# Patient Record
Sex: Male | Born: 1955 | Race: Black or African American | Hispanic: No | Marital: Single | Smoking: Former smoker
Health system: Southern US, Community
[De-identification: ages and names within clinical notes are randomized; demographics above are authoritative.]

## PROBLEM LIST (undated history)

## (undated) DIAGNOSIS — K769 Liver disease, unspecified: Secondary | ICD-10-CM

## (undated) DIAGNOSIS — R569 Unspecified convulsions: Secondary | ICD-10-CM

## (undated) DIAGNOSIS — K219 Gastro-esophageal reflux disease without esophagitis: Secondary | ICD-10-CM

## (undated) DIAGNOSIS — F509 Eating disorder, unspecified: Secondary | ICD-10-CM

## (undated) DIAGNOSIS — G4730 Sleep apnea, unspecified: Secondary | ICD-10-CM

## (undated) DIAGNOSIS — J449 Chronic obstructive pulmonary disease, unspecified: Secondary | ICD-10-CM

## (undated) HISTORY — DX: Sleep apnea, unspecified: G47.30

## (undated) HISTORY — DX: Gastro-esophageal reflux disease without esophagitis: K21.9

## (undated) HISTORY — DX: Chronic obstructive pulmonary disease, unspecified: J44.9

## (undated) HISTORY — PX: SKIN BIOPSY: SHX1

## (undated) HISTORY — DX: Eating disorder, unspecified: F50.9

## (undated) HISTORY — DX: Unspecified convulsions: R56.9

## (undated) HISTORY — DX: Liver disease, unspecified: K76.9

---

## 2000-07-11 ENCOUNTER — Encounter: Payer: Self-pay | Admitting: Gastroenterology

## 2000-07-11 ENCOUNTER — Encounter: Admission: RE | Admit: 2000-07-11 | Discharge: 2000-07-11 | Payer: Self-pay | Admitting: Gastroenterology

## 2007-02-05 ENCOUNTER — Other Ambulatory Visit: Payer: Self-pay

## 2007-02-05 ENCOUNTER — Emergency Department: Payer: Self-pay | Admitting: Emergency Medicine

## 2007-08-22 ENCOUNTER — Emergency Department: Payer: Self-pay | Admitting: Emergency Medicine

## 2007-12-18 ENCOUNTER — Ambulatory Visit: Payer: Self-pay | Admitting: Neurology

## 2013-11-03 ENCOUNTER — Encounter: Payer: Self-pay | Admitting: *Deleted

## 2013-11-16 ENCOUNTER — Ambulatory Visit: Payer: Self-pay | Admitting: General Surgery

## 2013-11-24 ENCOUNTER — Ambulatory Visit (INDEPENDENT_AMBULATORY_CARE_PROVIDER_SITE_OTHER): Payer: Medicaid Other | Admitting: General Surgery

## 2013-11-24 ENCOUNTER — Encounter: Payer: Self-pay | Admitting: General Surgery

## 2013-11-24 VITALS — BP 140/78 | HR 84 | Resp 12 | Ht 66.0 in | Wt 142.0 lb

## 2013-11-24 DIAGNOSIS — L723 Sebaceous cyst: Secondary | ICD-10-CM

## 2013-11-24 DIAGNOSIS — L729 Follicular cyst of the skin and subcutaneous tissue, unspecified: Secondary | ICD-10-CM

## 2013-11-24 NOTE — Progress Notes (Signed)
Patient ID: Jeremiah Nguyen, male   DOB: 08/24/1955, 59 y.o.   MRN: [id number]  Chief Complaint  Patient presents with  . Other    Evaluation of left leg cyst    HPI Jeremiah Nguyen is a 58 y.o. male who presents for an evaluation of a left leg cyst. The patient states the cyst has been present for approximately 1-2 years. It was drained approximately 1 year ago. He denies any pain in this location. The area has gotten just as large as it was prior to it being drained 1 year ago.   HPI  Past Medical History  Diagnosis Date  . GERD (gastroesophageal reflux disease)   . Liver disease   . Seizures     History reviewed. No pertinent past surgical history.  Family History  Problem Relation Age of Onset  . Cancer Mother     breast    Social History History  Substance Use Topics  . Smoking status: Current Every Day Smoker -- 1.00 packs/day for 30 years  . Smokeless tobacco: Never Used  . Alcohol Use: No    No Known Allergies  Current Outpatient Prescriptions  Medication Sig Dispense Refill  . acetaminophen (TYLENOL) 325 MG tablet Take 325 mg by mouth every 4 (four) hours as needed.      . baclofen (LIORESAL) 10 MG tablet Take 10 mg by mouth 2 (two) times daily.      Marland Kitchen buPROPion (WELLBUTRIN XL) 150 MG 24 hr tablet Take 150 mg by mouth daily.      . chlorproMAZINE (THORAZINE) 50 MG tablet Take 50 mg by mouth 4 (four) times daily as needed.      Marland Kitchen omeprazole (PRILOSEC) 20 MG capsule Take 20 mg by mouth 2 (two) times daily before a meal.      . PHENobarbital (LUMINAL) 64.8 MG tablet Take 64.8 mg by mouth at bedtime.      . phenytoin (DILANTIN) 100 MG ER capsule Take 200 mg by mouth 2 (two) times daily.      . sennosides-docusate sodium (SENOKOT-S) 8.6-50 MG tablet Take 1 tablet by mouth 2 (two) times daily.       No current facility-administered medications for this visit.    Review of Systems Review of Systems  Constitutional: Negative.   Respiratory: Negative.     Cardiovascular: Negative.     Blood pressure 140/78, pulse 84, resp. rate 12, height 5\' 6"  (1.676 m), weight 142 lb (64.411 kg).  Physical Exam Physical Exam  Constitutional: He is oriented to person, place, and time. He appears well-developed and well-nourished. No distress.  Neck: Neck supple.  Cardiovascular: Normal rate and regular rhythm.   Pulmonary/Chest: Effort normal and breath sounds normal. No respiratory distress.  Musculoskeletal:       Legs: Neurological: He is alert and oriented to person, place, and time.    Data Reviewed PCP notes  Assessment    Recurring and enlarging skin cyst in lateral aspect of L knee.       Plan    Recommended excision.  May need developing skin flaps to allow adequate closer.  Discussed with patient and his care giver.  They are agreeable.        Jenavie Stanczak G 11/25/2013, 6:15 AM

## 2013-11-25 ENCOUNTER — Encounter: Payer: Self-pay | Admitting: General Surgery

## 2013-11-25 ENCOUNTER — Telehealth: Payer: Self-pay | Admitting: *Deleted

## 2013-11-25 NOTE — Patient Instructions (Signed)
Excision of skin cyst to be done in SDS

## 2013-11-25 NOTE — Telephone Encounter (Signed)
Patient's caregiver, Junious Dresser, called to arrange surgery date for the patient. This patient's surgery has been scheduled for 12-15-13 at [facility]. Junious Dresser was instructed to call the office if they have further questions.   Caregiver reports that patient has no POA and signs all paperwork for himself.

## 2013-11-26 ENCOUNTER — Other Ambulatory Visit: Payer: Self-pay | Admitting: General Surgery

## 2013-11-26 DIAGNOSIS — L729 Follicular cyst of the skin and subcutaneous tissue, unspecified: Secondary | ICD-10-CM

## 2013-12-08 ENCOUNTER — Ambulatory Visit: Payer: Self-pay | Admitting: General Surgery

## 2013-12-08 LAB — CBC WITH DIFFERENTIAL/PLATELET
BASOS ABS: 0.1 10*3/uL (ref 0.0–0.1)
Basophil %: 1.5 %
EOS ABS: 0.3 10*3/uL (ref 0.0–0.7)
Eosinophil %: 4.4 %
HCT: 44.5 % (ref 40.0–52.0)
HGB: 14.3 g/dL (ref 13.0–18.0)
Lymphocyte #: 1.4 10*3/uL (ref 1.0–3.6)
Lymphocyte %: 23.9 %
MCH: 30.4 pg (ref 26.0–34.0)
MCHC: 32.2 g/dL (ref 32.0–36.0)
MCV: 94 fL (ref 80–100)
MONOS PCT: 7.7 %
Monocyte #: 0.5 x10 3/mm (ref 0.2–1.0)
NEUTROS PCT: 62.5 %
Neutrophil #: 3.7 10*3/uL (ref 1.4–6.5)
PLATELETS: 237 10*3/uL (ref 150–440)
RBC: 4.71 10*6/uL (ref 4.40–5.90)
RDW: 14.8 % — ABNORMAL HIGH (ref 11.5–14.5)
WBC: 5.9 10*3/uL (ref 3.8–10.6)

## 2013-12-15 ENCOUNTER — Encounter: Payer: Self-pay | Admitting: General Surgery

## 2013-12-15 ENCOUNTER — Ambulatory Visit: Payer: Self-pay | Admitting: General Surgery

## 2013-12-15 DIAGNOSIS — L723 Sebaceous cyst: Secondary | ICD-10-CM

## 2013-12-15 HISTORY — PX: LESION EXCISION: SHX5167

## 2013-12-17 LAB — PATHOLOGY REPORT

## 2013-12-21 ENCOUNTER — Encounter: Payer: Self-pay | Admitting: General Surgery

## 2013-12-29 ENCOUNTER — Ambulatory Visit: Payer: Medicaid Other | Admitting: General Surgery

## 2014-01-06 ENCOUNTER — Encounter: Payer: Self-pay | Admitting: General Surgery

## 2014-01-06 ENCOUNTER — Ambulatory Visit (INDEPENDENT_AMBULATORY_CARE_PROVIDER_SITE_OTHER): Payer: Self-pay | Admitting: General Surgery

## 2014-01-06 VITALS — BP 136/66 | HR 88 | Resp 16 | Ht 66.0 in | Wt 144.0 lb

## 2014-01-06 DIAGNOSIS — L723 Sebaceous cyst: Secondary | ICD-10-CM

## 2014-01-06 DIAGNOSIS — L729 Follicular cyst of the skin and subcutaneous tissue, unspecified: Secondary | ICD-10-CM

## 2014-01-06 NOTE — Progress Notes (Signed)
This is a 58 year old male here today for his post op left leg excision done on 12/15/13. Patient states he is doing well. Incision healing well. Sutures removed. Steri strips applied. Pathology report, benign follicular cyst.

## 2014-01-06 NOTE — Patient Instructions (Signed)
As needed

## 2014-01-08 ENCOUNTER — Encounter: Payer: Self-pay | Admitting: General Surgery

## 2014-09-11 NOTE — Op Note (Signed)
PATIENT NAME:  Jeremiah Nguyen, Jeremiah Nguyen MR#:  [id number] DATE OF BIRTH:  03/24/1956  DATE OF PROCEDURE:  12/15/2013  PREOPERATIVE DIAGNOSIS: Large skin cyst, left leg, just below the knee, lateral.   POSTOPERATIVE DIAGNOSIS: Large skin cyst, left leg, just below the knee, lateral.   PROCEDURE: Excision of large skin cyst with skin flaps and closure.   SURGEON: Mckinley Jewel, M.D.   ANESTHESIA: Monitored care, local anesthetic of 0.5% Marcaine mixed with 1% Xylocaine,  total of 13 mL.   COMPLICATIONS: None.   ESTIMATED BLOOD LOSS: Minimal.   DRAINS: None.   DESCRIPTION OF PROCEDURE: This patient was placed in the supine position on the operating room table and with adequate sedation and monitoring he was positioned with a wedge on the back and tilted to the right side. The left knee and upper leg area were then prepped and draped out as a sterile field. The large skin cyst measured about 3.5 to 4 cm size and was fairly tense given the location being just over the fibula region. A vertical elliptical incision was mapped out. Timeout was performed. The area was infiltrated with local anesthetic all around to obtain an adequate block and to allow for skin flaps. The incision was then made and the entire cystic structure was excised out completely from the underlying fascia. The patient had a little more subcutaneous tissue posteriorly and a little anteriorly. After the lesion was excised out, it left a defect of about 5 cm long and about 3 cm in anteroposterior direction. The skin and subcutaneous tissue were then elevated on both sides for about 3 to 4 cm using cautery to control bleeding. This was adequately mobilized on both sides. The skin edges could be brought together with the least amount of tension. Subcutaneous tissue was then closed with interrupted 2-0 Vicryl stitches, and the skin was approximated with interrupted vertical sutures of 4-0 nylon. Bacitracin ointment, Telfa, dry gauze and Kling were  applied. The patient subsequently was returned to the recovery room in stable condition.    ____________________________ S.Robinette Haines, MD sgs:sb D: 12/15/2013 08:50:25 ET T: 12/15/2013 09:16:23 ET JOB#: [id number]  cc: Synthia Innocent. Jamal Collin, MD, <Dictator> [facility] Robinette Haines MD ELECTRONICALLY SIGNED 12/16/2013 11:02

## 2014-11-25 ENCOUNTER — Other Ambulatory Visit: Payer: Self-pay

## 2014-11-25 ENCOUNTER — Encounter: Payer: Self-pay | Admitting: *Deleted

## 2014-11-25 ENCOUNTER — Inpatient Hospital Stay
Admission: EM | Admit: 2014-11-25 | Discharge: 2014-11-27 | DRG: 871 | Disposition: A | Discharge status: Home / Self Care | Payer: Medicaid Other | Attending: Internal Medicine | Admitting: Internal Medicine

## 2014-11-25 ENCOUNTER — Emergency Department: Payer: Medicaid Other

## 2014-11-25 DIAGNOSIS — T380X5A Adverse effect of glucocorticoids and synthetic analogues, initial encounter: Secondary | ICD-10-CM | POA: Diagnosis present

## 2014-11-25 DIAGNOSIS — A419 Sepsis, unspecified organism: Secondary | ICD-10-CM | POA: Diagnosis not present

## 2014-11-25 DIAGNOSIS — Z79899 Other long term (current) drug therapy: Secondary | ICD-10-CM

## 2014-11-25 DIAGNOSIS — R740 Nonspecific elevation of levels of transaminase and lactic acid dehydrogenase [LDH]: Secondary | ICD-10-CM | POA: Diagnosis present

## 2014-11-25 DIAGNOSIS — J9601 Acute respiratory failure with hypoxia: Secondary | ICD-10-CM | POA: Diagnosis present

## 2014-11-25 DIAGNOSIS — K769 Liver disease, unspecified: Secondary | ICD-10-CM | POA: Diagnosis present

## 2014-11-25 DIAGNOSIS — Z720 Tobacco use: Secondary | ICD-10-CM | POA: Diagnosis present

## 2014-11-25 DIAGNOSIS — G40909 Epilepsy, unspecified, not intractable, without status epilepticus: Secondary | ICD-10-CM | POA: Diagnosis present

## 2014-11-25 DIAGNOSIS — J441 Chronic obstructive pulmonary disease with (acute) exacerbation: Secondary | ICD-10-CM | POA: Diagnosis present

## 2014-11-25 DIAGNOSIS — K219 Gastro-esophageal reflux disease without esophagitis: Secondary | ICD-10-CM | POA: Diagnosis present

## 2014-11-25 DIAGNOSIS — F329 Major depressive disorder, single episode, unspecified: Secondary | ICD-10-CM | POA: Diagnosis present

## 2014-11-25 DIAGNOSIS — F17210 Nicotine dependence, cigarettes, uncomplicated: Secondary | ICD-10-CM | POA: Diagnosis present

## 2014-11-25 DIAGNOSIS — Z716 Tobacco abuse counseling: Secondary | ICD-10-CM | POA: Diagnosis present

## 2014-11-25 DIAGNOSIS — J189 Pneumonia, unspecified organism: Secondary | ICD-10-CM

## 2014-11-25 HISTORY — DX: Sepsis, unspecified organism: A41.9

## 2014-11-25 LAB — URINALYSIS COMPLETE WITH MICROSCOPIC (ARMC ONLY)
Bacteria, UA: NONE SEEN
Bilirubin Urine: NEGATIVE
Glucose, UA: NEGATIVE mg/dL
LEUKOCYTES UA: NEGATIVE
Nitrite: NEGATIVE
PH: 5 (ref 5.0–8.0)
Protein, ur: 30 mg/dL — AB
Specific Gravity, Urine: 1.017 (ref 1.005–1.030)

## 2014-11-25 LAB — COMPREHENSIVE METABOLIC PANEL
ALT: 135 U/L — ABNORMAL HIGH (ref 17–63)
ANION GAP: 14 (ref 5–15)
AST: 150 U/L — ABNORMAL HIGH (ref 15–41)
Albumin: 3.7 g/dL (ref 3.5–5.0)
Alkaline Phosphatase: 151 U/L — ABNORMAL HIGH (ref 38–126)
BILIRUBIN TOTAL: 1.1 mg/dL (ref 0.3–1.2)
BUN: 11 mg/dL (ref 6–20)
CHLORIDE: 95 mmol/L — AB (ref 101–111)
CO2: 28 mmol/L (ref 22–32)
Calcium: 9.4 mg/dL (ref 8.9–10.3)
Creatinine, Ser: 0.93 mg/dL (ref 0.61–1.24)
GFR calc non Af Amer: >60 mL/min (ref >60)
Glucose, Bld: 97 mg/dL (ref 65–99)
POTASSIUM: 3.7 mmol/L (ref 3.5–5.1)
Sodium: 137 mmol/L (ref 135–145)
Total Protein: 9.2 g/dL — ABNORMAL HIGH (ref 6.5–8.1)

## 2014-11-25 LAB — CBC WITH DIFFERENTIAL/PLATELET
BASOS ABS: 0 10*3/uL (ref 0–0.1)
BASOS PCT: 0 %
Eosinophils Absolute: 0 10*3/uL (ref 0–0.7)
Eosinophils Relative: 0 %
HCT: 51.5 % (ref 40.0–52.0)
Hemoglobin: 17.2 g/dL (ref 13.0–18.0)
LYMPHS PCT: 6 %
Lymphs Abs: 1 10*3/uL (ref 1.0–3.6)
MCH: 29.7 pg (ref 26.0–34.0)
MCHC: 33.4 g/dL (ref 32.0–36.0)
MCV: 89 fL (ref 80.0–100.0)
Monocytes Absolute: 1.3 10*3/uL — ABNORMAL HIGH (ref 0.2–1.0)
Monocytes Relative: 8 %
NEUTROS ABS: 14 10*3/uL — AB (ref 1.4–6.5)
NEUTROS PCT: 86 %
Platelets: 388 10*3/uL (ref 150–440)
RBC: 5.79 MIL/uL (ref 4.40–5.90)
RDW: 14.9 % — AB (ref 11.5–14.5)
WBC: 16.4 10*3/uL — ABNORMAL HIGH (ref 3.8–10.6)

## 2014-11-25 LAB — LACTIC ACID, PLASMA
Lactic Acid, Venous: 1.5 mmol/L (ref 0.5–2.0)
Lactic Acid, Venous: 1.3 mmol/L (ref 0.5–2.0)

## 2014-11-25 LAB — ACETAMINOPHEN LEVEL: Acetaminophen (Tylenol), Serum: <10 ug/mL — ABNORMAL LOW (ref 10–30)

## 2014-11-25 MED ORDER — ACETAMINOPHEN 500 MG PO TABS
1000.0000 mg | ORAL_TABLET | ORAL | Status: AC
  Administered 2014-11-25: 1000 mg via ORAL
  Filled 2014-11-25: qty 2

## 2014-11-25 MED ORDER — BACLOFEN 10 MG PO TABS
10.0000 mg | ORAL_TABLET | Freq: Two times a day (BID) | ORAL | Status: DC
  Administered 2014-11-25 – 2014-11-27 (×4): 10 mg via ORAL
  Filled 2014-11-25 (×4): qty 1

## 2014-11-25 MED ORDER — IPRATROPIUM-ALBUTEROL 0.5-2.5 (3) MG/3ML IN SOLN
3.0000 mL | Freq: Four times a day (QID) | RESPIRATORY_TRACT | Status: DC
  Administered 2014-11-25 – 2014-11-27 (×6): 3 mL via RESPIRATORY_TRACT
  Filled 2014-11-25 (×8): qty 3

## 2014-11-25 MED ORDER — CEFTRIAXONE SODIUM IN DEXTROSE 20 MG/ML IV SOLN
1.0000 g | INTRAVENOUS | Status: DC
  Administered 2014-11-26: 1 g via INTRAVENOUS
  Filled 2014-11-25: qty 50

## 2014-11-25 MED ORDER — SENNOSIDES-DOCUSATE SODIUM 8.6-50 MG PO TABS
1.0000 | ORAL_TABLET | Freq: Two times a day (BID) | ORAL | Status: DC
  Administered 2014-11-25 – 2014-11-27 (×4): 1 via ORAL
  Filled 2014-11-25 (×5): qty 1

## 2014-11-25 MED ORDER — SODIUM CHLORIDE 0.9 % IV BOLUS (SEPSIS)
1000.0000 mL | Freq: Once | INTRAVENOUS | Status: AC
  Administered 2014-11-25: 1000 mL via INTRAVENOUS

## 2014-11-25 MED ORDER — PREDNISONE 50 MG PO TABS
50.0000 mg | ORAL_TABLET | Freq: Every day | ORAL | Status: DC
  Administered 2014-11-26 – 2014-11-27 (×2): 50 mg via ORAL
  Filled 2014-11-25 (×2): qty 1

## 2014-11-25 MED ORDER — DEXTROSE 5 % IV SOLN
500.0000 mg | Freq: Once | INTRAVENOUS | Status: AC
  Administered 2014-11-25: 500 mg via INTRAVENOUS
  Filled 2014-11-25: qty 500

## 2014-11-25 MED ORDER — CEFTRIAXONE SODIUM IN DEXTROSE 40 MG/ML IV SOLN
2.0000 g | Freq: Once | INTRAVENOUS | Status: AC
  Administered 2014-11-25: 2 g via INTRAVENOUS
  Filled 2014-11-25 (×2): qty 50

## 2014-11-25 MED ORDER — BUPROPION HCL ER (XL) 150 MG PO TB24
150.0000 mg | ORAL_TABLET | Freq: Every day | ORAL | Status: DC
  Administered 2014-11-26 – 2014-11-27 (×2): 150 mg via ORAL
  Filled 2014-11-25 (×3): qty 1

## 2014-11-25 MED ORDER — HEPARIN SODIUM (PORCINE) 5000 UNIT/ML IJ SOLN
5000.0000 [IU] | Freq: Three times a day (TID) | INTRAMUSCULAR | Status: DC
  Administered 2014-11-25 – 2014-11-27 (×5): 5000 [IU] via SUBCUTANEOUS
  Filled 2014-11-25 (×5): qty 1

## 2014-11-25 MED ORDER — PANTOPRAZOLE SODIUM 40 MG PO TBEC
40.0000 mg | DELAYED_RELEASE_TABLET | Freq: Every day | ORAL | Status: DC
  Administered 2014-11-25 – 2014-11-27 (×3): 40 mg via ORAL
  Filled 2014-11-25 (×3): qty 1

## 2014-11-25 MED ORDER — ACETAMINOPHEN 325 MG PO TABS: 650.0000 mg | ORAL_TABLET | Freq: Four times a day (QID) | ORAL | Status: DC | PRN

## 2014-11-25 MED ORDER — CEFTRIAXONE SODIUM IN DEXTROSE 20 MG/ML IV SOLN: 1.0000 g | Freq: Once | INTRAVENOUS | Status: DC

## 2014-11-25 MED ORDER — ACETAMINOPHEN 650 MG RE SUPP: 650.0000 mg | Freq: Four times a day (QID) | RECTAL | Status: DC | PRN

## 2014-11-25 MED ORDER — PHENOBARBITAL 32.4 MG PO TABS
64.8000 mg | ORAL_TABLET | Freq: Every day | ORAL | Status: DC
  Administered 2014-11-25 – 2014-11-26 (×2): 64.8 mg via ORAL
  Filled 2014-11-25: qty 1
  Filled 2014-11-25 (×2): qty 2

## 2014-11-25 MED ORDER — DEXTROSE 5 % IV SOLN
500.0000 mg | INTRAVENOUS | Status: DC
  Administered 2014-11-26: 500 mg via INTRAVENOUS
  Filled 2014-11-25 (×2): qty 500

## 2014-11-25 MED ORDER — MAGNESIUM HYDROXIDE 400 MG/5ML PO SUSP: 30.0000 mL | Freq: Every day | ORAL | Status: DC | PRN

## 2014-11-25 MED ORDER — IPRATROPIUM-ALBUTEROL 0.5-2.5 (3) MG/3ML IN SOLN
3.0000 mL | Freq: Once | RESPIRATORY_TRACT | Status: AC
  Administered 2014-11-25: 3 mL via RESPIRATORY_TRACT
  Filled 2014-11-25: qty 3

## 2014-11-25 MED ORDER — METHYLPREDNISOLONE SODIUM SUCC 125 MG IJ SOLR
125.0000 mg | INTRAMUSCULAR | Status: AC
  Administered 2014-11-25: 125 mg via INTRAVENOUS
  Filled 2014-11-25: qty 2

## 2014-11-25 MED ORDER — PHENYTOIN SODIUM EXTENDED 100 MG PO CAPS
200.0000 mg | ORAL_CAPSULE | Freq: Two times a day (BID) | ORAL | Status: DC
  Administered 2014-11-25 – 2014-11-27 (×4): 200 mg via ORAL
  Filled 2014-11-25 (×4): qty 2

## 2014-11-25 NOTE — ED Provider Notes (Signed)
[facility] Emergency Department Provider Note  ____________________________________________  Time seen: Approximately 4:19 PM  I have reviewed the triage vital signs and the nursing notes.   HISTORY  Chief Complaint Shortness of Breath    HPI Jeremiah Nguyen is a 59 y.o. male the history of seizure disorder and ongoing smoking. Per the patient and his cousin who is also his caregiver, he is been experiencing a fever since Friday. They discussed with Dr. Brynda Greathouse and his recommendation of come to the emergency room today because he is now feeling short of breath, having trouble breathing, is having fevers with a very productive cough.  He has not been any recent antibiotic. He has not a recent hospitalizations. He denies being in pain though he does feel short of breath, this improves when he is on oxygen.  Moderate shortness of breath. Associated fever.  No abdominal pain. No rash. No headache. No neck pain. No chest pain.  Past Medical History  Diagnosis Date  . GERD (gastroesophageal reflux disease)   . Liver disease   . Seizures     There are no active problems to display for this patient.   Past Surgical History  Procedure Laterality Date  . Lesion excision Left 5-36-14    follicular cyst  . Skin biopsy      Current Outpatient Rx  Name  Route  Sig  Dispense  Refill  . acetaminophen (TYLENOL) 325 MG tablet   Oral   Take 325 mg by mouth every 4 (four) hours as needed.         . baclofen (LIORESAL) 10 MG tablet   Oral   Take 10 mg by mouth 2 (two) times daily.         Marland Kitchen buPROPion (WELLBUTRIN XL) 150 MG 24 hr tablet   Oral   Take 150 mg by mouth daily.         . chlorproMAZINE (THORAZINE) 50 MG tablet   Oral   Take 50 mg by mouth 4 (four) times daily as needed.         Marland Kitchen omeprazole (PRILOSEC) 20 MG capsule   Oral   Take 20 mg by mouth 2 (two) times daily before a meal.         . PHENobarbital (LUMINAL) 64.8 MG tablet    Oral   Take 64.8 mg by mouth at bedtime.         . phenytoin (DILANTIN) 100 MG ER capsule   Oral   Take 200 mg by mouth 2 (two) times daily.         . sennosides-docusate sodium (SENOKOT-S) 8.6-50 MG tablet   Oral   Take 1 tablet by mouth 2 (two) times daily.           Allergies Review of patient's allergies indicates no known allergies.  Family History  Problem Relation Age of Onset  . Cancer Mother     breast    Social History History  Substance Use Topics  . Smoking status: Current Every Day Smoker -- 1.00 packs/day for 30 years  . Smokeless tobacco: Never Used  . Alcohol Use: No    Review of Systems Constitutional: See history of present illness Eyes: No visual changes. ENT: No sore throat. Cardiovascular: Denies chest pain. Respiratory: See history of present illness Gastrointestinal: No abdominal pain.  No nausea, no vomiting.  No diarrhea.  No constipation. Genitourinary: Negative for dysuria. Musculoskeletal: Negative for back pain. Skin: Negative for rash. Neurological: Negative for  headaches, focal weakness or numbness.  10-point ROS otherwise negative.  ____________________________________________   PHYSICAL EXAM:  VITAL SIGNS: ED Triage Vitals  Enc Vitals Group     BP 11/25/14 1559 128/71 mmHg     Pulse Rate 11/25/14 1559 129     Resp --      Temp 11/25/14 1559 102.9 F (39.4 C)     Temp Source 11/25/14 1559 Oral     SpO2 11/25/14 1559 84 %     Weight 11/25/14 1559 134 lb (60.782 kg)     Height 11/25/14 1559 5\' 6"  (1.676 m)     Head Cir --      Peak Flow --      Pain Score --      Pain Loc --      Pain Edu? --      Excl. in [place]? --     Constitutional: Alert and oriented. Moderately ill-appearing and slightly tachypnea. Eyes: Conjunctivae are normal. PERRL. EOMI. Head: Atraumatic. Nose: No congestion/rhinnorhea. Mouth/Throat: Mucous membranes are dry.  Oropharynx non-erythematous. Neck: No stridor.   Cardiovascular: Regular  but tachycardic Grossly normal heart sounds.  Good peripheral circulation. Respiratory: Slight tachypnea, increased work of breathing, faint wheezes throughout. Patient does display moderate increased work of breathing and mild use of accessory muscles. He does not appear to be fatigued. Gastrointestinal: Soft and nontender. No distention. No abdominal bruits. No CVA tenderness. Musculoskeletal: No lower extremity tenderness nor edema.  No joint effusions. Neurologic:  Normal speech and language. No gross focal neurologic deficits are appreciated. Speech is normal. No gait instability. Skin:  Skin is warm, dry and intact. No rash noted. Psychiatric: Mood and affect are normal. Speech and behavior are normal.  ____________________________________________   LABS (all labs ordered are listed, but only abnormal results are displayed)  Labs Reviewed  COMPREHENSIVE METABOLIC PANEL - Abnormal; Notable for the following:    Chloride 95 (*)    Total Protein 9.2 (*)    AST 150 (*)    ALT 135 (*)    Alkaline Phosphatase 151 (*)    All other components within normal limits  CBC WITH DIFFERENTIAL/PLATELET - Abnormal; Notable for the following:    WBC 16.4 (*)    RDW 14.9 (*)    Neutro Abs 14.0 (*)    Monocytes Absolute 1.3 (*)    All other components within normal limits  CULTURE, BLOOD (ROUTINE X 2)  CULTURE, BLOOD (ROUTINE X 2)  URINE CULTURE  LACTIC ACID, PLASMA  LACTIC ACID, PLASMA  URINALYSIS COMPLETEWITH MICROSCOPIC ([facility] ONLY)   ____________________________________________  EKG  ED ECG REPORT I, QUALE, MARK, the attending physician, personally viewed and interpreted this ECG.  Date: 11/25/2014 EKG Time: 1620 Rate: 125 Rhythm: Sinus tachycardia QRS Axis: normal Intervals: normal ST/T Wave abnormalities: normal Conduction Disutrbances: none Narrative Interpretation: tachycardia with a left anterior fascicular block, no acute ischemic  changes  ____________________________________________  RADIOLOGY  Final result by Rad Results In Interface (11/25/14 16:43:39)   Narrative:   CLINICAL DATA: Smoker with current history of COPD, presenting with recent onset of shortness of breath and fever.  EXAM: PORTABLE CHEST - 1 VIEW  COMPARISON: None.  FINDINGS: Cardiac silhouette mildly enlarged. Thoracic aorta tortuous and mildly atherosclerotic. Mildly prominent central pulmonary arteries. Patchy airspace opacities in both lung bases, left greater than right, and in the right upper lobe. No visible pleural effusions. No pneumothorax. Emphysematous changes in the upper lobes.  IMPRESSION: 1. Patchy pneumonia involving both lung bases left  greater than right, and involving the right upper lobe. 2. Cardiomegaly without evidence of pulmonary edema.    ____________________________________________   PROCEDURES  Procedure(s) performed: None  Critical Care performed: Yes, see critical care note(s)  CRITICAL CARE Performed by: Delman Kitten   Total critical care time: 35  Critical care time was exclusive of separately billable procedures and treating other patients.  Critical care was necessary to treat or prevent imminent or life-threatening deterioration.  Critical care was time spent personally by me on the following activities: development of treatment plan with patient and/or surrogate as well as nursing, discussions with consultants, evaluation of patient's response to treatment, examination of patient, obtaining history from patient or surrogate, ordering and performing treatments and interventions, ordering and review of laboratory studies, ordering and review of radiographic studies, pulse oximetry and re-evaluation of patient's condition.  Patient presents with signs and symptoms concerning for acute sepsis requiring emergent evaluation and early goal directed therapy including initiation of antibiotic's,  fluid resuscitation. ____________________________________________   INITIAL IMPRESSION / ASSESSMENT AND PLAN / ED COURSE  Pertinent labs & imaging results that were available during my care of the patient were reviewed by me and considered in my medical decision making (see chart for details).  Patient presents with respiratory symptoms, productive cough, fever and signs and symptoms concerning for possible sepsis. His EKG is reassuring he has no chest pain. The evaluation does not show any other source based on physical examination though I suspect he likely has some type of pneumonia or other acute respiratory infection. He is not having any risk factors for healthcare associated pneumonia. Based on his presentation I will initiate early antibiotic therapy as well as early goal directed therapy.  ----------------------------------------- 5:50 PM on 11/25/2014 -----------------------------------------  Hheart rate and work of breathing has improved. He is awake and alert, oxygen saturation is 93% on nasal cannula supplementation.  Discussed case with medicine service, Dr. Volanda Napoleon. Patient will be admitted to medical service. ____________________________________________   FINAL CLINICAL IMPRESSION(S) / ED DIAGNOSES  Final diagnoses:  Bilateral pneumonia  Sepsis, due to unspecified organism      Delman Kitten, MD 11/25/14 1751

## 2014-11-25 NOTE — ED Notes (Signed)
Pt ambulatory to triage.  Pt sent to er for eval of copd/short of breath/fever from dr Brynda Greathouse office.  Pt denies chest pain.  Pt from my family care home.  Pt alert.
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
## 2014-11-25 NOTE — Progress Notes (Signed)
ANTIBIOTIC CONSULT NOTE - INITIAL  Pharmacy Consult for Ceftriaxone, Azithromycin Indication: pneumonia  No Known Allergies  Patient Measurements: Height: 5\' 10"  (177.8 cm) Weight: 135 lb 6.4 oz (61.417 kg) IBW/kg (Calculated) : 73 Adjusted Body Weight:   Vital Signs: Temp: 98.6 F (37 C) (07/07 1947) Temp Source: Oral (07/07 1947) BP: 114/64 mmHg (07/07 1947) Pulse Rate: 102 (07/07 1947) Intake/Output from previous day:   Intake/Output from this shift: Total I/O In: 1500 [I.V.:1500] Out: -   Labs:  Recent Labs  11/25/14 1656  WBC 16.4*  HGB 17.2  PLT 388  CREATININE 0.93   Estimated Creatinine Clearance: 73.4 mL/min (by C-G formula based on Cr of 0.93). No results for input(s): VANCOTROUGH, VANCOPEAK, VANCORANDOM, GENTTROUGH, GENTPEAK, GENTRANDOM, TOBRATROUGH, TOBRAPEAK, TOBRARND, AMIKACINPEAK, AMIKACINTROU, AMIKACIN in the last 72 hours.   Microbiology: No results found for this or any previous visit (from the past 720 hour(s)).  Medical History: Past Medical History  Diagnosis Date  . GERD (gastroesophageal reflux disease)   . Liver disease   . Seizures     Medications:  Prescriptions prior to admission  Medication Sig Dispense Refill Last Dose  . acetaminophen (TYLENOL) 325 MG tablet Take 325 mg by mouth every 4 (four) hours as needed.   Taking  . baclofen (LIORESAL) 10 MG tablet Take 10 mg by mouth 2 (two) times daily.   Taking  . buPROPion (WELLBUTRIN XL) 150 MG 24 hr tablet Take 150 mg by mouth daily.   Taking  . chlorproMAZINE (THORAZINE) 50 MG tablet Take 50 mg by mouth 4 (four) times daily as needed.   Taking  . omeprazole (PRILOSEC) 20 MG capsule Take 20 mg by mouth 2 (two) times daily before a meal.   Taking  . PHENobarbital (LUMINAL) 64.8 MG tablet Take 64.8 mg by mouth at bedtime.   Taking  . phenytoin (DILANTIN) 100 MG ER capsule Take 200 mg by mouth 2 (two) times daily.   Taking  . sennosides-docusate sodium (SENOKOT-S) 8.6-50 MG tablet Take  1 tablet by mouth 2 (two) times daily.   Taking   Assessment: CrCl = 71.6 ml/min  Goal of Therapy:  resolution of infection  Plan:  Expected duration 7 days with resolution of temperature and/or normalization of WBC  Ceftriaxone 2 gm IV X 1 given in ED on 7/7 .  Will order ceftriaxone 1 gm IV Q24H to start 7/8 @ 18:00. Azithromycin 500 mg IV X 1 given in ED on 7/7 .  Will order azithromycin to start on 7/8 @ 18:00.   Ramani Riva D 11/25/2014,7:53 PM

## 2014-11-25 NOTE — ED Notes (Signed)
Code  Sepsis  Called  To  [facility] 

## 2014-11-26 LAB — BASIC METABOLIC PANEL
Anion gap: 8 (ref 5–15)
BUN: 10 mg/dL (ref 6–20)
CO2: 30 mmol/L (ref 22–32)
CREATININE: 0.7 mg/dL (ref 0.61–1.24)
Calcium: 8.5 mg/dL — ABNORMAL LOW (ref 8.9–10.3)
Chloride: 102 mmol/L (ref 101–111)
GFR calc non Af Amer: >60 mL/min (ref >60)
Glucose, Bld: 93 mg/dL (ref 65–99)
Potassium: 3.6 mmol/L (ref 3.5–5.1)
Sodium: 140 mmol/L (ref 135–145)

## 2014-11-26 LAB — CBC
HCT: 42.1 % (ref 40.0–52.0)
HEMOGLOBIN: 13.8 g/dL (ref 13.0–18.0)
MCH: 29.3 pg (ref 26.0–34.0)
MCHC: 32.9 g/dL (ref 32.0–36.0)
MCV: 89.1 fL (ref 80.0–100.0)
Platelets: 348 10*3/uL (ref 150–440)
RBC: 4.73 MIL/uL (ref 4.40–5.90)
RDW: 14.7 % — AB (ref 11.5–14.5)
WBC: 19.6 10*3/uL — ABNORMAL HIGH (ref 3.8–10.6)

## 2014-11-26 LAB — HEPATIC FUNCTION PANEL
ALK PHOS: 104 U/L (ref 38–126)
ALT: 101 U/L — AB (ref 17–63)
AST: 95 U/L — AB (ref 15–41)
Albumin: 2.9 g/dL — ABNORMAL LOW (ref 3.5–5.0)
BILIRUBIN DIRECT: 0.3 mg/dL (ref 0.1–0.5)
BILIRUBIN INDIRECT: 0.4 mg/dL (ref 0.3–0.9)
TOTAL PROTEIN: 7.1 g/dL (ref 6.5–8.1)
Total Bilirubin: 0.7 mg/dL (ref 0.3–1.2)

## 2014-11-26 LAB — PHENYTOIN LEVEL, TOTAL

## 2014-11-26 NOTE — Progress Notes (Signed)
[facility] [place] NAME: Malik Paar    MR#:  [id number]  DATE OF BIRTH:  06-18-1955  SUBJECTIVE:  CHIEF COMPLAINT:   Chief Complaint  Patient presents with  . Shortness of Breath   Pt. Here due to sepsis from suspected pneumonia.  A bit confused this a.m.  No fever overnight.  WBC count a bit elevated.    REVIEW OF SYSTEMS:    Review of Systems  Constitutional: Negative for fever and chills.  HENT: Negative for congestion and tinnitus.   Eyes: Negative for blurred vision and double vision.  Respiratory: Negative for cough, shortness of breath and wheezing.   Cardiovascular: Negative for chest pain, orthopnea and PND.  Gastrointestinal: Negative for nausea, vomiting, abdominal pain and diarrhea.  Genitourinary: Negative for dysuria and hematuria.  Neurological: Negative for dizziness, sensory change and focal weakness.    Nutrition: Heart Healthy Tolerating Diet: Yes   DRUG ALLERGIES:  No Known Allergies  VITALS:  Blood pressure 128/71, pulse 95, temperature 98.6 F (37 C), temperature source Oral, resp. rate 18, height 5\' 10"  (1.778 m), weight 61.417 kg (135 lb 6.4 oz), SpO2 84 %.  PHYSICAL EXAMINATION:   Physical Exam  GENERAL:  59 y.o.-year-old patient lying in the bed with no acute distress.  EYES: Pupils equal, round, reactive to light and accommodation. No scleral icterus. Extraocular muscles intact.  HEENT: Head atraumatic, normocephalic. Oropharynx and nasopharynx clear.  NECK:  Supple, no jugular venous distention. No thyroid enlargement, no tenderness.  LUNGS: Normal breath sounds bilaterally, no wheezing, rales, rhonchi. No use of accessory muscles of respiration.  CARDIOVASCULAR: S1, S2 RRR. no murmurs, rubs, or gallops.  ABDOMEN: Soft, nontender, nondistended. Bowel sounds present. No organomegaly or mass.  EXTREMITIES: No cyanosis, clubbing or edema b/l.    NEUROLOGIC: Cranial nerves II through XII  are intact. No focal Motor or sensory deficits b/l.   PSYCHIATRIC: The patient is alert and oriented x 3. Good affect.  SKIN: No obvious rash, lesion, or ulcer.    LABORATORY PANEL:   CBC  Recent Labs Lab 11/26/14 0505  WBC 19.6*  HGB 13.8  HCT 42.1  PLT 348   ------------------------------------------------------------------------------------------------------------------  Chemistries   Recent Labs Lab 11/26/14 0505  NA 140  K 3.6  CL 102  CO2 30  GLUCOSE 93  BUN 10  CREATININE 0.70  CALCIUM 8.5*  AST 95*  ALT 101*  ALKPHOS 104  BILITOT 0.7   ------------------------------------------------------------------------------------------------------------------  Cardiac Enzymes No results for input(s): TROPONINI in the last 168 hours. ------------------------------------------------------------------------------------------------------------------  RADIOLOGY:  Dg Chest Port 1 View  11/25/2014   CLINICAL DATA:  Smoker with current history of COPD, presenting with recent onset of shortness of breath and fever.  EXAM: PORTABLE CHEST - 1 VIEW  COMPARISON:  None.  FINDINGS: Cardiac silhouette mildly enlarged. Thoracic aorta tortuous and mildly atherosclerotic. Mildly prominent central pulmonary arteries. Patchy airspace opacities in both lung bases, left greater than right, and in the right upper lobe. No visible pleural effusions. No pneumothorax. Emphysematous changes in the upper lobes.  IMPRESSION: 1. Patchy pneumonia involving both lung bases left greater than right, and involving the right upper lobe. 2. Cardiomegaly without evidence of pulmonary edema.   Electronically Signed   By: Evangeline Dakin M.D.   On: 11/25/2014 16:43     ASSESSMENT AND PLAN:   59 yo male w/ hx of GERD, Seizures, who presented to the hospital due to sepsis from pneumonia.   #  1 Sepsis - likely due to pneumonia as seen on CXR.  - pt. Presented w/ fever, tachycardia, leukocytosis and abnormal  CXR.  - cont. IV Ceftriaxone, Zithromax. BC (-) so far.  Afebrile, hemodynamically stable.   #2 Pneumonia - likely cause of pt's sepsis.  - cont. IV Ceftriaxone, Zithromax.  Follow cultures which are (-) so far.   #3 Leukocytosis - due to # 2 - follow w/ IV abx therapy.   #4 hx of seizures - no acute seizure type activity - cont. Phenobarb, Dilantin.    #5 Depression - cont. Wellbutrin.    #6 COPD Exacerbation - likely due to pneumonia.  - cont. Prednisone, duonebs, empiric abx for pneumonia.     All the records are reviewed and case discussed with Care Management/Social Workerr. Management plans discussed with the patient, family and they are in agreement.  CODE STATUS: Full  DVT Prophylaxis: Heparin SQ  TOTAL TIME TAKING CARE OF THIS PATIENT: 25 minutes.   POSSIBLE D/C IN 1-2 DAYS, DEPENDING ON CLINICAL CONDITION.   Henreitta Leber M.D on 11/26/2014 at 9:45 AM  Between 7am to 6pm - Pager - [phone]  After 6pm go to [email or web address] - password [email or web address] [facility]  Office  [phone]  CC: Primary care physician; Marden Noble, MD

## 2014-11-26 NOTE — Plan of Care (Signed)
Problem: Discharge Progression Outcomes Goal: Other Discharge Outcomes/Goals O2 sats >95% on 2L O2 per [place]. Afebrile. HR/BP stable. Ambulate to the bathroom with standby assist, tolerated well. Denies pain.

## 2014-11-26 NOTE — Plan of Care (Signed)
Problem: Discharge Progression Outcomes Goal: Discharge plan in place and appropriate Individualization of Care Pt prefers to be called Mr. Biskup Hx of Jerrye Bushy, Liver disease and seizures High Fall Precaution Goal: Other Discharge Outcomes/Goals Plan of Care Progress to Goal:  Pt impulsive, pulling off o2 at intervals, o2 remains at 2 lpm, pt without c/o pain, no s/s of distress.

## 2014-11-26 NOTE — Clinical Social Work Note (Signed)
Clinical Social Work Assessment  Patient Details  Name: Jeremiah Nguyen MRN: [id number] Date of Birth: Oct 17, 1955  Date of referral:  11/26/14               Reason for consult:  Facility Placement                Permission sought to share information with:  Other Permission granted to share information::  No  Name::      Reynold Bowen, administrator of [facility]   Housing/Transportation Living arrangements for the past 2 months:  [place] of Information:  Facility Production designer, theatre/television/film) Patient Interpreter Needed:  None Criminal Activity/Legal Involvement Pertinent to Current Situation/Hospitalization:  No - Comment as needed Significant Relationships:  Siblings Lives with:  Facility Resident Do you feel safe going back to the place where you live?  Yes Need for family participation in patient care:  Yes (Comment)  Care giving concerns:  No concerns identified.   Social Worker assessment / plan:  CSW spoke with Scientist, physiological of [place]. CSW introduced herself and explained role of social work. Pt has a sister is involved. Pt is able to return to [facility] when medically stable. CSW will continue to follow.   Employment status:  Disabled (Comment on whether or not currently receiving Disability) Insurance information:  Medicaid In [place] PT Recommendations:  Not assessed at this time Information / Referral to community resources:  Other (Comment Required) (facility)  Patient/Family's Response to care: Pt is confused.   Patient/Family's Understanding of and Emotional Response to Diagnosis, Current Treatment, and Prognosis:  The plan is for pt to return.   Emotional Assessment Appearance:  Appears stated age Attitude/Demeanor/Rapport:  Other (confused) Affect (typically observed):  Other (confused) Orientation:  Fluctuating Orientation (Suspected and/or reported Sundowners) Alcohol / Substance use:  Never Used Psych involvement (Current and /or in the  community):  No (Comment)  Discharge Needs  Concerns to be addressed:  No discharge needs identified Readmission within the last 30 days:  No Current discharge risk:  None Barriers to Discharge:  No Barriers Identified   Darden Dates, LCSW 11/26/2014, 4:24 PM

## 2014-11-26 NOTE — Progress Notes (Signed)
ANTIBIOTIC CONSULT NOTE - INITIAL  Pharmacy Consult for Ceftriaxone, Azithromycin Indication: pneumonia-CAP  No Known Allergies  Patient Measurements: Height: 5\' 10"  (177.8 cm) Weight: 135 lb 6.4 oz (61.417 kg) IBW/kg (Calculated) : 73 Adjusted Body Weight:   Vital Signs: Temp: 98.4 F (36.9 C) (07/08 1243) Temp Source: Oral (07/08 1243) BP: 133/82 mmHg (07/08 1243) Pulse Rate: 98 (07/08 1243) Intake/Output from previous day: 07/07 0701 - 07/08 0700 In: 1500 [I.V.:1500] Out: 450 [Urine:450] Intake/Output from this shift: Total I/O In: 240 [P.O.:240] Out: 350 [Urine:350]  Labs:  Recent Labs  11/25/14 1656 11/26/14 0505  WBC 16.4* 19.6*  HGB 17.2 13.8  PLT 388 348  CREATININE 0.93 0.70   Estimated Creatinine Clearance: 85.3 mL/min (by C-G formula based on Cr of 0.7).   Microbiology: Recent Results (from the past 720 hour(s))  Blood Culture (routine x 2)     Status: None (Preliminary result)   Collection Time: 11/25/14  4:56 PM  Result Value Ref Range Status   Specimen Description BLOOD RIGHT ASSIST CONTROL  Final   Special Requests NONE  Final   Culture NO GROWTH < 24 HOURS  Final   Report Status PENDING  Incomplete  Blood Culture (routine x 2)     Status: None (Preliminary result)   Collection Time: 11/25/14  4:57 PM  Result Value Ref Range Status   Specimen Description BLOOD LEFT ASSIST CONTROL  Final   Special Requests NONE  Final   Culture NO GROWTH < 24 HOURS  Final   Report Status PENDING  Incomplete  Urine culture     Status: None (Preliminary result)   Collection Time: 11/25/14  5:44 PM  Result Value Ref Range Status   Specimen Description URINE, RANDOM  Final   Special Requests NONE  Final   Culture NO GROWTH < 12 HOURS  Final   Report Status PENDING  Incomplete    Medical History: Past Medical History  Diagnosis Date  . GERD (gastroesophageal reflux disease)   . Liver disease   . Seizures     Medications:  Prescriptions prior to  admission  Medication Sig Dispense Refill Last Dose  . acetaminophen (TYLENOL) 325 MG tablet Take 325 mg by mouth every 4 (four) hours as needed.   Taking  . baclofen (LIORESAL) 10 MG tablet Take 10 mg by mouth 4 (four) times daily.    Taking  . buPROPion (WELLBUTRIN XL) 150 MG 24 hr tablet Take 150 mg by mouth daily.   Taking  . chlorproMAZINE (THORAZINE) 50 MG tablet Take 50 mg by mouth 4 (four) times daily as needed.   Taking  . omeprazole (PRILOSEC) 20 MG capsule Take 20 mg by mouth 2 (two) times daily before a meal.   Taking  . PHENobarbital (LUMINAL) 64.8 MG tablet Take 64.8 mg by mouth at bedtime.   Taking  . phenytoin (DILANTIN) 100 MG ER capsule Take 200 mg by mouth 2 (two) times daily.   Taking  . sennosides-docusate sodium (SENOKOT-S) 8.6-50 MG tablet Take 1 tablet by mouth 2 (two) times daily.   Taking  . traMADol (ULTRAM) 50 MG tablet Take 50 mg by mouth every 6 (six) hours as needed for moderate pain (Also taken for sleep at bedtime.). Take 1 tablet by mouth at bedtime as need for sleep and may take 1 tablet every 6 hours during the day for moderate pain.      Assessment: 59 yo with sepsis from CAP.   Goal of Therapy:  resolution of  infection  Plan:  Expected duration 7 days with resolution of temperature and/or normalization of WBC  Ceftriaxone 2 gm IV X 1 given in ED on 7/7 .   Will continue ceftriaxone 1 gm IV Q24H to start 7/8 @ 18:00. Azithromycin 500 mg IV X 1 given in ED on 7/7 .   Will continue azithromycin to start on 7/8 @ 18:00.   Chinita Greenland PharmD Clinical Pharmacist 11/26/2014

## 2014-11-26 NOTE — Progress Notes (Signed)
Initial Nutrition Assessment   INTERVENTION:  Meals and Snacks: Cater to patient preferences Medical Food Supplement Therapy: will recommend on follow if intake poor    NUTRITION DIAGNOSIS:  Inadequate oral intake related to acute illness as evidenced by per patient/family report.  GOAL:  Patient will meet greater than or equal to 90% of their needs  MONITOR:   (Energy Intake, Electrolyte and renal Profile, Pulmonary Profile, Anthropometerics)  REASON FOR ASSESSMENT:  Malnutrition Screening Tool    ASSESSMENT:  Pt admitted with sepsis secondary to pna and SOB. Pt receiving breathing treatment on visit.  PMHx:  Past Medical History  Diagnosis Date  . GERD (gastroesophageal reflux disease)   . Liver disease   . Seizures     Diet Order:  Diet Heart Room service appropriate?: Yes; Fluid consistency:: Thin  Current Nutrition: Per RN Tracie pt reported vomitting after eating at lunch today however no Nsg observed emesis. Per I/O chart pt ate 100% of breakfast this am.  Food/Nutrition-Related History: Per MST report, decreased appetite PTA   Medications: Prednisone, senokot, protonix  Electrolyte/Renal Profile and Glucose Profile:   Recent Labs Lab 11/25/14 1656 11/26/14 0505  NA 137 140  K 3.7 3.6  CL 95* 102  CO2 28 30  BUN 11 10  CREATININE 0.93 0.70  CALCIUM 9.4 8.5*  GLUCOSE 97 93   Protein Profile:  Recent Labs Lab 11/25/14 1656 11/26/14 0505  ALBUMIN 3.7 2.9*    Gastrointestinal Profile: Last BM: 7/7   Nutrition-Focused Physical Exam Findings:  Unable to complete Nutrition-Focused physical exam at this time.    Weight Change: Per [facility] weight loss of 6% in the past 11 months Anthropometrics:  Height:  Ht Readings from Last 1 Encounters:  11/25/14 5\' 10"  (1.778 m)    Weight:  Wt Readings from Last 1 Encounters:  11/25/14 135 lb 6.4 oz (61.417 kg)     Wt Readings from Last 10 Encounters:  11/25/14 135 lb 6.4 oz (61.417 kg)   01/06/14 144 lb (65.318 kg)  11/24/13 142 lb (64.411 kg)    BMI:  Body mass index is 19.43 kg/(m^2).   Estimated Energy Needs:  Total estimated energy needs: 1881-2223kcals, BEE: 1425kcals, TEE: (IF 1.1-1.3)(AF 1.2)   Total Estimated Protein Needs: 61-73g protein (1.0-1.2g/kg)   Total Estimated Fluid Needs: 1535-1837mL of fluid (25-5mL/kg)  Skin:  Reviewed, no issues  EDUCATION NEEDS:   No education needs identified at this time   Tift, RD, LDN Pager [phone]

## 2014-11-26 NOTE — Plan of Care (Signed)
Problem: Discharge Progression Outcomes Goal: Barriers To Progression Addressed/Resolved Individualization: Pt is a high fall risk. Standby assist. Offer toileting qx1hr with safety checks. Bed alarm activated. H/o GERD, seizure- controlled with meds.

## 2014-11-26 NOTE — Progress Notes (Signed)
Notified Dr Vianne Bulls that phenytoin Lvl serum<2.5, pt gets dilantin. MD verbalized to notify the rounding MD tomorrow.

## 2014-11-27 LAB — CBC
HEMATOCRIT: 41.8 % (ref 40.0–52.0)
Hemoglobin: 14 g/dL (ref 13.0–18.0)
MCH: 29.9 pg (ref 26.0–34.0)
MCHC: 33.4 g/dL (ref 32.0–36.0)
MCV: 89.3 fL (ref 80.0–100.0)
Platelets: 366 10*3/uL (ref 150–440)
RBC: 4.68 MIL/uL (ref 4.40–5.90)
RDW: 14.9 % — ABNORMAL HIGH (ref 11.5–14.5)
WBC: 16 10*3/uL — AB (ref 3.8–10.6)

## 2014-11-27 LAB — URINE CULTURE: Culture: NO GROWTH

## 2014-11-27 MED ORDER — AZITHROMYCIN 250 MG PO TABS: ORAL_TABLET | ORAL | Status: DC

## 2014-11-27 MED ORDER — PNEUMOCOCCAL VAC POLYVALENT 25 MCG/0.5ML IJ INJ: 0.5000 mL | INJECTION | INTRAMUSCULAR | Status: DC

## 2014-11-27 MED ORDER — PREDNISONE 5 MG PO TABS: 5.0000 mg | ORAL_TABLET | Freq: Every day | ORAL | Status: DC

## 2014-11-27 MED ORDER — CEFUROXIME AXETIL 500 MG PO TABS: 500.0000 mg | ORAL_TABLET | Freq: Two times a day (BID) | ORAL | Status: DC

## 2014-11-27 MED ORDER — SODIUM CHLORIDE 0.9 % IV SOLN
1000.0000 mg | Freq: Once | INTRAVENOUS | Status: AC
  Administered 2014-11-27: 11:00:00 1000 mg via INTRAVENOUS
  Filled 2014-11-27: qty 20

## 2014-11-27 NOTE — Discharge Instructions (Signed)

## 2014-11-27 NOTE — Discharge Summary (Signed)
[facility] [place] NAME: [name]    MR#:  [id number]  DATE OF BIRTH:  December 22, 1955  DATE OF ADMISSION:  11/25/2014 ADMITTING PHYSICIAN: Aldean Jewett, MD  DATE OF DISCHARGE: 11/27/2014  PRIMARY CARE PHYSICIAN: Marden Noble, MD    ADMISSION DIAGNOSIS:  Bilateral pneumonia [J18.9] Sepsis, due to unspecified organism [A41.9]  DISCHARGE DIAGNOSIS:  Active Problems:   Sepsis   Community acquired pneumonia   Tobacco abuse   Acute respiratory failure with hypoxia   SECONDARY DIAGNOSIS:   Past Medical History  Diagnosis Date  . GERD (gastroesophageal reflux disease)   . Liver disease   . Seizures     HOSPITAL COURSE:   1. Acute respiratory failure with hypoxia this has resolved. Patient is breathing comfortably on room air. 2. Clinical sepsis, bilateral pneumonia. Patient was treated with Rocephin and Zithromax. Patient has improved will be switched over to Ceftin and Zithromax upon discharge. 3. Leukocytosis- likely secondary to steroids and infection. Recommend checking a CBC and follow-up appointment. 4. Seizure disorder- Dilantin level is low we'll load with IV Dilantin 1000 mg 1 dose and continue phenobarbital and Dilantin also. Consider stopping Wellbutrin because this can lower the patient's risk for seizure. Stop tramadol for the same reason. 5. Tobacco abuse-  patient needs to stop smoking.  DISCHARGE CONDITIONS:   Satisfactory  CONSULTS OBTAINED:  None  DRUG ALLERGIES:  No Known Allergies  DISCHARGE MEDICATIONS:   Current Discharge Medication List    START taking these medications   Details  azithromycin (ZITHROMAX) 250 MG tablet Take one tab daily until finished at 6pm Qty: 3 each, Refills: 0    cefUROXime (CEFTIN) 500 MG tablet Take 1 tablet (500 mg total) by mouth 2 (two) times daily with a meal. Qty: 16 tablet, Refills: 0    predniSONE (DELTASONE) 5 MG tablet Take 1 tablet (5 mg  total) by mouth daily with breakfast. 4 tabs day 1; 3 tabs day 2;  2 tabs day 3; 1 tab day4,5 Qty: 11 tablet, Refills: 0      CONTINUE these medications which have NOT CHANGED   Details  acetaminophen (TYLENOL) 325 MG tablet Take 325 mg by mouth every 4 (four) hours as needed.    baclofen (LIORESAL) 10 MG tablet Take 10 mg by mouth 4 (four) times daily.     buPROPion (WELLBUTRIN XL) 150 MG 24 hr tablet Take 150 mg by mouth daily.    chlorproMAZINE (THORAZINE) 50 MG tablet Take 50 mg by mouth 4 (four) times daily as needed.    omeprazole (PRILOSEC) 20 MG capsule Take 20 mg by mouth 2 (two) times daily before a meal.    PHENobarbital (LUMINAL) 64.8 MG tablet Take 64.8 mg by mouth at bedtime.    phenytoin (DILANTIN) 100 MG ER capsule Take 200 mg by mouth 2 (two) times daily.    sennosides-docusate sodium (SENOKOT-S) 8.6-50 MG tablet Take 1 tablet by mouth 2 (two) times daily.      STOP taking these medications     traMADol (ULTRAM) 50 MG tablet          DISCHARGE INSTRUCTIONS:   Follow-up with Dr. Brynda Greathouse in 1 week.  If you experience worsening of your admission symptoms, develop shortness of breath, life threatening emergency, suicidal or homicidal thoughts you must seek medical attention immediately by calling 911 or calling your MD immediately  if symptoms less severe.  You Must read complete instructions/literature along with  all the possible adverse reactions/side effects for all the Medicines you take and that have been prescribed to you. Take any new Medicines after you have completely understood and accept all the possible adverse reactions/side effects.   Please note  You were cared for by a hospitalist during your hospital stay. If you have any questions about your discharge medications or the care you received while you were in the hospital after you are discharged, you can call the unit and asked to speak with the hospitalist on call if the hospitalist that took care  of you is not available. Once you are discharged, your primary care physician will handle any further medical issues. Please note that NO REFILLS for any discharge medications will be authorized once you are discharged, as it is imperative that you return to your primary care physician (or establish a relationship with a primary care physician if you do not have one) for your aftercare needs so that they can reassess your need for medications and monitor your lab values.    Today   CHIEF COMPLAINT:   Chief Complaint  Patient presents with  . Shortness of Breath    HISTORY OF PRESENT ILLNESS:  Jeremiah Nguyen  is a 59 y.o. male with a known history of seizure disorder presented with acute respiratory failure and clinical sepsis secondary to pneumonia.   VITAL SIGNS:  Blood pressure 136/64, pulse 87, temperature 98.6 F (37 C), temperature source Oral, resp. rate 18, height 5\' 10"  (1.778 m), weight 61.417 kg (135 lb 6.4 oz), SpO2 92 %.  I/O:   Intake/Output Summary (Last 24 hours) at 11/27/14 0853 Last data filed at 11/26/14 1800  Gross per 24 hour  Intake    240 ml  Output    150 ml  Net     90 ml    PHYSICAL EXAMINATION:  GENERAL:  59 y.o.-year-old patient lying in the bed with no acute distress.  EYES: Pupils equal, round, reactive to light and accommodation. No scleral icterus. Extraocular muscles intact.  HEENT: Head atraumatic, normocephalic. Oropharynx and nasopharynx clear.  NECK:  Supple, no jugular venous distention. No thyroid enlargement, no tenderness.  LUNGS: Normal breath sounds bilaterally, no wheezing, rales,rhonchi or crepitation. No use of accessory muscles of respiration.  CARDIOVASCULAR: S1, S2 normal. 3 and a 6 systolic murmur, no rubs, or gallops.  ABDOMEN: Soft, non-tender, non-distended. Bowel sounds present. No organomegaly or mass.  EXTREMITIES: No pedal edema, cyanosis, or clubbing.  NEUROLOGIC: Cranial nerves II through XII are intact. Muscle strength  5/5 in all extremities. Sensation intact. Gait not checked.  PSYCHIATRIC: The patient is alert and oriented x 3.  SKIN: No obvious rash, lesion, or ulcer.   DATA REVIEW:   CBC  Recent Labs Lab 11/27/14 0439  WBC 16.0*  HGB 14.0  HCT 41.8  PLT 366    Chemistries   Recent Labs Lab 11/26/14 0505  NA 140  K 3.6  CL 102  CO2 30  GLUCOSE 93  BUN 10  CREATININE 0.70  CALCIUM 8.5*  AST 95*  ALT 101*  ALKPHOS 104  BILITOT 0.7      RADIOLOGY:  Dg Chest Port 1 View  11/25/2014   CLINICAL DATA:  Smoker with current history of COPD, presenting with recent onset of shortness of breath and fever.  EXAM: PORTABLE CHEST - 1 VIEW  COMPARISON:  None.  FINDINGS: Cardiac silhouette mildly enlarged. Thoracic aorta tortuous and mildly atherosclerotic. Mildly prominent central pulmonary arteries. Patchy airspace opacities  in both lung bases, left greater than right, and in the right upper lobe. No visible pleural effusions. No pneumothorax. Emphysematous changes in the upper lobes.  IMPRESSION: 1. Patchy pneumonia involving both lung bases left greater than right, and involving the right upper lobe. 2. Cardiomegaly without evidence of pulmonary edema.   Electronically Signed   By: Evangeline Dakin M.D.   On: 11/25/2014 16:43     Management plans discussed with the patient, and Education officer, museum.  CODE STATUS:     Code Status Orders        Start     Ordered   11/25/14 1956  Full code   Continuous     11/25/14 1955      TOTAL TIME TAKING CARE OF THIS PATIENT: 40 minutes.    Loletha Grayer M.D on 11/27/2014 at 8:53 AM  Between 7am to 6pm - Pager - [phone]  After 6pm go to [email or web address] - password [email or web address] [place]  Office  [phone]  CC: Primary care physician; Marden Noble, MD

## 2014-11-27 NOTE — Clinical Social Work Note (Signed)
Pt is ready for discharge today and will return to [facility]. Pt is very excited to being discharged today and appreciative of CSW support. Administrator of [facility] is ready to accept pt. CSW faxed new FL2 (signed) and Rx to facility. Administrator stated that she will be contacting pt's sister to pick up pt after lunch. Pt is aware and agreeable. RN is aware. CSW is signing off as no further needs identified.   Darden Dates, MSW, LCSW Clinical Social Worker [phone]

## 2014-11-27 NOTE — Plan of Care (Addendum)
Problem: Discharge Progression Outcomes Goal: Barriers To Progression Addressed/Resolved Individualization: Pt is a high fall risk. Standby assist. Offer toileting qx1hr with safety checks. Bed alarm activated. H/o GERD, seizure- controlled with meds. From [facility]. Goal: Other Discharge Outcomes/Goals Plan of care progress to goals: O2 sats in the mid 90's on room air.  SOB with exertion. VSS. Denies pain.

## 2014-11-30 LAB — CULTURE, BLOOD (ROUTINE X 2)
CULTURE: NO GROWTH
Culture: NO GROWTH

## 2017-05-24 ENCOUNTER — Encounter: Payer: Self-pay | Admitting: Nurse Practitioner

## 2017-05-24 ENCOUNTER — Other Ambulatory Visit: Payer: Self-pay

## 2017-05-24 ENCOUNTER — Ambulatory Visit (INDEPENDENT_AMBULATORY_CARE_PROVIDER_SITE_OTHER): Payer: Medicaid Other | Admitting: Nurse Practitioner

## 2017-05-24 DIAGNOSIS — Z7689 Persons encountering health services in other specified circumstances: Secondary | ICD-10-CM | POA: Diagnosis not present

## 2017-05-24 DIAGNOSIS — G40909 Epilepsy, unspecified, not intractable, without status epilepticus: Secondary | ICD-10-CM

## 2017-05-24 DIAGNOSIS — Z79899 Other long term (current) drug therapy: Secondary | ICD-10-CM

## 2017-05-24 DIAGNOSIS — K219 Gastro-esophageal reflux disease without esophagitis: Secondary | ICD-10-CM

## 2017-05-24 MED ORDER — PHENOBARBITAL 60 MG PO TABS: 60.0000 mg | ORAL_TABLET | Freq: Every day | ORAL | 2 refills | Status: DC

## 2017-05-24 NOTE — Progress Notes (Signed)
Subjective:    Patient ID: Jeremiah Nguyen, male    DOB: 06-20-1955, 62 y.o.   MRN: [id number]  Jeremiah Nguyen is a 62 y.o. male presenting on 05/24/2017 for Establish Care (transition care from Dr. Brynda Nguyen office, [place])   HPI Establish Care New Provider Pt last seen by PCP Dr Jeremiah Nguyen 01-29-17.  Obtain records from Dr. Brynda Nguyen. He is accompanied today by his [facility] provider, Jeremiah Nguyen.  Seizure disorder Absence seizures lasts 1-2 minutes and only occur at most every 3-4 months.  Sometimes, pt has symptoms less frequently.  Staff know when Jeremiah Nguyen is having a seizure because he starts wiping/cleaning.  Can communicate "yes" that he is ok.  Then, he returns to previous activity after the seizure is completed.  GERD and Bulimia Pt has bulimia with inducement of vomiting when he feels he has eaten too much.  He only has the desire to vomit his food about once every other month, which is significantly less than several years ago.  However, both pt and Jeremiah Nguyen admit it is more frequent during the holidays.  After having a vomiting episode, he requests tylenol for some chest pain/heartburn symptoms.    Chronic, Intractable Hiccups Pt has had chronic hiccups without relief for several years. He is currently taking chlorpromazine for his symptoms.    Past Medical History:  Diagnosis Date  . COPD (chronic obstructive pulmonary disease) ([place])   . Eating disorder   . GERD (gastroesophageal reflux disease)   . Liver disease   . Seizures ([place])   . Sleep apnea    Past Surgical History:  Procedure Laterality Date  . LESION EXCISION Left 4-48-18   follicular cyst  . SKIN BIOPSY     Social History   Socioeconomic History  . Marital status: Single    Spouse name: Not on file  . Number of children: Not on file  . Years of education: Not on file  . Highest education level: Not on file  Social Needs  . Financial resource strain: Not on file  . Food  insecurity - worry: Not on file  . Food insecurity - inability: Not on file  . Transportation needs - medical: Not on file  . Transportation needs - non-medical: Not on file  Occupational History  . Not on file  Tobacco Use  . Smoking status: Current Every Day Smoker    Packs/day: 1.00    Years: 30.00    Pack years: 30.00  . Smokeless tobacco: Never Used  Substance and Sexual Activity  . Alcohol use: No  . Drug use: No  . Sexual activity: Not on file  Other Topics Concern  . Not on file  Social History Narrative  . Not on file   Family History  Problem Relation Age of Onset  . Cancer Mother        breast   Current Outpatient Medications on File Prior to Visit  Medication Sig  . acetaminophen (TYLENOL) 325 MG tablet Take 325 mg by mouth every 4 (four) hours as needed.  . baclofen (LIORESAL) 10 MG tablet Take 10 mg by mouth 4 (four) times daily.   Marland Kitchen buPROPion (WELLBUTRIN XL) 150 MG 24 hr tablet Take 150 mg by mouth daily.  . chlorproMAZINE (THORAZINE) 50 MG tablet Take 50 mg by mouth 4 (four) times daily as needed.  Marland Kitchen ipratropium (ATROVENT) 0.03 % nasal spray Place 2 sprays into both nostrils every 12 (twelve) hours.  Marland Kitchen  mirtazapine (REMERON) 15 MG tablet Take 15 mg by mouth at bedtime.  Marland Kitchen omeprazole (PRILOSEC) 20 MG capsule Take 20 mg by mouth 2 (two) times daily before a meal.  . phenytoin (DILANTIN) 100 MG ER capsule Take 200 mg by mouth 2 (two) times daily.  . sennosides-docusate sodium (SENOKOT-S) 8.6-50 MG tablet Take 1 tablet by mouth 2 (two) times daily.  . traZODone (DESYREL) 100 MG tablet Take 100 mg by mouth at bedtime.   No current facility-administered medications on file prior to visit.     Review of Systems  Constitutional: Negative.   HENT: Negative.   Eyes: Negative.   Respiratory: Negative.   Cardiovascular: Negative.   Gastrointestinal: Positive for abdominal pain (heartburn occasionally).  Musculoskeletal: Negative.   Skin: Negative.     Allergic/Immunologic: Negative.   Neurological: Negative.   Hematological: Negative.   Psychiatric/Behavioral: Positive for sleep disturbance.   Per HPI unless specifically indicated above     Objective:    There were no vitals taken for this visit.  Wt Readings from Last 3 Encounters:  11/25/14 135 lb 6.4 oz (61.4 kg)  01/06/14 144 lb (65.3 kg)  11/24/13 142 lb (64.4 kg)    Physical Exam  Constitutional: He is oriented to person, place, and time. He appears well-developed and well-nourished. No distress.  HENT:  Head: Normocephalic and atraumatic.  Mouth/Throat: Oropharynx is clear and moist.  Neck: Normal range of motion. Neck supple. No thyromegaly present.  Cardiovascular: Normal rate, regular rhythm, S1 normal, S2 normal, normal heart sounds and intact distal pulses.  Pulmonary/Chest: Effort normal and breath sounds normal. No respiratory distress.  Abdominal: Soft. Bowel sounds are normal. He exhibits no distension. There is no tenderness.  Neurological: He is alert and oriented to person, place, and time.  Skin: Skin is warm and dry.  Psychiatric: He has a normal mood and affect. His behavior is normal.  He is able to communicate, but is not forthcoming with information.  He relies heavily on his caretaker for communication of needs and symptoms.  Vitals reviewed.   Results for orders placed or performed in visit on 05/24/17  Dilantin (Phenytoin) level, total  Result Value Ref Range   Phenytoin, Total 12.1 10.0 - 20.0 mg/L  Comprehensive metabolic panel  Result Value Ref Range   Glucose, Bld 98 65 - 139 mg/dL   BUN 8 7 - 25 mg/dL   Creat 0.93 0.70 - 1.25 mg/dL   BUN/Creatinine Ratio NOT APPLICABLE 6 - 22 (calc)   Sodium 140 135 - 146 mmol/L   Potassium 3.7 3.5 - 5.3 mmol/L   Chloride 104 98 - 110 mmol/L   CO2 29 20 - 32 mmol/L   Calcium 9.4 8.6 - 10.3 mg/dL   Total Protein 6.8 6.1 - 8.1 g/dL   Albumin 4.1 3.6 - 5.1 g/dL   Globulin 2.7 1.9 - 3.7 g/dL (calc)    AG Ratio 1.5 1.0 - 2.5 (calc)   Total Bilirubin 0.5 0.2 - 1.2 mg/dL   Alkaline phosphatase (APISO) 127 (H) 40 - 115 U/L   AST 15 10 - 35 U/L   ALT 12 9 - 46 U/L  CBC with Differential/Platelet  Result Value Ref Range   WBC 4.8 3.8 - 10.8 Thousand/uL   RBC 5.13 4.20 - 5.80 Million/uL   Hemoglobin 15.5 13.2 - 17.1 g/dL   HCT 44.0 38.5 - 50.0 %   MCV 85.8 80.0 - 100.0 fL   MCH 30.2 27.0 - 33.0 pg  MCHC 35.2 32.0 - 36.0 g/dL   RDW 13.4 11.0 - 15.0 %   Platelets 273 140 - 400 Thousand/uL   MPV 10.0 7.5 - 12.5 fL   Neutro Abs 2,914 1,500 - 7,800 cells/uL   Lymphs Abs 1,262 850 - 3,900 cells/uL   WBC mixed population 442 200 - 950 cells/uL   Eosinophils Absolute 110 15 - 500 cells/uL   Basophils Absolute 72 0 - 200 cells/uL   Neutrophils Relative % 60.7 %   Total Lymphocyte 26.3 %   Monocytes Relative 9.2 %   Eosinophils Relative 2.3 %   Basophils Relative 1.5 %      Assessment & Plan:   Problem List Items Addressed This Visit      Digestive   Gastroesophageal reflux disease    Pt with heartburn worsened by bulimia.  Now reduced frequency of inducing vomiting and improving eating disorder.  Pt is currently on omeprazole for symptoms and takes PRN acetaminophen for pain.  Plan: 1. Continue omeprazole 20 mg once daily. 2. Continue prn acetaminophen for mild pain. 3. If symptoms worsen, may need GI referral or augmentation of pharmacotherapy. 4. Encouraged pt to avoid induction of vomiting as stomach contents worsen GERD symptoms and can cause erosion of esophagus exacerbating heartburn symptoms. Pt verbalizes understanding. 5. Followup as needed and at least once every year.      Relevant Orders   Comprehensive metabolic panel (Completed)   CBC with Differential/Platelet (Completed)     Nervous and Auditory   Seizure disorder ([place]) - Primary    Pt without recent seizure and only intermittent absence seizures.  Pt was previously managed for seizure disorder by PCP Dr. Brynda Nguyen.   Pt is stable on phenobarbital and dilantin.  Phenobarbital used also for chronic hiccoughs.  Plan: 1. Continue dilantin. - Recheck dilantin level today. 2. Continue to report seizures as they occur to medical office. 3. Followup 3 months.      Relevant Medications   PHENObarbital (LUMINAL) 60 MG tablet   Other Relevant Orders   Dilantin (Phenytoin) level, total (Completed)    Other Visit Diagnoses    Long-term use of high-risk medication     Pt on multiple high-risk medications.  Monitor drug levels, chemistry, and anemia.   Relevant Orders   Dilantin (Phenytoin) level, total (Completed)   Comprehensive metabolic panel (Completed)   CBC with Differential/Platelet (Completed)   Encounter to establish care     Previous PCP was at Dr. Brynda Nguyen.  Records will be requested.  Past medical, family, and surgical history reviewed w/ pt and caregiver in clinic today.       Meds ordered this encounter  Medications  . PHENObarbital (LUMINAL) 60 MG tablet    Sig: Take 1 tablet (60 mg total) by mouth at bedtime.    Dispense:  30 tablet    Refill:  2    Order Specific Question:   Supervising Provider    Answer:   Olin Hauser [[id number]]    Follow up plan: Return in about 3 months (around 08/22/2017) for Seizure disorder, liver disease, GERD.  Cassell Smiles, DNP, AGPCNP-BC Adult Gerontology Primary Care Nurse Practitioner [facility] 06/10/2017, 8:51 AM

## 2017-05-24 NOTE — Patient Instructions (Addendum)
[place], Thank you for coming in to clinic today.  1. Try drug holidays for trazodone to help improve sleep.  Try taking a 7 day break from trazodone - do not take for 7 days.  If not sleeping in 4 days, you can resume this med.  Once 7 days have passed, start taking trazodone 100mg  once daily at bedtime again to help with sleep.  Sleep Hygiene Tips  Take medicines only as directed by your health care provider.  Keep regular sleeping and waking hours. Avoid naps.  Keep a sleep diary to help you and your health care provider figure out what could be causing your insomnia. Include:  When you sleep.  When you wake up during the night.  How well you sleep.  How rested you feel the next day.  Any side effects of medicines you are taking.  What you eat and drink.  Make your bedroom a comfortable place where it is easy to fall asleep:  Put up shades or special blackout curtains to block light from outside.  Use a white noise machine to block noise.  Keep the temperature cool.  Exercise regularly as directed by your health care provider. Avoid exercising right before bedtime.  Use relaxation techniques to manage stress. Ask your health care provider to suggest some techniques that may work well for you. These may include:  Breathing exercises.  Routines to release muscle tension.  Visualizing peaceful scenes.  Cut back on alcohol, caffeinated beverages, and cigarettes, especially close to bedtime. These can disrupt your sleep.  Do not overeat or eat spicy foods right before bedtime. This can lead to digestive discomfort that can make it hard for you to sleep.  Limit screen use before bedtime. This includes:  Watching TV.  Using your smartphone, tablet, and computer.  Stick to a routine. This can help you fall asleep faster. Try to do a quiet activity, brush your teeth, and go to bed at the same time each night.  Get out of bed if you are still awake after 15 minutes of  trying to sleep. Keep the lights down, but try reading or doing a quiet activity. When you feel sleepy, go back to bed.  Make sure that you drive carefully. Avoid driving if you feel very sleepy.  Keep all follow-up appointments as directed by your health care provider. This is important.  2. Continue to update [place] for any additional seizures.  Please schedule a follow-up appointment with Cassell Smiles, AGNP. Return in about 3 months (around 08/22/2017) for Seizure disorder, liver disease, GERD.  If you have any other questions or concerns, please feel free to call the clinic or send a message through [place]. You may also schedule an earlier appointment if necessary.  You will receive a survey after today's visit either digitally by e-mail or paper by [employer]. Your experiences and feedback matter to [place].  Please respond so we know how we are doing as we provide care for you.   Cassell Smiles, DNP, AGNP-BC Adult Gerontology Nurse Practitioner [place]

## 2017-05-25 LAB — CBC WITH DIFFERENTIAL/PLATELET
Basophils Absolute: 72 cells/uL (ref 0–200)
Basophils Relative: 1.5 %
Eosinophils Absolute: 110 cells/uL (ref 15–500)
Eosinophils Relative: 2.3 %
HCT: 44 % (ref 38.5–50.0)
Hemoglobin: 15.5 g/dL (ref 13.2–17.1)
Lymphs Abs: 1262 cells/uL (ref 850–3900)
MCH: 30.2 pg (ref 27.0–33.0)
MCHC: 35.2 g/dL (ref 32.0–36.0)
MCV: 85.8 fL (ref 80.0–100.0)
MPV: 10 fL (ref 7.5–12.5)
Monocytes Relative: 9.2 %
Neutro Abs: 2914 cells/uL (ref 1500–7800)
Neutrophils Relative %: 60.7 %
Platelets: 273 10*3/uL (ref 140–400)
RBC: 5.13 10*6/uL (ref 4.20–5.80)
RDW: 13.4 % (ref 11.0–15.0)
Total Lymphocyte: 26.3 %
WBC mixed population: 442 cells/uL (ref 200–950)
WBC: 4.8 10*3/uL (ref 3.8–10.8)

## 2017-05-25 LAB — COMPREHENSIVE METABOLIC PANEL
AG Ratio: 1.5 (calc) (ref 1.0–2.5)
ALT: 12 U/L (ref 9–46)
AST: 15 U/L (ref 10–35)
Albumin: 4.1 g/dL (ref 3.6–5.1)
Alkaline phosphatase (APISO): 127 U/L — ABNORMAL HIGH (ref 40–115)
BUN: 8 mg/dL (ref 7–25)
CO2: 29 mmol/L (ref 20–32)
Calcium: 9.4 mg/dL (ref 8.6–10.3)
Chloride: 104 mmol/L (ref 98–110)
Creat: 0.93 mg/dL (ref 0.70–1.25)
Globulin: 2.7 g/dL (calc) (ref 1.9–3.7)
Glucose, Bld: 98 mg/dL (ref 65–139)
Potassium: 3.7 mmol/L (ref 3.5–5.3)
Sodium: 140 mmol/L (ref 135–146)
Total Bilirubin: 0.5 mg/dL (ref 0.2–1.2)
Total Protein: 6.8 g/dL (ref 6.1–8.1)

## 2017-05-25 LAB — PHENYTOIN LEVEL, TOTAL: Phenytoin, Total: 12.1 mg/L (ref 10.0–20.0)

## 2017-05-27 ENCOUNTER — Telehealth: Payer: Self-pay

## 2017-05-27 NOTE — Telephone Encounter (Signed)
Jeremiah Nguyen the patient caregiver was notified about the patient labs. No questions or concerns.

## 2017-05-27 NOTE — Telephone Encounter (Signed)
-----   Message from Mikey College, NP sent at 05/27/2017  9:36 AM EST ----- Dilantin level is normal.  No change in your medication dose.  CBC is normal.  You do not have anemia.  CMP: Your CMP is normal except for alkaline phosphatase.  This could be a single elevation that will go away as there are many causes for this to be mildly elevated.  We will check it again at your next labs in about 6 months.  Normal fasting glucose and normal electrolytes, kidney function, and liver function.

## 2017-06-10 ENCOUNTER — Encounter: Payer: Self-pay | Admitting: Nurse Practitioner

## 2017-06-10 DIAGNOSIS — K219 Gastro-esophageal reflux disease without esophagitis: Secondary | ICD-10-CM | POA: Insufficient documentation

## 2017-06-10 DIAGNOSIS — G40909 Epilepsy, unspecified, not intractable, without status epilepticus: Secondary | ICD-10-CM | POA: Insufficient documentation

## 2017-06-10 DIAGNOSIS — R569 Unspecified convulsions: Secondary | ICD-10-CM | POA: Insufficient documentation

## 2017-06-10 NOTE — Assessment & Plan Note (Signed)
Pt with heartburn worsened by bulimia.  Now reduced frequency of inducing vomiting and improving eating disorder.  Pt is currently on omeprazole for symptoms and takes PRN acetaminophen for pain.  Plan: 1. Continue omeprazole 20 mg once daily. 2. Continue prn acetaminophen for mild pain. 3. If symptoms worsen, may need GI referral or augmentation of pharmacotherapy. 4. Encouraged pt to avoid induction of vomiting as stomach contents worsen GERD symptoms and can cause erosion of esophagus exacerbating heartburn symptoms. Pt verbalizes understanding. 5. Followup as needed and at least once every year.

## 2017-06-10 NOTE — Assessment & Plan Note (Signed)
Pt without recent seizure and only intermittent absence seizures.  Pt was previously managed for seizure disorder by PCP Dr. Brynda Greathouse.  Pt is stable on phenobarbital and dilantin.  Phenobarbital used also for chronic hiccoughs.  Plan: 1. Continue dilantin. - Recheck dilantin level today. 2. Continue to report seizures as they occur to medical office. 3. Followup 3 months.

## 2017-06-12 ENCOUNTER — Other Ambulatory Visit: Payer: Self-pay

## 2017-06-12 MED ORDER — ACETAMINOPHEN 325 MG PO TABS: 325.0000 mg | ORAL_TABLET | ORAL | 0 refills | Status: DC | PRN

## 2017-06-18 ENCOUNTER — Emergency Department
Admission: EM | Admit: 2017-06-18 | Discharge: 2017-06-18 | Disposition: A | Discharge status: Home / Self Care | Payer: Medicaid Other | Attending: Emergency Medicine | Admitting: Emergency Medicine

## 2017-06-18 ENCOUNTER — Other Ambulatory Visit: Payer: Self-pay

## 2017-06-18 ENCOUNTER — Encounter: Payer: Self-pay | Admitting: Emergency Medicine

## 2017-06-18 DIAGNOSIS — F17200 Nicotine dependence, unspecified, uncomplicated: Secondary | ICD-10-CM | POA: Insufficient documentation

## 2017-06-18 DIAGNOSIS — R040 Epistaxis: Secondary | ICD-10-CM | POA: Diagnosis present

## 2017-06-18 DIAGNOSIS — J449 Chronic obstructive pulmonary disease, unspecified: Secondary | ICD-10-CM | POA: Diagnosis not present

## 2017-06-18 LAB — CBC
HCT: 45.3 % (ref 40.0–52.0)
HEMOGLOBIN: 14.9 g/dL (ref 13.0–18.0)
MCH: 30 pg (ref 26.0–34.0)
MCHC: 32.9 g/dL (ref 32.0–36.0)
MCV: 91.3 fL (ref 80.0–100.0)
PLATELETS: 228 10*3/uL (ref 150–440)
RBC: 4.96 MIL/uL (ref 4.40–5.90)
RDW: 14.9 % — AB (ref 11.5–14.5)
WBC: 7 10*3/uL (ref 3.8–10.6)

## 2017-06-18 MED ORDER — OXYMETAZOLINE HCL 0.05 % NA SOLN
1.0000 | Freq: Once | NASAL | Status: AC
  Administered 2017-06-18: 1 via NASAL
  Filled 2017-06-18: qty 15

## 2017-06-18 NOTE — Discharge Instructions (Signed)
Please take all precautions to prevent nosebleeds, including using a humidifier in your bedroom, avoiding blowing your nose or picking her nose, and applying Vaseline gently to the inside of your nose after you cut your fingernails as we discussed.  If you do develop a nosebleed, do not forget the technique that we discussed and that I showed you for how to stop the nosebleed.  The most important thing is to hold pressure for at least 15 minutes without peeking.  Return to the emergency department if you develop severe pain, lightheadedness or shortness of breath, fainting, bleeding that cannot be stopped with pressure, or any other symptoms concerning to you.

## 2017-06-18 NOTE — ED Notes (Signed)
Pt c/o epistaxis x3 days. Pt denies any hx of nose bleeds. Pt denies and lightheadedness, dizziness or n/v. Pt is NAD at this time.

## 2017-06-18 NOTE — ED Notes (Signed)
Topez not working 

## 2017-06-18 NOTE — ED Triage Notes (Signed)
Patient to ER for epistaxis that started on Monday. Per patient, bleeding was very light at first, but has become heavier in last 3-4 hours. Patient denies being on any blood thinning medications. Denies any trauma to nose or face.

## 2017-06-18 NOTE — ED Notes (Signed)
Patient's gauze removed from nares, nasal clamp removed. Patient's bleeding has slowed significantly. Patient spit out moderate sized clot. Gauze and clamp reapplied, to be checked again while in subwait area.

## 2017-06-18 NOTE — ED Provider Notes (Signed)
[facility] Emergency Department Provider Note  ____________________________________________  Time seen: Approximately 10:57 PM  I have reviewed the triage vital signs and the nursing notes.   HISTORY  Chief Complaint Epistaxis    HPI Jeremiah Nguyen is a 62 y.o. male, not anticoagulated, presenting for bilateral epistaxis.  The patient states that at 5 PM today, he was forcefully blowing his nose when he began to have bleeding.  It did not stop at home.  Upon arrival to the emergency department he did have some mild bleeding, so a clip was placed onto his nose.  He denies any nausea or vomiting, lightheadedness, shortness of breath.  Past Medical History:  Diagnosis Date  . COPD (chronic obstructive pulmonary disease) ([place])   . Eating disorder   . GERD (gastroesophageal reflux disease)   . Liver disease   . Seizures ([place])   . Sleep apnea     Patient Active Problem List   Diagnosis Date Noted  . Seizure disorder ([place]) 06/10/2017  . Gastroesophageal reflux disease 06/10/2017  . Sepsis ([place]) 11/25/2014  . Community acquired pneumonia 11/25/2014  . Tobacco abuse 11/25/2014  . Acute respiratory failure with hypoxia ([place]) 11/25/2014    Past Surgical History:  Procedure Laterality Date  . LESION EXCISION Left 6-56-81   follicular cyst  . SKIN BIOPSY      Current Outpatient Rx  . Order #: [id number] Class: Normal  . Order #: [id number] Class: Historical Med  . Order #: [id number] Class: Historical Med  . Order #: [id number] Class: Historical Med  . Order #: [id number] Class: Historical Med  . Order #: [id number] Class: Historical Med  . Order #: [id number] Class: Historical Med  . Order #: [id number] Class: Normal  . Order #: [id number] Class: Historical Med  . Order #: [id number] Class: Historical Med  . Order #: [id number] Class: Historical Med    Allergies Patient has no known allergies.  Family History  Problem Relation Age of Onset  . Cancer Mother        breast     Social History Social History   Tobacco Use  . Smoking status: Current Every Day Smoker    Packs/day: 1.00    Years: 30.00    Pack years: 30.00  . Smokeless tobacco: Never Used  Substance Use Topics  . Alcohol use: No  . Drug use: No    Review of Systems Constitutional: No fever/chills.  No lightheadedness or syncope. Eyes: No visual changes. ENT: No sore throat. No congestion or rhinorrhea.  Positive epistaxis. Cardiovascular: Denies chest pain. Denies palpitations. Respiratory: Denies shortness of breath.  No cough. Gastrointestinal: No abdominal pain.  No nausea, no vomiting.  Musculoskeletal: No trauma to the nose. Skin: Negative for rash. Neurological: Negative for headaches. No focal numbness, tingling or weakness.     ____________________________________________   PHYSICAL EXAM:  VITAL SIGNS: ED Triage Vitals  Enc Vitals Group     BP 06/18/17 2047 124/62     Pulse Rate 06/18/17 2047 85     Resp 06/18/17 2047 20     Temp 06/18/17 2047 97.6 F (36.4 C)     Temp Source 06/18/17 2047 Oral     SpO2 06/18/17 2047 97 %     Weight 06/18/17 2048 170 lb (77.1 kg)     Height 06/18/17 2048 5\' 6"  (1.676 m)     Head Circumference --      Peak Flow --      Pain Score --      Pain Loc --  Pain Edu? --      Excl. in [place]? --     Constitutional: Alert and oriented. Well appearing and in no acute distress. Answers questions appropriately. Eyes: Conjunctivae are normal.  EOMI. No scleral icterus. Head: Atraumatic. Nose: No congestion/rhinnorhea.  The patient has some mild crusted blood at the nare, erythematous mucous membranes on the septal aspect of the right nare, no acute bleeding.  No septal hematoma. Mouth/Throat: Mucous membranes are moist.  The posterior pharynx is clear.  There is no blood in the back of the mouth. Neck: No stridor.  Supple.   Cardiovascular: Normal rate, regular rhythm. No murmurs, rubs or gallops.  Respiratory: Normal respiratory  effort.  No accessory muscle use or retractions. Lungs CTAB.  No wheezes, rales or ronchi. Musculoskeletal: Moves all extremities well.  Normal gait without ataxia. Neurologic:  A&Ox3.  Speech is clear.  Face and smile are symmetric.  EOMI.  Moves all extremities well. Skin:  Skin is warm, dry and intact. No rash noted. Psychiatric: Mood and affect are normal. Speech and behavior are normal.  Normal judgement. ____________________________________________   LABS (all labs ordered are listed, but only abnormal results are displayed)  Labs Reviewed  CBC - Abnormal; Notable for the following components:      Result Value   RDW 14.9 (*)    All other components within normal limits   ____________________________________________  EKG  Not indicated ____________________________________________  RADIOLOGY  No results found.  ____________________________________________   PROCEDURES  Procedure(s) performed: None  Procedures  Critical Care performed: No ____________________________________________   INITIAL IMPRESSION / ASSESSMENT AND PLAN / ED COURSE  Pertinent labs & imaging results that were available during my care of the patient were reviewed by me and considered in my medical decision making (see chart for details).  62 y.o. male, not anticoagulated, with epistaxis from both nares, now resolved.  Overall, I do not see any evidence of hemodynamic instability or acute bleeding.  There is no trauma.  The patient does have erythematous mucous membranes in the right nare, and likely had a bleed due to his forceful blowing of his nose.  At this time, the patient is safe for discharge.  I have spoken with him extensively about epistaxis prevention as well as treatment, including red flags and when to come back to the emergency department.  He will follow-up with ENT as needed.  ____________________________________________  FINAL CLINICAL IMPRESSION(S) / ED DIAGNOSES  Final  diagnoses:  Epistaxis         NEW MEDICATIONS STARTED DURING THIS VISIT:  New Prescriptions   No medications on file      Eula Listen, MD 06/18/17 2300

## 2017-06-21 ENCOUNTER — Telehealth: Payer: Self-pay

## 2017-06-21 DIAGNOSIS — R040 Epistaxis: Secondary | ICD-10-CM

## 2017-06-21 NOTE — Telephone Encounter (Signed)
Pt can proceed w/ ENT eval.  Referral placed.

## 2017-06-21 NOTE — Telephone Encounter (Addendum)
Junious Dresser the pt caregiver called stating the pt was seen in the ER on Tuesday for epistaxis and was referred to [facility] ENT to see Dr. Tami Ribas. Junious Dresser contacted ENT office to schedule an appt and was informed that since the patient have medicaid that the referral have to come from his PCP. Please advise

## 2017-06-21 NOTE — Telephone Encounter (Signed)
The pt caregiver Junious Dresser was notified that the referral was faxed over to [facility] ENT.

## 2017-07-17 ENCOUNTER — Other Ambulatory Visit: Payer: Self-pay | Admitting: Nurse Practitioner

## 2017-07-17 MED ORDER — SENNA-DOCUSATE SODIUM 8.6-50 MG PO TABS: 1.0000 | ORAL_TABLET | Freq: Two times a day (BID) | ORAL | 5 refills | Status: DC

## 2017-07-17 NOTE — Telephone Encounter (Signed)
Pt care taker called requesting refill on Senokot S

## 2017-08-22 ENCOUNTER — Ambulatory Visit (INDEPENDENT_AMBULATORY_CARE_PROVIDER_SITE_OTHER): Payer: Medicaid Other | Admitting: Nurse Practitioner

## 2017-08-22 ENCOUNTER — Other Ambulatory Visit: Payer: Self-pay

## 2017-08-22 VITALS — BP 115/66 | HR 92 | Temp 98.6°F | Resp 16 | Ht 69.0 in | Wt 151.0 lb

## 2017-08-22 DIAGNOSIS — R1311 Dysphagia, oral phase: Secondary | ICD-10-CM

## 2017-08-22 DIAGNOSIS — G40909 Epilepsy, unspecified, not intractable, without status epilepticus: Secondary | ICD-10-CM

## 2017-08-22 DIAGNOSIS — E441 Mild protein-calorie malnutrition: Secondary | ICD-10-CM

## 2017-08-22 MED ORDER — UNJURY CHOCOLATE CLASSIC POWDER: 2.0000 [oz_av] | Freq: Two times a day (BID) | ORAL | 2 refills | Status: DC

## 2017-08-22 MED ORDER — PHENOBARBITAL 60 MG PO TABS: 60.0000 mg | ORAL_TABLET | Freq: Every day | ORAL | 2 refills | Status: DC

## 2017-08-22 NOTE — Progress Notes (Signed)
Subjective:    Patient ID: Jeremiah Nguyen, male    DOB: [birth year], 62 y.o.   MRN: [id number]  Jeremiah Nguyen is a 62 y.o. male presenting on 08/22/2017 for Follow-up (Care plan) and Vomiting (vomits while eating) Pt is accompanied today by a [facility] caregiver, Junious Dresser.  HPI Emesis Since last visit Junious Dresser notes that pt is cutting back on food a little, but continues to vomit some after eating.  He eats with puffed cheeks.  She prepares soft foods for him, but this is still proving difficult to swallow.  She describes that Kamoni holds food so long in his mouth that he starts choking, foods comes out of mouth and eyes start watering.  Has been happening every dinner for last several weeks. Pt admits he is a slow eater.but is because he has only one tooth.  Junious Dresser and patient note sometimes foods are aspirated and pt  is coughing at meals.  - Pt has history of binge eating.  Junious Dresser reports he is only inducing vomiting by regurgitation about 2-5 x per month.  - Eats breakfast and Dinner without lunch or snack.  He regularly eats tootsie rolls and is reported to eat these throughout the day.  Pt chooses to skip lunch and snack even though they are offered daily.  Seizure Disorder No seizures are reported since last visit.  He continues to take medications regularly without missed doses.  Junious Dresser is well-prepared with [facility] showing med administration.  Social History   Tobacco Use  . Smoking status: Current Every Day Smoker    Packs/day: 1.00    Years: 30.00    Pack years: 30.00  . Smokeless tobacco: Never Used  Substance Use Topics  . Alcohol use: No  . Drug use: No    Review of Systems Per HPI unless specifically indicated above     Objective:    BP 115/66 (BP Location: Right Arm, Patient Position: Sitting, Cuff Size: Normal)   Pulse 92   Temp 98.6 F (37 C)   Resp 16   Ht 5\' 9"  (1.753 m)   Wt 151 lb (68.5 kg)   BMI 22.30 kg/m    Wt Readings from Last 3 Encounters:    08/22/17 151 lb (68.5 kg)  06/18/17 170 lb (77.1 kg)  11/25/14 135 lb 6.4 oz (61.4 kg)    Physical Exam  Constitutional: He is oriented to person, place, and time. He appears well-developed and well-nourished. No distress.  HENT:  Head: Normocephalic and atraumatic.  Mouth/Throat: Oropharynx is clear and moist and mucous membranes are normal. Abnormal dentition.  Neck: Normal range of motion. Neck supple. Carotid bruit is not present.  Cardiovascular: Normal rate, regular rhythm, S1 normal, S2 normal, normal heart sounds and intact distal pulses.  Pulmonary/Chest: Effort normal and breath sounds normal. No respiratory distress.  Abdominal: Soft. Bowel sounds are normal. He exhibits no distension. There is no tenderness.  Musculoskeletal: He exhibits no edema (pedal).  Neurological: He is alert and oriented to person, place, and time.  Skin: Skin is warm and dry.  Psychiatric: He has a normal mood and affect. His behavior is normal. Thought content normal.  Vitals reviewed.    Results for orders placed or performed during the hospital encounter of 06/18/17  CBC  Result Value Ref Range   WBC 7.0 3.8 - 10.6 K/uL   RBC 4.96 4.40 - 5.90 MIL/uL   Hemoglobin 14.9 13.0 - 18.0 g/dL   HCT 45.3 40.0 - 52.0 %  MCV 91.3 80.0 - 100.0 fL   MCH 30.0 26.0 - 34.0 pg   MCHC 32.9 32.0 - 36.0 g/dL   RDW 14.9 (H) 11.5 - 14.5 %   Platelets 228 150 - 440 K/uL      Assessment & Plan:   Problem List Items Addressed This Visit      Nervous and Auditory   Seizure disorder ([place]) Stable since last visit without seizure activity to report.  Pt has been stable on phenobarbital for several years.  Continues to take without side effects.  Plan: 1. Continue phenobarbital without changes. 2. Continue to monitor for changes in seizure activity. 3. Consider referral to neurology if any changes occur 4. Followup 6 months.   Relevant Medications   PHENObarbital (LUMINAL) 60 MG tablet    Other Visit  Diagnoses    Oral phase dysphagia    -  Primary Pt with inability to complete oral phase of swallowing cycle without coughing, aspirating, and leaking food from mouth.  Complicated by lack of proper dentition, however, pt may also have cognitive disconnect for proper swallow mechanisms given his behavioral and cognitive abnormalities.  Plan: 1. Swallow evaluation recommended.  Consider treatment with ST as well.  Outpatient rehab referral placed. 2. Continue to encourage small bites, prompt pt to swallow after 5-10 chews.  Do not hold food in mouth for longer than 5-10 chews.   Relevant Orders   SLP clinical swallow evaluation   Mild protein-calorie malnutrition ([place])     Pt with inadequate protein intake with only 2 small meals per day.   Plan: 1. Start protein shake with protein powder.  Drink one per day.   2. Encouraged adequate meal intake with proteins at each meal. 3. Followup 3 months.   Relevant Medications   protein supplement (UNJURY CHOCOLATE CLASSIC) POWD      Meds ordered this encounter  Medications  . protein supplement (UNJURY CHOCOLATE CLASSIC) POWD    Sig: Take 7 g (2 oz total) by mouth 2 (two) times daily.    Dispense:  420 g    Refill:  2    Order Specific Question:   Supervising Provider    Answer:   Olin Hauser [[id number]]  . PHENObarbital (LUMINAL) 60 MG tablet    Sig: Take 1 tablet (60 mg total) by mouth at bedtime.    Dispense:  30 tablet    Refill:  2    Order Specific Question:   Supervising Provider    Answer:   Olin Hauser [[id number]]    Follow up plan: Return in about 3 months (around 11/21/2017) for seizures, eating disorder, swallowing.  Cassell Smiles, DNP, AGPCNP-BC Adult Gerontology Primary Care Nurse Practitioner [facility] 09/17/2017, 9:29 PM

## 2017-08-22 NOTE — Patient Instructions (Addendum)
Jeremiah Nguyen,   Thank you for coming in to clinic today.  1. Continue small meals.  Intentionally chew no more than 10 times per bite of food.  2. Start protein shake twice daily.  Please schedule a follow-up appointment with Cassell Smiles, AGNP. Return in about 3 months (around 11/21/2017) for seizures, eating disorder, swallowing.  If you have any other questions or concerns, please feel free to call the clinic or send a message through [place]. You may also schedule an earlier appointment if necessary.  You will receive a survey after today's visit either digitally by e-mail or paper by [employer]. Your experiences and feedback matter to [place].  Please respond so we know how we are doing as we provide care for you.   Cassell Smiles, DNP, AGNP-BC Adult Gerontology Nurse Practitioner [place]

## 2017-09-17 ENCOUNTER — Encounter: Payer: Self-pay | Admitting: Nurse Practitioner

## 2017-11-20 ENCOUNTER — Ambulatory Visit: Payer: Medicaid Other | Admitting: Nurse Practitioner

## 2017-12-20 ENCOUNTER — Other Ambulatory Visit: Payer: Self-pay | Admitting: Nurse Practitioner

## 2017-12-20 DIAGNOSIS — G40909 Epilepsy, unspecified, not intractable, without status epilepticus: Secondary | ICD-10-CM

## 2017-12-20 NOTE — Telephone Encounter (Signed)
Pt needs a refill on phenobarbital sent to [facility], same as [facility].  His next appt is scheduled for August 12th.

## 2017-12-30 ENCOUNTER — Ambulatory Visit (INDEPENDENT_AMBULATORY_CARE_PROVIDER_SITE_OTHER): Payer: Medicaid Other | Admitting: Nurse Practitioner

## 2017-12-30 ENCOUNTER — Other Ambulatory Visit: Payer: Self-pay

## 2017-12-30 ENCOUNTER — Encounter: Payer: Self-pay | Admitting: Nurse Practitioner

## 2017-12-30 VITALS — BP 124/58 | HR 82 | Temp 98.4°F | Ht 69.0 in | Wt 139.6 lb

## 2017-12-30 DIAGNOSIS — R066 Hiccough: Secondary | ICD-10-CM | POA: Diagnosis not present

## 2017-12-30 DIAGNOSIS — E876 Hypokalemia: Secondary | ICD-10-CM

## 2017-12-30 DIAGNOSIS — K219 Gastro-esophageal reflux disease without esophagitis: Secondary | ICD-10-CM

## 2017-12-30 DIAGNOSIS — R634 Abnormal weight loss: Secondary | ICD-10-CM | POA: Diagnosis not present

## 2017-12-30 DIAGNOSIS — R748 Abnormal levels of other serum enzymes: Secondary | ICD-10-CM

## 2017-12-30 DIAGNOSIS — E441 Mild protein-calorie malnutrition: Secondary | ICD-10-CM | POA: Diagnosis not present

## 2017-12-30 DIAGNOSIS — G40909 Epilepsy, unspecified, not intractable, without status epilepticus: Secondary | ICD-10-CM | POA: Diagnosis not present

## 2017-12-30 DIAGNOSIS — Z8719 Personal history of other diseases of the digestive system: Secondary | ICD-10-CM

## 2017-12-30 MED ORDER — UNJURY CHOCOLATE CLASSIC POWDER: 2.0000 [oz_av] | Freq: Every day | ORAL | 2 refills | Status: DC

## 2017-12-30 MED ORDER — OMEPRAZOLE 20 MG PO CPDR: 20.0000 mg | DELAYED_RELEASE_CAPSULE | Freq: Two times a day (BID) | ORAL | 5 refills | Status: DC

## 2017-12-30 NOTE — Patient Instructions (Addendum)
Jeremiah Nguyen,   Thank you for coming in to clinic today.  [facility] will call to schedule your appointment.  2. START drinking chocolate shake at lunch.  3. Sleep to avoid vomiting after meal OR stay in common area for 1 hour after your meal.  Please schedule a follow-up appointment with Cassell Smiles, AGNP. Return in about 3 months (around 04/01/2018) for hiccups and weight loss.  If you have any other questions or concerns, please feel free to call the clinic or send a message through [place]. You may also schedule an earlier appointment if necessary.  You will receive a survey after today's visit either digitally by e-mail or paper by [employer]. Your experiences and feedback matter to [place].  Please respond so we know how we are doing as we provide care for you.   Cassell Smiles, DNP, AGNP-BC Adult Gerontology Nurse Practitioner [place]

## 2017-12-30 NOTE — Progress Notes (Signed)
Subjective:    Patient ID: Jeremiah Nguyen, male    DOB: 04/08/1956, 62 y.o.   MRN: [id number]  Jeremiah Nguyen is a 62 y.o. male presenting on 12/30/2017 for Medication Refill and Emesis (pt making himself vomit to stop his hiccups The pt caregiver states that he makes himself vomit after almost ever meal, because he's concern about his weight.  weight loss x 11 lbs since last visit.)   HPI Chronic, Intractable Hiccups and Weight Loss Has hiccups and causes self-induced vomiting as management strategy.  He also naps to help these go away sometimes.  If Junious Dresser or staff see him coming from bathroom or from patio/walkway after meal, this tells Junious Dresser he has had self-induced vomiting.  He is doing this much more frequently recently.  Patient states he does this to relieve hiccups.  He is not doing this to lose weight as he was happier at 178 lbs.  After vomiting, patient has "chest pain" resolved with tylenol.  Patient has history of bulimia, but currently denies these symptoms.  - Patient has taken Thorazine long term as well as baclofen without any relief from his intractable hiccups. - Patient states hiccups are not daily, but occur multiple times per week.   Typical Meal intake: Breakfast- Eggs, sausage, pancakes for "heavy breakfast" today.  - Oatmeal sometimes.   No lunch - eats snack of 2 cookies.  Will not eat meal at lunch. Dinner - Regularly eats full meal. Evening snack - eats 2 cookies. Eats tootsie rolls throughout the day.  Social History   Tobacco Use  . Smoking status: Current Every Day Smoker    Packs/day: 1.00    Years: 30.00    Pack years: 30.00  . Smokeless tobacco: Never Used  Substance Use Topics  . Alcohol use: No  . Drug use: No    Review of Systems Per HPI unless specifically indicated above     Objective:    BP (!) 124/58 (BP Location: Right Arm, Patient Position: Sitting, Cuff Size: Normal)   Pulse 82   Temp 98.4 F (36.9 C) (Oral)   Ht 5\' 9"  (1.753  m)   Wt 139 lb 9.6 oz (63.3 kg)   BMI 20.62 kg/m   Wt Readings from Last 3 Encounters:  12/30/17 139 lb 9.6 oz (63.3 kg)  08/22/17 151 lb (68.5 kg)  06/18/17 170 lb (77.1 kg)    Physical Exam  Constitutional: He is oriented to person, place, and time. He appears well-developed. No distress.  Thin appearing  HENT:  Head: Normocephalic and atraumatic.  Cardiovascular: Normal rate, regular rhythm, S1 normal, S2 normal, normal heart sounds and intact distal pulses.  Pulmonary/Chest: Effort normal and breath sounds normal. No respiratory distress.  Abdominal: Soft. Bowel sounds are normal. He exhibits no distension. There is no hepatosplenomegaly. There is no tenderness. No hernia.  Neurological: He is alert and oriented to person, place, and time.  Skin: Skin is warm and dry. Capillary refill takes less than 2 seconds.  Psychiatric: He has a normal mood and affect. His speech is normal and behavior is normal. Thought content normal. Cognition and memory are normal. He expresses inappropriate judgment. He does not express impulsivity. He expresses no homicidal and no suicidal ideation. He expresses no suicidal plans and no homicidal plans. He is attentive.  Vitals reviewed.   Results for orders placed or performed during the hospital encounter of 06/18/17  CBC  Result Value Ref Range   WBC 7.0 3.8 -  10.6 K/uL   RBC 4.96 4.40 - 5.90 MIL/uL   Hemoglobin 14.9 13.0 - 18.0 g/dL   HCT 45.3 40.0 - 52.0 %   MCV 91.3 80.0 - 100.0 fL   MCH 30.0 26.0 - 34.0 pg   MCHC 32.9 32.0 - 36.0 g/dL   RDW 14.9 (H) 11.5 - 14.5 %   Platelets 228 150 - 440 K/uL      Assessment & Plan:   Problem List Items Addressed This Visit      Digestive   Gastroesophageal reflux disease   Relevant Medications   omeprazole (PRILOSEC) 20 MG capsule     Nervous and Auditory   Seizure disorder (H[facility])   Relevant Orders   Phenobarbital level    Other Visit Diagnoses    Unintended weight loss    -  Primary    Relevant Medications   protein supplement (UNJURY CHOCOLATE CLASSIC) POWD   Other Relevant Orders   COMPLETE METABOLIC PANEL WITH GFR   PSA   HIV antibody   Hepatitis C antibody   Hemoglobin A1c   CBC with Differential/Platelet   Ambulatory referral to Gastroenterology   TSH   Mild protein-calorie malnutrition ([place])       Relevant Medications   protein supplement (UNJURY CHOCOLATE CLASSIC) POWD   Intractable hiccoughs       Relevant Orders   Ambulatory referral to Gastroenterology    # Patient with uncontrolled, intractable chronic hiccups.  Not managed with Thorazine or baclofen.  Patient is causing self-induced vomiting to control symptoms.  Patient has hiccups today. - With GERD and other GI symptoms, will start with GI referral for additional workup related to known history of GERD.  Patient may need neurology workup in future.   - Encouraged patient to avoid all self-induced vomiting.  Must stay visible to staff for 1 hour after breakfast and dinner.  Patient and Junious Dresser verbalize understanding.  - Followup 3 months  # Weight loss and protein-calorie malnutrition Concern for bulimia vs malignancy as causes of weight loss.  Patient denies bulimia today, but endorses self-induced vomiting after meals which is definitely a contributor to weight loss.   - Screen labs for possible causes of malignancy, HIV, Hep C, Diabetes - START eating protein shake with lunch serving of cookies. - Increase protein intake if possible in other meals. - Follow up 3 months and in about 4 weeks for weight check.    Meds ordered this encounter  Medications  . protein supplement (UNJURY CHOCOLATE CLASSIC) POWD    Sig: Take 7 g (2 oz total) by mouth daily with lunch.    Dispense:  420 g    Refill:  2    Order Specific Question:   Supervising Provider    Answer:   Olin Hauser [[id number]]    Follow up plan: Return in about 3 months (around 04/01/2018) for hiccups and weight loss.  Cassell Smiles, DNP, AGPCNP-BC Adult Gerontology Primary Care Nurse Practitioner [facility] 12/30/2017, 11:14 AM

## 2017-12-31 ENCOUNTER — Telehealth: Payer: Self-pay | Admitting: Nurse Practitioner

## 2017-12-31 DIAGNOSIS — E441 Mild protein-calorie malnutrition: Secondary | ICD-10-CM

## 2017-12-31 DIAGNOSIS — R634 Abnormal weight loss: Secondary | ICD-10-CM

## 2017-12-31 MED ORDER — UNJURY CHOCOLATE CLASSIC POWDER: 2.0000 [oz_av] | Freq: Every day | ORAL | 2 refills | Status: DC

## 2017-12-31 NOTE — Telephone Encounter (Signed)
Substitutions are permitted.  Called pharmacy, provided verbal, and will fax new Rx.

## 2017-12-31 NOTE — Telephone Encounter (Signed)
Per Junious Dresser, Pharmacy is saying they are having a hard time getting the protein supplement ([place]) POWD [[id number]]  and want to see if it can be swapped out for something generic?

## 2018-01-01 ENCOUNTER — Telehealth: Payer: Self-pay

## 2018-01-01 NOTE — Telephone Encounter (Signed)
error 

## 2018-01-02 ENCOUNTER — Telehealth: Payer: Self-pay

## 2018-01-02 NOTE — Telephone Encounter (Signed)
Please notify Jeremiah Nguyen that we are looking at OTC protein shakes for our best covered option.  Powdered protein drink is fine in place of options like Ensure/Boost that are more expensive.  Look for around 15 g protein per serving.

## 2018-01-02 NOTE — Telephone Encounter (Signed)
I received a phone call from the patient pharmacy to inform us that the protein supplement shakes are not covered by his insurance and the cheapest alternative protein shake would be around $19. It's cheaper to get the prescription over the counter.

## 2018-01-03 NOTE — Telephone Encounter (Signed)
The pt caregiver was notified in the office of Lauren recommendations.

## 2018-01-07 ENCOUNTER — Encounter: Payer: Self-pay | Admitting: Nurse Practitioner

## 2018-01-07 DIAGNOSIS — K769 Liver disease, unspecified: Secondary | ICD-10-CM | POA: Insufficient documentation

## 2018-01-07 LAB — CBC WITH DIFFERENTIAL/PLATELET
Basophils Absolute: 70 cells/uL (ref 0–200)
Basophils Relative: 1.6 %
Eosinophils Absolute: 119 cells/uL (ref 15–500)
Eosinophils Relative: 2.7 %
HCT: 44.7 % (ref 38.5–50.0)
Hemoglobin: 15.3 g/dL (ref 13.2–17.1)
Lymphs Abs: 1408 cells/uL (ref 850–3900)
MCH: 29.8 pg (ref 27.0–33.0)
MCHC: 34.2 g/dL (ref 32.0–36.0)
MCV: 87 fL (ref 80.0–100.0)
MPV: 10.9 fL (ref 7.5–12.5)
Monocytes Relative: 9.4 %
Neutro Abs: 2389 cells/uL (ref 1500–7800)
Neutrophils Relative %: 54.3 %
Platelets: 163 10*3/uL (ref 140–400)
RBC: 5.14 10*6/uL (ref 4.20–5.80)
RDW: 13.8 % (ref 11.0–15.0)
Total Lymphocyte: 32 %
WBC mixed population: 414 cells/uL (ref 200–950)
WBC: 4.4 10*3/uL (ref 3.8–10.8)

## 2018-01-07 LAB — HEPATITIS C ANTIBODY
Hepatitis C Ab: NONREACTIVE
SIGNAL TO CUT-OFF: 0.04 (ref <1.00)

## 2018-01-07 LAB — HEMOGLOBIN A1C
Hgb A1c MFr Bld: 5.2 % of total Hgb (ref <5.7)
Mean Plasma Glucose: 103 (calc)
eAG (mmol/L): 5.7 (calc)

## 2018-01-07 LAB — COMPLETE METABOLIC PANEL WITH GFR
AG Ratio: 1.4 (calc) (ref 1.0–2.5)
ALT: 11 U/L (ref 9–46)
AST: 19 U/L (ref 10–35)
Albumin: 4 g/dL (ref 3.6–5.1)
Alkaline phosphatase (APISO): 152 U/L — ABNORMAL HIGH (ref 40–115)
BUN/Creatinine Ratio: 6 (calc) (ref 6–22)
BUN: 5 mg/dL — ABNORMAL LOW (ref 7–25)
CO2: 34 mmol/L — ABNORMAL HIGH (ref 20–32)
Calcium: 9.1 mg/dL (ref 8.6–10.3)
Chloride: 98 mmol/L (ref 98–110)
Creat: 0.86 mg/dL (ref 0.70–1.25)
GFR, Est African American: 107 mL/min/{1.73_m2} (ref >=60)
GFR, Est Non African American: 92 mL/min/{1.73_m2} (ref >=60)
Globulin: 2.8 g/dL (calc) (ref 1.9–3.7)
Glucose, Bld: 92 mg/dL (ref 65–99)
Potassium: 3.3 mmol/L — ABNORMAL LOW (ref 3.5–5.3)
Sodium: 139 mmol/L (ref 135–146)
Total Bilirubin: 0.6 mg/dL (ref 0.2–1.2)
Total Protein: 6.8 g/dL (ref 6.1–8.1)

## 2018-01-07 LAB — PSA: PSA: 1.1 ng/mL (ref <=4.0)

## 2018-01-07 LAB — HIV ANTIBODY (ROUTINE TESTING W REFLEX): HIV 1&2 Ab, 4th Generation: NONREACTIVE

## 2018-01-07 LAB — TSH: TSH: 0.4 mIU/L (ref 0.40–4.50)

## 2018-01-07 LAB — PHENOBARBITAL LEVEL: Phenobarbital, Serum: 20.1 mg/L (ref 15.0–40.0)

## 2018-01-07 MED ORDER — POTASSIUM CHLORIDE ER 20 MEQ PO TBCR: 20.0000 meq | EXTENDED_RELEASE_TABLET | Freq: Every day | ORAL | 2 refills | Status: DC

## 2018-01-07 NOTE — Addendum Note (Signed)
Addended by: Cleaster Corin on: 01/07/2018 01:26 PM   Modules accepted: Orders

## 2018-01-10 ENCOUNTER — Other Ambulatory Visit: Payer: Self-pay

## 2018-01-17 ENCOUNTER — Other Ambulatory Visit: Payer: Self-pay | Admitting: Nurse Practitioner

## 2018-01-17 DIAGNOSIS — G40909 Epilepsy, unspecified, not intractable, without status epilepticus: Secondary | ICD-10-CM

## 2018-01-17 MED ORDER — PHENOBARBITAL 60 MG PO TABS: 60.0000 mg | ORAL_TABLET | Freq: Every day | ORAL | 2 refills | Status: DC

## 2018-01-17 NOTE — Telephone Encounter (Signed)
Jeremiah Nguyen said pt needs a refill on phenobarbital sent to [place].  He only has 2 left.

## 2018-01-29 ENCOUNTER — Ambulatory Visit
Admission: RE | Admit: 2018-01-29 | Discharge: 2018-01-29 | Disposition: A | Discharge status: Home / Self Care | Payer: Medicaid Other | Source: Ambulatory Visit | Attending: Nurse Practitioner | Admitting: Nurse Practitioner

## 2018-01-29 DIAGNOSIS — R634 Abnormal weight loss: Secondary | ICD-10-CM | POA: Diagnosis not present

## 2018-01-29 DIAGNOSIS — Z8719 Personal history of other diseases of the digestive system: Secondary | ICD-10-CM | POA: Insufficient documentation

## 2018-01-29 DIAGNOSIS — R748 Abnormal levels of other serum enzymes: Secondary | ICD-10-CM | POA: Diagnosis not present

## 2018-02-11 ENCOUNTER — Ambulatory Visit (INDEPENDENT_AMBULATORY_CARE_PROVIDER_SITE_OTHER): Payer: Medicaid Other | Admitting: Gastroenterology

## 2018-02-11 ENCOUNTER — Other Ambulatory Visit: Payer: Self-pay

## 2018-02-11 VITALS — BP 112/61 | HR 88 | Ht 69.0 in | Wt 140.6 lb

## 2018-02-11 DIAGNOSIS — Z1211 Encounter for screening for malignant neoplasm of colon: Secondary | ICD-10-CM

## 2018-02-11 DIAGNOSIS — R066 Hiccough: Secondary | ICD-10-CM

## 2018-02-11 DIAGNOSIS — R748 Abnormal levels of other serum enzymes: Secondary | ICD-10-CM | POA: Diagnosis not present

## 2018-02-11 DIAGNOSIS — F5089 Other specified eating disorder: Secondary | ICD-10-CM | POA: Diagnosis not present

## 2018-02-11 DIAGNOSIS — R634 Abnormal weight loss: Secondary | ICD-10-CM

## 2018-02-11 MED ORDER — OMEPRAZOLE 40 MG PO CPDR: 40.0000 mg | DELAYED_RELEASE_CAPSULE | Freq: Every day | ORAL | 3 refills | Status: DC

## 2018-02-11 NOTE — Progress Notes (Signed)
Jeremiah Bellows MD, MRCP(U.K) [address]  [place]  [place], [place] [address]  Main: [phone]  Fax: [phone]   Gastroenterology Consultation  Referring Provider:     Mikey Nguyen, * Primary Care Physician:  Jeremiah College, NP Primary Gastroenterologist:  Dr. Jonathon Nguyen  Reason for Consultation:     Weight loss and Hiccups        HPI:   Jeremiah Nguyen is a 62 y.o. y/o male referred for consultation & management  by Dr. Merrilyn Nguyen, Jeremiah Ivory, NP.     He has been referred for hiccups and weight loss. RUQ USG 01/29/18 showed no gross abnormality .   Labs 01/03/18: Alk phos elevated at 152, HIV ,Hep C ab negative. CBC,TSH-normal .HBa1c normal  .   Says he has lost 30 lbs since jan 2019 . Denies any cough, except while smoking , normal bowel movements, no blood in stool. No abdominal pain but chest pains when he throws up. Does not smoke any weeds. Did use drugs in the pas 17 years. He has hiccups, on and off, occurs every day , each episode lasts till he throws up or goes to bed. . This has been ongoing for many years. No change recently . He has issues with balance. IF he is walking , can loose balance. Can button his shirt. No issues with vision no tinitus. Has had issues with a clogged ear in the past and says he was seen by ENT and his left ear was completely blocked and had it cleaned. Hiccups did not get any better.   Never had an EGD in the past , unsure of a colonoscopy. Says he cannot drink the prep for the colonoscopy. Presently finds it hard even taking a protein powder.   He is here with his sister who says that he induces vomiting when he thinks he gains weight., he was actually showing me the steps he performs to induce vomiting.     Past Medical History:  Diagnosis Date  . COPD (chronic obstructive pulmonary disease) ([place])   . Eating disorder   . GERD (gastroesophageal reflux disease)   . Liver disease   . Seizures ([place])   . Sleep apnea       Past Surgical History:  Procedure Laterality Date  . LESION EXCISION Left 06-19-84   follicular cyst  . SKIN BIOPSY      Prior to Admission medications   Medication Sig Start Date End Date Taking? Authorizing Provider  acetaminophen (TYLENOL) 325 MG tablet Take 1 tablet (325 mg total) by mouth every 4 (four) hours as needed. 06/12/17   Jeremiah College, NP  baclofen (LIORESAL) 10 MG tablet Take 10 mg by mouth 4 (four) times daily.     [provider]  buPROPion (WELLBUTRIN XL) 150 MG 24 hr tablet Take 150 mg by mouth daily.    [provider]  chlorproMAZINE (THORAZINE) 50 MG tablet Take 50 mg by mouth 4 (four) times daily as needed.    [provider]  mirtazapine (REMERON) 15 MG tablet Take 15 mg by mouth at bedtime.    [provider]  omeprazole (PRILOSEC) 20 MG capsule Take 1 capsule (20 mg total) by mouth 2 (two) times daily before a meal. 12/30/17   Jeremiah College, NP  PHENObarbital (LUMINAL) 60 MG tablet Take 1 tablet (60 mg total) by mouth at bedtime. 01/17/18   Jeremiah College, NP  phenytoin (DILANTIN) 100 MG ER capsule Take  200 mg by mouth 2 (two) times daily.    [provider]  Potassium Chloride ER 20 MEQ TBCR Take 20 mEq by mouth daily. 01/07/18   Jeremiah College, NP  protein supplement (UNJURY CHOCOLATE CLASSIC) POWD Take 7 g (2 oz total) by mouth daily with lunch. 12/31/17   Jeremiah College, NP  sennosides-docusate sodium (SENOKOT-S) 8.6-50 MG tablet Take 1 tablet by mouth 2 (two) times daily. 07/17/17   Jeremiah College, NP  traZODone (DESYREL) 100 MG tablet Take 100 mg by mouth at bedtime.    [provider]    Family History  Problem Relation Age of Onset  . Cancer Mother        breast     Social History   Tobacco Use  . Smoking status: Current Every Day Smoker    Packs/day: 1.00    Years: 30.00    Pack years: 30.00  . Smokeless tobacco: Never Used  Substance Use Topics  .  Alcohol use: No  . Drug use: No    Allergies as of 02/11/2018  . (No Known Allergies)    Review of Systems:    All systems reviewed and negative except where noted in HPI.   Physical Exam:  There were no vitals taken for this visit. No LMP for male patient. Psych:  Alert and cooperative. Normal mood and affect. General:   Alert,  Well-developed, well-nourished, pleasant and cooperative in NAD Head:  Normocephalic and atraumatic. Eyes:  Sclera clear, no icterus.   Conjunctiva pink. Ears:  Normal auditory acuity. Nose:  No deformity, discharge, or lesions. Mouth:  No deformity or lesions,oropharynx pink & moist. Neck:  Supple; no masses or thyromegaly. Lungs:  Respirations even and unlabored.  Clear throughout to auscultation.   No wheezes, crackles, or rhonchi. No acute distress. Heart:  Regular rate and rhythm; no murmurs, clicks, rubs, or gallops. Abdomen:  Normal bowel sounds.  No bruits.  Soft, non-tender and non-distended without masses, hepatosplenomegaly or hernias noted.  No guarding or rebound tenderness.    Neurologic:  Alert and oriented x3;  grossly normal neurologically. Skin:  Intact without significant lesions or rashes. No jaundice. Lymph Nodes:  No significant cervical adenopathy. Psych: flat  affect.  Imaging Studies: US Abdomen Limited Ruq  Result Date: 01/29/2018 CLINICAL DATA:  62 year old with recent unexplained weight loss, unspecified liver disease and elevated serum alkaline phosphatase. EXAM: ULTRASOUND ABDOMEN LIMITED RIGHT UPPER QUADRANT COMPARISON:  None. FINDINGS: Gallbladder: No shadowing gallstones or echogenic sludge. No gallbladder wall thickening or pericholecystic fluid. Negative sonographic Murphy sign according to the ultrasound technologist. Common bile duct: Diameter: Approximately 2 mm. Liver: Normal size and echotexture without focal parenchymal abnormality. Portal vein is patent on color Doppler imaging with normal direction of blood flow  towards the liver. IMPRESSION: Normal examination. Electronically Signed   By: Evangeline Dakin M.D.   On: 01/29/2018 16:30    Assessment and Plan:   Jeremiah Nguyen is a 62 y.o. y/o male has been referred fo hiccups and unintentional weight loss.  The causes for hiccups are wide. Differentials range from central nervous system causes, to issues with mediastinum, diaphragm.It may also very well be secondary to induced vomiting .  From the GI point of view need to rule out GI tract malignancy , GERD, gastritis.   Plan  1. H pylori breath test  2. Trial of PPI 3. EGD+colonoscopy (colon cancer screening) 4. For unintentional weight loss likely due to induced vomiting, if continues  to lose weight then  suggest CT scan of the chest/abdomen and pelvis. IF hiccups no bettersuggest MRI brain  5. Elevated alkaline phosphotase: Check fractionated, GGT, PTH,Ca,Vitamin D, if suggests from liver then will need liver work up . 6. Suggest psychiatry evaluation for possible bulimia   I have discussed alternative options, risks & benefits,  which include, but are not limited to, bleeding, infection, perforation,respiratory complication & drug reaction.  The patient agrees with this plan & written consent will be obtained.     Follow up in 6 weeks   Dr Jeremiah Bellows MD,MRCP(U.K)

## 2018-02-15 LAB — ALKALINE PHOSPHATASE, ISOENZYMES
Alkaline Phosphatase: 130 IU/L — ABNORMAL HIGH (ref 39–117)
BONE FRACTION: 24 % (ref 12–68)
INTESTINAL FRAC.: 5 % (ref 0–18)
LIVER FRACTION: 71 % (ref 13–88)

## 2018-02-15 LAB — PTH, INTACT AND CALCIUM
CALCIUM: 8.9 mg/dL (ref 8.6–10.2)
PTH: 19 pg/mL (ref 15–65)

## 2018-02-15 LAB — GAMMA GT: GGT: 81 IU/L — AB (ref 0–65)

## 2018-02-18 ENCOUNTER — Other Ambulatory Visit: Payer: Self-pay

## 2018-02-18 ENCOUNTER — Telehealth: Payer: Self-pay

## 2018-02-18 DIAGNOSIS — R748 Abnormal levels of other serum enzymes: Secondary | ICD-10-CM

## 2018-02-18 NOTE — Telephone Encounter (Signed)
-----  Message from Kiran Anna, MD sent at 02/16/2018  6:52 PM EDT -----  - inform GGT elevated and likely cause for elevated alk phos is from the liver as a source . Will need full autoimmune and viral hepatitis Work up.Can check with Ginger for the list of tests to be done and have them ordered.  C/c Kennedy, Lauren Renee, NP   Dr Kiran Anna MD,MRCP (U.K) Gastroenterology/Hepatology Pager: [phone]  

## 2018-02-18 NOTE — Telephone Encounter (Signed)
Spoke with pt caretaker, Ms. Junious Dresser, and informed her of pt lab results and Dr. Georgeann Oppenheim instructions to return to the office for further lab testing. Ms. Junious Dresser agrees to bring pt in this week for lab collection.

## 2018-02-19 NOTE — Addendum Note (Signed)
Addended byGlennie Isle E on: 02/19/2018 10:56 AM   Modules accepted: Orders

## 2018-02-24 ENCOUNTER — Telehealth: Payer: Self-pay

## 2018-02-24 ENCOUNTER — Encounter: Payer: Self-pay | Admitting: Student

## 2018-02-24 ENCOUNTER — Other Ambulatory Visit: Payer: Self-pay

## 2018-02-24 DIAGNOSIS — R945 Abnormal results of liver function studies: Secondary | ICD-10-CM

## 2018-02-24 DIAGNOSIS — R634 Abnormal weight loss: Secondary | ICD-10-CM

## 2018-02-24 NOTE — Telephone Encounter (Signed)
Spoke with pt caretaker, Junious Dresser, and informed her of Dr. Georgeann Oppenheim instructions.  She agrees that pt should be able to have the EGD. She is aware that Mr. Carter will need to be NPO after midnight tonight.

## 2018-02-24 NOTE — Telephone Encounter (Signed)
Called pt caretaker, Junious Dresser, regarding pt upcoming procedure and Dr. Georgeann Oppenheim suggestions. LVM to return call

## 2018-02-24 NOTE — Telephone Encounter (Signed)
-----   Message from [name], MD sent at 02/23/2018  6:48 PM EDT ----- Regarding: RE: Pt unable to prep Please update PCP about this. We can do just the EGD if needed for now if patient willing  Kiran     ----- Message ----- From: Rushie Chestnut, CMA Sent: 02/21/2018   8:24 AM EDT To: [name], MD Subject: Pt unable to prep                              Dr. Vicente Males,  I spoke with pt caretaker Ms. Vickki Muff, she states the pt will not follow the prep for his upcoming endoscopy procedure. He refuses to eat and drink properly on a regular basis, so she is confident the pt will not drink the bowel prep. Also he has visited [facility] twice to have the lab tests you ordered collected and they were unsuccessful attempts.

## 2018-02-25 ENCOUNTER — Encounter
Admission: RE | Disposition: A | Discharge status: Home / Self Care | Payer: Self-pay | Source: Ambulatory Visit | Attending: Gastroenterology

## 2018-02-25 ENCOUNTER — Encounter: Payer: Self-pay | Admitting: Anesthesiology

## 2018-02-25 ENCOUNTER — Ambulatory Visit: Payer: Medicaid Other | Admitting: Certified Registered"

## 2018-02-25 ENCOUNTER — Ambulatory Visit
Admission: RE | Admit: 2018-02-25 | Discharge: 2018-02-25 | Disposition: A | Discharge status: Home / Self Care | Payer: Medicaid Other | Source: Ambulatory Visit | Attending: Gastroenterology | Admitting: Gastroenterology

## 2018-02-25 DIAGNOSIS — F17210 Nicotine dependence, cigarettes, uncomplicated: Secondary | ICD-10-CM | POA: Diagnosis not present

## 2018-02-25 DIAGNOSIS — K219 Gastro-esophageal reflux disease without esophagitis: Secondary | ICD-10-CM | POA: Diagnosis not present

## 2018-02-25 DIAGNOSIS — Z79899 Other long term (current) drug therapy: Secondary | ICD-10-CM | POA: Diagnosis not present

## 2018-02-25 DIAGNOSIS — R569 Unspecified convulsions: Secondary | ICD-10-CM | POA: Insufficient documentation

## 2018-02-25 DIAGNOSIS — G4730 Sleep apnea, unspecified: Secondary | ICD-10-CM | POA: Insufficient documentation

## 2018-02-25 DIAGNOSIS — R634 Abnormal weight loss: Secondary | ICD-10-CM | POA: Diagnosis present

## 2018-02-25 DIAGNOSIS — J449 Chronic obstructive pulmonary disease, unspecified: Secondary | ICD-10-CM | POA: Diagnosis not present

## 2018-02-25 DIAGNOSIS — Z6822 Body mass index (BMI) 22.0-22.9, adult: Secondary | ICD-10-CM | POA: Diagnosis not present

## 2018-02-25 DIAGNOSIS — R066 Hiccough: Secondary | ICD-10-CM

## 2018-02-25 DIAGNOSIS — R748 Abnormal levels of other serum enzymes: Secondary | ICD-10-CM

## 2018-02-25 HISTORY — PX: ESOPHAGOGASTRODUODENOSCOPY (EGD) WITH PROPOFOL: SHX5813

## 2018-02-25 SURGERY — ESOPHAGOGASTRODUODENOSCOPY (EGD) WITH PROPOFOL
Anesthesia: General

## 2018-02-25 MED ORDER — MIDAZOLAM HCL 2 MG/2ML IJ SOLN
INTRAMUSCULAR | Status: AC
  Filled 2018-02-25: qty 2

## 2018-02-25 MED ORDER — LIDOCAINE 2% (20 MG/ML) 5 ML SYRINGE
INTRAMUSCULAR | Status: DC | PRN
  Administered 2018-02-25: 25 mg via INTRAVENOUS

## 2018-02-25 MED ORDER — MIDAZOLAM HCL 5 MG/5ML IJ SOLN
INTRAMUSCULAR | Status: DC | PRN
  Administered 2018-02-25: 2 mg via INTRAVENOUS

## 2018-02-25 MED ORDER — SODIUM CHLORIDE 0.9 % IV SOLN
INTRAVENOUS | Status: DC
  Administered 2018-02-25: 1000 mL via INTRAVENOUS

## 2018-02-25 MED ORDER — PROPOFOL 10 MG/ML IV BOLUS
INTRAVENOUS | Status: DC | PRN
  Administered 2018-02-25: 50 mg via INTRAVENOUS

## 2018-02-25 MED ORDER — GLYCOPYRROLATE 0.2 MG/ML IJ SOLN
INTRAMUSCULAR | Status: DC | PRN
  Administered 2018-02-25: 0.2 mg via INTRAVENOUS

## 2018-02-25 MED ORDER — PROPOFOL 500 MG/50ML IV EMUL
INTRAVENOUS | Status: DC | PRN
  Administered 2018-02-25: 120 ug/kg/min via INTRAVENOUS

## 2018-02-25 NOTE — Op Note (Signed)
[facility] Gastroenterology Patient Name: Jeremiah Nguyen Procedure Date: 02/25/2018 10:26 AM MRN: [id number] Account #: [id number] Date of Birth: [birth year] Admit Type: Outpatient Age: 62 Room: [facility] ENDO ROOM 1 Gender: Male Note Status: Finalized Procedure:            Upper GI endoscopy Indications:          Weight loss Providers:            [name] MD, MD Referring MD:         Cassell Smiles, NP Medicines:            Monitored Anesthesia Care Complications:        No immediate complications. Procedure:            Pre-Anesthesia Assessment:                       - Prior to the procedure, a History and Physical was                        performed, and patient medications, allergies and                        sensitivities were reviewed. The patient's tolerance of                        previous anesthesia was reviewed.                       - The risks and benefits of the procedure and the                        sedation options and risks were discussed with the                        patient. All questions were answered and informed                        consent was obtained.                       - ASA Grade Assessment: III - A patient with severe                        systemic disease.                       After obtaining informed consent, the endoscope was                        passed under direct vision. Throughout the procedure,                        the patient's blood pressure, pulse, and oxygen                        saturations were monitored continuously. The Endoscope                        was introduced through the mouth, and advanced to the  third part of duodenum. The upper GI endoscopy was                        accomplished with ease. The patient tolerated the                        procedure well. Findings:      The esophagus was normal.      The stomach was normal.      The examined duodenum was  normal. Impression:           - Normal esophagus.                       - Normal stomach.                       - Normal examined duodenum.                       - No specimens collected. Recommendation:       - Discharge patient to home (with escort).                       - Resume previous diet.                       - Continue present medications.                       - Return to my office as previously scheduled. Procedure Code(s):    --- Professional ---                       [phone], Esophagogastroduodenoscopy, flexible, transoral;                        diagnostic, including collection of specimen(s) by                        brushing or washing, when performed (separate procedure) Diagnosis Code(s):    --- Professional ---                       R63.4, Abnormal weight loss CPT copyright 2018 American Medical Association. All rights reserved. The codes documented in this report are preliminary and upon coder review may  be revised to meet current compliance requirements. [name], MD [name] MD, MD 02/25/2018 10:40:44 AM This report has been signed electronically. Number of Addenda: 0 Note Initiated On: 02/25/2018 10:26 AM      [facility]

## 2018-02-25 NOTE — Anesthesia Preprocedure Evaluation (Signed)
Anesthesia Evaluation  Patient identified by MRN, date of birth, ID band Patient awake    Reviewed: Allergy & Precautions, H&P , NPO status , Patient's Chart, lab work & pertinent test results  History of Anesthesia Complications Negative for: history of anesthetic complications  Airway Mallampati: II  TM Distance: >3 FB Neck ROM: limited    Dental  (+) Chipped, Missing, Poor Dentition   Pulmonary neg shortness of breath, sleep apnea , COPD, Current Smoker,           Cardiovascular Exercise Tolerance: Good (-) angina(-) Past MI and (-) DOE negative cardio ROS       Neuro/Psych Seizures -, Well Controlled,  PSYCHIATRIC DISORDERS    GI/Hepatic Neg liver ROS, GERD  Medicated and Controlled,  Endo/Other  negative endocrine ROS  Renal/GU negative Renal ROS  negative genitourinary   Musculoskeletal   Abdominal   Peds  Hematology negative hematology ROS (+)   Anesthesia Other Findings Past Medical History: No date: COPD (chronic obstructive pulmonary disease) ([facility]) No date: Eating disorder No date: GERD (gastroesophageal reflux disease) No date: Liver disease No date: Seizures ([place]) No date: Sleep apnea  Past Surgical History: 12-15-13: LESION EXCISION; Left     Comment:  follicular cyst No date: SKIN BIOPSY     Reproductive/Obstetrics negative OB ROS                             Anesthesia Physical Anesthesia Plan  ASA: III  Anesthesia Plan: General   Post-op Pain Management:    Induction: Intravenous  PONV Risk Score and Plan: Propofol infusion and TIVA  Airway Management Planned: Natural Airway and Nasal Cannula  Additional Equipment:   Intra-op Plan:   Post-operative Plan:   Informed Consent: I have reviewed the patients History and Physical, chart, labs and discussed the procedure including the risks, benefits and alternatives for the proposed anesthesia with the  patient or authorized representative who has indicated his/her understanding and acceptance.   Dental Advisory Given  Plan Discussed with: Anesthesiologist, CRNA and Surgeon  Anesthesia Plan Comments: (Patient consented for risks of anesthesia including but not limited to:  - adverse reactions to medications - risk of intubation if required - damage to teeth, lips or other oral mucosa - sore throat or hoarseness - Damage to heart, brain, lungs or loss of life  Patient voiced understanding.)        Anesthesia Quick Evaluation

## 2018-02-25 NOTE — Anesthesia Post-op Follow-up Note (Signed)
Anesthesia QCDR form completed.        

## 2018-02-25 NOTE — Anesthesia Postprocedure Evaluation (Signed)
Anesthesia Post Note  Patient: ANTION ANDRES  Procedure(s) Performed: ESOPHAGOGASTRODUODENOSCOPY (EGD) WITH PROPOFOL (N/A )  Patient location during evaluation: Endoscopy Anesthesia Type: General Level of consciousness: awake and alert Pain management: pain level controlled Vital Signs Assessment: post-procedure vital signs reviewed and stable Respiratory status: spontaneous breathing, nonlabored ventilation, respiratory function stable and patient connected to nasal cannula oxygen Cardiovascular status: blood pressure returned to baseline and stable Postop Assessment: no apparent nausea or vomiting Anesthetic complications: no     Last Vitals:  Vitals:   02/25/18 1103 02/25/18 1114  BP:  123/60  Pulse: 87 92  Resp: 20 15  Temp:  (!) 36.4 C  SpO2: 96% 97%    Last Pain:  Vitals:   02/25/18 1114  TempSrc:   PainSc: 0-No pain                 Precious Haws Adreyan Carbajal

## 2018-02-25 NOTE — Transfer of Care (Signed)
Immediate Anesthesia Transfer of Care Note  [name]  Procedure(s) Performed: ESOPHAGOGASTRODUODENOSCOPY (EGD) WITH PROPOFOL (N/A )  Patient Location: Endoscopy Unit  Anesthesia Type:General  Level of Consciousness: awake  Airway & Oxygen Therapy: Patient Spontanous Breathing and Patient connected to nasal cannula oxygen  Post-op Assessment: Report given to RN and Post -op Vital signs reviewed and stable  Post vital signs: Reviewed  Last Vitals:  Vitals Value Taken Time  BP 87/63 02/25/2018 10:44 AM  Temp 36.4 C 02/25/2018 10:44 AM  Pulse 91 02/25/2018 10:44 AM  Resp 31 02/25/2018 10:44 AM  SpO2 97 % 02/25/2018 10:44 AM  Vitals shown include unvalidated device data.  Last Pain:  Vitals:   02/25/18 0938  TempSrc: Tympanic  PainSc: 0-No pain         Complications: No apparent anesthesia complications

## 2018-02-25 NOTE — H&P (Signed)
[name], MD [address] [place], [place], [place], [address] [address], [place], [place], [place], [address] Phone: [phone]  Fax: [phone]  Primary Care Physician:  Mikey College, NP   Pre-Procedure History & Physical: HPI:  Jeremiah Nguyen is a 62 y.o. male is here for an endoscopy    Past Medical History:  Diagnosis Date  . COPD (chronic obstructive pulmonary disease) ([place])   . Eating disorder   . GERD (gastroesophageal reflux disease)   . Liver disease   . Seizures ([place])   . Sleep apnea     Past Surgical History:  Procedure Laterality Date  . LESION EXCISION Left 06-17-76   follicular cyst  . SKIN BIOPSY      Prior to Admission medications   Medication Sig Start Date End Date Taking? Authorizing Provider  acetaminophen (TYLENOL) 325 MG tablet Take 1 tablet (325 mg total) by mouth every 4 (four) hours as needed. 06/12/17  Yes Mikey College, NP  baclofen (LIORESAL) 10 MG tablet Take 10 mg by mouth 4 (four) times daily.    Yes [provider]  buPROPion (WELLBUTRIN XL) 150 MG 24 hr tablet Take 150 mg by mouth daily.   Yes [provider]  chlorproMAZINE (THORAZINE) 50 MG tablet Take 50 mg by mouth 4 (four) times daily as needed.   Yes [provider]  mirtazapine (REMERON) 15 MG tablet Take 15 mg by mouth at bedtime.   Yes [provider]  omeprazole (PRILOSEC) 40 MG capsule Take 1 capsule (40 mg total) by mouth daily. 02/11/18  Yes [name], MD  PHENObarbital (LUMINAL) 60 MG tablet Take 1 tablet (60 mg total) by mouth at bedtime. 01/17/18  Yes Mikey College, NP  phenytoin (DILANTIN) 100 MG ER capsule Take 200 mg by mouth 2 (two) times daily.   Yes [provider]  Potassium Chloride ER 20 MEQ TBCR Take 20 mEq by mouth daily. 01/07/18  Yes Mikey College, NP  protein supplement (UNJURY CHOCOLATE CLASSIC) POWD Take 7 g (2 oz total) by mouth daily with lunch. 12/31/17  Yes  Mikey College, NP  sennosides-docusate sodium (SENOKOT-S) 8.6-50 MG tablet Take 1 tablet by mouth 2 (two) times daily. 07/17/17  Yes Mikey College, NP  traZODone (DESYREL) 100 MG tablet Take 100 mg by mouth at bedtime.   Yes [provider]    Allergies as of 02/11/2018  . (No Known Allergies)    Family History  Problem Relation Age of Onset  . Cancer Mother        breast    Social History   Socioeconomic History  . Marital status: Single    Spouse name: Not on file  . Number of children: Not on file  . Years of education: Not on file  . Highest education level: Not on file  Occupational History  . Not on file  Social Needs  . Financial resource strain: Not on file  . Food insecurity:    Worry: Not on file    Inability: Not on file  . Transportation needs:    Medical: Not on file    Non-medical: Not on file  Tobacco Use  . Smoking status: Current Every Day Smoker    Packs/day: 1.00    Years: 30.00    Pack years: 30.00  . Smokeless tobacco: Never Used  Substance and Sexual Activity  . Alcohol use: No  . Drug use: No  . Sexual  activity: Not on file  Lifestyle  . Physical activity:    Days per week: Not on file    Minutes per session: Not on file  . Stress: Not on file  Relationships  . Social connections:    Talks on phone: Not on file    Gets together: Not on file    Attends religious service: Not on file    Active member of club or organization: Not on file    Attends meetings of clubs or organizations: Not on file    Relationship status: Not on file  . Intimate partner violence:    Fear of current or ex partner: Not on file    Emotionally abused: Not on file    Physically abused: Not on file    Forced sexual activity: Not on file  Other Topics Concern  . Not on file  Social History Narrative  . Not on file    Review of Systems: See HPI, otherwise negative ROS  Physical Exam: BP (!) 129/59   Pulse 73   Temp (!) 96.5 F  (35.8 C) (Tympanic)   Resp 20   Ht 5\' 6"  (1.676 m)   Wt 62.1 kg   SpO2 99%   BMI 22.11 kg/m  General:   Alert,  pleasant and cooperative in NAD Head:  Normocephalic and atraumatic. Neck:  Supple; no masses or thyromegaly. Lungs:  Clear throughout to auscultation, normal respiratory effort.    Heart:  +S1, +S2, Regular rate and rhythm, No edema. Abdomen:  Soft, nontender and nondistended. Normal bowel sounds, without guarding, and without rebound.   Neurologic:  Alert and  oriented x4;  grossly normal neurologically.  Impression/Plan: Jeremiah Nguyen is here for an endoscopy  to be performed for  evaluation of unintentional weight loss    Risks, benefits, limitations, and alternatives regarding endoscopy have been reviewed with the patient.  Questions have been answered.  All parties agreeable.   [name], MD  02/25/2018, 10:19 AM

## 2018-02-26 ENCOUNTER — Encounter: Payer: Self-pay | Admitting: Gastroenterology

## 2018-03-06 ENCOUNTER — Other Ambulatory Visit: Payer: Self-pay

## 2018-03-06 ENCOUNTER — Other Ambulatory Visit: Payer: Self-pay | Admitting: Nurse Practitioner

## 2018-03-06 DIAGNOSIS — G40909 Epilepsy, unspecified, not intractable, without status epilepticus: Secondary | ICD-10-CM

## 2018-03-06 MED ORDER — CHLORPROMAZINE HCL 50 MG PO TABS: 50.0000 mg | ORAL_TABLET | Freq: Four times a day (QID) | ORAL | 5 refills | Status: DC | PRN

## 2018-03-06 MED ORDER — PHENYTOIN SODIUM EXTENDED 100 MG PO CAPS: 200.0000 mg | ORAL_CAPSULE | Freq: Two times a day (BID) | ORAL | 5 refills | Status: DC

## 2018-03-06 MED ORDER — MIRTAZAPINE 15 MG PO TABS: 15.0000 mg | ORAL_TABLET | Freq: Every day | ORAL | 5 refills | Status: DC

## 2018-03-06 MED ORDER — TRAZODONE HCL 100 MG PO TABS: 100.0000 mg | ORAL_TABLET | Freq: Every day | ORAL | 5 refills | Status: DC

## 2018-03-17 ENCOUNTER — Ambulatory Visit: Payer: Medicaid Other

## 2018-03-26 ENCOUNTER — Other Ambulatory Visit: Payer: Self-pay

## 2018-03-26 ENCOUNTER — Other Ambulatory Visit
Admission: RE | Admit: 2018-03-26 | Discharge: 2018-03-26 | Disposition: A | Discharge status: Home / Self Care | Payer: Medicaid Other | Source: Ambulatory Visit | Attending: Gastroenterology | Admitting: Gastroenterology

## 2018-03-26 ENCOUNTER — Ambulatory Visit: Payer: Medicaid Other | Admitting: Gastroenterology

## 2018-03-26 ENCOUNTER — Encounter: Payer: Self-pay | Admitting: Gastroenterology

## 2018-03-26 VITALS — BP 104/56 | HR 84 | Ht 69.0 in | Wt 139.2 lb

## 2018-03-26 DIAGNOSIS — R634 Abnormal weight loss: Secondary | ICD-10-CM | POA: Insufficient documentation

## 2018-03-26 DIAGNOSIS — R945 Abnormal results of liver function studies: Secondary | ICD-10-CM

## 2018-03-26 DIAGNOSIS — F5089 Other specified eating disorder: Secondary | ICD-10-CM | POA: Diagnosis present

## 2018-03-26 DIAGNOSIS — R066 Hiccough: Secondary | ICD-10-CM | POA: Diagnosis present

## 2018-03-26 LAB — COMPREHENSIVE METABOLIC PANEL
ALK PHOS: 102 U/L (ref 38–126)
ALT: 15 U/L (ref 0–44)
AST: 24 U/L (ref 15–41)
Albumin: 3.9 g/dL (ref 3.5–5.0)
Anion gap: 8 (ref 5–15)
BUN: 9 mg/dL (ref 8–23)
CALCIUM: 9.3 mg/dL (ref 8.9–10.3)
CHLORIDE: 102 mmol/L (ref 98–111)
CO2: 30 mmol/L (ref 22–32)
CREATININE: 0.76 mg/dL (ref 0.61–1.24)
Glucose, Bld: 79 mg/dL (ref 70–99)
Potassium: 3.9 mmol/L (ref 3.5–5.1)
Sodium: 140 mmol/L (ref 135–145)
Total Bilirubin: 0.8 mg/dL (ref 0.3–1.2)
Total Protein: 7.1 g/dL (ref 6.5–8.1)

## 2018-03-26 LAB — CBC WITH DIFFERENTIAL/PLATELET
Abs Immature Granulocytes: 0.02 10*3/uL (ref 0.00–0.07)
BASOS PCT: 2 %
Basophils Absolute: 0.1 10*3/uL (ref 0.0–0.1)
EOS ABS: 0.1 10*3/uL (ref 0.0–0.5)
EOS PCT: 3 %
HCT: 42.4 % (ref 39.0–52.0)
HEMOGLOBIN: 14.1 g/dL (ref 13.0–17.0)
Immature Granulocytes: 0 %
Lymphocytes Relative: 25 %
Lymphs Abs: 1.3 10*3/uL (ref 0.7–4.0)
MCH: 30.2 pg (ref 26.0–34.0)
MCHC: 33.3 g/dL (ref 30.0–36.0)
MCV: 90.8 fL (ref 80.0–100.0)
Monocytes Absolute: 0.6 10*3/uL (ref 0.1–1.0)
Monocytes Relative: 10 %
Neutro Abs: 3.3 10*3/uL (ref 1.7–7.7)
Neutrophils Relative %: 60 %
PLATELETS: 256 10*3/uL (ref 150–400)
RBC: 4.67 MIL/uL (ref 4.22–5.81)
RDW: 15.8 % — AB (ref 11.5–15.5)
WBC: 5.5 10*3/uL (ref 4.0–10.5)
nRBC: 0 % (ref 0.0–0.2)

## 2018-03-26 LAB — FERRITIN: FERRITIN: 49 ng/mL (ref 24–336)

## 2018-03-26 LAB — IRON AND TIBC
Iron: 242 ug/dL — ABNORMAL HIGH (ref 45–182)
SATURATION RATIOS: 87 % — AB (ref 17.9–39.5)
TIBC: 279 ug/dL (ref 250–450)
UIBC: 37 ug/dL

## 2018-03-26 LAB — C-REACTIVE PROTEIN

## 2018-03-26 NOTE — Progress Notes (Signed)
Jeremiah Bellows MD, MRCP(U.K) [address]  [place], [place] [address]  Main: [phone]  Fax: [phone]   Primary Care Physician: Mikey College, NP  Primary Gastroenterologist:  Dr. Jonathon Nguyen   Chief Complaint  Patient presents with  . Follow-up    Hiccups, weight loss    HPI: Jeremiah Nguyen is a 62 y.o. male   Summary of history :  He was initially referred and seen on 02/11/2018 for weight loss and hiccups.  Labs 01/03/18: Alk phos elevated at 152, HIV ,Hep C ab negative. CBC,TSH-normal .HBa1c normal  .   He stated that he lost 30 lbs since jan 2019 .He has hiccups, on and off, occurs every day , each episode lasts till he throws up or goes to bed. . This has been ongoing for many years. No change recently . He has issues with balance. IF he is walking , can loose balanceHas had issues with a clogged ear in the past and says he was seen by ENT and his left ear was completely blocked and had it cleaned. Hiccups did not get any better.    He is here with his sister who says that he induces vomiting when he thinks he gains weight., he was actually showing me the steps he performs to induce vomiting.   Interval history   02/11/2018 to 03/26/2018 At his last visit we had ordered labs to evaluate his elevated alkaline phosphatase none of them have been obtained.  02/12/2018 alkaline phosphatase 130 isoenzymes are all within normal limits.  Gamma GT 81, PTH normal  Lost 1 lb sincelast visit   At his last visit we suggested he undergo an EGD and colonoscopy as he had weight loss, the call back and stated that he cannot do the colonoscopy because he could not do the prep.  I performed an upper endoscopy on 02/25/2018.  No abnormality was seen on upper endoscopy.  CT scan of the chest abdomen pelvis was ordered but has not been performed yet as it was declined by his insurance   Still induces vomiting. No issues with hearing today. Still off balance.  Takes Prilosec - no significant difference. Refuses protein shakes, no drinking much fluids.   He is here with a caregiver who states he refuses to swallow his food at times and spits it out. Patient does not state why he does not eat . Says he does not know   Current Outpatient Medications  Medication Sig Dispense Refill  . acetaminophen (TYLENOL) 325 MG tablet Take 1 tablet (325 mg total) by mouth every 4 (four) hours as needed. 60 tablet 0  . baclofen (LIORESAL) 10 MG tablet Take 10 mg by mouth 4 (four) times daily.     Marland Kitchen buPROPion (WELLBUTRIN XL) 150 MG 24 hr tablet Take 150 mg by mouth daily.    . chlorproMAZINE (THORAZINE) 50 MG tablet Take 1 tablet (50 mg total) by mouth 4 (four) times daily as needed. 120 tablet 5  . mirtazapine (REMERON) 15 MG tablet Take 1 tablet (15 mg total) by mouth at bedtime. 30 tablet 5  . omeprazole (PRILOSEC) 40 MG capsule Take 1 capsule (40 mg total) by mouth daily. 90 capsule 3  . PHENObarbital (LUMINAL) 60 MG tablet TAKE 1 TABLET BY MOUTH AT BEDTIME 30 tablet 2  . phenytoin (DILANTIN) 100 MG ER capsule Take 2 capsules (200 mg total) by mouth 2 (two) times daily. 120 capsule 5  . Potassium Chloride ER  20 MEQ TBCR Take 20 mEq by mouth daily. 30 tablet 2  . sennosides-docusate sodium (SENOKOT-S) 8.6-50 MG tablet Take 1 tablet by mouth 2 (two) times daily. 60 tablet 5  . traZODone (DESYREL) 100 MG tablet Take 1 tablet (100 mg total) by mouth at bedtime. 30 tablet 5  . Protein (UNJURY) POWD MIX 7GM (2 OUNCES TOTAL) INTO MILK OR WATER, THEN TAKE BY MOUTH EVERYDAY WITH LUNCH (Patient not taking: Reported on 03/26/2018) 454 g 0  . protein supplement (UNJURY CHOCOLATE CLASSIC) POWD Take 7 g (2 oz total) by mouth daily with lunch. (Patient not taking: Reported on 03/26/2018) 420 g 2   No current facility-administered medications for this visit.     Allergies as of 03/26/2018  . (No Known Allergies)    ROS:  General: Negative for anorexia, weight loss, fever,  chills, fatigue, weakness. ENT: Negative for hoarseness, difficulty swallowing , nasal congestion. CV: Negative for chest pain, angina, palpitations, dyspnea on exertion, peripheral edema.  Respiratory: Negative for dyspnea at rest, dyspnea on exertion, cough, sputum, wheezing.  GI: See history of present illness. GU:  Negative for dysuria, hematuria, urinary incontinence, urinary frequency, nocturnal urination.  Endo: Negative for unusual weight change.    Physical Examination:   BP (!) 104/56   Pulse 84   Ht '5\' 9"'  (1.753 m)   Wt 139 lb 3.2 oz (63.1 kg)   BMI 20.56 kg/m   General:appears comfortable not in distress Eyes: No icterus. Conjunctivae pink. Mouth: Oropharyngeal mucosa moist and pink , no lesions erythema or exudate. Lungs: Clear to auscultation bilaterally. Non-labored. Heart: Regular rate and rhythm, no murmurs rubs or gallops.  Abdomen: Bowel sounds are normal, nontender, nondistended, no hepatosplenomegaly or masses, no abdominal bruits or hernia , no rebound or guarding.   Extremities: No lower extremity edema. No clubbing or deformities. Neuro: Alert and oriented x 3.  Grossly intact. Skin: Warm and dry, no jaundice.   Psych: flat affect    Imaging Studies: No results found.  Assessment and Plan:   Jeremiah Nguyen is a 62 y.o. y/o male here to follow up for hiccups,  and unintentional weight loss.  The causes for hiccups are wide. Differentials range from central nervous system causes, to issues with mediastinum, diaphragm.It may also very well be secondary to induced vomiting Very likely self induced vomiting leading to weight loss and hiccups too. This is most likely an issue which needs to be seen for by a Psychiatrist.   Plan  1. H pylori breath test  2. PPI 3. For unintentional weight loss likely due to self  induced vomiting : CT scan of the chest/abdomen and pelvis.Will appeal to insurance to authorise.  MRI brain to evaluate hiccups and balance  issues  4. Elevated alkaline phosphotase: Check all autoimmune labs which were ordered previously but he had not obtained.. 5. Psychiatry evaluation for possible bulimia   Dr Jeremiah Bellows  MD,MRCP [facility]) Follow up in 3 months

## 2018-03-27 LAB — GLIA (IGA/G) + TTG IGA
Antigliadin Abs, IgA: 7 units (ref 0–19)
Gliadin IgG: 4 units (ref 0–19)
Tissue Transglutaminase Ab, IgA: <2 U/mL (ref 0–3)

## 2018-03-27 LAB — ANTI-SMOOTH MUSCLE ANTIBODY, IGG: F-ACTIN AB IGG: 5 U (ref 0–19)

## 2018-03-27 LAB — HEPATITIS B SURFACE ANTIGEN: Hepatitis B Surface Ag: NEGATIVE

## 2018-03-27 LAB — IGG, IGA, IGM
IgA: 363 mg/dL (ref 61–437)
IgG (Immunoglobin G), Serum: 887 mg/dL (ref 700–1600)
IgM (Immunoglobulin M), Srm: 29 mg/dL (ref 20–172)

## 2018-03-27 LAB — MITOCHONDRIAL ANTIBODIES

## 2018-03-27 LAB — HEPATITIS A ANTIBODY, TOTAL: Hep A Total Ab: POSITIVE — AB

## 2018-03-27 LAB — HEPATITIS B CORE ANTIBODY, TOTAL: Hep B Core Total Ab: NEGATIVE

## 2018-03-27 LAB — HEPATITIS B E ANTIGEN: HEP B E AG: POSITIVE — AB

## 2018-03-27 LAB — HEPATITIS B SURFACE ANTIBODY, QUANTITATIVE: Hepatitis B-Post: <3.1 m[IU]/mL — ABNORMAL LOW (ref >9.9)

## 2018-03-27 LAB — CERULOPLASMIN: CERULOPLASMIN: 28.9 mg/dL (ref 16.0–31.0)

## 2018-03-28 ENCOUNTER — Other Ambulatory Visit: Payer: Self-pay | Admitting: Nurse Practitioner

## 2018-03-28 DIAGNOSIS — R634 Abnormal weight loss: Secondary | ICD-10-CM

## 2018-03-28 DIAGNOSIS — E441 Mild protein-calorie malnutrition: Secondary | ICD-10-CM

## 2018-03-28 DIAGNOSIS — F5089 Other specified eating disorder: Secondary | ICD-10-CM

## 2018-03-28 LAB — HEPATITIS B E ANTIBODY: HEP B E AB: NEGATIVE

## 2018-03-28 LAB — H. PYLORI BREATH TEST: H pylori Breath Test: NEGATIVE

## 2018-03-31 LAB — ALPHA-1 ANTITRYPSIN PHENOTYPE: A1 ANTITRYPSIN SER: 164 mg/dL (ref 101–187)

## 2018-04-03 ENCOUNTER — Ambulatory Visit: Payer: Medicaid Other | Admitting: Nurse Practitioner

## 2018-04-14 ENCOUNTER — Telehealth: Payer: Self-pay | Admitting: Gastroenterology

## 2018-04-14 ENCOUNTER — Ambulatory Visit: Payer: Medicaid Other

## 2018-04-14 NOTE — Telephone Encounter (Signed)
Family care home is concerned the patient won't be able to sit still for 45 min during the MRI. Would like to know if Dr. Vicente Males can prescribed Jeremiah Nguyen a medication to keep him calm? MRI is this [place] and req by Dr. Vicente Males. Pls call

## 2018-04-16 NOTE — Telephone Encounter (Signed)
Spoke with pt caretaker, Junious Dresser, and informed her that we've called in Valium 5mg  to pt preferred pharmacy. She is aware that pt needs to take the single-dose Valium one hour prior to the MRI.

## 2018-04-18 ENCOUNTER — Ambulatory Visit
Admission: RE | Admit: 2018-04-18 | Discharge: 2018-04-18 | Disposition: A | Discharge status: Home / Self Care | Payer: Medicaid Other | Source: Ambulatory Visit | Attending: Gastroenterology | Admitting: Gastroenterology

## 2018-04-18 DIAGNOSIS — R9082 White matter disease, unspecified: Secondary | ICD-10-CM | POA: Diagnosis not present

## 2018-04-18 DIAGNOSIS — R066 Hiccough: Secondary | ICD-10-CM

## 2018-04-18 DIAGNOSIS — G319 Degenerative disease of nervous system, unspecified: Secondary | ICD-10-CM | POA: Diagnosis not present

## 2018-04-18 MED ORDER — GADOBUTROL 1 MMOL/ML IV SOLN
6.0000 mL | Freq: Once | INTRAVENOUS | Status: AC | PRN
  Administered 2018-04-18: 6 mL via INTRAVENOUS

## 2018-04-22 ENCOUNTER — Encounter: Payer: Self-pay | Admitting: Gastroenterology

## 2018-04-23 ENCOUNTER — Other Ambulatory Visit: Payer: Self-pay

## 2018-04-23 ENCOUNTER — Telehealth: Payer: Self-pay

## 2018-04-23 DIAGNOSIS — R945 Abnormal results of liver function studies: Secondary | ICD-10-CM

## 2018-04-23 NOTE — Telephone Encounter (Signed)
-----   Message from [name], MD sent at 04/23/2018 11:08 AM EST ----- Sherald Hess inform Hepatitis B e antigen is positive -he may have hepatitis B. Need more labs - check hepatitis B viral load, hepatitis B core IGM, Hepatitis B delta antigen   Josefina Do, Jerrel Ivory, NP   Dr [name] MD,MRCP [facility]) Gastroenterology/Hepatology Pager: [phone]

## 2018-04-23 NOTE — Telephone Encounter (Signed)
Spoke with pt caretaker, Junious Dresser, and informed her of pt lab results on Dr. Georgeann Oppenheim instructions for pt to have additional lab test. I explained that the additional tests have been ordered. She plans to take pt to [facility] for the lab collection.

## 2018-05-08 ENCOUNTER — Other Ambulatory Visit: Payer: Self-pay | Admitting: Nurse Practitioner

## 2018-05-08 DIAGNOSIS — R066 Hiccough: Secondary | ICD-10-CM

## 2018-05-08 DIAGNOSIS — F5002 Anorexia nervosa, binge eating/purging type: Secondary | ICD-10-CM

## 2018-05-09 DIAGNOSIS — F509 Eating disorder, unspecified: Secondary | ICD-10-CM | POA: Insufficient documentation

## 2018-05-30 ENCOUNTER — Ambulatory Visit (INDEPENDENT_AMBULATORY_CARE_PROVIDER_SITE_OTHER): Payer: Medicaid Other | Admitting: Nurse Practitioner

## 2018-05-30 ENCOUNTER — Encounter: Payer: Self-pay | Admitting: Nurse Practitioner

## 2018-05-30 ENCOUNTER — Other Ambulatory Visit: Payer: Self-pay

## 2018-05-30 VITALS — BP 100/53 | HR 92 | Temp 99.4°F | Ht 69.0 in | Wt 130.4 lb

## 2018-05-30 DIAGNOSIS — G40909 Epilepsy, unspecified, not intractable, without status epilepticus: Secondary | ICD-10-CM

## 2018-05-30 DIAGNOSIS — Z593 Problems related to living in residential institution: Secondary | ICD-10-CM | POA: Diagnosis not present

## 2018-05-30 DIAGNOSIS — Z79899 Other long term (current) drug therapy: Secondary | ICD-10-CM | POA: Diagnosis not present

## 2018-05-30 DIAGNOSIS — F5002 Anorexia nervosa, binge eating/purging type: Secondary | ICD-10-CM | POA: Diagnosis not present

## 2018-05-30 NOTE — Patient Instructions (Addendum)
Jeremiah Nguyen,   Thank you for coming in to clinic today.  1. Continue medications as are on FL2 today.    2. Focus on not rubbing the front of your throat to cause vomiting.  Your vomiting may be worsening your hiccups.  Please schedule a follow-up appointment with Cassell Smiles, AGNP. Return in about 3 months (around 08/29/2018) for seizures, self-induced vomiting, hiccups.  If you have any other questions or concerns, please feel free to call the clinic or send a message through [place]. You may also schedule an earlier appointment if necessary.  You will receive a survey after today's visit either digitally by e-mail or paper by [employer]. Your experiences and feedback matter to [place].  Please respond so we know how we are doing as we provide care for you.   Cassell Smiles, DNP, AGNP-BC Adult Gerontology Nurse Practitioner [place]

## 2018-05-30 NOTE — Progress Notes (Signed)
Subjective:    Patient ID: Jeremiah Nguyen, male    DOB: September 12, 1955, 63 y.o.   MRN: [id number]  KELLEN DUTCH is a 63 y.o. male presenting on 05/30/2018 for Consult [facility] )  Junious Dresser accompanies patient to clinic today.  She is Freight forwarder of patient's residence Risk manager).  HPI FL2 renewal Patient continues to reside in [place] with Junious Dresser.  He will continue residence here.  He is independent with all ADLs, but requires regular supervision for safety.    Intractable hiccups and self-induced vomiting Patient states vomiting helps relieve hiccups.  Patient massages front of throat.   Feels at times he doesn't need his food.  He states the food tastes good.  He is not admitting to bulimia today with intentional vomiting due to weight loss/body image difficulties.  A portion of this HPI is obtained 1:1 with patient. - Patient has appointment with psychiatry this month locally. - Patient notes no improvement of hiccups currently with ongoing use of chlorpromazine.   Seizure disorder No regular seizure activity noted by Junious Dresser or any other house staff.  Patient continues on dilantin and phenobarbital.    Social History   Tobacco Use  . Smoking status: Current Every Day Smoker    Packs/day: 1.00    Years: 30.00    Pack years: 30.00  . Smokeless tobacco: Never Used  Substance Use Topics  . Alcohol use: No  . Drug use: No    Review of Systems Per HPI unless specifically indicated above     Objective:    BP (!) 100/53 (BP Location: Right Arm, Patient Position: Sitting, Cuff Size: Normal)   Pulse 92   Temp 99.4 F (37.4 C) (Oral)   Ht 5\' 9"  (1.753 m)   Wt 130 lb 6.4 oz (59.1 kg)   BMI 19.26 kg/m   Wt Readings from Last 3 Encounters:  05/30/18 130 lb 6.4 oz (59.1 kg)  03/26/18 139 lb 3.2 oz (63.1 kg)  02/25/18 137 lb (62.1 kg)    Physical Exam Vitals signs reviewed.  Constitutional:      General: He is not in acute distress.    Appearance: He is well-developed.  HENT:     Head: Normocephalic and atraumatic.  Cardiovascular:     Rate and Rhythm: Normal rate and regular rhythm.     Pulses:          Radial pulses are 2+ on the right side and 2+ on the left side.       Posterior tibial pulses are 1+ on the right side and 1+ on the left side.     Heart sounds: Normal heart sounds, S1 normal and S2 normal.  Pulmonary:     Effort: Pulmonary effort is normal. No respiratory distress.     Breath sounds: Normal breath sounds and air entry.  Musculoskeletal:     Right lower leg: No edema.     Left lower leg: No edema.  Skin:    General: Skin is warm and dry.     Capillary Refill: Capillary refill takes less than 2 seconds.  Neurological:     Mental Status: He is alert and oriented to person, place, and time.     Cranial Nerves: No cranial nerve deficit.     Sensory: No sensory deficit.     Gait: Gait normal.     Deep Tendon Reflexes: Reflexes are normal and symmetric.  Psychiatric:        Attention and Perception: Attention normal.  Mood and Affect: Mood and affect normal.        Behavior: Behavior is withdrawn (pt refuses to remove sunglasses). Behavior is cooperative.     Results for orders placed or performed during the hospital encounter of 03/26/18  Hepatitis B E antigen  Result Value Ref Range   Hep B E Ag Positive (A) Negative  Hepatitis B e antibody  Result Value Ref Range   Hep B E Ab Negative Negative  Hepatitis B core antibody, total  Result Value Ref Range   Hep B Core Total Ab Negative Negative  IgG, IgA, IgM  Result Value Ref Range   IgG (Immunoglobin G), Serum 887 700 - 1,600 mg/dL   IgA 363 61 - 437 mg/dL   IgM (Immunoglobulin M), Srm 29 20 - 172 mg/dL  Iron and TIBC  Result Value Ref Range   Iron 242 (H) 45 - 182 ug/dL   TIBC 279 250 - 450 ug/dL   Saturation Ratios 87 (H) 17.9 - 39.5 %   UIBC 37 ug/dL  CBC with Differential/Platelet  Result Value Ref Range   WBC 5.5 4.0 - 10.5 K/uL   RBC 4.67 4.22 - 5.81 MIL/uL    Hemoglobin 14.1 13.0 - 17.0 g/dL   HCT 42.4 39.0 - 52.0 %   MCV 90.8 80.0 - 100.0 fL   MCH 30.2 26.0 - 34.0 pg   MCHC 33.3 30.0 - 36.0 g/dL   RDW 15.8 (H) 11.5 - 15.5 %   Platelets 256 150 - 400 K/uL   nRBC 0.0 0.0 - 0.2 %   Neutrophils Relative % 60 %   Neutro Abs 3.3 1.7 - 7.7 K/uL   Lymphocytes Relative 25 %   Lymphs Abs 1.3 0.7 - 4.0 K/uL   Monocytes Relative 10 %   Monocytes Absolute 0.6 0.1 - 1.0 K/uL   Eosinophils Relative 3 %   Eosinophils Absolute 0.1 0.0 - 0.5 K/uL   Basophils Relative 2 %   Basophils Absolute 0.1 0.0 - 0.1 K/uL   Immature Granulocytes 0 %   Abs Immature Granulocytes 0.02 0.00 - 0.07 K/uL  Ferritin  Result Value Ref Range   Ferritin 49 24 - 336 ng/mL  Hepatitis A antibody, total  Result Value Ref Range   Hep A Total Ab Positive (A) Negative  Hepatitis B surface antigen  Result Value Ref Range   Hepatitis B Surface Ag Negative Negative  Anti-smooth muscle antibody, IgG  Result Value Ref Range   F-Actin IgG 5 0 - 19 Units  Hepatitis B surface antibody  Result Value Ref Range   Hepatitis B-Post <3.1 (L) Immunity>9.9 mIU/mL  Comprehensive metabolic panel  Result Value Ref Range   Sodium 140 135 - 145 mmol/L   Potassium 3.9 3.5 - 5.1 mmol/L   Chloride 102 98 - 111 mmol/L   CO2 30 22 - 32 mmol/L   Glucose, Bld 79 70 - 99 mg/dL   BUN 9 8 - 23 mg/dL   Creatinine, Ser 0.76 0.61 - 1.24 mg/dL   Calcium 9.3 8.9 - 10.3 mg/dL   Total Protein 7.1 6.5 - 8.1 g/dL   Albumin 3.9 3.5 - 5.0 g/dL   AST 24 15 - 41 U/L   ALT 15 0 - 44 U/L   Alkaline Phosphatase 102 38 - 126 U/L   Total Bilirubin 0.8 0.3 - 1.2 mg/dL   GFR calc non Af Amer >60 >60 mL/min   GFR calc Af Amer >60 >60 mL/min   Anion gap  8 5 - 15  Ceruloplasmin  Result Value Ref Range   Ceruloplasmin 28.9 16.0 - 31.0 mg/dL  Alpha-1 antitrypsin phenotype  Result Value Ref Range   A-1 Antitrypsin Pheno MM    A-1 Antitrypsin, Ser 164 101 - 187 mg/dL  Mitochondrial antibodies  Result Value Ref  Range   Mitochondrial M2 Ab, IgG <20.0 0.0 - 20.0 Units  Glia (IgA/G) + tTG IgA  Result Value Ref Range   Antigliadin Abs, IgA 7 0 - 19 units   Gliadin IgG 4 0 - 19 units   Tissue Transglutaminase Ab, IgA <2 0 - 3 U/mL  C-reactive protein  Result Value Ref Range   CRP <0.8 <1.0 mg/dL      Assessment & Plan:   Problem List Items Addressed This Visit      Nervous and Auditory   Seizure disorder ([place]) Status unknown.  Recheck labs.  Continue meds without changes today.  Refills provided. Followup with refills and med adjustments prn after labs.    Relevant Orders   Phenobarbital level   Dilantin (Phenytoin) level, total     Other   Eating disorder Patient with intractable hiccups and self-induced vomiting after binge eating.  Patient claims self-induced vomiting is due to relieving hiccups.  Psychiatric eval pending.  Encourage ongoing monitoring of patient after meals in domiciliary.   - Patient wasting/dumping protein shake.  May stop providing this to patient at this time.    Person living in residential institution    Patient is long-term resident of domicilary due to cognitive dysfunction, seizure disorder.  He presents today for renewal of FL2 without any significant changes to functional status.  FL2 is reviewed, signed.       Other Visit Diagnoses    High risk medication use    -  Primary Needs labs for levels of phenobarbital and dilantin.  [facility] Dr. Vicente Males.  Draw at that time.     Relevant Orders   Phenobarbital level   Dilantin (Phenytoin) level, total      Follow up plan: Return in about 3 months (around 08/29/2018) for seizures, self-induced vomiting, hiccups.  Cassell Smiles, DNP, AGPCNP-BC Adult Gerontology Primary Care Nurse Practitioner [facility] 05/30/2018, 2:42 PM

## 2018-06-02 ENCOUNTER — Encounter: Payer: Self-pay | Admitting: Nurse Practitioner

## 2018-06-02 DIAGNOSIS — Z593 Problems related to living in residential institution: Secondary | ICD-10-CM | POA: Insufficient documentation

## 2018-06-02 NOTE — Assessment & Plan Note (Addendum)
Patient is long-term resident of domicilary due to cognitive dysfunction, seizure disorder.  He presents today for renewal of FL2 without any significant changes to functional status.  FL2 is reviewed, signed.

## 2018-06-06 ENCOUNTER — Ambulatory Visit: Payer: Self-pay | Admitting: Psychiatry

## 2018-06-15 ENCOUNTER — Encounter: Payer: Self-pay | Admitting: Emergency Medicine

## 2018-06-15 ENCOUNTER — Emergency Department
Admission: EM | Admit: 2018-06-15 | Discharge: 2018-06-15 | Disposition: A | Discharge status: Home / Self Care | Payer: Medicaid Other | Attending: Emergency Medicine | Admitting: Emergency Medicine

## 2018-06-15 ENCOUNTER — Other Ambulatory Visit: Payer: Self-pay

## 2018-06-15 DIAGNOSIS — G40909 Epilepsy, unspecified, not intractable, without status epilepticus: Secondary | ICD-10-CM

## 2018-06-15 DIAGNOSIS — F17200 Nicotine dependence, unspecified, uncomplicated: Secondary | ICD-10-CM | POA: Diagnosis not present

## 2018-06-15 DIAGNOSIS — Z79899 Other long term (current) drug therapy: Secondary | ICD-10-CM | POA: Insufficient documentation

## 2018-06-15 DIAGNOSIS — J449 Chronic obstructive pulmonary disease, unspecified: Secondary | ICD-10-CM | POA: Diagnosis not present

## 2018-06-15 LAB — BASIC METABOLIC PANEL
Anion gap: 8 (ref 5–15)
BUN: 7 mg/dL — ABNORMAL LOW (ref 8–23)
CHLORIDE: 104 mmol/L (ref 98–111)
CO2: 27 mmol/L (ref 22–32)
Calcium: 9.1 mg/dL (ref 8.9–10.3)
Creatinine, Ser: 0.81 mg/dL (ref 0.61–1.24)
GFR calc Af Amer: >60 mL/min (ref >60)
GFR calc non Af Amer: >60 mL/min (ref >60)
Glucose, Bld: 71 mg/dL (ref 70–99)
POTASSIUM: 3.8 mmol/L (ref 3.5–5.1)
Sodium: 139 mmol/L (ref 135–145)

## 2018-06-15 LAB — CBC WITH DIFFERENTIAL/PLATELET
Abs Immature Granulocytes: 0.01 10*3/uL (ref 0.00–0.07)
BASOS ABS: 0.1 10*3/uL (ref 0.0–0.1)
Basophils Relative: 1 %
EOS ABS: 0.1 10*3/uL (ref 0.0–0.5)
Eosinophils Relative: 1 %
HEMATOCRIT: 45.4 % (ref 39.0–52.0)
HEMOGLOBIN: 15.1 g/dL (ref 13.0–17.0)
IMMATURE GRANULOCYTES: 0 %
LYMPHS ABS: 1.1 10*3/uL (ref 0.7–4.0)
LYMPHS PCT: 19 %
MCH: 30.3 pg (ref 26.0–34.0)
MCHC: 33.3 g/dL (ref 30.0–36.0)
MCV: 91 fL (ref 80.0–100.0)
Monocytes Absolute: 0.4 10*3/uL (ref 0.1–1.0)
Monocytes Relative: 7 %
NEUTROS ABS: 3.9 10*3/uL (ref 1.7–7.7)
NEUTROS PCT: 72 %
NRBC: 0 % (ref 0.0–0.2)
Platelets: 275 10*3/uL (ref 150–400)
RBC: 4.99 MIL/uL (ref 4.22–5.81)
RDW: 14.2 % (ref 11.5–15.5)
WBC: 5.4 10*3/uL (ref 4.0–10.5)

## 2018-06-15 LAB — PHENYTOIN LEVEL, TOTAL: Phenytoin Lvl: 2.9 ug/mL — ABNORMAL LOW (ref 10.0–20.0)

## 2018-06-15 MED ORDER — PHENYTOIN 50 MG PO CHEW
100.0000 mg | CHEWABLE_TABLET | Freq: Once | ORAL | Status: AC
  Administered 2018-06-15: 100 mg via ORAL
  Filled 2018-06-15: qty 2

## 2018-06-15 NOTE — Progress Notes (Signed)
LCSW met with patient and his sister. He is a long term resident at [facility] in [place] and once medically cleared he can return to his group home.  Patient reports he is now feeling fine but when he was at church as per family he had a seizure. Patient is alert and pleasant and reports the last seizure he had was a year ago.  No further SW needs at this time  [employer] LCSW [phone]

## 2018-06-15 NOTE — ED Provider Notes (Signed)
[facility] Emergency Department Provider Note ____________________________________________   First MD Initiated Contact with Patient 06/15/18 1148     (approximate)  I have reviewed the triage vital signs and the nursing notes.   HISTORY  Chief Complaint Seizures  Level 5 caveat: History of present illness limited due to cognitive deficit  HPI Jeremiah Nguyen is a 63 y.o. male with PMH as noted below who presents with an apparent seizure-like episode.  Per his sister, the patient was at church and began having an episode in which she was not responding, was shaking, and looked as if he was moving his arms to scratch himself.  She states that it lasted for possibly as long as 20 minutes.  Afterwards the patient seems somewhat confused but now has returned to his baseline.  She states that this episode was somewhat atypical for his seizures, but he does have a known seizure disorder.  The patient denies any symptoms currently.  Past Medical History:  Diagnosis Date  . COPD (chronic obstructive pulmonary disease) ([place])   . Eating disorder   . GERD (gastroesophageal reflux disease)   . Liver disease   . Seizures ([place])   . Sleep apnea     Patient Active Problem List   Diagnosis Date Noted  . Person living in residential institution 06/02/2018  . Eating disorder   . Liver disease, chronic 01/07/2018  . Seizure disorder ([place]) 06/10/2017  . Gastroesophageal reflux disease 06/10/2017  . Sepsis ([place]) 11/25/2014  . Tobacco abuse 11/25/2014    Past Surgical History:  Procedure Laterality Date  . ESOPHAGOGASTRODUODENOSCOPY (EGD) WITH PROPOFOL N/A 02/25/2018   Procedure: ESOPHAGOGASTRODUODENOSCOPY (EGD) WITH PROPOFOL;  Surgeon: [name], MD;  Location: [facility] ENDOSCOPY;  Service: Gastroenterology;  Laterality: N/A;  . LESION EXCISION Left 11-11-74   follicular cyst  . SKIN BIOPSY      Prior to Admission medications   Medication Sig Start Date End Date  Taking? Authorizing Provider  buPROPion (WELLBUTRIN XL) 150 MG 24 hr tablet TAKE 1 TABLET BY MOUTH DAILY (DEPRESSION) * DO NOT CRUSH * 05/09/18  Yes Mikey College, NP  chlorproMAZINE (THORAZINE) 50 MG tablet Take 1 tablet (50 mg total) by mouth 4 (four) times daily as needed. 03/06/18  Yes Mikey College, NP  omeprazole (PRILOSEC) 40 MG capsule Take 1 capsule (40 mg total) by mouth daily. 02/11/18  Yes [name], MD  PHENObarbital (LUMINAL) 60 MG tablet TAKE 1 TABLET BY MOUTH AT BEDTIME 03/06/18  Yes Mikey College, NP  phenytoin (DILANTIN) 100 MG ER capsule Take 2 capsules (200 mg total) by mouth 2 (two) times daily. 03/06/18  Yes Mikey College, NP  Potassium Chloride ER 20 MEQ TBCR Take 20 mEq by mouth daily. 01/07/18  Yes Mikey College, NP  sennosides-docusate sodium (SENOKOT-S) 8.6-50 MG tablet Take 1 tablet by mouth 2 (two) times daily. 07/17/17  Yes Mikey College, NP  traZODone (DESYREL) 100 MG tablet Take 1 tablet (100 mg total) by mouth at bedtime. 03/06/18  Yes Mikey College, NP  baclofen (LIORESAL) 10 MG tablet TAKE ONE TABLET FOUR TIMES DAILY AS NEEDED FOR HICCUPS. 05/09/18   Mikey College, NP    Allergies Patient has no known allergies.  Family History  Problem Relation Age of Onset  . Cancer Mother        breast    Social History Social History   Tobacco Use  . Smoking status: Current Every Day Smoker  Packs/day: 1.00    Years: 30.00    Pack years: 30.00  . Smokeless tobacco: Never Used  Substance Use Topics  . Alcohol use: No  . Drug use: No    Review of Systems Level 5 caveat: Review of systems limited due to cognitive deficit Constitutional: No fever. Cardiovascular: Denies chest pain. Gastrointestinal: No vomiting. Musculoskeletal: Negative for back pain. Skin: Negative for rash. Neurological: Negative for headache.   ____________________________________________   PHYSICAL EXAM:  VITAL  SIGNS: ED Triage Vitals  Enc Vitals Group     BP 06/15/18 1140 140/79     Pulse --      Resp 06/15/18 1140 18     Temp 06/15/18 1140 98.2 F (36.8 C)     Temp src --      SpO2 06/15/18 1140 97 %     Weight 06/15/18 1138 163 lb (73.9 kg)     Height 06/15/18 1138 5\' 8"  (1.727 m)     Head Circumference --      Peak Flow --      Pain Score 06/15/18 1138 0     Pain Loc --      Pain Edu? --      Excl. in [place]? --     Constitutional: Alert. Well appearing and in no acute distress. Eyes: Conjunctivae are normal.  EOMI.  PERRLA. Head: Atraumatic. Nose: No congestion/rhinnorhea. Mouth/Throat: Mucous membranes are moist.   Neck: Normal range of motion.  Cardiovascular:  Good peripheral circulation. Respiratory: Normal respiratory effort.  Gastrointestinal: No distention.  Musculoskeletal:  Extremities warm and well perfused.  Neurologic:  Normal speech and language.  Motor intact in all extremities.  Normal coordination.  No gross focal neurologic deficits are appreciated.  Skin:  Skin is warm and dry. No rash noted. Psychiatric: Calm and cooperative.  ____________________________________________   LABS (all labs ordered are listed, but only abnormal results are displayed)  Labs Reviewed  BASIC METABOLIC PANEL - Abnormal; Notable for the following components:      Result Value   BUN 7 (*)    All other components within normal limits  PHENYTOIN LEVEL, TOTAL - Abnormal; Notable for the following components:   Phenytoin Lvl 2.9 (*)    All other components within normal limits  CBC WITH DIFFERENTIAL/PLATELET   ____________________________________________  EKG  ED ECG REPORT I, Arta Silence, the attending physician, personally viewed and interpreted this ECG.  Date: 06/15/2018 EKG Time: 1139 Rate: 70 Rhythm: normal sinus rhythm QRS Axis: normal Intervals: normal ST/T Wave abnormalities: normal Narrative Interpretation: no evidence of acute  ischemia  ____________________________________________  RADIOLOGY    ____________________________________________   PROCEDURES  Procedure(s) performed: No  Procedures  Critical Care performed: No ____________________________________________   INITIAL IMPRESSION / ASSESSMENT AND PLAN / ED COURSE  Pertinent labs & imaging results that were available during my care of the patient were reviewed by me and considered in my medical decision making (see chart for details).  63 year old male with PMH as noted above including known seizure disorder presents after an apparent seizure-like episode while at church this afternoon.  He was confused afterwards but is now at his baseline mental status per his sister.  She reports that the episode was somewhat atypical for him.  On exam in the ED, the patient is comfortable appearing.  His vital signs are normal.  He is alert and responding appropriately.  His neuro exam is nonfocal and he has no trauma.  Although somewhat atypical for him, the episode  is overall most consistent with a seizure.  We will obtain labs including Dilantin level and reassess.  If the patient has no further episodes and the lab work-up is unremarkable anticipate discharge home.  ----------------------------------------- 2:36 PM on 06/15/2018 -----------------------------------------  Lab work-up is unremarkable except for low Dilantin level.  The patient states that he did take his morning medications today.  I will give an additional dose of Dilantin in the ED to help increase the serum concentration, and I instructed the patient to follow-up with his regular doctor for a recheck and possible dose adjustment if needed.  The patient and his sister agree with this plan.  Return precautions given, and they expressed understanding. ____________________________________________   FINAL CLINICAL IMPRESSION(S) / ED DIAGNOSES  Final diagnoses:  Seizure disorder ([place])       NEW MEDICATIONS STARTED DURING THIS VISIT:  New Prescriptions   No medications on file     Note:  This document was prepared using Dragon voice recognition software and may include unintentional dictation errors.    Arta Silence, MD 06/15/18 [phone]

## 2018-06-15 NOTE — ED Triage Notes (Signed)
PT to ER via EMS from church.  Pt lives at a group home in [place] and was at church this morning when he has an "episode" reported as not acting right, not responsive to those around him and shaking.  Pt has a hx of seizures.  Pt alert and oriented at this time, pt is at probable baseline.

## 2018-06-15 NOTE — Discharge Instructions (Addendum)
The Dilantin level was somewhat low today, at 2.9.  An additional dose of Dilantin was given to increase the level.  The Dilantin level should be rechecked soon, and the dose adjusted accordingly.  Return to the ER for any new or recurrent seizure-like episodes or any other symptoms that concern you.

## 2018-06-15 NOTE — ED Notes (Signed)
Failed iv access of the left FA.

## 2018-06-18 ENCOUNTER — Telehealth: Payer: Self-pay | Admitting: Nurse Practitioner

## 2018-06-18 DIAGNOSIS — G40909 Epilepsy, unspecified, not intractable, without status epilepticus: Secondary | ICD-10-CM

## 2018-06-18 NOTE — Telephone Encounter (Signed)
Ms. Junious Dresser called requesting a refill for  phenobarbital 600 mg

## 2018-06-19 ENCOUNTER — Telehealth: Payer: Self-pay | Admitting: Family Medicine

## 2018-06-19 MED ORDER — PHENOBARBITAL 60 MG PO TABS: 60.0000 mg | ORAL_TABLET | Freq: Every day | ORAL | 0 refills | Status: DC

## 2018-06-19 NOTE — Telephone Encounter (Signed)
error 

## 2018-06-25 ENCOUNTER — Ambulatory Visit: Payer: Medicaid Other | Admitting: Gastroenterology

## 2018-06-30 ENCOUNTER — Ambulatory Visit (INDEPENDENT_AMBULATORY_CARE_PROVIDER_SITE_OTHER): Payer: Medicaid Other | Admitting: Psychiatry

## 2018-06-30 ENCOUNTER — Other Ambulatory Visit: Payer: Self-pay | Admitting: Nurse Practitioner

## 2018-06-30 ENCOUNTER — Encounter: Payer: Self-pay | Admitting: Psychiatry

## 2018-06-30 ENCOUNTER — Other Ambulatory Visit: Payer: Self-pay

## 2018-06-30 VITALS — BP 109/67 | HR 97 | Temp 98.7°F | Wt 131.8 lb

## 2018-06-30 DIAGNOSIS — F509 Eating disorder, unspecified: Secondary | ICD-10-CM

## 2018-06-30 DIAGNOSIS — G4700 Insomnia, unspecified: Secondary | ICD-10-CM

## 2018-06-30 DIAGNOSIS — F419 Anxiety disorder, unspecified: Secondary | ICD-10-CM | POA: Diagnosis not present

## 2018-06-30 DIAGNOSIS — F70 Mild intellectual disabilities: Secondary | ICD-10-CM

## 2018-06-30 DIAGNOSIS — F1021 Alcohol dependence, in remission: Secondary | ICD-10-CM

## 2018-06-30 DIAGNOSIS — G40909 Epilepsy, unspecified, not intractable, without status epilepticus: Secondary | ICD-10-CM

## 2018-06-30 DIAGNOSIS — F17200 Nicotine dependence, unspecified, uncomplicated: Secondary | ICD-10-CM

## 2018-06-30 DIAGNOSIS — Z79899 Other long term (current) drug therapy: Secondary | ICD-10-CM

## 2018-06-30 MED ORDER — BUPROPION HCL 75 MG PO TABS: 75.0000 mg | ORAL_TABLET | Freq: Every day | ORAL | 1 refills | Status: DC

## 2018-06-30 MED ORDER — HYDROXYZINE PAMOATE 25 MG PO CAPS: 25.0000 mg | ORAL_CAPSULE | Freq: Every evening | ORAL | 1 refills | Status: DC | PRN

## 2018-06-30 MED ORDER — SERTRALINE HCL 25 MG PO TABS: 25.0000 mg | ORAL_TABLET | Freq: Every day | ORAL | 1 refills | Status: DC

## 2018-06-30 NOTE — Progress Notes (Signed)
Psychiatric Initial Adult Assessment   Patient Identification: Jeremiah Nguyen MRN:  [id number] Date of Evaluation:  06/30/2018 Referral Source: Cassell Smiles NP Chief Complaint:  ' I am here to establish care." Chief Complaint    Establish Care; Weight Loss; Insomnia     Visit Diagnosis:    ICD-10-CM   1. Insomnia, unspecified type G47.00 hydrOXYzine (VISTARIL) 25 MG capsule  2. Anxiety disorder, unspecified type F41.9 buPROPion (WELLBUTRIN) 75 MG tablet    sertraline (ZOLOFT) 25 MG tablet    hydrOXYzine (VISTARIL) 25 MG capsule  3. Eating disorder, unspecified type F50.9 sertraline (ZOLOFT) 25 MG tablet  4. Mild intellectual disability F70   5. High risk medication use Z79.899    dilantin  6. Alcohol use disorder, severe, in sustained remission ([place]) F10.21   7. Tobacco use disorder F17.200     History of Present Illness:  Jeremiah Nguyen is a 63 year old African-American male, single, lives at family care home in [place] , has a history of anxiety, insomnia, unspecified eating disorder, intellectual disability likely mild, seizure disorder, hiccups, presented to the clinic today to establish care.  Patient presented along with his family care group home manager/owner-Jeremiah Nguyen ( who is also his cousin)   Patient appeared to be limited historian and majority of information was obtained from his group home owner as well as from medical records in epic.  Patient was referred to clinic for evaluation of his eating disorder .  Per Jeremiah Nguyen patient has been struggling with this for a very long time.  He has difficulty chewing on food because he has no teeth.  Patient chews on food in peculiar way and seems to have trouble swallowing them.  He tries to swallow food and most of the time it gets stuck in his throat.  He has been complaining of that for a very long time.  Patient starts having these hiccups because he swallows food without chewing it properly.  He either throws up food then and there as  soon as he swallows them or he has hiccups which develops later which helps him to throw it out later.  This has been going on for a very long time.  Patient hence has been losing weight.    Patient currently sees a gastroenterologist Dr. Vicente Males who is evaluating him for gastric problems.  I have reviewed medical records in E [facility] dated 03/26/2018-Per Dr. Vicente Males.  At that time the plan was to get H. pylori breath test, start PPI, CT scan of chest abdomen pelvis.  MRI brain for hiccups and balance issues.  Check all autoimmune labs as well as psychiatric evaluation referral.  Patient today reports he throws up because of the hiccups.  He denies any significant depressive symptoms.  He is not overtly preoccupied with his body image.  However per staff-patient does report often that he does not want to gain a lot of weight.  Patient however did not report this to Probation officer today.  He does seem to be anxious however unable to elaborate on his anxiety.  He denies any depressive symptoms like sadness.  He does have sleep problems.  He is on trazodone for the same.  However per staff patient does not sleep well even though he is on sleep medication.  He takes a lot of to tootsie rolls throughout the day , and it is likely that could also be contributing to his sleep problems at night.  Patient does have a history of seizures.  His seizures are atypical where  as per staff when he has a seizure he gets up and has certain mannerisms which can last for a few seconds to minutes and then he is out of it.  He does not have any passing out episodes or loss of bowel or bladder, tongue biting and so on.  The last time he had a seizure was 2 weeks ago.  Patient at that time was at church and hence was taken to the emergency department where he was evaluated.  Patient usually gets his seizures 1-2 times per month.  He is on Dilantin for the same.  Patient with intellectual disability likely mild.  Patient spends his day watching sports.   He has been living at the family care home owned by his cousin since the past 18 years or more.  Patient is his own legal guardian.  He does take care of his ADLs, feeds himself however needs help with medications.  Patient denies any suicidality, homicidality or perceptual disturbances.  Patient does have a history of alcohol abuse.  Patient reports he used to drink heavily 10-12 drinks per day from the age of 63 to the age of 30 or more.  He stopped drinking around early 2000.  Per staff patient had severe problem with drinking which affected his level of functioning and he would spend his day wandering the streets, impulsively going into traffic and so on previously.  Ever since he stopped drinking he has been doing better.       Associated Signs/Symptoms: Depression Symptoms:  anxiety, disturbed sleep, decreased appetite, (Hypo) Manic Symptoms:  denies Anxiety Symptoms:  anxiety unspecified Psychotic Symptoms:  denies PTSD Symptoms: Negative  Past Psychiatric History: [facility] due to his alcoholism, court appointed several years ago.  Patient is currently on medications like Wellbutrin, trazodone- prescribed by his previous primary medical doctor however patient or staff who presented with him did not know why the Wellbutrin was added.  Previous Psychotropic Medications: Yes - Remeron, wellbutrin,trazodone  Substance Abuse History in the last 12 months:  No.  Consequences of Substance Abuse: Negative  Past Medical History:  Past Medical History:  Diagnosis Date  . COPD (chronic obstructive pulmonary disease) ([place])   . Eating disorder   . GERD (gastroesophageal reflux disease)   . Liver disease   . Seizures ([place])   . Sleep apnea     Past Surgical History:  Procedure Laterality Date  . ESOPHAGOGASTRODUODENOSCOPY (EGD) WITH PROPOFOL N/A 02/25/2018   Procedure: ESOPHAGOGASTRODUODENOSCOPY (EGD) WITH PROPOFOL;  Surgeon: [name],  MD;  Location: [facility] ENDOSCOPY;  Service: Gastroenterology;  Laterality: N/A;  . LESION EXCISION Left 4-58-09   follicular cyst  . SKIN BIOPSY      Family Psychiatric History: Denies.  Family History:  Family History  Problem Relation Age of Onset  . Cancer Mother        breast    Social History:   Social History   Socioeconomic History  . Marital status: Single    Spouse name: Not on file  . Number of children: 0  . Years of education: Not on file  . Highest education level: Not on file  Occupational History  . Not on file  Social Needs  . Financial resource strain: Not hard at all  . Food insecurity:    Worry: Never true    Inability: Never true  . Transportation needs:    Medical: No    Non-medical: No  Tobacco Use  .  Smoking status: Current Every Day Smoker    Packs/day: 1.00    Years: 30.00    Pack years: 30.00  . Smokeless tobacco: Never Used  Substance and Sexual Activity  . Alcohol use: No  . Drug use: No  . Sexual activity: Not Currently  Lifestyle  . Physical activity:    Days per week: 0 days    Minutes per session: 0 min  . Stress: Very much  Relationships  . Social connections:    Talks on phone: Not on file    Gets together: Not on file    Attends religious service: Not on file    Active member of club or organization: Not on file    Attends meetings of clubs or organizations: Not on file    Relationship status: Never married  Other Topics Concern  . Not on file  Social History Narrative  . Not on file    Additional Social History: Pt is single.  He lives at her family care home.  He is intellectually disabled.  He has 11th grade education and went to special classes.  Patient has several siblings and keeps in touch with them.  Patient has been living in the family care home since the past 16 years or more.  He is his own guardian. Allergies:  No Known Allergies  Metabolic Disorder Labs: Lab Results  Component Value Date   HGBA1C 5.2  01/03/2018   MPG 103 01/03/2018   No results found for: PROLACTIN No results found for: CHOL, TRIG, HDL, CHOLHDL, VLDL, LDLCALC Lab Results  Component Value Date   TSH 0.40 01/03/2018    Therapeutic Level Labs: No results found for: LITHIUM No results found for: CBMZ No results found for: VALPROATE  Current Medications: Current Outpatient Medications  Medication Sig Dispense Refill  . baclofen (LIORESAL) 10 MG tablet TAKE ONE TABLET FOUR TIMES DAILY AS NEEDED FOR HICCUPS. 120 tablet 10  . chlorproMAZINE (THORAZINE) 50 MG tablet Take 1 tablet (50 mg total) by mouth 4 (four) times daily as needed. 120 tablet 5  . omeprazole (PRILOSEC) 40 MG capsule Take 1 capsule (40 mg total) by mouth daily. 90 capsule 3  . PHENObarbital (LUMINAL) 60 MG tablet Take 1 tablet (60 mg total) by mouth at bedtime. 30 tablet 0  . phenytoin (DILANTIN) 100 MG ER capsule Take 2 capsules (200 mg total) by mouth 2 (two) times daily. 120 capsule 5  . Potassium Chloride ER 20 MEQ TBCR Take 20 mEq by mouth daily. 30 tablet 2  . sennosides-docusate sodium (SENOKOT-S) 8.6-50 MG tablet Take 1 tablet by mouth 2 (two) times daily. 60 tablet 5  . traZODone (DESYREL) 100 MG tablet Take 1 tablet (100 mg total) by mouth at bedtime. 30 tablet 5  . buPROPion (WELLBUTRIN) 75 MG tablet Take 1 tablet (75 mg total) by mouth daily. 30 tablet 1  . hydrOXYzine (VISTARIL) 25 MG capsule Take 1 capsule (25 mg total) by mouth at bedtime as needed. For sleep 30 capsule 1  . sertraline (ZOLOFT) 25 MG tablet Take 1 tablet (25 mg total) by mouth daily. 30 tablet 1   No current facility-administered medications for this visit.     Musculoskeletal: Strength & Muscle Tone: within normal limits Gait & Station: normal Patient leans: N/A  Psychiatric Specialty Exam: Review of Systems  Psychiatric/Behavioral: The patient is nervous/anxious.   All other systems reviewed and are negative.   Blood pressure 109/67, pulse 97, temperature 98.7  F (37.1 C), temperature source  Oral, weight 131 lb 12.8 oz (59.8 kg).Body mass index is 20.04 kg/m.  General Appearance: Casual  Eye Contact:  Fair  Speech:  Normal Rate  Volume:  Normal  Mood:  Anxious  Affect:  Congruent  Thought Process:  Goal Directed and Descriptions of Associations: Intact  Orientation:  Full (Time, Place, and Person)  Thought Content:  Logical  Suicidal Thoughts:  No  Homicidal Thoughts:  No  Memory:  Immediate;   Fair Recent;   Fair Remote;   at baseline  Judgement:  Other:  shallow  Insight:  Shallow  Psychomotor Activity:  Normal  Concentration:  Concentration: Fair and Attention Span: Fair  Recall:  [employer] of Knowledge:Fair  Language: Fair  Akathisia:  No  Handed:  Right  AIMS (if indicated): denies tremors, rigidity,stiffness  Assets:  Communication Skills Desire for Improvement Social Support  ADL's:  Intact  Cognition: WNL  Sleep:  Poor   Screenings: PHQ2-9     Office Visit from 08/22/2017 in [facility] Office Visit from 05/24/2017 in [place]  PHQ-2 Total Score  0  0  PHQ-9 Total Score  0  8      Assessment and Plan: Kaeson is a 63 yr old African-American male who is single, on disability, lives at family care home in [place], has a history of seizure disorder, chronic hiccups, eating disorder, insomnia, presented to the clinic today to establish care.  Patient is biologically predisposed given his intellectual disability, history of substance abuse although currently in remission.  Patient is a limited historian and hence unable to provide information about his eating disorder problems.  However based on collateral information patient likely preoccupied with weight gain which may be one of the factors.  It is also likely that he does have hiccups which may be contributing to his vomiting.  She will continue to work with his other providers for his hiccups and other problems.  Patient will benefit from  psychotropic medications as well as psychotherapy.  He will start psychotherapy sessions with therapist here in clinic.  Plan as noted below.  Plan For insomnia Continue trazodone 100 mg p.o. nightly Add hydroxyzine 25 mg p.o. nightly as needed  For eating disorder- will need to explore more. Zoloft 25 mg p.o. daily. Reduce Wellbutrin to 75 mg PO daily. Refer for psychotherapy with therapist here in clinic.  For anxiety disorder Zoloft will help.  For history of alcohol use in remission We will continue to monitor closely.  For Tobacco use disorder Patient not ready to quit.  For high risk medication-patient is on Dilantin.  Dilantin can have interaction with his psychotropic medications including Zoloft.  Zoloft can increase Dilantin levels in his system leading to Dilantin toxicity.  This was discussed at length with patient as well as staff-Jeremiah Nguyen. Will order Dilantin level.  He will go to his primary care provider Ms. Cassell Smiles, NP to get his Dilantin levels done in a week from now.  This was communicated with his primary medical doctor who agrees with plan.  Reviewed medical records in [facility] per Dr. Anna-gastroenterologist dated 03/26/2018 as summarized above.  I have reviewed medical records in [facility] per Cassell Smiles, NP-dated 05/30/2018.  Patient at that visit was seen for high risk medication use-advised levels of phenobarbital and Dilantin to be drawn.'  Collateral information was obtained from family care home staff-Jeremiah Nguyen as summarized above.  Follow-up in clinic in 2 weeks or sooner if needed.  I have spent atleast 60 minutes  face to face with patient today. More than 50 % of the time was spent for psychoeducation and supportive psychotherapy and care coordination.  This note was generated in part or whole with voice recognition software. Voice recognition is usually quite accurate but there are transcription errors that can and very often do occur. I apologize for  any typographical errors that were not detected and corrected.        Ursula Alert, MD 2/10/20205:15 PM

## 2018-06-30 NOTE — Patient Instructions (Signed)

## 2018-07-01 ENCOUNTER — Telehealth: Payer: Self-pay | Admitting: Psychiatry

## 2018-07-01 DIAGNOSIS — F419 Anxiety disorder, unspecified: Secondary | ICD-10-CM

## 2018-07-01 MED ORDER — BUPROPION HCL 75 MG PO TABS: 75.0000 mg | ORAL_TABLET | Freq: Every day | ORAL | 1 refills | Status: DC

## 2018-07-01 NOTE — Telephone Encounter (Signed)
Sent script clarififcation with new script for wellbutrin 75 mg to [place]

## 2018-07-07 ENCOUNTER — Other Ambulatory Visit: Payer: Medicaid Other

## 2018-07-07 ENCOUNTER — Other Ambulatory Visit: Payer: Self-pay | Admitting: Nurse Practitioner

## 2018-07-07 DIAGNOSIS — F509 Eating disorder, unspecified: Secondary | ICD-10-CM

## 2018-07-07 DIAGNOSIS — G40909 Epilepsy, unspecified, not intractable, without status epilepticus: Secondary | ICD-10-CM

## 2018-07-07 DIAGNOSIS — Z79899 Other long term (current) drug therapy: Secondary | ICD-10-CM

## 2018-07-08 ENCOUNTER — Other Ambulatory Visit: Payer: Self-pay | Admitting: Nurse Practitioner

## 2018-07-08 DIAGNOSIS — G40909 Epilepsy, unspecified, not intractable, without status epilepticus: Secondary | ICD-10-CM

## 2018-07-09 ENCOUNTER — Other Ambulatory Visit: Payer: Self-pay

## 2018-07-09 MED ORDER — SENNA-DOCUSATE SODIUM 8.6-50 MG PO TABS: 1.0000 | ORAL_TABLET | Freq: Two times a day (BID) | ORAL | 5 refills | Status: DC

## 2018-07-10 LAB — TEST AUTHORIZATION

## 2018-07-10 LAB — PHENYTOIN LEVEL, TOTAL: Phenytoin, Total: 8.1 mg/L — ABNORMAL LOW (ref 10.0–20.0)

## 2018-07-10 LAB — PHENOBARBITAL LEVEL: Phenobarbital: 22.1 mg/L (ref 15.0–40.0)

## 2018-07-14 ENCOUNTER — Ambulatory Visit: Payer: Medicaid Other | Admitting: Psychiatry

## 2018-07-17 ENCOUNTER — Ambulatory Visit: Payer: Medicaid Other | Admitting: Licensed Clinical Social Worker

## 2018-07-17 ENCOUNTER — Other Ambulatory Visit: Payer: Self-pay

## 2018-07-17 DIAGNOSIS — G40909 Epilepsy, unspecified, not intractable, without status epilepticus: Secondary | ICD-10-CM

## 2018-07-17 MED ORDER — PHENOBARBITAL 60 MG PO TABS: 60.0000 mg | ORAL_TABLET | Freq: Every day | ORAL | 2 refills | Status: DC

## 2018-07-17 NOTE — Telephone Encounter (Signed)
Jeremiah Nguyen called requesting a refill on patients Phenobarbital to [place].

## 2018-07-22 ENCOUNTER — Encounter: Payer: Self-pay | Admitting: Gastroenterology

## 2018-07-22 ENCOUNTER — Ambulatory Visit: Payer: Medicaid Other | Admitting: Gastroenterology

## 2018-07-22 VITALS — BP 84/46 | HR 69 | Ht 69.0 in | Wt 131.4 lb

## 2018-07-22 DIAGNOSIS — B1910 Unspecified viral hepatitis B without hepatic coma: Secondary | ICD-10-CM | POA: Diagnosis not present

## 2018-07-22 DIAGNOSIS — R634 Abnormal weight loss: Secondary | ICD-10-CM

## 2018-07-22 NOTE — Progress Notes (Signed)
Jeremiah Bellows MD, MRCP(U.K) [address]  [place]  [place], [place] [address]  Main: [phone]  Fax: [phone]   Primary Care Physician: Mikey College, NP  Primary Gastroenterologist:  Dr. Jonathon Nguyen   No chief complaint on file.   HPI: Jeremiah Nguyen is a 63 y.o. male   Summary of history :  He was initially referred and seen on 02/11/2018 for weight loss 30 lbs since jan 2019  and hiccups.He has hiccups, on and off, occurs every day , each episode lasts till he throws up or goes to bed. . This has been ongoing for many years.  Labs 01/03/18: Alk phos elevated at 152, HIV ,Hep C ab negative. CBC,TSH-normal .HBa1c normal   Sister who says that he induces vomiting when he thinks he gains weight., he was actually showing me the steps he performs to induce vomiting.  02/12/2018 alkaline phosphatase 130 isoenzymes are all within normal limits.  Gamma GT 81, PTH normal  performed an upper endoscopy on 02/25/2018.  No abnormality was seen on upper endoscopy. He cannot do the colonoscopy because he could not do the prep.  CT scan of the chest abdomen pelvis was ordered but has not been performed yet as it was declined by his insurance     Interval history  03/26/2018-07/22/2018   At his last visit we had ordered labs to evaluate his elevated alkaline phosphatase none of them have been obtained.  03/2018 : MRI brain shows white matter disease -chronic microvascular ischemia.  H pylori breath test negative. Hep B e antigen was positive. Iron % saturation 87%. Hb 14.1 grams , Ferritin 49 , Hep A ab positive. Hep bsag,ceruloplasmin, factin,AMA  - negative.   Seen by Psychiatry in 06/2018 : Plan to refer to [facility].  Lost 8 lb sincelast visit   Still throws up , has hiccups. Here with the caregiver. On baclofen and thorazine. Says it helps him    Current Outpatient Medications  Medication Sig Dispense Refill  . baclofen (LIORESAL) 10 MG tablet TAKE ONE  TABLET FOUR TIMES DAILY AS NEEDED FOR HICCUPS. 120 tablet 10  . buPROPion (WELLBUTRIN) 75 MG tablet Take 1 tablet (75 mg total) by mouth daily. 30 tablet 1  . chlorproMAZINE (THORAZINE) 50 MG tablet Take 1 tablet (50 mg total) by mouth 4 (four) times daily as needed. 120 tablet 5  . hydrOXYzine (VISTARIL) 25 MG capsule Take 1 capsule (25 mg total) by mouth at bedtime as needed. For sleep 30 capsule 1  . omeprazole (PRILOSEC) 40 MG capsule Take 1 capsule (40 mg total) by mouth daily. 90 capsule 3  . PHENObarbital (LUMINAL) 60 MG tablet Take 1 tablet (60 mg total) by mouth at bedtime. 30 tablet 2  . phenytoin (DILANTIN) 100 MG ER capsule Take 2 capsules (200 mg total) by mouth 2 (two) times daily. 120 capsule 5  . Potassium Chloride ER 20 MEQ TBCR Take 20 mEq by mouth daily. 30 tablet 2  . sennosides-docusate sodium (SENOKOT-S) 8.6-50 MG tablet Take 1 tablet by mouth 2 (two) times daily. 60 tablet 5  . sertraline (ZOLOFT) 25 MG tablet Take 1 tablet (25 mg total) by mouth daily. 30 tablet 1  . traZODone (DESYREL) 100 MG tablet Take 1 tablet (100 mg total) by mouth at bedtime. 30 tablet 5   No current facility-administered medications for this visit.     Allergies as of 07/22/2018  . (No Known Allergies)    ROS:  General: Negative  for anorexia, weight loss, fever, chills, fatigue, weakness. ENT: Negative for hoarseness, difficulty swallowing , nasal congestion. CV: Negative for chest pain, angina, palpitations, dyspnea on exertion, peripheral edema.  Respiratory: Negative for dyspnea at rest, dyspnea on exertion, cough, sputum, wheezing.  GI: See history of present illness. GU:  Negative for dysuria, hematuria, urinary incontinence, urinary frequency, nocturnal urination.  Endo: Negative for unusual weight change.    Physical Examination:   There were no vitals taken for this visit.  General: appears very thin .  Eyes: No icterus. Conjunctivae pink. Mouth: Oropharyngeal mucosa moist  and pink , no lesions erythema or exudate. Lungs: Clear to auscultation bilaterally. Non-labored. Heart: Regular rate and rhythm, no murmurs rubs or gallops.  Abdomen: Bowel sounds are normal, nontender, nondistended, no hepatosplenomegaly or masses, no abdominal bruits or hernia , no rebound or guarding.   Extremities: No lower extremity edema. No clubbing or deformities. Neuro: Alert and oriented x 1.  Grossly intact. Skin: Warm and dry, no jaundice.   Psych: Alert but flat affect   BP (!) 84/46   Pulse 69   Ht '5\' 9"'  (1.753 m)   Wt 131 lb 6.4 oz (59.6 kg)   BMI 19.40 kg/m    Imaging Studies: No results found.  Assessment and Plan:   Jeremiah Nguyen is a 63 y.o. y/o male  here to follow up forhiccups,  and unintentional weight loss. Being followed by a psychiatrist for self induced vomiting . Hep B e antigen positive with surface antigen negative. With a history of possible Hep B in the past , unintentional weight loss and malignancy it is very important we perform a CT scan of the abdomen due to ongoing weight loss(8 lbs since last visit), he is also not able to go through a colonoscopy due to inability to perform prep .    Plan  1. Hepatitis B viral load, delta antigen . LFT's are normal presently.  2. PPI 3. weight loss  : CT scan of the chest/abdomen and pelvis.Will appeal to insurance to authorise.     Dr Jeremiah Bellows  MD,MRCP [facility]) Follow up in 3 months

## 2018-07-23 ENCOUNTER — Telehealth: Payer: Self-pay | Admitting: Nurse Practitioner

## 2018-07-23 DIAGNOSIS — R066 Hiccough: Secondary | ICD-10-CM

## 2018-07-23 MED ORDER — CHLORPROMAZINE HCL 50 MG PO TABS: 50.0000 mg | ORAL_TABLET | Freq: Four times a day (QID) | ORAL | 5 refills | Status: DC

## 2018-07-23 MED ORDER — BACLOFEN 10 MG PO TABS: 10.0000 mg | ORAL_TABLET | Freq: Four times a day (QID) | ORAL | 5 refills | Status: DC

## 2018-07-23 NOTE — Telephone Encounter (Signed)
Incoming call

## 2018-07-23 NOTE — Telephone Encounter (Signed)
Please print new med list.  I have changed the orders.

## 2018-07-23 NOTE — Telephone Encounter (Signed)
Jeremiah Nguyen asked to have baclofen and thorazine changed for PRN back to 4 times a day.  She would like to a paper stating this for facility and pharmacy.  Her call back number is [phone]

## 2018-07-30 LAB — TSH: TSH: 0.298 u[IU]/mL — AB (ref 0.450–4.500)

## 2018-07-30 LAB — HEPATITIS D ANTIGEN: Hepatitis D Antigen: NEGATIVE

## 2018-07-30 LAB — HEPATITIS B DNA, ULTRAQUANTITATIVE, PCR: HBV DNA SERPL PCR-ACNC: NOT DETECTED IU/mL

## 2018-07-30 LAB — HEPATITIS D QRT-PCR (PLASMA): HDV qRT-PCR (plasma): NOT DETECTED

## 2018-08-05 ENCOUNTER — Other Ambulatory Visit: Payer: Self-pay | Admitting: Nurse Practitioner

## 2018-08-05 DIAGNOSIS — G40909 Epilepsy, unspecified, not intractable, without status epilepticus: Secondary | ICD-10-CM

## 2018-08-07 ENCOUNTER — Ambulatory Visit: Admission: RE | Admit: 2018-08-07 | Payer: Medicaid Other | Source: Ambulatory Visit

## 2018-08-12 ENCOUNTER — Telehealth: Payer: Self-pay | Admitting: Gastroenterology

## 2018-08-12 NOTE — Telephone Encounter (Signed)
FYI...  Left vm for Melissa at [facility] regarding insurance denial for CT scan. Dr. Vicente Males has been made aware. Waiting on is next step.

## 2018-08-12 NOTE — Telephone Encounter (Signed)
Called pt caretaker, Junious Dresser, to inform her that pt insurance has denied coverage of the CT scan.  Unable to contact, VM is full  Also called pt sister Unable to contact.

## 2018-08-12 NOTE — Telephone Encounter (Signed)
Melissa calling from the [facility] about patient's CT scheduled for 08-18-2018. The authorization to have was denied. Is he still having on 08-18-2018 and if so he will need authorization. Please call her and let her know either way.

## 2018-08-13 NOTE — Telephone Encounter (Signed)
Jeremiah Nguyen  Is calling  For Dr. Georgeann Oppenheim nurse regarding the CT scan please call pt cb [phone]

## 2018-08-13 NOTE — Telephone Encounter (Signed)
Spoke with pt caretaker, Junious Dresser, and informed her that pt insurance has denied coverage of the CT scan therefore we have cancelled the CT appointment and will await further instruction from Dr. Vicente Males. She agrees.

## 2018-08-18 ENCOUNTER — Encounter: Payer: Self-pay | Admitting: Gastroenterology

## 2018-08-18 ENCOUNTER — Ambulatory Visit: Payer: Medicaid Other

## 2018-08-18 ENCOUNTER — Telehealth: Payer: Self-pay

## 2018-08-18 NOTE — Telephone Encounter (Signed)
-----   Message from [name], MD sent at 08/18/2018  9:15 AM EDT ----- Sherald Hess inform Hep B/D negative. TSH is low and should follow up witj Mikey College, NP    Dr [name] MD,MRCP [facility]) Gastroenterology/Hepatology Pager: [phone]

## 2018-08-18 NOTE — Telephone Encounter (Signed)
Spoke with pt caretaker Junious Dresser, and informed her of pt lab results and Dr. Georgeann Oppenheim instructions for pt to follow up with PCP regarding his abnormal Thyroid level.

## 2018-08-19 ENCOUNTER — Ambulatory Visit: Payer: Medicaid Other

## 2018-08-28 ENCOUNTER — Other Ambulatory Visit: Payer: Self-pay | Admitting: Nurse Practitioner

## 2018-09-02 ENCOUNTER — Ambulatory Visit (INDEPENDENT_AMBULATORY_CARE_PROVIDER_SITE_OTHER): Payer: Medicaid Other | Admitting: Nurse Practitioner

## 2018-09-02 ENCOUNTER — Encounter: Payer: Self-pay | Admitting: Nurse Practitioner

## 2018-09-02 ENCOUNTER — Other Ambulatory Visit: Payer: Self-pay

## 2018-09-02 DIAGNOSIS — F419 Anxiety disorder, unspecified: Secondary | ICD-10-CM

## 2018-09-02 DIAGNOSIS — R7989 Other specified abnormal findings of blood chemistry: Secondary | ICD-10-CM | POA: Diagnosis not present

## 2018-09-02 DIAGNOSIS — R066 Hiccough: Secondary | ICD-10-CM

## 2018-09-02 DIAGNOSIS — F509 Eating disorder, unspecified: Secondary | ICD-10-CM | POA: Diagnosis not present

## 2018-09-02 DIAGNOSIS — G40909 Epilepsy, unspecified, not intractable, without status epilepticus: Secondary | ICD-10-CM

## 2018-09-02 DIAGNOSIS — R634 Abnormal weight loss: Secondary | ICD-10-CM

## 2018-09-02 DIAGNOSIS — E876 Hypokalemia: Secondary | ICD-10-CM

## 2018-09-02 DIAGNOSIS — E441 Mild protein-calorie malnutrition: Secondary | ICD-10-CM

## 2018-09-02 MED ORDER — CHLORPROMAZINE HCL 50 MG PO TABS: 50.0000 mg | ORAL_TABLET | Freq: Four times a day (QID) | ORAL | 5 refills | Status: DC

## 2018-09-02 MED ORDER — ACETAMINOPHEN 325 MG PO TABS: 325.0000 mg | ORAL_TABLET | ORAL | 11 refills | Status: DC | PRN

## 2018-09-02 MED ORDER — BACLOFEN 10 MG PO TABS: 10.0000 mg | ORAL_TABLET | Freq: Four times a day (QID) | ORAL | 5 refills | Status: DC

## 2018-09-02 MED ORDER — SERTRALINE HCL 25 MG PO TABS: 25.0000 mg | ORAL_TABLET | Freq: Every day | ORAL | 1 refills | Status: AC

## 2018-09-02 MED ORDER — PHENYTOIN SODIUM EXTENDED 100 MG PO CAPS: 200.0000 mg | ORAL_CAPSULE | Freq: Two times a day (BID) | ORAL | 5 refills | Status: DC

## 2018-09-02 MED ORDER — SENNA-DOCUSATE SODIUM 8.6-50 MG PO TABS: 1.0000 | ORAL_TABLET | Freq: Two times a day (BID) | ORAL | 5 refills | Status: DC

## 2018-09-02 MED ORDER — TRAZODONE HCL 100 MG PO TABS: 100.0000 mg | ORAL_TABLET | Freq: Every day | ORAL | 5 refills | Status: DC

## 2018-09-02 MED ORDER — POTASSIUM CHLORIDE ER 20 MEQ PO TBCR: 20.0000 meq | EXTENDED_RELEASE_TABLET | Freq: Every day | ORAL | 5 refills | Status: DC

## 2018-09-02 MED ORDER — PHENOBARBITAL 60 MG PO TABS: 60.0000 mg | ORAL_TABLET | Freq: Every day | ORAL | 5 refills | Status: DC

## 2018-09-02 NOTE — Progress Notes (Signed)
Telemedicine Encounter: Disclosed to patient at start of encounter that we will provide appropriate telemedicine services.  Patient consents to be treated via phone prior to discussion.  Patient is also accompanied by his primary caregiver Junious Dresser.  Some of our HPI is provided by [facility]. - Patient is at his home and is accessed via telephone. - Services are provided by Cassell Smiles from [facility].  Subjective:    Patient ID: Jeremiah Nguyen, male    DOB: 1956-01-11, 63 y.o.   MRN: [id number]  ISMAIL GRAZIANI is a 63 y.o. male presenting on 09/02/2018 for Seizures and Emesis (self induced)  HPI Seizure Last seizure was about 2 weeks ago.  Lasted about 3-4 minutes.  He was in his bedroom and was putting on socks - reported by his roommate.  Patient was rubbing face/neck.  Patient did not have post-ictal period. - Seizure prior was about 1-2   Weight loss Patient has had denial of CT scan by insurance as requested by Dr. Vicente Males.  Stella requests follow-up today for CT abdomen/pelvis - Weight loss, unknown etiology.   - Less vomiting now, but continues with this practice if he starts to feel too full.  Did hve some self this 1-2 times last week.  Hiccups are a little better so is vomiting less.  Patient also agrees he is vomiting less to control hiccups.  Social History   Tobacco Use  . Smoking status: Current Every Day Smoker    Packs/day: 0.75    Years: 30.00    Pack years: 22.50  . Smokeless tobacco: Never Used  Substance Use Topics  . Alcohol use: No  . Drug use: No   Review of Systems Per HPI unless specifically indicated above    Objective:    There were no vitals taken for this visit.  Wt Readings from Last 3 Encounters:  07/22/18 131 lb 6.4 oz (59.6 kg)  06/15/18 163 lb (73.9 kg)  05/30/18 130 lb 6.4 oz (59.1 kg)    Physical Exam Patient remotely monitored.  Verbal communication appropriate.  Cognition normal.   Results for orders placed or performed  in visit on 07/22/18  TSH  Result Value Ref Range   TSH 0.298 (L) 0.450 - 4.500 uIU/mL  Hepatitis B DNA, ultraquantitative, PCR  Result Value Ref Range   HBV DNA SERPL PCR-ACNC HBV DNA not detected IU/mL   HBV DNA SERPL PCR-LOG IU CANCELED log10 IU/mL   Test Information: Comment   Hepatitis D qRT-PCR (plasma)  Result Value Ref Range   HDV qRT-PCR (plasma) Not Detected Not Detected  HEPATITIS D ANTIGEN  Result Value Ref Range   Hepatitis D Antigen Negative Negative      Assessment & Plan:   Problem List Items Addressed This Visit      Nervous and Auditory   Seizure disorder (HCC) Last dilantin level lower than therapeutic, but patient had been recently started on sertraline and was likely to rise with continued dosing.  Patient with seizures lasting only 3-4 minutes at most 1 time per month with last seizure 2 weeks ago.  This pattern is not unusual for patient chronically.  Plan: 1. Resume sertraline prior to repeating drug levels for seizure control. 2. Consider future referral to neurology if difficulty managing drug levels continues.  Offered today and declined by patient and caregiver. 3. Labs in 6 weeks after resuming sertraline to check seizure drug levels.  May need increased dilantin dose to optimize seizure control. 4.  Call clinic for additional seizures. 5. Follow-up sooner if labs abnormal and in 6 months given relative stability of seizure disorder.   Relevant Medications   phenytoin (DILANTIN) 100 MG ER capsule   PHENObarbital (LUMINAL) 60 MG tablet   Other Relevant Orders   COMPLETE METABOLIC PANEL WITH GFR   Dilantin (Phenytoin) level, total   Phenobarbital level     Other   Eating disorder Patient with continued self-induced vomiting.  Has been off SSRI due to not having refills.  No recent follow-up with psychiatry as it appears they have missed an appointment.  Stella instructed to call to follow-up in about 6 weeks after being back on sertraline.   Medications refilled to bridge to this appointment. Labs repeated in 6 weeks.  Follow-up prn.   Relevant Medications   sertraline (ZOLOFT) 25 MG tablet   traZODone (DESYREL) 100 MG tablet   Other Relevant Orders   COMPLETE METABOLIC PANEL WITH GFR   Hemoglobin A1c    Other Visit Diagnoses    Low TSH level    -  Primary Stable today on exam and per patient report.  Medications tolerated without side effects.  Continue at current doses.  Refills provided.  Check labs with appointment in 6 weeks. Followup 6 months.    Relevant Orders   TSH   T4, free   Anxiety disorder, unspecified type     See eating disorder above.   Relevant Medications   sertraline (ZOLOFT) 25 MG tablet   traZODone (DESYREL) 100 MG tablet   Other Relevant Orders   Hemoglobin A1c   Intractable hiccoughs     Stable to mildly improved as patient cites less need to induce vomiting due to uncontrolled hiccoughs.     Relevant Medications   baclofen (LIORESAL) 10 MG tablet   chlorproMAZINE (THORAZINE) 50 MG tablet   Unintended weight loss       Mild protein-calorie malnutrition ([place])     Patient with continued decreased oral intake overall, but with less self-induced vomiting recently.  Patient notes this is associated with improved hiccoughs. No recent home weight checked, but a scale is available for use.  Plan: 1. Schedule monthly weight checks.  Call clinic if reduced weight to 120-125 lbs.   2. Can consider future CT scan abdomen/pelvis rule out malignancy as cause, but was previously denied by specialist through prior authorization. 3. Goal: optimize self-induced vomiting, eating disorder.   Continue monitoring weight and proceed with scan if weight not stabilizing or if significantly changes with > 10 lbs weight loss in next 3 months unintentionally. 4. FOLLOW-UP 6 months in clinic and continue eating disorder management with psychiatry.   Relevant Medications   Potassium Chloride ER 20 MEQ TBCR   Hypokalemia      Continue potassium.  Recheck with labs in 6 weeks.   Relevant Medications   Potassium Chloride ER 20 MEQ TBCR   Other Relevant Orders   COMPLETE METABOLIC PANEL WITH GFR      Meds ordered this encounter  Medications  . sertraline (ZOLOFT) 25 MG tablet    Sig: Take 1 tablet (25 mg total) by mouth daily.    Dispense:  30 tablet    Refill:  1    Refilled as bridge back to psychiatry only. Future refill requests to Dr. Ursula Alert.    Order Specific Question:   Supervising Provider    Answer:   Olin Hauser [[id number]]  . phenytoin (DILANTIN) 100 MG ER capsule  Sig: Take 2 capsules (200 mg total) by mouth 2 (two) times daily.    Dispense:  120 capsule    Refill:  5    Order Specific Question:   Supervising Provider    Answer:   Olin Hauser [[id number]]  . PHENObarbital (LUMINAL) 60 MG tablet    Sig: Take 1 tablet (60 mg total) by mouth at bedtime.    Dispense:  30 tablet    Refill:  5    Order Specific Question:   Supervising Provider    Answer:   Olin Hauser [[id number]]  . baclofen (LIORESAL) 10 MG tablet    Sig: Take 1 tablet (10 mg total) by mouth 4 (four) times daily.    Dispense:  120 tablet    Refill:  5    Order Specific Question:   Supervising Provider    Answer:   Olin Hauser [[id number]]  . acetaminophen (TYLENOL) 325 MG tablet    Sig: Take 1 tablet (325 mg total) by mouth every 4 (four) hours as needed.    Dispense:  60 tablet    Refill:  11    Order Specific Question:   Supervising Provider    Answer:   Olin Hauser [[id number]]  . chlorproMAZINE (THORAZINE) 50 MG tablet    Sig: Take 1 tablet (50 mg total) by mouth 4 (four) times daily.    Dispense:  120 tablet    Refill:  5    Order Specific Question:   Supervising Provider    Answer:   Olin Hauser [[id number]]  . Potassium Chloride ER 20 MEQ TBCR    Sig: Take 20 mEq by mouth daily.    Dispense:  30 tablet    Refill:  5    Order Specific Question:    Supervising Provider    Answer:   Olin Hauser [[id number]]  . sennosides-docusate sodium (SENOKOT-S) 8.6-50 MG tablet    Sig: Take 1 tablet by mouth 2 (two) times daily.    Dispense:  60 tablet    Refill:  5    Order Specific Question:   Supervising Provider    Answer:   Olin Hauser [[id number]]  . traZODone (DESYREL) 100 MG tablet    Sig: Take 1 tablet (100 mg total) by mouth at bedtime.    Dispense:  30 tablet    Refill:  5    Order Specific Question:   Supervising Provider    Answer:   Olin Hauser [[id number]]   - Time spent in direct consultation with patient via telemedicine about above concerns: 15 minutes  Follow up plan: Return in about 6 months (around 03/04/2019), or weight loss, seizure disorder WITH Labs 6 weeks.  Cassell Smiles, DNP, AGPCNP-BC Adult Gerontology Primary Care Nurse Practitioner [facility] 09/02/2018, 11:03 AM

## 2018-10-22 ENCOUNTER — Ambulatory Visit (INDEPENDENT_AMBULATORY_CARE_PROVIDER_SITE_OTHER): Payer: Medicaid Other | Admitting: Gastroenterology

## 2018-10-22 ENCOUNTER — Other Ambulatory Visit: Payer: Self-pay

## 2018-10-22 DIAGNOSIS — R634 Abnormal weight loss: Secondary | ICD-10-CM | POA: Diagnosis not present

## 2018-10-22 DIAGNOSIS — R945 Abnormal results of liver function studies: Secondary | ICD-10-CM

## 2018-10-22 NOTE — Progress Notes (Addendum)
[name] , MD [address]  [place]  [place], [place] [address]  Main: [phone]  Fax: [phone]   Primary Care Physician: Mikey College, NP  Virtual Visit via Telephone Note  I connected with patient on 10/22/18 at 11:00 AM EDT by telephone and verified that I am speaking with the correct person using two identifiers.   I discussed the limitations, risks, security and privacy concerns of performing an evaluation and management service by telephone and the availability of in person appointments. I also discussed with the patient that there may be a patient responsible charge related to this service. The patient expressed understanding and agreed to proceed.  Location of Patient: Home Location of Provider: Home Persons involved: Patient and provider only   History of Present Illness: Chief Complaint  Patient presents with   Follow-up    Abnormal Weight Loss    HPI: Jeremiah Nguyen is a 63 y.o. male  Summary of history :  He was initially referred and seen on 02/11/2018 for weight loss 30 lbs since jan 2019  and hiccups.He has hiccups, on and off, occurs every day , each episode lasts till he throws up or goes to bed. . This has been ongoing for many years.  01/03/18: Alk phos elevated at 152, HIV ,Hep C ab negative. CBC,TSH-normal .HBa1c normal   Sister who says that he induces vomiting when he thinks he gains weight., he was actually showing me the steps he performs to induce vomiting.  02/12/2018 alkaline phosphatase 130 isoenzymes are all within normal limits. Gamma GT 81, PTH normal Upper endoscopy on 02/25/2018. No abnormality.Marland Kitchen He cannot do the colonoscopy because he could not do the prep. 03/2018 : MRI brain shows white matter disease -chronic microvascular ischemia.  H pylori breath test negative. Hep B e antigen was positive. Iron % saturation 87%. Hb 14.1 grams , Ferritin 49 , Hep A ab positive. Hep bsag,ceruloplasmin, factin,AMA  -  negative.    CT scan of the chest abdomen pelvis was ordered but has not been performed yet as it was declined by his insurance     Interval history3/07/2018 -10/22/2018  Still throwing up- doing better- may be same weight or stable, not yet followed up with his psychiatrist.    Current Outpatient Medications  Medication Sig Dispense Refill   acetaminophen (TYLENOL) 325 MG tablet Take 1 tablet (325 mg total) by mouth every 4 (four) hours as needed. 60 tablet 11   baclofen (LIORESAL) 10 MG tablet Take 1 tablet (10 mg total) by mouth 4 (four) times daily. 120 tablet 5   buPROPion (WELLBUTRIN) 75 MG tablet Take 1 tablet (75 mg total) by mouth daily. 30 tablet 1   chlorproMAZINE (THORAZINE) 50 MG tablet Take 1 tablet (50 mg total) by mouth 4 (four) times daily. 120 tablet 5   hydrOXYzine (VISTARIL) 25 MG capsule Take 1 capsule (25 mg total) by mouth at bedtime as needed. For sleep 30 capsule 1   omeprazole (PRILOSEC) 40 MG capsule Take 1 capsule (40 mg total) by mouth daily. 90 capsule 3   PHENObarbital (LUMINAL) 60 MG tablet Take 1 tablet (60 mg total) by mouth at bedtime. 30 tablet 5   phenytoin (DILANTIN) 100 MG ER capsule Take 2 capsules (200 mg total) by mouth 2 (two) times daily. 120 capsule 5   Potassium Chloride ER 20 MEQ TBCR Take 20 mEq by mouth daily. 30 tablet 5   sennosides-docusate sodium (SENOKOT-S) 8.6-50 MG tablet Take  1 tablet by mouth 2 (two) times daily. 60 tablet 5   sertraline (ZOLOFT) 25 MG tablet Take 1 tablet (25 mg total) by mouth daily. 30 tablet 1   traZODone (DESYREL) 100 MG tablet Take 1 tablet (100 mg total) by mouth at bedtime. 30 tablet 5   No current facility-administered medications for this visit.     Allergies as of 10/22/2018   (No Known Allergies)    Review of Systems:    All systems reviewed and negative except where noted in HPI.   Observations/Objective:  Labs: CMP     Component Value Date/Time   NA 139 06/15/2018 1310     K 3.8 06/15/2018 1310   CL 104 06/15/2018 1310   CO2 27 06/15/2018 1310   GLUCOSE 71 06/15/2018 1310   BUN 7 (L) 06/15/2018 1310   CREATININE 0.81 06/15/2018 1310   CREATININE 0.86 01/03/2018 1017   CALCIUM 9.1 06/15/2018 1310   PROT 7.1 03/26/2018 1350   ALBUMIN 3.9 03/26/2018 1350   AST 24 03/26/2018 1350   ALT 15 03/26/2018 1350   ALKPHOS 102 03/26/2018 1350   BILITOT 0.8 03/26/2018 1350   GFRNONAA >60 06/15/2018 1310   GFRNONAA 92 01/03/2018 1017   GFRAA >60 06/15/2018 1310   GFRAA 107 01/03/2018 1017   Lab Results  Component Value Date   WBC 5.4 06/15/2018   HGB 15.1 06/15/2018   HCT 45.4 06/15/2018   MCV 91.0 06/15/2018   PLT 275 06/15/2018    Imaging Studies: No results found.  Assessment and Plan:   Jeremiah Nguyen is a 63 y.o. y/o malehere to follow up forhiccups,and unintentional weight loss. Being followed by a psychiatrist for self induced vomiting . Hep B e antigen positive with surface antigen negative, delta antigen negative. Unable to get the CT scan of chest/abdomen and pelvis as insurance refused despite multiple attempts from our office. Today he is about the same , not yet seen his psychiatrist due to [place]  1. CMP, GGT 2. F/u with psychiatry  3. F/u with me in2 months    I discussed the assessment and treatment plan with the patient. The patient was provided an opportunity to ask questions and all were answered. The patient agreed with the plan and demonstrated an understanding of the instructions.   The patient was advised to call back or seek an in-person evaluation if the symptoms worsen or if the condition fails to improve as anticipated.  I provided 12 minutes of non-face-to-face time during this encounter.  Dr [name] MD,MRCP [facility]) Gastroenterology/Hepatology Pager: [phone]   Speech recognition software was used to dictate this note.

## 2018-10-27 ENCOUNTER — Ambulatory Visit: Payer: Medicaid Other | Admitting: Gastroenterology

## 2018-10-31 ENCOUNTER — Other Ambulatory Visit
Admission: RE | Admit: 2018-10-31 | Discharge: 2018-10-31 | Disposition: A | Discharge status: Home / Self Care | Payer: Medicaid Other | Source: Ambulatory Visit | Attending: Gastroenterology | Admitting: Gastroenterology

## 2018-10-31 DIAGNOSIS — R945 Abnormal results of liver function studies: Secondary | ICD-10-CM | POA: Diagnosis not present

## 2018-10-31 DIAGNOSIS — R634 Abnormal weight loss: Secondary | ICD-10-CM | POA: Diagnosis not present

## 2018-10-31 LAB — COMPREHENSIVE METABOLIC PANEL
ALT: 20 U/L (ref 0–44)
AST: 23 U/L (ref 15–41)
Albumin: 3.7 g/dL (ref 3.5–5.0)
Alkaline Phosphatase: 138 U/L — ABNORMAL HIGH (ref 38–126)
Anion gap: 9 (ref 5–15)
BUN: 6 mg/dL — ABNORMAL LOW (ref 8–23)
CO2: 28 mmol/L (ref 22–32)
Calcium: 8.8 mg/dL — ABNORMAL LOW (ref 8.9–10.3)
Chloride: 101 mmol/L (ref 98–111)
Creatinine, Ser: 0.66 mg/dL (ref 0.61–1.24)
GFR calc Af Amer: >60 mL/min (ref >60)
GFR calc non Af Amer: >60 mL/min (ref >60)
Glucose, Bld: 91 mg/dL (ref 70–99)
Potassium: 3.5 mmol/L (ref 3.5–5.1)
Sodium: 138 mmol/L (ref 135–145)
Total Bilirubin: 0.4 mg/dL (ref 0.3–1.2)
Total Protein: 6.8 g/dL (ref 6.5–8.1)

## 2018-11-01 LAB — GAMMA GT: GGT: 60 U/L — ABNORMAL HIGH (ref 7–50)

## 2018-11-18 ENCOUNTER — Encounter: Payer: Self-pay | Admitting: Gastroenterology

## 2019-02-13 ENCOUNTER — Other Ambulatory Visit: Payer: Self-pay | Admitting: Nurse Practitioner

## 2019-02-13 DIAGNOSIS — G40909 Epilepsy, unspecified, not intractable, without status epilepticus: Secondary | ICD-10-CM

## 2019-02-13 MED ORDER — PHENOBARBITAL 60 MG PO TABS: 60.0000 mg | ORAL_TABLET | Freq: Every day | ORAL | 0 refills | Status: DC

## 2019-02-13 NOTE — Telephone Encounter (Signed)
Pt need refill, already contacted pharmacy, does he need to set up appt? Contact Stella at [phone]   PHENObarbital (LUMINAL) 60 MG tablet [id number]

## 2019-02-25 ENCOUNTER — Encounter: Payer: Self-pay | Admitting: Nurse Practitioner

## 2019-02-25 ENCOUNTER — Other Ambulatory Visit: Payer: Self-pay

## 2019-02-25 ENCOUNTER — Ambulatory Visit (INDEPENDENT_AMBULATORY_CARE_PROVIDER_SITE_OTHER): Payer: Medicaid Other | Admitting: Nurse Practitioner

## 2019-02-25 VITALS — BP 107/53 | HR 98 | Ht 69.0 in | Wt 131.6 lb

## 2019-02-25 DIAGNOSIS — K769 Liver disease, unspecified: Secondary | ICD-10-CM

## 2019-02-25 DIAGNOSIS — F5089 Other specified eating disorder: Secondary | ICD-10-CM | POA: Diagnosis not present

## 2019-02-25 DIAGNOSIS — Z23 Encounter for immunization: Secondary | ICD-10-CM | POA: Diagnosis not present

## 2019-02-25 DIAGNOSIS — K219 Gastro-esophageal reflux disease without esophagitis: Secondary | ICD-10-CM | POA: Diagnosis not present

## 2019-02-25 DIAGNOSIS — G40909 Epilepsy, unspecified, not intractable, without status epilepticus: Secondary | ICD-10-CM | POA: Diagnosis not present

## 2019-02-25 NOTE — Patient Instructions (Addendum)
Jeremiah Nguyen,   Thank you for coming in to clinic today.  1. Labs today  2. Continue medications today without changes  3. Continue eating your cakes to keep weight stable.  Continue your regular meals.  Continue snacks as you want.  Please schedule a follow-up appointment. Return in about 6 months (around 08/26/2019) for GERD.  If you have any other questions or concerns, please feel free to call the clinic or send a message through [place]. You may also schedule an earlier appointment if necessary.  You will receive a survey after today's visit either digitally by e-mail or paper by [employer]. Your experiences and feedback matter to [place].  Please respond so we know how we are doing as we provide care for you.  Cassell Smiles, DNP, AGNP-BC Adult Gerontology Nurse Practitioner [facility], [facility]  Influenza (Flu) Vaccine (Inactivated or Recombinant): What You Need to Know 1. Why get vaccinated? Influenza vaccine can prevent influenza (flu). Flu is a contagious disease that spreads around the [place] every year, usually between October and May. Anyone can get the flu, but it is more dangerous for some people. Infants and young children, people 47 years of age and older, pregnant women, and people with certain health conditions or a weakened immune system are at greatest risk of flu complications. Pneumonia, bronchitis, sinus infections and ear infections are examples of flu-related complications. If you have a medical condition, such as heart disease, cancer or diabetes, flu can make it worse. Flu can cause fever and chills, sore throat, muscle aches, fatigue, cough, headache, and runny or stuffy nose. Some people may have vomiting and diarrhea, though this is more common in children than adults. Each year thousands of people in the [place] die from flu, and many more are hospitalized. Flu vaccine prevents millions of illnesses and flu-related visits to the doctor  each year. 2. Influenza vaccine CDC recommends everyone 81 months of age and older get vaccinated every flu season. Children 6 months through 24 years of age may need 2 doses during a single flu season. Everyone else needs only 1 dose each flu season. It takes about 2 weeks for protection to develop after vaccination. There are many flu viruses, and they are always changing. Each year a new flu vaccine is made to protect against three or four viruses that are likely to cause disease in the upcoming flu season. Even when the vaccine doesn't exactly match these viruses, it may still provide some protection. Influenza vaccine does not cause flu. Influenza vaccine may be given at the same time as other vaccines. 3. Talk with your health care provider Tell your vaccine provider if the person getting the vaccine:  Has had an allergic reaction after a previous dose of influenza vaccine, or has any severe, life-threatening allergies.  Has ever had Guillain-Barr Syndrome (also called GBS). In some cases, your health care provider may decide to postpone influenza vaccination to a future visit. People with minor illnesses, such as a cold, may be vaccinated. People who are moderately or severely ill should usually wait until they recover before getting influenza vaccine. Your health care provider can give you more information. 4. Risks of a vaccine reaction  Soreness, redness, and swelling where shot is given, fever, muscle aches, and headache can happen after influenza vaccine.  There may be a very small increased risk of Guillain-Barr Syndrome (GBS) after inactivated influenza vaccine (the flu shot). Young children who get the flu shot  along with pneumococcal vaccine (PCV13), and/or DTaP vaccine at the same time might be slightly more likely to have a seizure caused by fever. Tell your health care provider if a child who is getting flu vaccine has ever had a seizure. People sometimes faint after medical  procedures, including vaccination. Tell your provider if you feel dizzy or have vision changes or ringing in the ears. As with any medicine, there is a very remote chance of a vaccine causing a severe allergic reaction, other serious injury, or death. 5. What if there is a serious problem? An allergic reaction could occur after the vaccinated person leaves the clinic. If you see signs of a severe allergic reaction (hives, swelling of the face and throat, difficulty breathing, a fast heartbeat, dizziness, or weakness), call 9-1-1 and get the person to the nearest hospital. For other signs that concern you, call your health care provider. Adverse reactions should be reported to the Vaccine Adverse Event Reporting System (VAERS). Your health care provider will usually file this report, or you can do it yourself. Visit the VAERS website at www.vaers.SamedayNews.es or call 928-197-6131.VAERS is only for reporting reactions, and VAERS staff do not give medical advice. 6. The National Vaccine Injury Compensation Program The [employer] Vaccine Injury Compensation Program (VICP) is a federal program that was created to compensate people who may have been injured by certain vaccines. Visit the VICP website at GoldCloset.com.ee or call 865-486-2701 to learn about the program and about filing a claim. There is a time limit to file a claim for compensation. 7. How can I learn more?  Ask your healthcare provider.  Call your local or state health department.  Contact the Centers for Disease Control and Prevention (CDC): ? Call 360-191-0345 (1-800-CDC-INFO) or ? Visit CDC's https://gibson.com/ Vaccine Information Statement (Interim) Inactivated Influenza Vaccine (01/02/2018) This information is not intended to replace advice given to you by your health care provider. Make sure you discuss any questions you have with your health care provider. Document Released: 03/01/2006 Document Revised: 08/26/2018  Document Reviewed: 01/06/2018 Elsevier Patient Education  2020 [employer].

## 2019-02-25 NOTE — Progress Notes (Signed)
Subjective:    Patient ID: Jeremiah Nguyen, male    DOB: [birth year], 63 y.o.   MRN: [id number]  Jeremiah Nguyen is a 63 y.o. male presenting on 02/25/2019 for Gastroesophageal Reflux  Patient is accompanied today by his group home manager, Junious Dresser.  HPI  GERD Patient has stopped hiccups for now, but does occasionally still have hiccups. - Patient denies vomiting.  Junious Dresser notes he still complains of chest hurting - giving tylenol Sunday.  He has had a couple of weeks since prior vomiting.   - Patient is eating cakes (chocolate, cream-filled) to help with weight gain/stablity. - omeprazole 40 mg once daily - Occasionally finds his pills on the floor from his med cup. - Seems a bit stronger per Junious Dresser  Seizure disorder Patient was having seizures often (every 2-3 days) in early spring - lasting 2-3 minutes with wiping face/objects, lack of focus.  For last 2 months has had no seizures.  Patient has had no neurology follow-up recently as he has been stable on current meds for several years.   Social History   Tobacco Use  . Smoking status: Current Every Day Smoker    Packs/day: 0.75    Years: 30.00    Pack years: 22.50  . Smokeless tobacco: Never Used  Substance Use Topics  . Alcohol use: No  . Drug use: No    Review of Systems Per HPI unless specifically indicated above     Objective:    BP (!) 107/53 (BP Location: Right Arm, Patient Position: Sitting, Cuff Size: Normal)   Pulse 98   Ht 5\' 9"  (1.753 m)   Wt 131 lb 9.6 oz (59.7 kg)   BMI 19.43 kg/m   Wt Readings from Last 3 Encounters:  02/25/19 131 lb 9.6 oz (59.7 kg)  07/22/18 131 lb 6.4 oz (59.6 kg)  06/15/18 163 lb (73.9 kg)    Physical Exam Vitals signs reviewed.  Constitutional:      General: He is not in acute distress.    Appearance: Normal appearance. He is well-developed.  HENT:     Head: Normocephalic and atraumatic.  Neck:     Musculoskeletal: Normal range of motion and neck supple.  Cardiovascular:      Rate and Rhythm: Normal rate and regular rhythm.     Pulses: Normal pulses.     Heart sounds: Normal heart sounds. No murmur. No friction rub. No gallop.   Pulmonary:     Effort: Pulmonary effort is normal.     Breath sounds: Normal breath sounds.  Abdominal:     General: Abdomen is flat. Bowel sounds are normal. There is no distension.     Palpations: Abdomen is soft. There is no mass.     Tenderness: There is no abdominal tenderness. There is no guarding.     Hernia: No hernia is present.  Skin:    General: Skin is warm and dry.     Capillary Refill: Capillary refill takes less than 2 seconds.  Neurological:     General: No focal deficit present.     Mental Status: He is alert and oriented to person, place, and time. Mental status is at baseline.     Cranial Nerves: No cranial nerve deficit.     Sensory: No sensory deficit.     Motor: No weakness.     Gait: Gait normal.  Psychiatric:        Mood and Affect: Mood normal.        Behavior:  Behavior normal.        Thought Content: Thought content normal.        Judgment: Judgment normal.    Results for orders placed or performed in visit on 02/25/19  Phenobarbital level  Result Value Ref Range   Phenobarbital, Serum 19.8 15.0 - 40.0 mg/L  Dilantin (Phenytoin) level, total  Result Value Ref Range   Phenytoin, Total 6.6 (L) 10.0 - 20.0 mg/L  CBC with Differential/Platelet  Result Value Ref Range   WBC 5.2 3.8 - 10.8 Thousand/uL   RBC 4.77 4.20 - 5.80 Million/uL   Hemoglobin 14.4 13.2 - 17.1 g/dL   HCT 41.4 38.5 - 50.0 %   MCV 86.8 80.0 - 100.0 fL   MCH 30.2 27.0 - 33.0 pg   MCHC 34.8 32.0 - 36.0 g/dL   RDW 13.1 11.0 - 15.0 %   Platelets 288 140 - 400 Thousand/uL   MPV 10.0 7.5 - 12.5 fL   Neutro Abs 3,052 1,500 - 7,800 cells/uL   Lymphs Abs 1,440 850 - 3,900 cells/uL   Absolute Monocytes 442 200 - 950 cells/uL   Eosinophils Absolute 198 15 - 500 cells/uL   Basophils Absolute 68 0 - 200 cells/uL   Neutrophils  Relative % 58.7 %   Total Lymphocyte 27.7 %   Monocytes Relative 8.5 %   Eosinophils Relative 3.8 %   Basophils Relative 1.3 %  Comprehensive metabolic panel  Result Value Ref Range   Glucose, Bld 94 65 - 99 mg/dL   BUN 7 7 - 25 mg/dL   Creat 0.84 0.70 - 1.25 mg/dL   BUN/Creatinine Ratio NOT APPLICABLE 6 - 22 (calc)   Sodium 139 135 - 146 mmol/L   Potassium 4.0 3.5 - 5.3 mmol/L   Chloride 103 98 - 110 mmol/L   CO2 32 20 - 32 mmol/L   Calcium 9.2 8.6 - 10.3 mg/dL   Total Protein 6.6 6.1 - 8.1 g/dL   Albumin 4.0 3.6 - 5.1 g/dL   Globulin 2.6 1.9 - 3.7 g/dL (calc)   AG Ratio 1.5 1.0 - 2.5 (calc)   Total Bilirubin 0.5 0.2 - 1.2 mg/dL   Alkaline phosphatase (APISO) 137 35 - 144 U/L   AST 16 10 - 35 U/L   ALT 13 9 - 46 U/L      Assessment & Plan:   Problem List Items Addressed This Visit      Digestive   Gastroesophageal reflux disease - Primary Stable today on exam.  Medications tolerated without side effects.  Continue at current doses.  Refills provided.  Check labs today. Followup 6 months.    Relevant Orders   CBC with Differential/Platelet (Completed)   Liver disease, chronic Stable on last labs.  No evidence of cirrhosis or ascites today.  Recheck labs, continue with GI.   Relevant Orders   Comprehensive metabolic panel (Completed)     Nervous and Auditory   Seizure disorder ([place]) Worsening with recurrent seizures in April.  Patient not seen in clinic since that time.  New SSRI likely culprit in reduced dilantin level. - Drug levels today - Increase dilantin as ordered. - Referral neurology recommended. - Follow-up 3 months and in 1 month for repeat dilantin level.   Relevant Orders   Phenobarbital level (Completed)   Dilantin (Phenytoin) level, total (Completed)     Other   Eating disorder Stable, bulimia/purging continues, but purging is less frequent.  Worsening GERD symptoms due to intentional purging. - Continue PPI - CBC for  assessment of bleeding, anemia  - Follow-up with psychiatry   Relevant Orders   CBC with Differential/Platelet (Completed)    Other Visit Diagnoses    Needs flu shot       Relevant Orders   Flu Vaccine QUAD 6+ mos PF IM (Fluarix Quad PF) (Completed)      Follow up plan: Return in about 6 months (around 08/26/2019) for GERD.  Cassell Smiles, DNP, AGPCNP-BC Adult Gerontology Primary Care Nurse Practitioner [facility] 02/25/2019, 10:46 AM

## 2019-02-26 ENCOUNTER — Other Ambulatory Visit: Payer: Self-pay | Admitting: Nurse Practitioner

## 2019-02-26 DIAGNOSIS — G40909 Epilepsy, unspecified, not intractable, without status epilepticus: Secondary | ICD-10-CM

## 2019-02-26 MED ORDER — PHENYTOIN SODIUM EXTENDED 100 MG PO CAPS: ORAL_CAPSULE | ORAL | 2 refills | Status: DC

## 2019-02-27 LAB — COMPREHENSIVE METABOLIC PANEL
AG Ratio: 1.5 (calc) (ref 1.0–2.5)
ALT: 13 U/L (ref 9–46)
AST: 16 U/L (ref 10–35)
Albumin: 4 g/dL (ref 3.6–5.1)
Alkaline phosphatase (APISO): 137 U/L (ref 35–144)
BUN: 7 mg/dL (ref 7–25)
CO2: 32 mmol/L (ref 20–32)
Calcium: 9.2 mg/dL (ref 8.6–10.3)
Chloride: 103 mmol/L (ref 98–110)
Creat: 0.84 mg/dL (ref 0.70–1.25)
Globulin: 2.6 g/dL (calc) (ref 1.9–3.7)
Glucose, Bld: 94 mg/dL (ref 65–99)
Potassium: 4 mmol/L (ref 3.5–5.3)
Sodium: 139 mmol/L (ref 135–146)
Total Bilirubin: 0.5 mg/dL (ref 0.2–1.2)
Total Protein: 6.6 g/dL (ref 6.1–8.1)

## 2019-02-27 LAB — CBC WITH DIFFERENTIAL/PLATELET
Absolute Monocytes: 442 cells/uL (ref 200–950)
Basophils Absolute: 68 cells/uL (ref 0–200)
Basophils Relative: 1.3 %
Eosinophils Absolute: 198 cells/uL (ref 15–500)
Eosinophils Relative: 3.8 %
HCT: 41.4 % (ref 38.5–50.0)
Hemoglobin: 14.4 g/dL (ref 13.2–17.1)
Lymphs Abs: 1440 cells/uL (ref 850–3900)
MCH: 30.2 pg (ref 27.0–33.0)
MCHC: 34.8 g/dL (ref 32.0–36.0)
MCV: 86.8 fL (ref 80.0–100.0)
MPV: 10 fL (ref 7.5–12.5)
Monocytes Relative: 8.5 %
Neutro Abs: 3052 cells/uL (ref 1500–7800)
Neutrophils Relative %: 58.7 %
Platelets: 288 10*3/uL (ref 140–400)
RBC: 4.77 10*6/uL (ref 4.20–5.80)
RDW: 13.1 % (ref 11.0–15.0)
Total Lymphocyte: 27.7 %
WBC: 5.2 10*3/uL (ref 3.8–10.8)

## 2019-02-27 LAB — PHENYTOIN LEVEL, TOTAL: Phenytoin, Total: 6.6 mg/L — ABNORMAL LOW (ref 10.0–20.0)

## 2019-02-27 LAB — PHENOBARBITAL LEVEL: Phenobarbital, Serum: 19.8 mg/L (ref 15.0–40.0)

## 2019-03-02 ENCOUNTER — Telehealth: Payer: Self-pay | Admitting: Nurse Practitioner

## 2019-03-02 NOTE — Telephone Encounter (Signed)
The pt caregiver was notified of lab results and the need to take his medication back to the pharmacy for repacking because the directions changed for how he should take his dilantin. She verbalize understanding.

## 2019-03-02 NOTE — Telephone Encounter (Signed)
Pt's caregiver called for patients lab results.  CB#  [phone]  Con Memos

## 2019-03-11 ENCOUNTER — Encounter: Payer: Self-pay | Admitting: Nurse Practitioner

## 2019-03-12 ENCOUNTER — Other Ambulatory Visit: Payer: Self-pay | Admitting: Nurse Practitioner

## 2019-03-12 DIAGNOSIS — G40909 Epilepsy, unspecified, not intractable, without status epilepticus: Secondary | ICD-10-CM

## 2019-03-12 NOTE — Telephone Encounter (Signed)
Fax is signed and in my out fax box.

## 2019-03-12 NOTE — Telephone Encounter (Signed)
Ms. Jeremiah Nguyen called requesting refill on phenobarbital,

## 2019-03-20 ENCOUNTER — Other Ambulatory Visit: Payer: Self-pay

## 2019-05-19 ENCOUNTER — Other Ambulatory Visit: Payer: Self-pay | Admitting: Family Medicine

## 2019-05-19 DIAGNOSIS — F509 Eating disorder, unspecified: Secondary | ICD-10-CM

## 2019-05-19 DIAGNOSIS — F419 Anxiety disorder, unspecified: Secondary | ICD-10-CM

## 2019-05-19 MED ORDER — MIRTAZAPINE 15 MG PO TABS: 15.0000 mg | ORAL_TABLET | Freq: Every day | ORAL | 3 refills | Status: DC

## 2019-05-19 NOTE — Addendum Note (Signed)
Addended by: Olin Hauser on: 05/19/2019 04:44 PM   Modules accepted: Orders

## 2019-06-03 ENCOUNTER — Ambulatory Visit (INDEPENDENT_AMBULATORY_CARE_PROVIDER_SITE_OTHER): Payer: Medicaid Other | Admitting: Family Medicine

## 2019-06-03 ENCOUNTER — Other Ambulatory Visit: Payer: Self-pay

## 2019-06-03 ENCOUNTER — Encounter: Payer: Self-pay | Admitting: Family Medicine

## 2019-06-03 VITALS — BP 100/50 | HR 99 | Temp 98.6°F | Ht 69.0 in | Wt 124.8 lb

## 2019-06-03 DIAGNOSIS — E441 Mild protein-calorie malnutrition: Secondary | ICD-10-CM | POA: Insufficient documentation

## 2019-06-03 DIAGNOSIS — F502 Bulimia nervosa: Secondary | ICD-10-CM

## 2019-06-03 DIAGNOSIS — F419 Anxiety disorder, unspecified: Secondary | ICD-10-CM

## 2019-06-03 DIAGNOSIS — J432 Centrilobular emphysema: Secondary | ICD-10-CM

## 2019-06-03 DIAGNOSIS — Z79899 Other long term (current) drug therapy: Secondary | ICD-10-CM

## 2019-06-03 DIAGNOSIS — K219 Gastro-esophageal reflux disease without esophagitis: Secondary | ICD-10-CM | POA: Diagnosis not present

## 2019-06-03 DIAGNOSIS — G40909 Epilepsy, unspecified, not intractable, without status epilepticus: Secondary | ICD-10-CM | POA: Diagnosis not present

## 2019-06-03 DIAGNOSIS — F1021 Alcohol dependence, in remission: Secondary | ICD-10-CM | POA: Insufficient documentation

## 2019-06-03 NOTE — Patient Instructions (Addendum)
Thank you for coming to the office today.  Last seen by Dr Shea Evans 06/30/18  [place] ([place] [facility]) Address: [address], [place], [place] [address] Hours: 8:30AM-5PM Phone: [phone]    We will check Phenytoin (Dilantin) and Phenobarbital levels. And notify you of results. Also kidney function.  Please schedule a Follow-up Appointment to: Return in about 6 months (around 12/01/2019) for 6 months follow-up eating disorder / seizures / psych w/ new provider.  If you have any other questions or concerns, please feel free to call the office or send a message through [place]. You may also schedule an earlier appointment if necessary.  Additionally, you may be receiving a survey about your experience at our office within a few days to 1 week by e-mail or mail. We value your feedback.  Nobie Putnam, DO [place]

## 2019-06-03 NOTE — Progress Notes (Signed)
Subjective:    Patient ID: Jeremiah Nguyen, male    DOB: [birth year], 64 y.o.   MRN: [id number]  Jeremiah Nguyen is a 64 y.o. male presenting on 06/03/2019 for Seizures (need fl-2 form filled ut)  Previous PCP Jeremiah Nguyen, AGPCNP-BC   Here today with Jeremiah Nguyen from [facility], who provides additional history.  Due for [facility] renewal signed form.  HPI   Seizure Disorder Controlled. He has been managed by prior PCP on Dilantin 200mg  in AM and 300mg  in PM. He has been managed on this for while now. Has had prior blood levels of Dilantin. Last checked in 02/2019 was low, had normal Phenobarbital levels. He was advised if need can refer to Neurologist in future. Not missing doses on either medication Last seizure few months ago, has 1 x episode every 2-3 months, usually repetitive motions with arms or hands and he does a lot of "rubbing" motions, but has not had any harm to self or others.  GERD Controlled on PPI Omeprazole 40mg  daily. Worse if vomit. See below. Previously seen by Jeremiah Nguyen at [place] [facility] / Weight Loss, History Previously followed by prior PCP and GI specialist. He was referred to [facility]iatry [facility] - saw Jeremiah Nguyen in 06/2018, she adjusted meds reduced wellbutrin 75mg  daily and continued sertraline Concern with purging behaviors. He has had history of weight loss. He has had purging or vomiting and may be losing medication after taking and meds may not be absorbed. Patient has not endorsed any pills coming back up with vomit They have not returned to Psychiatry since that time.  Chronic Liver, Disease History of alcohol in remission  Centrilobular Emphysema / Tobacco Abuse Chronic problem. Overall does well. Not on maintenance therapy. Smokes actively.  Health Maintenance:  Colon cancer screening - Not completed. Attempted by Jeremiah Nguyen [employer] but they could not get approval. Offered cologuard.  Depression screen Jeremiah Nguyen 2/9 02/25/2019 08/22/2017 05/24/2017    Decreased Interest 0 0 -  Down, Depressed, Hopeless 0 0 0  PHQ - 2 Score 0 0 0  Altered sleeping - 0 3  Tired, decreased energy - 0 3  Change in appetite - 0 0  Feeling bad or failure about yourself  - 0 0  Trouble concentrating - 0 2  Moving slowly or fidgety/restless - 0 -  Suicidal thoughts - 0 0  PHQ-9 Score - 0 8  Difficult doing work/chores - Not difficult at all Somewhat difficult    Past Medical History:  Diagnosis Date  . COPD (chronic obstructive pulmonary disease) ([place])   . Eating disorder   . GERD (gastroesophageal reflux disease)   . Liver disease   . Seizures ([place])   . Sleep apnea    Past Surgical History:  Procedure Laterality Date  . ESOPHAGOGASTRODUODENOSCOPY (EGD) WITH PROPOFOL N/A 02/25/2018   Procedure: ESOPHAGOGASTRODUODENOSCOPY (EGD) WITH PROPOFOL;  Surgeon: [name], MD;  Location: [facility] ENDOSCOPY;  Service: Gastroenterology;  Laterality: N/A;  . LESION EXCISION Left [id number]   follicular cyst  . SKIN BIOPSY     Social History   Socioeconomic History  . Marital status: Single    Spouse name: Not on file  . Number of children: 0  . Years of education: Not on file  . Highest education level: Not on file  Occupational History  . Not on file  Tobacco Use  . Smoking status: Current Every Day Smoker    Packs/day: 0.75    Years: 30.00  Pack years: 22.50  . Smokeless tobacco: Never Used  Substance and Sexual Activity  . Alcohol use: No  . Drug use: No  . Sexual activity: Not Currently  Other Topics Concern  . Not on file  Social History Narrative  . Not on file   Social Determinants of Health   Financial Resource Strain: Low Risk   . Difficulty of Paying Living Expenses: Not hard at all  Food Insecurity: No Food Insecurity  . Worried About Charity fundraiser in the Last Year: Never true  . Ran Out of Food in the Last Year: Never true  Transportation Needs: No Transportation Needs  . Lack of Transportation (Medical): No  . Lack of  Transportation (Non-Medical): No  Physical Activity: Inactive  . Days of Exercise per Week: 0 days  . Minutes of Exercise per Session: 0 min  Stress: Stress Concern Present  . Feeling of Stress : Very much  Social Connections: Unknown  . Frequency of Communication with Friends and Family: Not on file  . Frequency of Social Gatherings with Friends and Family: Not on file  . Attends Religious Services: Not on file  . Active Member of Clubs or Organizations: Not on file  . Attends Archivist Meetings: Not on file  . Marital Status: Never married  Intimate Partner Violence: Not At Risk  . Fear of Current or Ex-Partner: No  . Emotionally Abused: No  . Physically Abused: No  . Sexually Abused: No   Family History  Problem Relation Age of Onset  . Cancer Mother        breast   Current Outpatient Medications on File Prior to Visit  Medication Sig  . acetaminophen (TYLENOL) 325 MG tablet Take 1 tablet (325 mg total) by mouth every 4 (four) hours as needed.  . baclofen (LIORESAL) 10 MG tablet Take 1 tablet (10 mg total) by mouth 4 (four) times daily.  Marland Kitchen buPROPion (WELLBUTRIN) 75 MG tablet Take 1 tablet (75 mg total) by mouth daily.  . chlorproMAZINE (THORAZINE) 50 MG tablet Take 1 tablet (50 mg total) by mouth 4 (four) times daily.  . mirtazapine (REMERON) 15 MG tablet Take 1 tablet (15 mg total) by mouth at bedtime.  Marland Kitchen omeprazole (PRILOSEC) 40 MG capsule Take 1 capsule (40 mg total) by mouth daily.  Marland Kitchen PHENObarbital (LUMINAL) 60 MG tablet Take 1 tablet (60 mg total) by mouth at bedtime.  . phenytoin (DILANTIN) 100 MG ER capsule Take 2 capsules (200 mg) in am and 3 capsules (300 mg) in pm.  . Potassium Chloride ER 20 MEQ TBCR Take 20 mEq by mouth daily.  . sennosides-docusate sodium (SENOKOT-S) 8.6-50 MG tablet Take 1 tablet by mouth 2 (two) times daily.  . sertraline (ZOLOFT) 25 MG tablet Take 1 tablet (25 mg total) by mouth daily.  . traZODone (DESYREL) 100 MG tablet Take 1  tablet (100 mg total) by mouth at bedtime.   No current Nguyen-administered medications on file prior to visit.    Review of Systems  Constitutional: Negative for activity change, appetite change, chills, diaphoresis, fatigue and fever.  HENT: Negative for congestion and hearing loss.   Eyes: Negative for visual disturbance.  Respiratory: Negative for apnea, cough, chest tightness, shortness of breath and wheezing.   Cardiovascular: Negative for chest pain, palpitations and leg swelling.  Gastrointestinal: Negative for abdominal pain, anal bleeding, blood in stool, constipation, diarrhea, nausea and vomiting.  Endocrine: Negative for cold intolerance.  Genitourinary: Negative for difficulty urinating, dysuria,  frequency and hematuria.  Musculoskeletal: Negative for arthralgias and neck pain.  Skin: Negative for rash.  Allergic/Immunologic: Negative for environmental allergies.  Neurological: Negative for dizziness, seizures, weakness, light-headedness, numbness and headaches.  Hematological: Negative for adenopathy.  Psychiatric/Behavioral: Negative for behavioral problems, dysphoric mood and sleep disturbance. The patient is not nervous/anxious.    Per HPI unless specifically indicated above     Objective:    BP (!) 100/50   Pulse 99   Temp 98.6 F (37 C) (Oral)   Ht 5\' 9"  (1.753 m)   Wt 124 lb 12.8 oz (56.6 kg)   BMI 18.43 kg/m   Wt Readings from Last 3 Encounters:  06/03/19 124 lb 12.8 oz (56.6 kg)  02/25/19 131 lb 9.6 oz (59.7 kg)  07/22/18 131 lb 6.4 oz (59.6 kg)    Physical Exam Vitals and nursing note reviewed.  Constitutional:      General: He is not in acute distress.    Appearance: He is well-developed. He is not diaphoretic.     Comments: Well-appearing, thin appearance, comfortable, cooperative  HENT:     Head: Normocephalic and atraumatic.  Eyes:     General:        Right eye: No discharge.        Left eye: No discharge.     Conjunctiva/sclera:  Conjunctivae normal.     Pupils: Pupils are equal, round, and reactive to light.  Neck:     Thyroid: No thyromegaly.  Cardiovascular:     Rate and Rhythm: Normal rate and regular rhythm.     Heart sounds: Normal heart sounds. No murmur.  Pulmonary:     Effort: Pulmonary effort is normal. No respiratory distress.     Breath sounds: Normal breath sounds. No wheezing or rales.  Abdominal:     General: Bowel sounds are normal. There is no distension.     Palpations: Abdomen is soft. There is no mass.     Tenderness: There is no abdominal tenderness.  Musculoskeletal:        General: No tenderness. Normal range of motion.     Cervical back: Normal range of motion and neck supple.     Comments: Upper / Lower Extremities: - Normal muscle tone, strength bilateral upper extremities 5/5, lower extremities 5/5  Thin with some evidence of muscle atrophy chest and extremity  Lymphadenopathy:     Cervical: No cervical adenopathy.  Skin:    General: Skin is warm and dry.     Findings: No erythema or rash.  Neurological:     General: No focal deficit present.     Mental Status: He is alert and oriented to person, place, and time. Mental status is at baseline.     Comments: Distal sensation intact to light touch all extremities  No tremors  Psychiatric:        Behavior: Behavior normal.     Comments: Well groomed, good eye contact, reduced speech but normal thoughts, no abnormal behavior or perceptions    Results for orders placed or performed in visit on 02/25/19  Phenobarbital level  Result Value Ref Range   Phenobarbital, Serum 19.8 15.0 - 40.0 mg/L  Dilantin (Phenytoin) level, total  Result Value Ref Range   Phenytoin, Total 6.6 (L) 10.0 - 20.0 mg/L  CBC with Differential/Platelet  Result Value Ref Range   WBC 5.2 3.8 - 10.8 Thousand/uL   RBC 4.77 4.20 - 5.80 Million/uL   Hemoglobin 14.4 13.2 - 17.1 g/dL   HCT 41.4  38.5 - 50.0 %   MCV 86.8 80.0 - 100.0 fL   MCH 30.2 27.0 - 33.0  pg   MCHC 34.8 32.0 - 36.0 g/dL   RDW 13.1 11.0 - 15.0 %   Platelets 288 140 - 400 Thousand/uL   MPV 10.0 7.5 - 12.5 fL   Neutro Abs 3,052 1,500 - 7,800 cells/uL   Lymphs Abs 1,440 850 - 3,900 cells/uL   Absolute Monocytes 442 200 - 950 cells/uL   Eosinophils Absolute 198 15 - 500 cells/uL   Basophils Absolute 68 0 - 200 cells/uL   Neutrophils Relative % 58.7 %   Total Lymphocyte 27.7 %   Monocytes Relative 8.5 %   Eosinophils Relative 3.8 %   Basophils Relative 1.3 %  Comprehensive metabolic panel  Result Value Ref Range   Glucose, Bld 94 65 - 99 mg/dL   BUN 7 7 - 25 mg/dL   Creat 0.84 0.70 - 1.25 mg/dL   BUN/Creatinine Ratio NOT APPLICABLE 6 - 22 (calc)   Sodium 139 135 - 146 mmol/L   Potassium 4.0 3.5 - 5.3 mmol/L   Chloride 103 98 - 110 mmol/L   CO2 32 20 - 32 mmol/L   Calcium 9.2 8.6 - 10.3 mg/dL   Total Protein 6.6 6.1 - 8.1 g/dL   Albumin 4.0 3.6 - 5.1 g/dL   Globulin 2.6 1.9 - 3.7 g/dL (calc)   AG Ratio 1.5 1.0 - 2.5 (calc)   Total Bilirubin 0.5 0.2 - 1.2 mg/dL   Alkaline phosphatase (APISO) 137 35 - 144 U/L   AST 16 10 - 35 U/L   ALT 13 9 - 46 U/L      Assessment & Plan:   Problem List Items Addressed This Visit    Seizure disorder (HCC) - Primary Chronic problem Prior PCP managing on previous med of Dilantin, Phenobarbital Overdue for lab level check today for drug monitoring Check labs - pending result, can adjust med Advised that previous PCP recommended Neurology referral in future if difficult to manage dilantin level    Relevant Orders   Dilantin (Phenytoin) level, total   Phenobarbital level   Mild protein-calorie malnutrition (HCC) Based on BMI, has had some weight loss Reported differences in scales w/ clothing See below eating disorder    Gastroesophageal reflux disease   Eating disorder/[place] Complex history with reported purging behavior, nausea vomiting and GERD gastritis secondary Managed in past by psychiatry has had difficulty with  this eating disorder but overall has benefited from family care home and has been able to overall maintain weight - Concern with purging pills up but seem to not be reported to see pills coming back up  Check BMET  Advised them to return to Psychiat[facility] for eating disorder management Already on Wellbutrin, Sertraline Encourage therapy / follow-up    Relevant Orders   Basic Metabolic Panel (BMET)   Alcohol use disorder, severe, 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 Dilantin (Phenytoin) level, total  . Phenobarbital level  . Basic Metabolic Panel (  BMET)     Updated Health Maintenance information Encouraged improvement to lifestyle with diet and exercise Maintain weight  Signed / completed FL2 form today.  No orders of the defined types were placed in this encounter.     Follow up plan: Return in about 6 months (around 12/01/2019) for 6 months follow-up eating disorder / seizures / psych w/ new provider.  Nobie Putnam, [facility] 06/03/2019, 2:05 PM

## 2019-06-05 ENCOUNTER — Telehealth: Payer: Self-pay

## 2019-06-05 DIAGNOSIS — G319 Degenerative disease of nervous system, unspecified: Secondary | ICD-10-CM

## 2019-06-05 DIAGNOSIS — G40909 Epilepsy, unspecified, not intractable, without status epilepticus: Secondary | ICD-10-CM

## 2019-06-05 DIAGNOSIS — R9089 Other abnormal findings on diagnostic imaging of central nervous system: Secondary | ICD-10-CM

## 2019-06-05 LAB — BASIC METABOLIC PANEL
BUN: 7 mg/dL (ref 7–25)
CO2: 23 mmol/L (ref 20–32)
Calcium: 9.2 mg/dL (ref 8.6–10.3)
Chloride: 104 mmol/L (ref 98–110)
Creat: 0.83 mg/dL (ref 0.70–1.25)
Glucose, Bld: 76 mg/dL (ref 65–99)
Potassium: 4.4 mmol/L (ref 3.5–5.3)
Sodium: 138 mmol/L (ref 135–146)

## 2019-06-05 LAB — PHENOBARBITAL LEVEL: Phenobarbital, Serum: 23.2 mg/L (ref 15.0–40.0)

## 2019-06-05 LAB — PHENYTOIN LEVEL, TOTAL: Phenytoin, Total: <2.5 mg/L — ABNORMAL LOW (ref 10.0–20.0)

## 2019-06-05 NOTE — Telephone Encounter (Signed)
-----   Message from Olin Hauser, DO sent at 06/05/2019  9:54 AM EST ----- Please contact patient / caregiver Junious Dresser) to review the following (No MyChart Access):  1. Chemistry - Normal results, including electrolytes, kidney and liver function. Normal fasting blood sugar   2. Phenytoin (Dilantin) level - very low level, medication may not be as effective at this dose.  3. Phenobarbital - Normal level. Current dose looks good.  I spoke with our clinical pharmacist Harlow Asa [facility] to discuss the dosing of Dilantin (Phenytoin) and she recommended a Neurology referral to re-evaluate his seizure medication and determine appropriate dosage.  Lauren had recommended this as well at her last appointment.  Once I hear back with an okay - I will place referral to [facility] - Neurology and they will call with appointment. Let me know.  Nobie Putnam, [facility] 06/05/2019, 9:53 AM

## 2019-06-05 NOTE — Telephone Encounter (Signed)
Orders Placed This Encounter  Procedures  . Ambulatory referral to Neurology    Referral Priority:   Routine    Referral Type:   Consultation    Referral Reason:   Specialty Services Required    Requested Specialty:   Neurology    Number of Visits Requested:   1   Referral to [facility] Neurology placed  Nobie Putnam, [facility] 06/05/2019, 10:55 AM

## 2019-06-05 NOTE — Telephone Encounter (Signed)
Jeremiah Nguyen would like to proceed with the Neurology referral. She said Dr. Vicente Males Sherrill Raring) recommended a neurology referral in the past after she received the results from a recent brain scan.

## 2019-07-13 ENCOUNTER — Other Ambulatory Visit: Payer: Self-pay | Admitting: Nurse Practitioner

## 2019-07-13 DIAGNOSIS — G40909 Epilepsy, unspecified, not intractable, without status epilepticus: Secondary | ICD-10-CM

## 2019-07-13 MED ORDER — PHENOBARBITAL 60 MG PO TABS: 60.0000 mg | ORAL_TABLET | Freq: Every day | ORAL | 0 refills | Status: DC

## 2019-07-13 NOTE — Telephone Encounter (Signed)
Jeremiah Nguyen called requesting refill on phenobarbital 60 mg

## 2019-07-14 MED ORDER — PHENOBARBITAL 60 MG PO TABS: 60.0000 mg | ORAL_TABLET | Freq: Every day | ORAL | 0 refills | Status: DC

## 2019-07-14 NOTE — Addendum Note (Signed)
Addended by: Verl Bangs on: 07/14/2019 10:21 AM   Modules accepted: Orders

## 2019-07-22 DIAGNOSIS — F509 Eating disorder, unspecified: Secondary | ICD-10-CM | POA: Insufficient documentation

## 2019-07-27 DIAGNOSIS — R9089 Other abnormal findings on diagnostic imaging of central nervous system: Secondary | ICD-10-CM | POA: Insufficient documentation

## 2019-09-07 IMAGING — MR MR HEAD WO/W CM
11 of 12 series · 44 of 48 positions shown · IV contrast (gadavist)
Comparison: MRI of the head 12/18/2007

CLINICAL DATA: Hiccups.  Seizures.

EXAM:
MRI HEAD WITHOUT AND WITH CONTRAST
TECHNIQUE: Multiplanar, multiecho pulse sequences of the brain and surrounding
structures were obtained without and with intravenous contrast.
CONTRAST:  6 mL Gadavist

[Series 2: ax dwi_tracew · axial · 3.0mm · 0.83mm/px · z∈[-19,+143]mm · 6 of 55 slices shown]
[im 1/55]
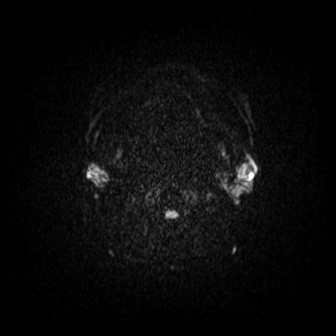
[im 11/55]
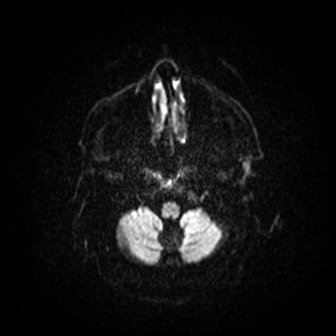
[im 22/55]
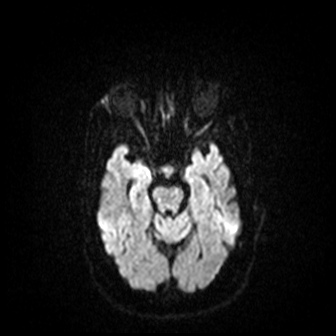
[im 33/55]
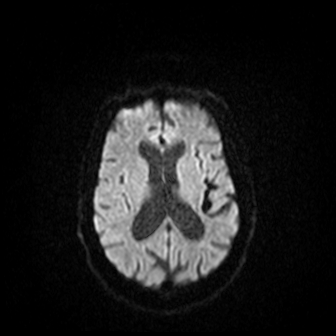
[im 44/55]
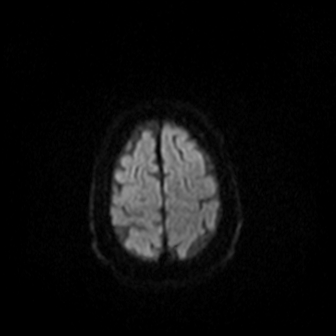
[im 55/55]
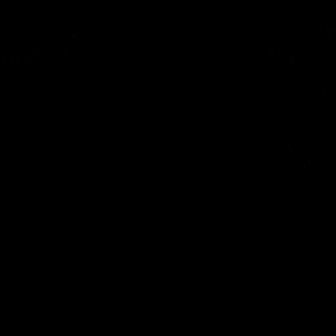

[Series 3: ax dwi_adc · axial · 3.0mm · 0.83mm/px · z∈[-19,+140]mm · 7 of 54 slices shown]
[im 1/54]
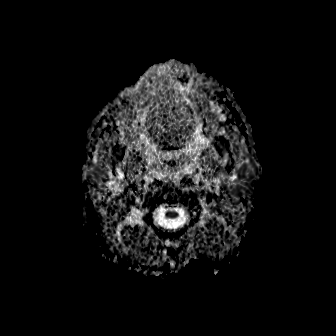
[im 9/54]
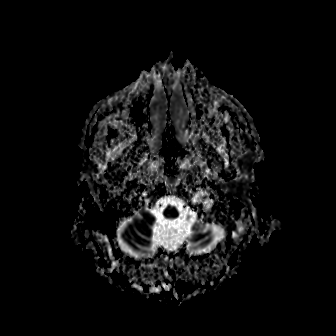
[im 18/54]
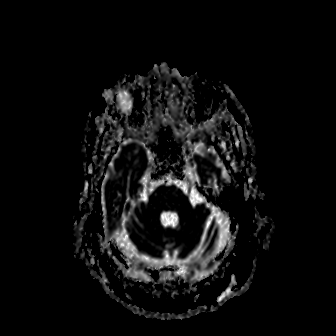
[im 27/54]
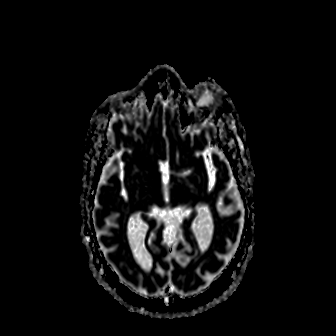
[im 36/54]
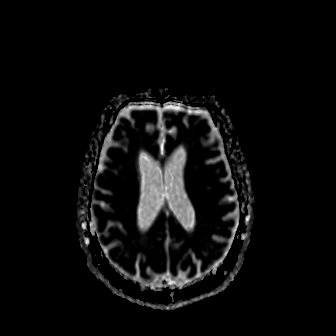
[im 45/54]
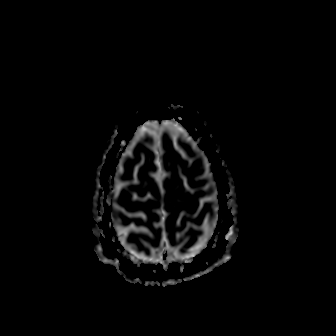
[im 54/54]
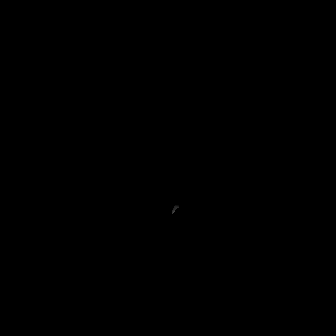

[Series 4: cor dwi_tracew · coronal · 5.0mm · 0.68mm/px · 5 of 37 slices shown]
[im 1/37]
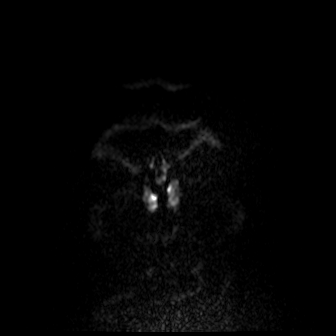
[im 10/37]
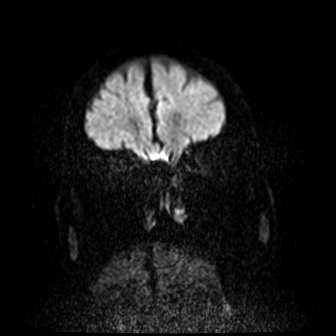
[im 19/37]
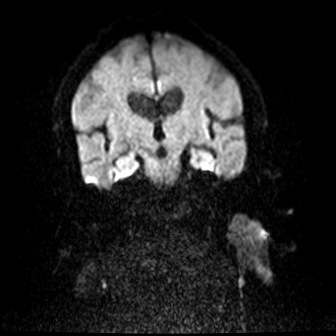
[im 28/37]
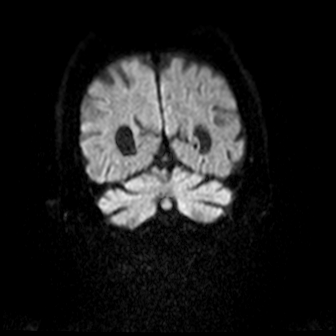
[im 37/37]
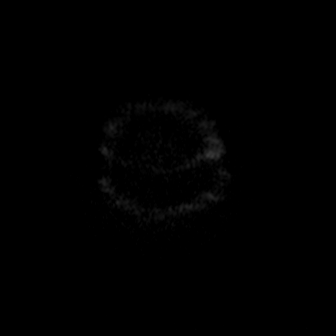

[Series 5: cor dwi_adc · coronal · 5.0mm · 0.68mm/px · 4 of 37 slices shown]
[im 1/37]
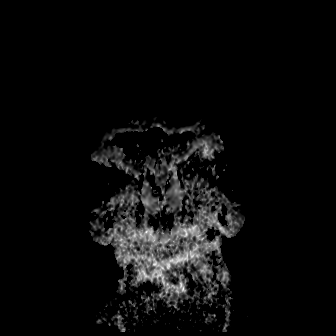
[im 10/37]
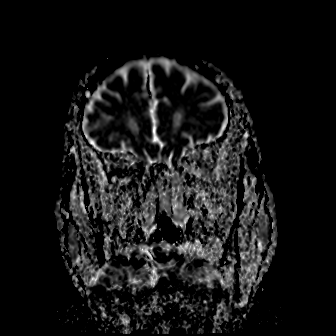
[im 19/37]
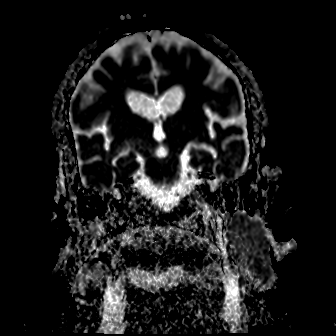
[im 28/37]
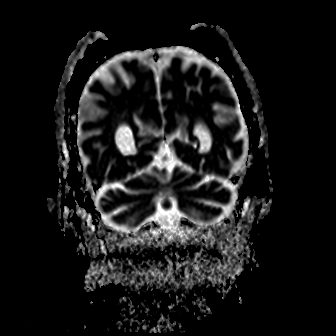

[Series 6: T1 · sagittal · 5.0mm · 0.94mm/px · 2 of 20 slices shown (1 of 2)]
[im 1/20]
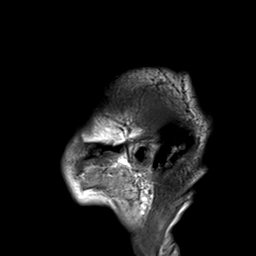
[im 20/20]
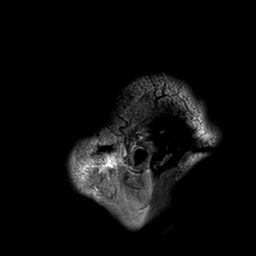

[Series 7: FLAIR · axial · 5.0mm · 1.20mm/px · z∈[-9,+135]mm · 3 of 25 slices shown]
[im 1/25]
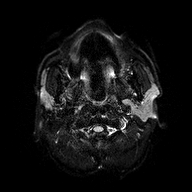
[im 13/25]
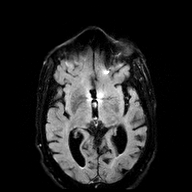
[im 25/25]
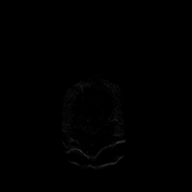

[Series 9: T2 · axial · 5.0mm · 0.45mm/px · z∈[-8,+136]mm · 3 of 25 slices shown (1 of 2)]
[im 1/25]
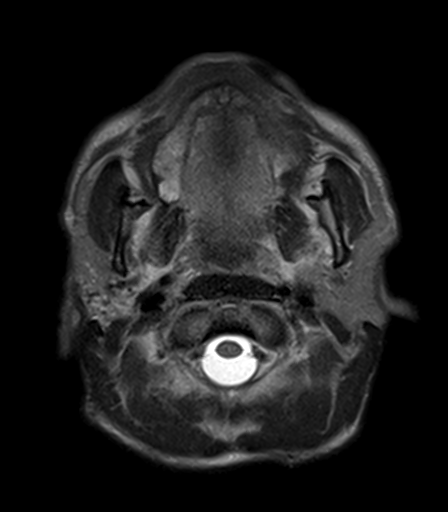
[im 13/25]
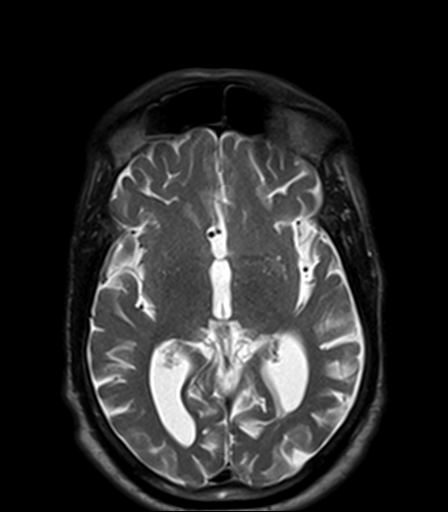
[im 25/25]
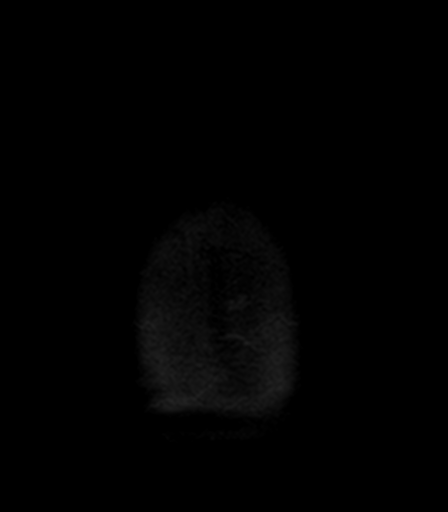

[Series 10: T1 · axial · 5.0mm · 0.90mm/px · z∈[-8,+136]mm · 3 of 25 slices shown (2 of 2)]
[im 1/25]
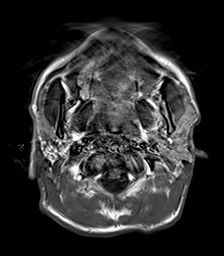
[im 13/25]
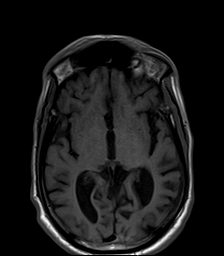
[im 25/25]
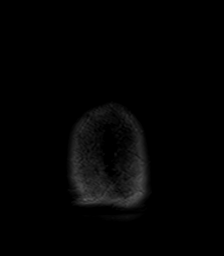

[Series 11: T2 · coronal · 5.0mm · 0.45mm/px · 4 of 29 slices shown (2 of 2)]
[im 1/29]
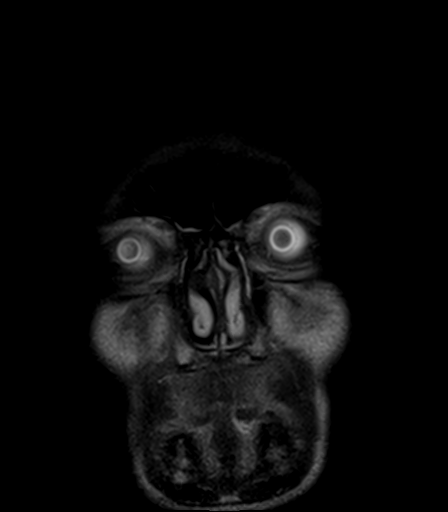
[im 10/29]
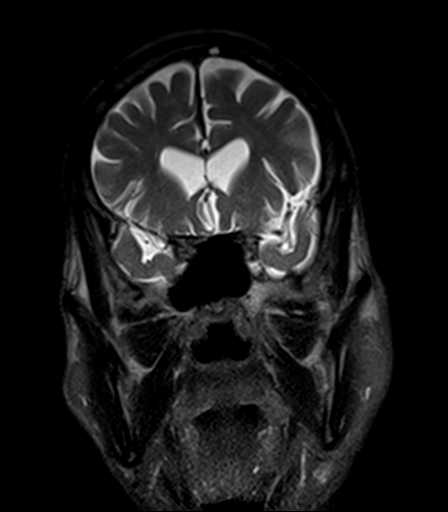
[im 19/29]
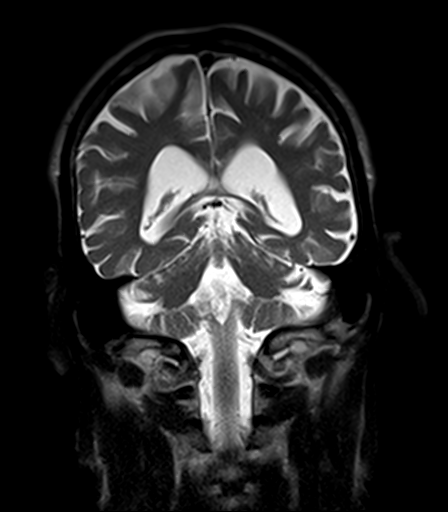
[im 29/29]
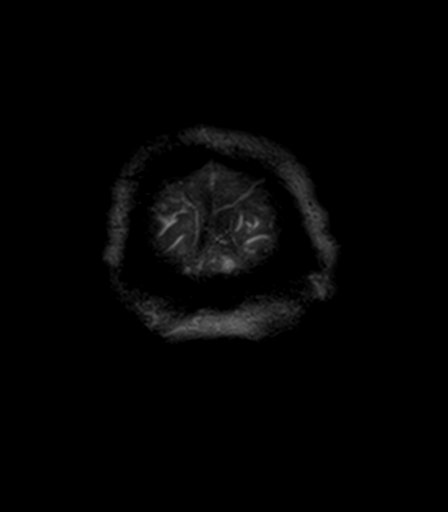

[Series 12: T1 post-contrast · axial · 5.0mm · 0.90mm/px · z∈[-8,+136]mm · 3 of 25 slices shown (1 of 2)]
[im 1/25]
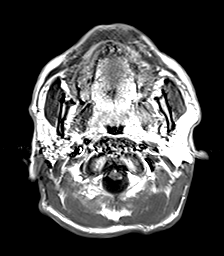
[im 13/25]
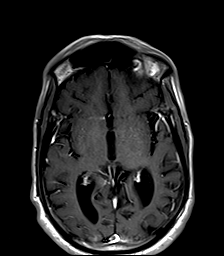
[im 25/25]
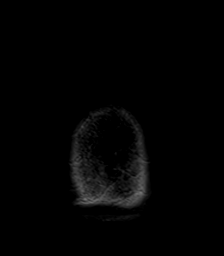

[Series 13: T1 post-contrast · coronal · 5.0mm · 0.90mm/px · 4 of 29 slices shown (2 of 2)]
[im 1/29]
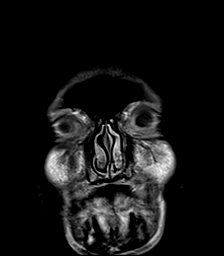
[im 10/29]
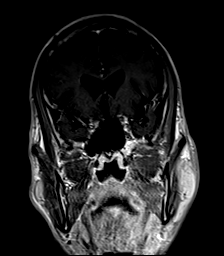
[im 19/29]
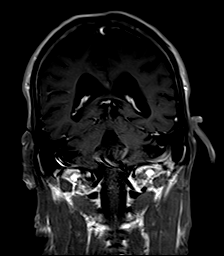
[im 29/29]
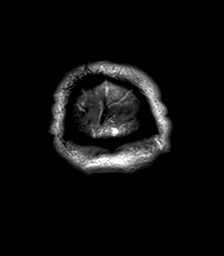

[44 of 48 positions shown; findings below may reference images not displayed]

FINDINGS: Brain: Atrophy and white matter changes are mildly advanced for age.
There is some progression since the prior exam. No acute infarct,
hemorrhage, or mass lesion is present. Cerebellar atrophy is
somewhat disproportionate.

Postcontrast images demonstrate no pathologic enhancement.
Ossification along the anterior falx is again noted.

Vascular: Flow is present in the major intracranial arteries.

Skull and upper cervical spine: Skull base is within normal limits.
The cervical junction is normal. Mild degenerative changes are
present at C2-3 and C3-4.

Sinuses/Orbits: Mild mucosal thickening is present ethmoid air cells
in the inferior maxillary sinuses bilaterally. No significant fluid
levels are present. Nasal cavity is clear. Mastoid air cells are
clear. Globes and orbits are within normal limits.
IMPRESSION: 1. Interval progression atrophy and white matter disease, mildly
advanced for age. This likely reflects the sequela of chronic
microvascular ischemia.
2. Disproportionate cerebellar atrophy is likely related to chronic
anti seizure therapy.
3. Acute or focal lesion to explain seizures or chronic hiccups.

## 2019-09-25 ENCOUNTER — Other Ambulatory Visit: Payer: Self-pay | Admitting: Family Medicine

## 2019-10-08 ENCOUNTER — Emergency Department
Admission: EM | Admit: 2019-10-08 | Discharge: 2019-10-08 | Disposition: A | Discharge status: Home / Self Care | Payer: Medicaid Other | Attending: Emergency Medicine | Admitting: Emergency Medicine

## 2019-10-08 ENCOUNTER — Emergency Department: Payer: Medicaid Other

## 2019-10-08 ENCOUNTER — Encounter: Payer: Self-pay | Admitting: Emergency Medicine

## 2019-10-08 DIAGNOSIS — J449 Chronic obstructive pulmonary disease, unspecified: Secondary | ICD-10-CM | POA: Diagnosis not present

## 2019-10-08 DIAGNOSIS — W010XXA Fall on same level from slipping, tripping and stumbling without subsequent striking against object, initial encounter: Secondary | ICD-10-CM | POA: Insufficient documentation

## 2019-10-08 DIAGNOSIS — W19XXXA Unspecified fall, initial encounter: Secondary | ICD-10-CM

## 2019-10-08 DIAGNOSIS — R102 Pelvic and perineal pain: Secondary | ICD-10-CM | POA: Diagnosis not present

## 2019-10-08 DIAGNOSIS — Z79899 Other long term (current) drug therapy: Secondary | ICD-10-CM | POA: Insufficient documentation

## 2019-10-08 DIAGNOSIS — F17210 Nicotine dependence, cigarettes, uncomplicated: Secondary | ICD-10-CM | POA: Insufficient documentation

## 2019-10-08 NOTE — ED Provider Notes (Signed)
[facility] Emergency Department Provider Note  Time seen: 5:36 AM  I have reviewed the triage vital signs and the nursing notes.   HISTORY  Chief Complaint Fall   HPI Jeremiah Nguyen is a 64 y.o. male with a past medical history of COPD, gastric reflux, presents to the emergency department for a fall.  According to report patient was found by police department on [address].  Patient is from [place] from a group home where they filed a missing person report yesterday around 530.  Patient states he admits that he did not tell anyone at the group home that he was leaving.  His only complaint right now he states mild buttock and pelvis pain from falling.  States he tripped on his shoe.  Largely negative review of systems.  Overall appears quite well.  Denies any drugs or alcohol.   Past Medical History:  Diagnosis Date  . COPD (chronic obstructive pulmonary disease) ([place])   . Eating disorder   . GERD (gastroesophageal reflux disease)   . Liver disease   . Seizures ([place])   . Sleep apnea     Patient Active Problem List   Diagnosis Date Noted  . Mild protein-calorie malnutrition ([place]) 06/03/2019  . Alcohol use disorder, severe, in sustained remission ([place]) 06/03/2019  . Person living in residential institution 06/02/2018  . Eating disorder   . Liver disease, chronic 01/07/2018  . Seizure disorder ([place]) 06/10/2017  . Gastroesophageal reflux disease 06/10/2017  . Sepsis ([place]) 11/25/2014  . Tobacco abuse 11/25/2014    Past Surgical History:  Procedure Laterality Date  . ESOPHAGOGASTRODUODENOSCOPY (EGD) WITH PROPOFOL N/A 02/25/2018   Procedure: ESOPHAGOGASTRODUODENOSCOPY (EGD) WITH PROPOFOL;  Surgeon: [name], MD;  Location: [facility] ENDOSCOPY;  Service: Gastroenterology;  Laterality: N/A;  . LESION EXCISION Left [id number]   follicular cyst  . SKIN BIOPSY      Prior to Admission medications   Medication Sig Start Date End Date Taking? Authorizing Provider   acetaminophen (TYLENOL) 325 MG tablet Take 1 tablet (325 mg total) by mouth every 4 (four) hours as needed. 09/02/18   Mikey College, NP  baclofen (LIORESAL) 10 MG tablet Take 1 tablet (10 mg total) by mouth 4 (four) times daily. 09/02/18   Mikey College, NP  buPROPion (WELLBUTRIN) 75 MG tablet Take 1 tablet (75 mg total) by mouth daily. 07/01/18   Ursula Alert, MD  chlorproMAZINE (THORAZINE) 50 MG tablet Take 1 tablet (50 mg total) by mouth 4 (four) times daily. 09/02/18   Mikey College, NP  mirtazapine (REMERON) 15 MG tablet Take 1 tablet (15 mg total) by mouth at bedtime. 05/19/19   Karamalegos, Devonne Doughty, DO  omeprazole (PRILOSEC) 40 MG capsule Take 1 capsule (40 mg total) by mouth daily. 02/11/18   [name], MD  PHENObarbital (LUMINAL) 60 MG tablet Take 1 tablet (60 mg total) by mouth at bedtime. 07/14/19   Malfi, Lupita Raider, FNP  phenytoin (DILANTIN) 100 MG ER capsule Take 2 capsules (200 mg) in am and 3 capsules (300 mg) in pm. 02/26/19   Mikey College, NP  Potassium Chloride ER 20 MEQ TBCR Take 20 mEq by mouth daily. 09/02/18   Mikey College, NP  SENEXON-S 8.6-50 MG tablet TAKE ONE TABLET BY MOUTH TWO TIMES A DAY 09/25/19   Karamalegos, Devonne Doughty, DO  sertraline (ZOLOFT) 25 MG tablet Take 1 tablet (25 mg total) by mouth daily. 09/02/18   Mikey College, NP  traZODone (DESYREL) 100  MG tablet Take 1 tablet (100 mg total) by mouth at bedtime. 09/02/18   Mikey College, NP    No Known Allergies  Family History  Problem Relation Age of Onset  . Cancer Mother        breast    Social History Social History   Tobacco Use  . Smoking status: Current Every Day Smoker    Packs/day: 0.75    Years: 30.00    Pack years: 22.50  . Smokeless tobacco: Never Used  Substance Use Topics  . Alcohol use: No  . Drug use: No    Review of Systems Constitutional: Negative for fever. Cardiovascular: Negative for chest pain. Respiratory: Negative  for shortness of breath. Gastrointestinal: Negative for abdominal pain Musculoskeletal: Mild buttock pain Neurological: Negative for headache All other ROS negative  ____________________________________________   PHYSICAL EXAM:  VITAL SIGNS: ED Triage Vitals  Enc Vitals Group     BP      Pulse      Resp      Temp      Temp src      SpO2      Weight      Height      Head Circumference      Peak Flow      Pain Score      Pain Loc      Pain Edu?      Excl. in [place]?     Constitutional: Alert and oriented. Well appearing and in no distress. Eyes: Normal exam ENT      Head: Normocephalic and atraumatic.      Mouth/Throat: Mucous membranes are moist. Cardiovascular: Normal rate, regular rhythm.  Respiratory: Normal respiratory effort without tachypnea nor retractions. Breath sounds are clear  Gastrointestinal: Soft and nontender. No distention Musculoskeletal: Nontender with normal range of motion in all extremities. No lower extremity tenderness or edema. Neurologic:  Normal speech and language. No gross focal neurologic deficits  Skin:  Skin is warm, dry and intact.  Psychiatric: Mood and affect are normal.  ____________________________________________   RADIOLOGY  X-ray negative  ____________________________________________   INITIAL IMPRESSION / ASSESSMENT AND PLAN / ED COURSE  Pertinent labs & imaging results that were available during my care of the patient were reviewed by me and considered in my medical decision making (see chart for details).   Patient presents to the emergency department for medical evaluation.  Patient was found by police at [employer] walking around.  Patient found to be a missing person from a [place] group home as of yesterday.  Patient states he has just been walking around all night but had become tired.  Overall the patient appears well, no distress, no significant complaint besides mild buttock pain.  We will obtain an x-ray of the pelvis as  a precaution.  Otherwise we will discussed with the patient's group home to try to arrange for transport back to the patient's living facility.  X-rays negative.  We will discharge back to his group home.  ROGERIO ANGLEY was evaluated in Emergency Department on 10/08/2019 for the symptoms described in the history of present illness. He was evaluated in the context of the global COVID-19 pandemic, which necessitated consideration that the patient might be at risk for infection with the SARS-CoV-2 virus that causes COVID-19. Institutional protocols and algorithms that pertain to the evaluation of patients at risk for COVID-19 are in a state of rapid change based on information released by regulatory bodies including the CDC  and federal and state organizations. These policies and algorithms were followed during the patient's care in the ED.  ____________________________________________   FINAL CLINICAL IMPRESSION(S) / ED DIAGNOSES  Cheral Marker, MD 10/08/19 [id number]

## 2019-10-08 NOTE — ED Notes (Signed)
E-signature not working at this time. Pt group home caregiver verbalized understanding of D/C instructions, prescriptions and follow up care with no further questions at this time. Pt in NAD and ambulatory at time of D/C. Pt left with staff member at [facility] where patient resides.

## 2019-10-08 NOTE — ED Triage Notes (Signed)
Pt arrived via EMS from [facility] where pt was found by [facility]. Pt is listed as missing person from [place] group home since 1730 on 5/19. Pt reports he has been walking since and this AM he became tired and fell onto his buttocks. Pt c/o pain in buttocks area.

## 2019-10-28 ENCOUNTER — Other Ambulatory Visit: Payer: Self-pay | Admitting: Family Medicine

## 2019-10-28 DIAGNOSIS — F509 Eating disorder, unspecified: Secondary | ICD-10-CM

## 2019-10-28 DIAGNOSIS — G40909 Epilepsy, unspecified, not intractable, without status epilepticus: Secondary | ICD-10-CM

## 2019-10-28 DIAGNOSIS — F419 Anxiety disorder, unspecified: Secondary | ICD-10-CM

## 2019-10-28 NOTE — Telephone Encounter (Signed)
Requested medication (s) are due for refill today: yes  Requested medication (s) are on the active medication list: yes   Last refill: 02/26/2019    #150   2 refills  Future visit scheduled no  Notes to clinic Not delegated  Requested Prescriptions  Pending Prescriptions Disp Refills   phenytoin (DILANTIN) 100 MG ER capsule [Pharmacy Med Name: Phenytoin Sodium Extended 100 MG Capsule] 147 capsule 10    Sig: TAKE 2 CAPSULES BY MOUTH EVERY MORNING *DO NOT CRUSH OR CHEW* *HAZARDOUS DRUG: WEAR GLOVES*;TAKE 3 CAPSULES BY MOUTH EVERY EVENING *DO NOT CRUSH OR CHEW* *HAZARDOUS DRUG: [place]*      Not Delegated - Neurology:  Anticonvulsants - phenytoin Failed - 10/28/2019  2:29 PM      Failed - This refill cannot be delegated      Failed - Phenytoin (serum) in normal range and within 360 days    Phenytoin, Total  Date Value Ref Range Status  06/03/2019 <2.5 (L) 10.0 - 20.0 mg/L Final    Comment:    Verified by repeat analysis. .           Passed - ALT in normal range and within 360 days    ALT  Date Value Ref Range Status  02/25/2019 13 9 - 46 U/L Final          Passed - AST in normal range and within 360 days    AST  Date Value Ref Range Status  02/25/2019 16 10 - 35 U/L Final          Passed - HGB in normal range and within 360 days    Hemoglobin  Date Value Ref Range Status  02/25/2019 14.4 13.2 - 17.1 g/dL Final   HGB  Date Value Ref Range Status  12/08/2013 14.3 13.0 - 18.0 g/dL Final          Passed - HCT in normal range and within 360 days    HCT  Date Value Ref Range Status  02/25/2019 41.4 38.5 - 50.0 % Final  12/08/2013 44.5 40.0 - 52.0 % Final          Passed - PLT in normal range and within 360 days    Platelets  Date Value Ref Range Status  02/25/2019 288 140 - 400 Thousand/uL Final   Platelet  Date Value Ref Range Status  12/08/2013 237 150 - 440 x10 3/mm 3 Final          Passed - WBC in normal range and within 360 days    WBC  Date  Value Ref Range Status  02/25/2019 5.2 3.8 - 10.8 Thousand/uL Final          Passed - Valid encounter within last 12 months    Recent Outpatient Visits           4 months ago Seizure disorder [facility])   [place], DO   8 months ago Gastroesophageal reflux disease, unspecified whether esophagitis present   [facility] Merrilyn Puma, Jerrel Ivory, NP   1 year ago Low TSH level   [facility] Merrilyn Puma, Jerrel Ivory, NP   1 year ago High risk medication use   [facility] Merrilyn Puma, Jerrel Ivory, NP   1 year ago Unintended weight loss   [facility] Merrilyn Puma, Jerrel Ivory, NP

## 2019-11-09 DIAGNOSIS — R569 Unspecified convulsions: Secondary | ICD-10-CM | POA: Insufficient documentation

## 2019-11-12 DIAGNOSIS — Z79899 Other long term (current) drug therapy: Secondary | ICD-10-CM | POA: Insufficient documentation

## 2019-11-26 ENCOUNTER — Encounter: Payer: Self-pay | Admitting: Emergency Medicine

## 2019-11-26 ENCOUNTER — Emergency Department: Payer: Medicare Other

## 2019-11-26 ENCOUNTER — Emergency Department
Admission: EM | Admit: 2019-11-26 | Discharge: 2019-11-26 | Disposition: A | Discharge status: Home / Self Care | Payer: Medicare Other | Attending: Emergency Medicine | Admitting: Emergency Medicine

## 2019-11-26 ENCOUNTER — Other Ambulatory Visit: Payer: Self-pay

## 2019-11-26 DIAGNOSIS — I6782 Cerebral ischemia: Secondary | ICD-10-CM | POA: Insufficient documentation

## 2019-11-26 DIAGNOSIS — R262 Difficulty in walking, not elsewhere classified: Secondary | ICD-10-CM | POA: Insufficient documentation

## 2019-11-26 DIAGNOSIS — J449 Chronic obstructive pulmonary disease, unspecified: Secondary | ICD-10-CM | POA: Diagnosis not present

## 2019-11-26 DIAGNOSIS — R531 Weakness: Secondary | ICD-10-CM | POA: Diagnosis present

## 2019-11-26 LAB — BASIC METABOLIC PANEL
Anion gap: 8 (ref 5–15)
BUN: 7 mg/dL — ABNORMAL LOW (ref 8–23)
CO2: 29 mmol/L (ref 22–32)
Calcium: 8.9 mg/dL (ref 8.9–10.3)
Chloride: 101 mmol/L (ref 98–111)
Creatinine, Ser: 0.82 mg/dL (ref 0.61–1.24)
GFR calc Af Amer: >60 mL/min (ref >60)
GFR calc non Af Amer: >60 mL/min (ref >60)
Glucose, Bld: 94 mg/dL (ref 70–99)
Potassium: 4.4 mmol/L (ref 3.5–5.1)
Sodium: 138 mmol/L (ref 135–145)

## 2019-11-26 LAB — CBC
HCT: 40.2 % (ref 39.0–52.0)
Hemoglobin: 13.9 g/dL (ref 13.0–17.0)
MCH: 29.7 pg (ref 26.0–34.0)
MCHC: 34.6 g/dL (ref 30.0–36.0)
MCV: 85.9 fL (ref 80.0–100.0)
Platelets: 248 10*3/uL (ref 150–400)
RBC: 4.68 MIL/uL (ref 4.22–5.81)
RDW: 14.6 % (ref 11.5–15.5)
WBC: 6.6 10*3/uL (ref 4.0–10.5)
nRBC: 0 % (ref 0.0–0.2)

## 2019-11-26 LAB — URINALYSIS, COMPLETE (UACMP) WITH MICROSCOPIC
Bacteria, UA: NONE SEEN
Bilirubin Urine: NEGATIVE
Glucose, UA: NEGATIVE mg/dL
Hgb urine dipstick: NEGATIVE
Ketones, ur: NEGATIVE mg/dL
Leukocytes,Ua: NEGATIVE
Nitrite: NEGATIVE
Protein, ur: NEGATIVE mg/dL
Specific Gravity, Urine: 1.011 (ref 1.005–1.030)
pH: 7 (ref 5.0–8.0)

## 2019-11-26 LAB — HEPATIC FUNCTION PANEL
ALT: 12 U/L (ref 0–44)
AST: 19 U/L (ref 15–41)
Albumin: 3.4 g/dL — ABNORMAL LOW (ref 3.5–5.0)
Alkaline Phosphatase: 134 U/L — ABNORMAL HIGH (ref 38–126)
Bilirubin, Direct: 0.2 mg/dL (ref 0.0–0.2)
Indirect Bilirubin: 0.5 mg/dL (ref 0.3–0.9)
Total Bilirubin: 0.7 mg/dL (ref 0.3–1.2)
Total Protein: 6.6 g/dL (ref 6.5–8.1)

## 2019-11-26 LAB — TROPONIN I (HIGH SENSITIVITY): Troponin I (High Sensitivity): 18 ng/L — ABNORMAL HIGH (ref <18)

## 2019-11-26 MED ORDER — ONDANSETRON 4 MG PO TBDP
4.0000 mg | ORAL_TABLET | Freq: Once | ORAL | Status: AC
  Administered 2019-11-26: 4 mg via ORAL
  Filled 2019-11-26: qty 1

## 2019-11-26 MED ORDER — ONDANSETRON HCL 4 MG/2ML IJ SOLN: 4.0000 mg | Freq: Once | INTRAMUSCULAR | Status: DC

## 2019-11-26 MED ORDER — SODIUM CHLORIDE 0.9% FLUSH: 3.0000 mL | Freq: Once | INTRAVENOUS | Status: DC

## 2019-11-26 NOTE — ED Notes (Addendum)
This rn attempted 2X PIv w/o success. This Rn requesting assistance from EDT Caitlyn at this time to draw blood with a straight stick.Marland Kitchen

## 2019-11-26 NOTE — ED Notes (Signed)
This Rn spoke with Reynold Bowen (facility administrator) st, pt's losing balance when walking". Denies hx of vertigo, pt c/o dizziness for a couple of days.

## 2019-11-26 NOTE — ED Provider Notes (Signed)
ER Provider Note       Time seen: 3:09 PM    I have reviewed the vital signs and the nursing notes.  HISTORY   Chief Complaint Weakness   HPI Jeremiah Nguyen is a 64 y.o. male with a history of COPD, GERD, seizures, liver disorder, sleep apnea who presents today for generalized weakness for the last day.  Patient states he been feeling weak for several weeks, states he cannot walk.  When he stands up his legs give way.  He denies any recent illness or other complaints.  Past Medical History:  Diagnosis Date   COPD (chronic obstructive pulmonary disease) (H[facility])    Eating disorder    GERD (gastroesophageal reflux disease)    Liver disease    Seizures ([place])    Sleep apnea     Past Surgical History:  Procedure Laterality Date   ESOPHAGOGASTRODUODENOSCOPY (EGD) WITH PROPOFOL N/A 02/25/2018   Procedure: ESOPHAGOGASTRODUODENOSCOPY (EGD) WITH PROPOFOL;  Surgeon: [name], MD;  Location: [facility] ENDOSCOPY;  Service: Gastroenterology;  Laterality: N/A;   LESION EXCISION Left 07-03-06   follicular cyst   SKIN BIOPSY      Allergies Patient has no known allergies.  Review of Systems Constitutional: Negative for fever. Cardiovascular: Negative for chest pain. Respiratory: Negative for shortness of breath. Gastrointestinal: Negative for abdominal pain, vomiting and diarrhea. Musculoskeletal: Negative for back pain. Skin: Negative for rash. Neurological: Positive for leg weakness  All systems negative/normal/unremarkable except as stated in the HPI  ____________________________________________   PHYSICAL EXAM:  VITAL SIGNS: Vitals:   11/26/19 1147  BP: 131/66  Pulse: 83  Resp: 16  Temp: 98.3 F (36.8 C)  SpO2: 97%    Constitutional:  Well appearing and in no distress. Eyes: Conjunctivae are normal. Normal extraocular movements. ENT      Head: Normocephalic and atraumatic.      Nose: No congestion/rhinnorhea.      Mouth/Throat: Mucous membranes are  moist.      Neck: No stridor. Cardiovascular: Normal rate, regular rhythm. No murmurs, rubs, or gallops. Respiratory: Normal respiratory effort without tachypnea nor retractions. Breath sounds are clear and equal bilaterally. No wheezes/rales/rhonchi. Gastrointestinal: Soft and nontender. Normal bowel sounds Musculoskeletal: Nontender with normal range of motion in extremities. No lower extremity tenderness nor edema. Neurologic:  Normal speech and language. No gross focal neurologic deficits are appreciated.  No obvious leg weakness is noted. Skin:  Skin is warm, dry and intact. No rash noted. Psychiatric: Speech and behavior are normal.  ____________________________________________  EKG: Interpreted by me.  Sinus rhythm with rate of 80 bpm, left anterior fascicular block, normal QT  Repeat EKG interpreted by me, sinus rhythm with rate of 83 bpm, possible septal infarct age-indeterminate, normal QT ____________________________________________   LABS (pertinent positives/negatives)  Labs Reviewed  BASIC METABOLIC PANEL - Abnormal; Notable for the following components:      Result Value   BUN 7 (*)    All other components within normal limits  CBC  URINALYSIS, COMPLETE (UACMP) WITH MICROSCOPIC  HEPATIC FUNCTION PANEL  CBG MONITORING, ED  TROPONIN I (HIGH SENSITIVITY)    RADIOLOGY  Images were viewed by me CT head, chest x-ray, MRI/MRA IMPRESSION: 1. No acute intracranial abnormality. 2. Normal intracranial MRA. 3. Mild white matter disease, most commonly due to chronic ischemic microangiopathy.   DIFFERENTIAL DIAGNOSIS  Dehydration, electrolyte abnormality, CVA, TIA, MI, ataxia  ASSESSMENT AND PLAN  Weakness, difficulty walking   Plan: The patient had presented for weakness and difficulty walking.  Patient's labs did not reveal any acute process.  Initially was concerned he may have had a stroke because he has difficulty walking although CT was negative, MRI was also  reassuring.  MRI was negative.  I will try to arrange for home physical therapy for him.  He is cleared for outpatient follow-up.  Lenise Arena MD    Note: This note was generated in part or whole with voice recognition software. Voice recognition is usually quite accurate but there are transcription errors that can and very often do occur. I apologize for any typographical errors that were not detected and corrected.    Earleen Newport, MD 11/26/19 2120

## 2019-11-26 NOTE — ED Triage Notes (Signed)
Pt in via EMS from [facility] home with c/o dizziness for the last couple of days. Denies NVD. Facility caretaker wanted him evaluated. No hx of vertigo.   Contact info:  Reynold Bowen [phone]

## 2019-11-26 NOTE — ED Triage Notes (Addendum)
C/O generalized weakness x 1 day.    AAOx3.  Skin warm and dry. MAE equally and strong.  NAD  During triage patient states that he has been feeling weak for about 2-3 weeks.

## 2019-11-26 NOTE — ED Notes (Signed)
Lab at bedside

## 2019-11-26 NOTE — ED Notes (Signed)
Pt provided with phone to speak with MRI tech.

## 2019-11-26 NOTE — ED Notes (Signed)
This RN spoke with Reynold Bowen [phone] to provide a status update.

## 2019-11-26 NOTE — ED Notes (Signed)
EDT Caitlin unable to obtain blood t this time. This RN called lab for blood draw.

## 2019-11-26 NOTE — ED Notes (Signed)
Pt to CT

## 2019-11-26 NOTE — ED Notes (Signed)
Pt taken to toilet to void with this Rn assistance. Pt able to stand with minimal assistance. Pt denies dizziness at this time. Pt placed back on stretcher. NAD noted at this time.

## 2019-12-17 ENCOUNTER — Telehealth: Payer: Self-pay

## 2019-12-17 ENCOUNTER — Other Ambulatory Visit: Payer: Self-pay | Admitting: Nurse Practitioner

## 2019-12-17 ENCOUNTER — Other Ambulatory Visit: Payer: Self-pay | Admitting: Psychiatry

## 2019-12-17 ENCOUNTER — Other Ambulatory Visit: Payer: Self-pay | Admitting: Family Medicine

## 2019-12-17 DIAGNOSIS — F509 Eating disorder, unspecified: Secondary | ICD-10-CM

## 2019-12-17 DIAGNOSIS — F419 Anxiety disorder, unspecified: Secondary | ICD-10-CM

## 2019-12-17 DIAGNOSIS — R066 Hiccough: Secondary | ICD-10-CM

## 2019-12-17 NOTE — Telephone Encounter (Signed)
Requested medication (s) are due for refill today:  yes  Requested medication (s) are on the active medication list:  Yes  Future visit scheduled:  no  Last Refill: Baclofen 09/02/18; #120; RF x 5                   Chlorpromazine 09/02/18; #120; RF x 5  Note to Clinic: Medications are not delegated.  Attempted to call pt. at the [facility] where he resides.  Was advised for office to call back 7/31, 9:00-10:00 AM, and ask for Administrator, Junious Dresser, to schedule 6 mo. F/u appt.    Requested Prescriptions  Pending Prescriptions Disp Refills   baclofen (LIORESAL) 10 MG tablet [Pharmacy Med Name: Baclofen 10 MG Tablet] 120 each 10    Sig: TAKE ONE TABLET BY MOUTH FOUR TIMES A DAY      Not Delegated - Analgesics:  Muscle Relaxants Failed - 12/17/2019 12:00 PM      Failed - This refill cannot be delegated      Failed - Valid encounter within last 6 months    Recent Outpatient Visits           6 months ago Seizure disorder [facility])   [facility], Devonne Doughty, DO   9 months ago Gastroesophageal reflux disease, unspecified whether esophagitis present   [facility] Merrilyn Puma, Jerrel Ivory, NP   1 year ago Low TSH level   [facility] Merrilyn Puma, Jerrel Ivory, NP   1 year ago High risk medication use   [facility] Merrilyn Puma, Jerrel Ivory, NP   1 year ago Unintended weight loss   [facility] Merrilyn Puma, Jerrel Ivory, NP                chlorproMAZINE (THORAZINE) 50 MG tablet [Pharmacy Med Name: chlorproMAZINE HCl 50 MG Tablet] 120 tablet 10    Sig: TAKE ONE TABLET BY MOUTH FOUR TIMES A DAY      Not Delegated - Psychiatry:  Antipsychotics - First Generation (Typical) Failed - 12/17/2019 12:00 PM      Failed - This refill cannot be delegated      Failed - Valid encounter within last 6 months    Recent Outpatient Visits           6 months ago Seizure disorder [facility])   [facility], Devonne Doughty, DO   9 months ago Gastroesophageal reflux disease, unspecified whether esophagitis present   [facility] Merrilyn Puma, Jerrel Ivory, NP   1 year ago Low TSH level   [facility] Merrilyn Puma, Jerrel Ivory, NP   1 year ago High risk medication use   [facility] Merrilyn Puma, Jerrel Ivory, NP   1 year ago Unintended weight loss   [facility], Jerrel Ivory, NP              Passed - Cr in normal range and within 360 days    Creat  Date Value Ref Range Status  06/03/2019 0.83 0.70 - 1.25 mg/dL Final    Comment:    For patients >14 years of age, the reference limit for Creatinine is approximately 13% higher for people identified as African-American. .    Creatinine, Ser  Date Value Ref Range Status  11/26/2019 0.82 0.61 - 1.24 mg/dL Final          Passed - AST in normal range and within 360  days    AST  Date Value Ref Range Status  11/26/2019 19 15 - 41 U/L Final          Passed - ALT in normal range and within 360 days    ALT  Date Value Ref Range Status  11/26/2019 12 0 - 44 U/L Final           Signed Prescriptions Disp Refills   traZODone (DESYREL) 100 MG tablet 30 tablet 0    Sig: TAKE ONE TABLET BY MOUTH AT BEDTIME      Psychiatry: Antidepressants - Serotonin Modulator Failed - 12/17/2019 12:00 PM      Failed - Valid encounter within last 6 months    Recent Outpatient Visits           6 months ago Seizure disorder [facility])   [facility] Olin Hauser, DO   9 months ago Gastroesophageal reflux disease, unspecified whether esophagitis present   [facility] Merrilyn Puma, Jerrel Ivory, NP   1 year ago Low TSH level   [facility] Merrilyn Puma, Jerrel Ivory, NP   1 year ago High risk medication use   [facility] Merrilyn Puma, Jerrel Ivory, NP   1 year ago Unintended weight loss   [facility], Jerrel Ivory, NP                potassium chloride SA (KLOR-CON) 20 MEQ tablet 30 tablet 0    Sig: TAKE 1 TABLET BY MOUTH ONCE DAILY *DO NOT CRUSH*      Endocrinology:  Minerals - Potassium Supplementation Passed - 12/17/2019 12:00 PM      Passed - K in normal range and within 360 days    Potassium  Date Value Ref Range Status  11/26/2019 4.4 3.5 - 5.1 mmol/L Final          Passed - Cr in normal range and within 360 days    Creat  Date Value Ref Range Status  06/03/2019 0.83 0.70 - 1.25 mg/dL Final    Comment:    For patients >34 years of age, the reference limit for Creatinine is approximately 13% higher for people identified as African-American. .    Creatinine, Ser  Date Value Ref Range Status  11/26/2019 0.82 0.61 - 1.24 mg/dL Final          Passed - Valid encounter within last 12 months    Recent Outpatient Visits           6 months ago Seizure disorder [facility])   [facility], Devonne Doughty, DO   9 months ago Gastroesophageal reflux disease, unspecified whether esophagitis present   [facility] Merrilyn Puma, Jerrel Ivory, NP   1 year ago Low TSH level   [facility] Merrilyn Puma, Jerrel Ivory, NP   1 year ago High risk medication use   [facility] Merrilyn Puma, Jerrel Ivory, NP   1 year ago Unintended weight loss   [facility] Merrilyn Puma, Jerrel Ivory, NP               Refused Prescriptions Disp Refills   sertraline (ZOLOFT) 25 MG tablet [Pharmacy Med Name: Sertraline HCl 25 MG Tablet] 30 tablet 10    Sig: TAKE ONE [place]      Psychiatry:  Antidepressants - SSRI Failed - 12/17/2019 12:00 PM      Failed - Valid encounter within last  6 months    Recent Outpatient Visits           6 months ago Seizure disorder [facility])   [place], DO   9 months ago Gastroesophageal reflux disease, unspecified whether esophagitis present   [facility] Merrilyn Puma, Jerrel Ivory, NP   1 year ago Low TSH level   [facility] Merrilyn Puma, Jerrel Ivory, NP   1 year ago High risk medication use   [facility] Merrilyn Puma, Jerrel Ivory, NP   1 year ago Unintended weight loss   [facility] Merrilyn Puma, Jerrel Ivory, NP

## 2019-12-17 NOTE — Telephone Encounter (Signed)
Requested Prescriptions  Pending Prescriptions Disp Refills   sertraline (ZOLOFT) 25 MG tablet [Pharmacy Med Name: Sertraline HCl 25 MG Tablet] 30 tablet 10    Sig: TAKE ONE TABLET BY MOUTH EVERY DAY     Psychiatry:  Antidepressants - SSRI Failed - 12/17/2019 12:00 PM      Failed - Valid encounter within last 6 months    Recent Outpatient Visits          6 months ago Seizure disorder [facility])   [facility], Devonne Doughty, DO   9 months ago Gastroesophageal reflux disease, unspecified whether esophagitis present   [facility] Merrilyn Puma, Jerrel Ivory, NP   1 year ago Low TSH level   [facility] Merrilyn Puma, Jerrel Ivory, NP   1 year ago High risk medication use   [facility] Merrilyn Puma, Jerrel Ivory, NP   1 year ago Unintended weight loss   [place], Jerrel Ivory, NP              baclofen (LIORESAL) 10 MG tablet [Pharmacy Med Name: Baclofen 10 MG Tablet] 120 each 10    Sig: TAKE ONE TABLET BY MOUTH FOUR TIMES A DAY     Not Delegated - Analgesics:  Muscle Relaxants Failed - 12/17/2019 12:00 PM      Failed - This refill cannot be delegated      Failed - Valid encounter within last 6 months    Recent Outpatient Visits          6 months ago Seizure disorder [facility])   [facility], Devonne Doughty, DO   9 months ago Gastroesophageal reflux disease, unspecified whether esophagitis present   [facility] Merrilyn Puma, Jerrel Ivory, NP   1 year ago Low TSH level   [facility] Merrilyn Puma, Jerrel Ivory, NP   1 year ago High risk medication use   [facility] Merrilyn Puma, Jerrel Ivory, NP   1 year ago Unintended weight loss   [place], Jerrel Ivory, NP              traZODone ([place]) 100 MG tablet [Pharmacy Med Name: traZODone HCl 100 MG Tablet] 30 tablet 0    Sig: TAKE ONE TABLET BY MOUTH AT BEDTIME      Psychiatry: Antidepressants - Serotonin Modulator Failed - 12/17/2019 12:00 PM      Failed - Valid encounter within last 6 months    Recent Outpatient Visits          6 months ago Seizure disorder [facility])   [place], DO   9 months ago Gastroesophageal reflux disease, unspecified whether esophagitis present   [facility] Merrilyn Puma, Jerrel Ivory, NP   1 year ago Low TSH level   [facility] Merrilyn Puma, Jerrel Ivory, NP   1 year ago High risk medication use   [facility] Merrilyn Puma, Jerrel Ivory, NP   1 year ago Unintended weight loss   [facility] Merrilyn Puma, Jerrel Ivory, NP              chlorproMAZINE (THORAZINE) 50 MG tablet [Pharmacy Med Name: chlorproMAZINE HCl 50 MG Tablet] 120 tablet 10    Sig: TAKE ONE TABLET BY MOUTH FOUR TIMES A DAY     Not Delegated - Psychiatry:  Antipsychotics - First Generation (Typical) Failed - 12/17/2019 12:00 PM  Failed - This refill cannot be delegated      Failed - Valid encounter within last 6 months    Recent Outpatient Visits          6 months ago Seizure disorder [facility])   [facility], Devonne Doughty, DO   9 months ago Gastroesophageal reflux disease, unspecified whether esophagitis present   [facility] Merrilyn Puma, Jerrel Ivory, NP   1 year ago Low TSH level   [facility] Merrilyn Puma, Jerrel Ivory, NP   1 year ago High risk medication use   [facility] Merrilyn Puma, Jerrel Ivory, NP   1 year ago Unintended weight loss   [facility], Jerrel Ivory, NP             Passed - Cr in normal range and within 360 days    Creat  Date Value Ref Range Status  06/03/2019 0.83 0.70 - 1.25 mg/dL Final    Comment:    For patients >42 years of age, the reference limit for Creatinine is approximately 13% higher for people identified as African-American. .     Creatinine, Ser  Date Value Ref Range Status  11/26/2019 0.82 0.61 - 1.24 mg/dL Final         Passed - AST in normal range and within 360 days    AST  Date Value Ref Range Status  11/26/2019 19 15 - 41 U/L Final         Passed - ALT in normal range and within 360 days    ALT  Date Value Ref Range Status  11/26/2019 12 0 - 44 U/L Final          potassium chloride SA (KLOR-CON) 20 MEQ tablet [Pharmacy Med Name: Potassium Chloride Crys ER 20 MEQ Tablet extended release] 30 tablet 0    Sig: TAKE 1 TABLET BY MOUTH ONCE DAILY *DO NOT CRUSH*     Endocrinology:  Minerals - Potassium Supplementation Passed - 12/17/2019 12:00 PM      Passed - K in normal range and within 360 days    Potassium  Date Value Ref Range Status  11/26/2019 4.4 3.5 - 5.1 mmol/L Final         Passed - Cr in normal range and within 360 days    Creat  Date Value Ref Range Status  06/03/2019 0.83 0.70 - 1.25 mg/dL Final    Comment:    For patients >33 years of age, the reference limit for Creatinine is approximately 13% higher for people identified as African-American. .    Creatinine, Ser  Date Value Ref Range Status  11/26/2019 0.82 0.61 - 1.24 mg/dL Final         Passed - Valid encounter within last 12 months    Recent Outpatient Visits          6 months ago Seizure disorder [facility])   [place], DO   9 months ago Gastroesophageal reflux disease, unspecified whether esophagitis present   [facility] Merrilyn Puma, Jerrel Ivory, NP   1 year ago Low TSH level   [facility] Merrilyn Puma, Jerrel Ivory, NP   1 year ago High risk medication use   [facility] Mikey [place], NP   1 year ago Unintended weight loss   [facility] Merrilyn Puma, Jerrel Ivory, NP  Pt. Is overdue for 6 mo. F/u.  Call placed to the [facility] center that he resides at;  Was advised to call back in the morning  and ask for the Administrator, Junious Dresser, to schedule f/u appt.  Gave 30 day courtesy refill on Trazodone, and Potassium Chl.  Sertraline is to be managed by Psychiatry.  Sending Baclofen and Chlorpromazine to Provider, as they are not delegated.

## 2019-12-21 ENCOUNTER — Other Ambulatory Visit: Payer: Self-pay | Admitting: Family Medicine

## 2019-12-21 NOTE — Telephone Encounter (Signed)
Open in error

## 2019-12-22 ENCOUNTER — Other Ambulatory Visit: Payer: Self-pay

## 2020-01-04 ENCOUNTER — Other Ambulatory Visit: Payer: Self-pay | Admitting: Gastroenterology

## 2020-01-07 ENCOUNTER — Other Ambulatory Visit: Payer: Self-pay | Admitting: Psychiatry

## 2020-01-07 DIAGNOSIS — F509 Eating disorder, unspecified: Secondary | ICD-10-CM

## 2020-01-07 DIAGNOSIS — F419 Anxiety disorder, unspecified: Secondary | ICD-10-CM

## 2020-01-15 ENCOUNTER — Other Ambulatory Visit: Payer: Self-pay | Admitting: Family Medicine

## 2020-01-15 DIAGNOSIS — F509 Eating disorder, unspecified: Secondary | ICD-10-CM

## 2020-01-15 DIAGNOSIS — F419 Anxiety disorder, unspecified: Secondary | ICD-10-CM

## 2020-01-15 NOTE — Telephone Encounter (Signed)
Requested medication (s) are due for refill today: yes  Requested medication (s) are on the active medication list: yes  Last refill:  Mirtazapine 10/28/19 #20 0 refills,  trazodone 12/17/19 #30 0 refills  Future visit scheduled: No  Notes to clinic:  Attempted to call patient Number no longer in service Labs needed Last OV 06/03/19    Requested Prescriptions  Pending Prescriptions Disp Refills   mirtazapine (REMERON) 15 MG tablet [Pharmacy Med Name: Mirtazapine 15 MG Tablet] 30 tablet 10    Sig: TAKE 1 TABLET BY MOUTH AT BEDTIME      Psychiatry: Antidepressants - mirtazapine Failed - 01/15/2020  3:36 PM      Failed - Triglycerides in normal range and within 360 days    No results found for: TRIG, POCTRIG        Failed - Total Cholesterol in normal range and within 360 days    No results found for: CHOL, POCCHOL, CHOLTOT        Failed - Valid encounter within last 6 months    Recent Outpatient Visits           7 months ago Seizure disorder [facility])   [place], DO   10 months ago Gastroesophageal reflux disease, unspecified whether esophagitis present   [facility] Merrilyn Puma, Jerrel Ivory, NP   1 year ago Low TSH level   [facility] Merrilyn Puma, Jerrel Ivory, NP   1 year ago High risk medication use   [facility] Merrilyn Puma, Jerrel Ivory, NP   2 years ago Unintended weight loss   [place], NP              Passed - AST in normal range and within 360 days    AST  Date Value Ref Range Status  11/26/2019 19 15 - 41 U/L Final          Passed - ALT in normal range and within 360 days    ALT  Date Value Ref Range Status  11/26/2019 12 0 - 44 U/L Final          Passed - WBC in normal range and within 360 days    WBC  Date Value Ref Range Status  11/26/2019 6.6 4.0 - 10.5 K/uL Final            traZODone (DESYREL) 100 MG tablet [Pharmacy Med  Name: traZODone HCl 100 MG Tablet] 30 tablet 10    Sig: TAKE 1 TABLET BY MOUTH AT BEDTIME      Psychiatry: Antidepressants - Serotonin Modulator Failed - 01/15/2020  3:36 PM      Failed - Valid encounter within last 6 months    Recent Outpatient Visits           7 months ago Seizure disorder [facility])   [place], DO   10 months ago Gastroesophageal reflux disease, unspecified whether esophagitis present   [facility] Merrilyn Puma, Jerrel Ivory, NP   1 year ago Low TSH level   [facility] Merrilyn Puma, Jerrel Ivory, NP   1 year ago High risk medication use   [facility] Merrilyn Puma, Jerrel Ivory, NP   2 years ago Unintended weight loss   [facility] Merrilyn Puma, Jerrel Ivory, NP

## 2020-01-22 ENCOUNTER — Telehealth: Payer: Self-pay

## 2020-01-22 ENCOUNTER — Ambulatory Visit: Payer: Self-pay | Admitting: Family Medicine

## 2020-01-22 NOTE — Telephone Encounter (Signed)
I called and spoke with Jeremiah Nguyen at the facility to f/u on why Jeremiah Nguyen appt was cancelled. I offered to reschedule the appt and he informed me that I need to speak with Tammy the owner of the facility.  I called Tammy and informed her that we are unable to do any refills without an appt. She informed me that he is good on his medications and she will call back on Tuesday and schedule him an f/u appt.

## 2020-02-03 ENCOUNTER — Telehealth: Payer: Self-pay

## 2020-02-03 NOTE — Telephone Encounter (Signed)
I called and spoke with Jeremiah Nguyen (caregiver) about scheduling a f/u appt for Jeremiah Nguyen. The pt is overdue for a f/u appt and he needs to be seen in order to continue to get refill on his medication. An appt was scheduled for Friday, Sept 17th at 9:40am .

## 2020-02-03 NOTE — Telephone Encounter (Signed)
See previous message

## 2020-02-05 ENCOUNTER — Ambulatory Visit: Payer: Self-pay | Admitting: Family Medicine

## 2020-02-08 ENCOUNTER — Telehealth: Payer: Self-pay

## 2020-02-08 NOTE — Telephone Encounter (Signed)
I contacted [facility] to f/u on the patient medications. I spoke with Jeani Hawking and she informed me that she will reach out to the patient caregiver Tammy and notify her that he will not receive anymore refills on his medications if he doesn't come in the office for an appt.   I informed her that I reached out to the home 3x and scheduled 2 appointments that was cancelled.

## 2020-05-23 ENCOUNTER — Encounter: Payer: Self-pay | Admitting: Podiatry

## 2020-05-23 ENCOUNTER — Other Ambulatory Visit: Payer: Self-pay

## 2020-05-23 ENCOUNTER — Ambulatory Visit (INDEPENDENT_AMBULATORY_CARE_PROVIDER_SITE_OTHER): Payer: Medicare Other | Admitting: Podiatry

## 2020-05-23 DIAGNOSIS — B351 Tinea unguium: Secondary | ICD-10-CM | POA: Diagnosis not present

## 2020-05-23 DIAGNOSIS — M79674 Pain in right toe(s): Secondary | ICD-10-CM

## 2020-05-23 DIAGNOSIS — M79675 Pain in left toe(s): Secondary | ICD-10-CM | POA: Diagnosis not present

## 2020-05-23 NOTE — Progress Notes (Signed)
This patient presents  to the office for evaluation and treatment of long thick painful nails .  This patient is unable to trim his own nails since the patient cannot reach his feet.  Patient says the nails are painful walking and wearing his shoes. He is brought to the office by a caregiver.  He returns for preventive foot care services.  General Appearance  Alert, conversant and in no acute stress.  Vascular  Dorsalis pedis and posterior tibial  pulses are not  palpable  bilaterally.  Capillary return is within normal limits  bilaterally. Temperature is within normal limits  bilaterally.  Neurologic  Senn-Weinstein monofilament wire test within normal limits  bilaterally. Muscle power within normal limits bilaterally.  Nails Thick disfigured discolored nails with subungual debris  from hallux to fifth toes bilaterally. No evidence of bacterial infection or drainage bilaterally.  Orthopedic  No limitations of motion  feet .  No crepitus or effusions noted.  HAV  B/L.  Hammer toes  B/L.  Skin  normotropic skin with no porokeratosis noted bilaterally.  No signs of infections or ulcers noted.     Onychomycosis  Pain in toes right foot  Pain in toes left foot  Debridement  of nails  1-5  B/L with a nail nipper.  Nails were then filed using a dremel tool with no incidents.    RTC  3 months   Ebony Yorio DPM  

## 2020-05-24 ENCOUNTER — Encounter: Payer: Self-pay | Admitting: Emergency Medicine

## 2020-05-24 ENCOUNTER — Emergency Department: Payer: Medicare Other

## 2020-05-24 ENCOUNTER — Emergency Department
Admission: EM | Admit: 2020-05-24 | Discharge: 2020-05-25 | Disposition: A | Discharge status: Home / Self Care | Payer: Medicare Other | Attending: Emergency Medicine | Admitting: Emergency Medicine

## 2020-05-24 ENCOUNTER — Other Ambulatory Visit: Payer: Self-pay

## 2020-05-24 DIAGNOSIS — R410 Disorientation, unspecified: Secondary | ICD-10-CM | POA: Diagnosis not present

## 2020-05-24 DIAGNOSIS — J449 Chronic obstructive pulmonary disease, unspecified: Secondary | ICD-10-CM | POA: Diagnosis not present

## 2020-05-24 DIAGNOSIS — F17200 Nicotine dependence, unspecified, uncomplicated: Secondary | ICD-10-CM | POA: Diagnosis not present

## 2020-05-24 LAB — COMPREHENSIVE METABOLIC PANEL
ALT: 17 U/L (ref 0–44)
AST: 23 U/L (ref 15–41)
Albumin: 4 g/dL (ref 3.5–5.0)
Alkaline Phosphatase: 112 U/L (ref 38–126)
Anion gap: 7 (ref 5–15)
BUN: 11 mg/dL (ref 8–23)
CO2: 26 mmol/L (ref 22–32)
Calcium: 9.1 mg/dL (ref 8.9–10.3)
Chloride: 102 mmol/L (ref 98–111)
Creatinine, Ser: 0.85 mg/dL (ref 0.61–1.24)
GFR, Estimated: >60 mL/min (ref >60)
Glucose, Bld: 95 mg/dL (ref 70–99)
Potassium: 4.2 mmol/L (ref 3.5–5.1)
Sodium: 135 mmol/L (ref 135–145)
Total Bilirubin: 0.5 mg/dL (ref 0.3–1.2)
Total Protein: 7.4 g/dL (ref 6.5–8.1)

## 2020-05-24 LAB — CBC
HCT: 45.3 % (ref 39.0–52.0)
Hemoglobin: 14.9 g/dL (ref 13.0–17.0)
MCH: 29.8 pg (ref 26.0–34.0)
MCHC: 32.9 g/dL (ref 30.0–36.0)
MCV: 90.6 fL (ref 80.0–100.0)
Platelets: 242 10*3/uL (ref 150–400)
RBC: 5 MIL/uL (ref 4.22–5.81)
RDW: 15.9 % — ABNORMAL HIGH (ref 11.5–15.5)
WBC: 7.5 10*3/uL (ref 4.0–10.5)
nRBC: 0 % (ref 0.0–0.2)

## 2020-05-24 LAB — URINALYSIS, COMPLETE (UACMP) WITH MICROSCOPIC
Bacteria, UA: NONE SEEN
Bilirubin Urine: NEGATIVE
Glucose, UA: NEGATIVE mg/dL
Hgb urine dipstick: NEGATIVE
Ketones, ur: NEGATIVE mg/dL
Nitrite: NEGATIVE
Protein, ur: NEGATIVE mg/dL
Specific Gravity, Urine: 1.005 (ref 1.005–1.030)
Squamous Epithelial / HPF: NONE SEEN (ref 0–5)
pH: 5 (ref 5.0–8.0)

## 2020-05-24 LAB — CBC WITH DIFFERENTIAL/PLATELET
Abs Immature Granulocytes: 0.01 10*3/uL (ref 0.00–0.07)
Basophils Absolute: 0.1 10*3/uL (ref 0.0–0.1)
Basophils Relative: 2 %
Eosinophils Absolute: 0.3 10*3/uL (ref 0.0–0.5)
Eosinophils Relative: 4 %
HCT: 45.1 % (ref 39.0–52.0)
Hemoglobin: 14.8 g/dL (ref 13.0–17.0)
Immature Granulocytes: 0 %
Lymphocytes Relative: 25 %
Lymphs Abs: 1.9 10*3/uL (ref 0.7–4.0)
MCH: 30 pg (ref 26.0–34.0)
MCHC: 32.8 g/dL (ref 30.0–36.0)
MCV: 91.3 fL (ref 80.0–100.0)
Monocytes Absolute: 0.8 10*3/uL (ref 0.1–1.0)
Monocytes Relative: 10 %
Neutro Abs: 4.4 10*3/uL (ref 1.7–7.7)
Neutrophils Relative %: 59 %
Platelets: 254 10*3/uL (ref 150–400)
RBC: 4.94 MIL/uL (ref 4.22–5.81)
RDW: 16.3 % — ABNORMAL HIGH (ref 11.5–15.5)
WBC: 7.5 10*3/uL (ref 4.0–10.5)
nRBC: 0 % (ref 0.0–0.2)

## 2020-05-24 LAB — BLOOD GAS, VENOUS
Acid-Base Excess: 3.7 mmol/L — ABNORMAL HIGH (ref 0.0–2.0)
Bicarbonate: 29.2 mmol/L — ABNORMAL HIGH (ref 20.0–28.0)
O2 Saturation: 53.6 %
Patient temperature: 37
pCO2, Ven: 46 mmHg (ref 44.0–60.0)
pH, Ven: 7.41 (ref 7.250–7.430)
pO2, Ven: <31 mmHg — CL (ref 32.0–45.0)

## 2020-05-24 LAB — LACTIC ACID, PLASMA: Lactic Acid, Venous: 1.1 mmol/L (ref 0.5–1.9)

## 2020-05-24 LAB — TROPONIN I (HIGH SENSITIVITY): Troponin I (High Sensitivity): 16 ng/L (ref <18)

## 2020-05-24 NOTE — ED Notes (Signed)
Pt ambulatory throughout waiting room.

## 2020-05-24 NOTE — ED Provider Notes (Signed)
[facility] Emergency Department Provider Note   ____________________________________________   Event Date/Time   First MD Initiated Contact with Patient 05/24/20 2137     (approximate)  I have reviewed the triage vital signs and the nursing notes.   HISTORY  Chief Complaint Altered Mental Status    HPI Jeremiah Nguyen is a 65 y.o. male who comes from a residential facility.  Reportedly he was confused today about where his clothes were and he urinated on himself which is not normal for him.  Reportedly he did not fall.  Patient himself thinks he fell.  He does not remember his address or the president or what holiday recently passed.  He says he does not follow the news.  He does know that it is January 3 or 4 in 2022.  He knows it is Tuesday.  He says he feels fine.  He has no complaints of headache or coughing or shortness of breath or belly ache or any other problems.        Past Medical History:  Diagnosis Date  . COPD (chronic obstructive pulmonary disease) ([place])   . Eating disorder   . GERD (gastroesophageal reflux disease)   . Liver disease   . Seizures ([place])   . Sleep apnea     Patient Active Problem List   Diagnosis Date Noted  . Pain due to onychomycosis of toenails of both feet 05/23/2020  . High risk medication use 11/12/2019  . Seizure-like activity ([place]) 11/09/2019  . Abnormal brain MRI 07/27/2019  . Eating disorder 07/22/2019  . Mild protein-calorie malnutrition ([place]) 06/03/2019  . Alcohol use disorder, severe, in sustained remission ([place]) 06/03/2019  . Person living in residential institution 06/02/2018  . Problems related to living in residential institution 06/02/2018  . Liver disease, chronic 01/07/2018  . Seizures ([place]) 06/10/2017  . Gastroesophageal reflux disease 06/10/2017  . Sepsis ([place]) 11/25/2014  . Tobacco abuse 11/25/2014    Past Surgical History:  Procedure Laterality Date  . ESOPHAGOGASTRODUODENOSCOPY (EGD)  WITH PROPOFOL N/A 02/25/2018   Procedure: ESOPHAGOGASTRODUODENOSCOPY (EGD) WITH PROPOFOL;  Surgeon: [name], MD;  Location: [facility] ENDOSCOPY;  Service: Gastroenterology;  Laterality: N/A;  . LESION EXCISION Left [id number]   follicular cyst  . SKIN BIOPSY      Prior to Admission medications   Medication Sig Start Date End Date Taking? Authorizing Provider  baclofen (LIORESAL) 10 MG tablet TAKE ONE TABLET BY MOUTH FOUR TIMES A DAY 12/17/19   Malfi, Lupita Raider, FNP  buPROPion (WELLBUTRIN) 75 MG tablet Take 1 tablet (75 mg total) by mouth daily. 07/01/18   Ursula Alert, MD  chlorproMAZINE (THORAZINE) 50 MG tablet TAKE ONE TABLET BY MOUTH FOUR TIMES A DAY 12/17/19   Malfi, Lupita Raider, FNP  lamoTRIgine (LAMICTAL) 25 MG tablet Take by mouth. 04/13/20   [provider]  losartan (COZAAR) 25 MG tablet Take 25 mg by mouth daily. 04/13/20   [provider]  metoprolol succinate (TOPROL-XL) 25 MG 24 hr tablet Take 25 mg by mouth 2 (two) times daily. 04/13/20   [provider]  mirtazapine (REMERON) 15 MG tablet TAKE 1 TABLET BY MOUTH AT BEDTIME 10/28/19   Karamalegos, Devonne Doughty, DO  NON-ASPIRIN PAIN RELIEVER 325 MG tablet TAKE ONE TABLET BY MOUTH EVERY 4 HOURS AS NEEDED 12/17/19   [place], Devonne Doughty, DO  omeprazole (PRILOSEC) 40 MG capsule TAKE ONE CAPSULE BY MOUTH EVERY DAY *DO NOT CRUSH* 01/04/20   [name], MD  PHENObarbital (LUMINAL) 60  MG tablet Take 1 tablet (60 mg total) by mouth at bedtime. 07/14/19   Malfi, Jodelle Gross, FNP  phenytoin (DILANTIN) 100 MG ER capsule TAKE 2 CAPSULES BY MOUTH EVERY MORNING *DO NOT CRUSH OR CHEW* *HAZARDOUS DRUG: WEAR GLOVES*;TAKE 3 CAPSULES BY MOUTH EVERY EVENING *DO NOT CRUSH OR CHEW* *HAZARDOUS DRUG: WEAR GLOVES* 10/28/19   Malfi, Jodelle Gross, FNP  Potassium Chloride ER 20 MEQ TBCR Take 20 mEq by mouth daily. 09/02/18   Galen Manila, NP  potassium chloride SA (KLOR-CON) 20 MEQ tablet TAKE 1 TABLET BY MOUTH ONCE DAILY *DO NOT CRUSH* 12/17/19    Karamalegos, Netta Neat, DO  SENNA-PLUS 8.6-50 MG tablet TAKE 1 TABLET  BY MOUTH TWICE DAILY 01/15/20   Althea Charon, Netta Neat, DO  sertraline (ZOLOFT) 25 MG tablet Take 1 tablet (25 mg total) by mouth daily. 09/02/18   Galen Manila, NP  traZODone (DESYREL) 100 MG tablet TAKE ONE TABLET BY MOUTH AT BEDTIME 12/17/19   Smitty Cords, DO    Allergies Patient has no known allergies.  Family History  Problem Relation Age of Onset  . Cancer Mother        breast    Social History Social History   Tobacco Use  . Smoking status: Current Every Day Smoker    Packs/day: 0.75    Years: 30.00    Pack years: 22.50  . Smokeless tobacco: Never Used  Vaping Use  . Vaping Use: Never used  Substance Use Topics  . Alcohol use: No  . Drug use: No    Review of Systems  Constitutional: No fever/chills Eyes: No visual changes. ENT: No sore throat. Cardiovascular: Denies chest pain. Respiratory: Denies shortness of breath. Gastrointestinal: No abdominal pain.  No nausea, no vomiting.  No diarrhea.  No constipation. Genitourinary: Negative for dysuria. Musculoskeletal: Negative for back pain. Skin: Negative for rash. Neurological: Negative for headaches, focal weakness   ____________________________________________   PHYSICAL EXAM:  VITAL SIGNS: ED Triage Vitals  Enc Vitals Group     BP 05/24/20 1439 131/63     Pulse Rate 05/24/20 1439 71     Resp 05/24/20 1439 16     Temp 05/24/20 1439 98.5 F (36.9 C)     Temp Source 05/24/20 1439 Oral     SpO2 05/24/20 1439 96 %     Weight 05/24/20 1440 144 lb (65.3 kg)     Height 05/24/20 1440 5\' 6"  (1.676 m)     Head Circumference --      Peak Flow --      Pain Score 05/24/20 1440 0     Pain Loc --      Pain Edu? --      Excl. in GC? --     Constitutional: Alert and oriented. Well appearing and in no acute distress. Eyes: Conjunctivae are normal.  Head: Atraumatic. Nose: No congestion/rhinnorhea. Mouth/Throat:  Mucous membranes are moist.  Oropharynx non-erythematous. Neck: No stridor.   Cardiovascular: Normal rate, regular rhythm. Grossly normal heart sounds.  Good peripheral circulation. Respiratory: Normal respiratory effort.  No retractions. Lungs CTAB. Gastrointestinal: Soft and nontender. No distention. No abdominal bruits.  Musculoskeletal: No lower extremity tenderness nor edema.  No joint effusions. Neurologic:  Normal speech and language. No gross focal neurologic deficits are appreciated. No gait instability. Skin:  Skin is warm, dry and intact. No rash noted. Psychiatric: Mood and affect are normal. Speech and behavior are normal.  ____________________________________________   LABS (all labs ordered are listed, but  only abnormal results are displayed)  Labs Reviewed  CBC - Abnormal; Notable for the following components:      Result Value   RDW 15.9 (*)    All other components within normal limits  URINALYSIS, COMPLETE (UACMP) WITH MICROSCOPIC - Abnormal; Notable for the following components:   Color, Urine STRAW (*)    APPearance CLEAR (*)    Leukocytes,Ua TRACE (*)    All other components within normal limits  BLOOD GAS, VENOUS - Abnormal; Notable for the following components:   pO2, Ven <31.0 (*)    Bicarbonate 29.2 (*)    Acid-Base Excess 3.7 (*)    All other components within normal limits  CBC WITH DIFFERENTIAL/PLATELET - Abnormal; Notable for the following components:   RDW 16.3 (*)    All other components within normal limits  COMPREHENSIVE METABOLIC PANEL  LACTIC ACID, PLASMA  TROPONIN I (HIGH SENSITIVITY)  TROPONIN I (HIGH SENSITIVITY)   ____________________________________________  EKG EKG read interpreted by me shows normal sinus rhythm rate of 61 normal axis nonspecific ST-T wave changes very irregular baseline ____________________________________________  RADIOLOGY Gertha Calkin, personally viewed and evaluated these images (plain radiographs) as  part of my medical decision making, as well as reviewing the written report by the radiologist.  ED MD interpretation: Chest x-ray read by radiology reviewed by me shows no acute disease head CT also read by radiology reviewed by me also shows no acute disease  Official radiology report(s): DG Chest 2 View  Result Date: 05/24/2020 CLINICAL DATA:  65 year old male with altered mental status. EXAM: CHEST - 2 VIEW COMPARISON:  Chest radiograph dated 11/26/2019. FINDINGS: Background of emphysema. No focal consolidation, pleural effusion, or pneumothorax. The cardiac silhouette is within limits. Atherosclerotic calcification of the aorta. No acute osseous pathology. IMPRESSION: No active cardiopulmonary disease. Electronically Signed   By: Anner Crete M.D.   On: 05/24/2020 22:07   CT Head Wo Contrast  Result Date: 05/24/2020 CLINICAL DATA:  65 year old male with altered mental status. EXAM: CT HEAD WITHOUT CONTRAST TECHNIQUE: Contiguous axial images were obtained from the base of the skull through the vertex without intravenous contrast. COMPARISON:  Head CT dated 11/26/2019. FINDINGS: Brain: Mild age-related atrophy and chronic microvascular ischemic changes. There is no acute intracranial hemorrhage. No mass effect or midline shift. No extra-axial fluid collection. Vascular: No hyperdense vessel or unexpected calcification. Skull: Normal. Negative for fracture or focal lesion. Sinuses/Orbits: Mild mucoperiosteal thickening of paranasal sinuses. The mastoid air cells are clear. Cerumen noted in the left external auditory canal. Other: None IMPRESSION: 1. No acute intracranial pathology. 2. Mild age-related atrophy and chronic microvascular ischemic changes. Electronically Signed   By: Anner Crete M.D.   On: 05/24/2020 22:16    ____________________________________________   PROCEDURES  Procedure(s) performed (including Critical  Care):  Procedures   ____________________________________________   INITIAL IMPRESSION / ASSESSMENT AND PLAN / ED COURSE  Patient's head CT, EKG, chest x-ray, blood work and urine all do not show any cause for his apparent confusion.  He himself feels well and has been at baseline since he has been here.  I do not see a reason to admit him.  Additionally we do not have beds to admit him and keeping him here would just expose him to Covid further.  I will let him go as I believe he is stable currently.  I will have him follow-up with his regular doctor and he can return at any time.  ____________________________________________   FINAL CLINICAL IMPRESSION(S) / ED DIAGNOSES  Final diagnoses:  Episode of confusion     ED Discharge Orders    None      *Please note:  BLADEN BEVILACQUA was evaluated in Emergency Department on 05/24/2020 for the symptoms described in the history of present illness. He was evaluated in the context of the global COVID-19 pandemic, which necessitated consideration that the patient might be at risk for infection with the SARS-CoV-2 virus that causes COVID-19. Institutional protocols and algorithms that pertain to the evaluation of patients at risk for COVID-19 are in a state of rapid change based on information released by regulatory bodies including the CDC and federal and state organizations. These policies and algorithms were followed during the patient's care in the ED.  Some ED evaluations and interventions may be delayed as a result of limited staffing during and the pandemic.*   Note:  This document was prepared using Dragon voice recognition software and may include unintentional dictation errors.    Nena Polio, MD 05/24/20 [phone]

## 2020-05-24 NOTE — Discharge Instructions (Signed)
Patient's chest x-ray, CT of the head, EKG, urinalysis and blood work were all essentially within normal limits here.  Patient has been fine the whole time he was here.  I am going to let the patient go back home.  Please follow-up with his regular doctor tomorrow or the next day.  Please have him return here if he is worse again.

## 2020-05-24 NOTE — ED Notes (Signed)
Report given to group home, per them they are arranging transport home.

## 2020-05-24 NOTE — ED Triage Notes (Addendum)
Patient states to this RN, "they told me I fell".  Patient denies memory of a fall today.  Per EMS, patient is not here for a fall but for, "not acting right" and an episode of incontinence.  Patient seems to be answering questions appropriately.  Patient knew today was January 4th.  This RN called [facility] and I was told by staff that patient was confused today about where his clothes were and he had urinated himself which was not normal.  They stated that patient had not fallen.  Staff also stated that we should call [phone] at discharge for a ride.

## 2020-05-24 NOTE — ED Notes (Signed)
Pt given meal tray and drink at this time 

## 2020-05-24 NOTE — ED Triage Notes (Signed)
Pt comes via EMS from [facility]. Pt is not at his baseline. Pt urinated on himself. Pt hx of dementia. Pt just not acting right per staff.

## 2020-08-22 ENCOUNTER — Ambulatory Visit: Payer: Medicare Other | Admitting: Podiatry

## 2020-10-03 ENCOUNTER — Telehealth: Payer: Self-pay | Admitting: Podiatry

## 2020-10-03 NOTE — Telephone Encounter (Signed)
Called to reschedule appt on 5/19 was told that I needed to call [phone]. Reached out to that number and was told that there was not a patient there with that name. Called [phone] and unable to lvm

## 2020-10-06 ENCOUNTER — Ambulatory Visit: Payer: Medicare Other | Admitting: Podiatry

## 2020-10-27 ENCOUNTER — Ambulatory Visit: Payer: Medicare Other | Admitting: Podiatry

## 2020-11-03 ENCOUNTER — Ambulatory Visit: Payer: Medicare Other | Admitting: Podiatry

## 2020-12-01 ENCOUNTER — Ambulatory Visit (INDEPENDENT_AMBULATORY_CARE_PROVIDER_SITE_OTHER): Payer: Medicare Other | Admitting: Podiatry

## 2020-12-01 ENCOUNTER — Encounter: Payer: Self-pay | Admitting: Podiatry

## 2020-12-01 ENCOUNTER — Other Ambulatory Visit: Payer: Self-pay

## 2020-12-01 DIAGNOSIS — M79675 Pain in left toe(s): Secondary | ICD-10-CM | POA: Diagnosis not present

## 2020-12-01 DIAGNOSIS — B351 Tinea unguium: Secondary | ICD-10-CM

## 2020-12-01 DIAGNOSIS — M79674 Pain in right toe(s): Secondary | ICD-10-CM

## 2020-12-01 NOTE — Progress Notes (Signed)
This patient presents  to the office for evaluation and treatment of long thick painful nails .  This patient is unable to trim his own nails since the patient cannot reach his feet.  Patient says the nails are painful walking and wearing his shoes. He is brought to the office by a caregiver.  He returns for preventive foot care services.  General Appearance  Alert, conversant and in no acute stress.  Vascular  Dorsalis pedis and posterior tibial  pulses are not  palpable  bilaterally.  Capillary return is within normal limits  bilaterally. Temperature is within normal limits  bilaterally.  Neurologic  Senn-Weinstein monofilament wire test within normal limits  bilaterally. Muscle power within normal limits bilaterally.  Nails Thick disfigured discolored nails with subungual debris  from hallux to fifth toes bilaterally. No evidence of bacterial infection or drainage bilaterally.  Orthopedic  No limitations of motion  feet .  No crepitus or effusions noted.  HAV  B/L.  Hammer toes  B/L.  Skin  normotropic skin with no porokeratosis noted bilaterally.  No signs of infections or ulcers noted.     Onychomycosis  Pain in toes right foot  Pain in toes left foot  Debridement  of nails  1-5  B/L with a nail nipper.  Nails were then filed using a dremel tool with no incidents.    RTC  3 months   Breylen Agyeman DPM  

## 2020-12-05 ENCOUNTER — Other Ambulatory Visit: Payer: Self-pay

## 2020-12-05 ENCOUNTER — Ambulatory Visit: Payer: Medicare Other | Admitting: Anesthesiology

## 2020-12-05 ENCOUNTER — Ambulatory Visit
Admission: RE | Admit: 2020-12-05 | Discharge: 2020-12-05 | Disposition: A | Discharge status: Home / Self Care | Payer: Medicare Other | Attending: Gastroenterology | Admitting: Gastroenterology

## 2020-12-05 ENCOUNTER — Encounter: Payer: Self-pay | Admitting: *Deleted

## 2020-12-05 ENCOUNTER — Encounter
Admission: RE | Disposition: A | Discharge status: Home / Self Care | Payer: Self-pay | Source: Home / Self Care | Attending: Gastroenterology

## 2020-12-05 DIAGNOSIS — D124 Benign neoplasm of descending colon: Secondary | ICD-10-CM | POA: Insufficient documentation

## 2020-12-05 DIAGNOSIS — Z79899 Other long term (current) drug therapy: Secondary | ICD-10-CM | POA: Insufficient documentation

## 2020-12-05 DIAGNOSIS — Z7982 Long term (current) use of aspirin: Secondary | ICD-10-CM | POA: Insufficient documentation

## 2020-12-05 DIAGNOSIS — Z1211 Encounter for screening for malignant neoplasm of colon: Secondary | ICD-10-CM | POA: Diagnosis not present

## 2020-12-05 DIAGNOSIS — K5730 Diverticulosis of large intestine without perforation or abscess without bleeding: Secondary | ICD-10-CM | POA: Insufficient documentation

## 2020-12-05 DIAGNOSIS — K640 First degree hemorrhoids: Secondary | ICD-10-CM | POA: Insufficient documentation

## 2020-12-05 DIAGNOSIS — K219 Gastro-esophageal reflux disease without esophagitis: Secondary | ICD-10-CM | POA: Insufficient documentation

## 2020-12-05 HISTORY — PX: COLONOSCOPY WITH PROPOFOL: SHX5780

## 2020-12-05 SURGERY — COLONOSCOPY WITH PROPOFOL
Anesthesia: General

## 2020-12-05 MED ORDER — PROPOFOL 500 MG/50ML IV EMUL
INTRAVENOUS | Status: DC | PRN
  Administered 2020-12-05: 150 ug/kg/min via INTRAVENOUS

## 2020-12-05 MED ORDER — ALBUTEROL SULFATE HFA 108 (90 BASE) MCG/ACT IN AERS
INHALATION_SPRAY | RESPIRATORY_TRACT | Status: DC | PRN
  Administered 2020-12-05: 5 via RESPIRATORY_TRACT

## 2020-12-05 MED ORDER — SODIUM CHLORIDE 0.9 % IV SOLN
INTRAVENOUS | Status: DC
  Administered 2020-12-05: 1000 mL via INTRAVENOUS

## 2020-12-05 MED ORDER — PROPOFOL 10 MG/ML IV BOLUS
INTRAVENOUS | Status: DC | PRN
  Administered 2020-12-05: 40 mg via INTRAVENOUS

## 2020-12-05 MED ORDER — LIDOCAINE HCL (PF) 1 % IJ SOLN
INTRAMUSCULAR | Status: AC
  Filled 2020-12-05: qty 2

## 2020-12-05 MED ORDER — ALBUTEROL SULFATE HFA 108 (90 BASE) MCG/ACT IN AERS
INHALATION_SPRAY | RESPIRATORY_TRACT | Status: AC
  Filled 2020-12-05: qty 6.7

## 2020-12-05 NOTE — Anesthesia Preprocedure Evaluation (Signed)
Anesthesia Evaluation  Patient identified by MRN, date of birth, ID band Patient awake    Reviewed: Allergy & Precautions, H&P , NPO status , Patient's Chart, lab work & pertinent test results  History of Anesthesia Complications Negative for: history of anesthetic complications  Airway Mallampati: III  TM Distance: >3 FB Neck ROM: limited    Dental  (+) Edentulous Upper, Edentulous Lower   Pulmonary neg shortness of breath, sleep apnea , COPD,  COPD inhaler, Current SmokerPatient did not abstain from smoking.,   Hicupps   + wheezing      Cardiovascular (-) angina(-) Past MI and (-) DOE negative cardio ROS Normal cardiovascular exam     Neuro/Psych Seizures -, Well Controlled,  PSYCHIATRIC DISORDERS Dementia    GI/Hepatic Neg liver ROS, GERD  Medicated and Controlled,  Endo/Other  negative endocrine ROS  Renal/GU negative Renal ROS  negative genitourinary   Musculoskeletal   Abdominal Nondistended. Surgical incisions well healed  Peds  Hematology negative hematology ROS (+)   Anesthesia Other Findings A&Ox2  Past Medical History: No date: COPD (chronic obstructive pulmonary disease) (HCC) No date: Eating disorder No date: GERD (gastroesophageal reflux disease) No date: Liver disease No date: Seizures ([place]) No date: Sleep apnea  Past Surgical History: 12-15-13: LESION EXCISION; Left     Comment:  follicular cyst No date: SKIN BIOPSY     Reproductive/Obstetrics negative OB ROS                             Anesthesia Physical  Anesthesia Plan  ASA: III  Anesthesia Plan: General   Post-op Pain Management:    Induction: Intravenous  PONV Risk Score and Plan: Propofol infusion and TIVA  Airway Management Planned: Natural Airway and Nasal Cannula  Additional Equipment:   Intra-op Plan:   Post-operative Plan:   Informed Consent: I have reviewed the patients History and  Physical, chart, labs and discussed the procedure including the risks, benefits and alternatives for the proposed anesthesia with the patient or authorized representative who has indicated his/her understanding and acceptance.     Dental Advisory Given  Plan Discussed with: Anesthesiologist, CRNA and Surgeon  Anesthesia Plan Comments: (Discussed with patient and Clement Husbands who is care giver.    Patient consented for risks of anesthesia including but not limited to:  - adverse reactions to medications - risk of intubation if required - damage to teeth, lips or other oral mucosa - sore throat or hoarseness - Damage to heart, brain, lungs or loss of life  Patient voiced understanding.)        Anesthesia Quick Evaluation

## 2020-12-05 NOTE — Interval H&P Note (Signed)
History and Physical Interval Note:  12/05/2020 9:34 AM  Jeremiah Nguyen  has presented today for surgery, with the diagnosis of COLON CANCER SCREENING.  The various methods of treatment have been discussed with the patient and family. After consideration of risks, benefits and other options for treatment, the patient has consented to  Procedure(s): COLONOSCOPY WITH PROPOFOL (N/A) as a surgical intervention.  The patient's history has been reviewed, patient examined, no change in status, stable for surgery.  I have reviewed the patient's chart and labs.  Questions were answered to the patient's satisfaction.     Lesly Rubenstein  Ok to proceed with colonoscopy

## 2020-12-05 NOTE — Anesthesia Postprocedure Evaluation (Signed)
Anesthesia Post Note  [name]  Procedure(s) Performed: COLONOSCOPY WITH PROPOFOL  Patient location during evaluation: PACU Anesthesia Type: General Level of consciousness: awake and alert Pain management: pain level controlled Vital Signs Assessment: post-procedure vital signs reviewed and stable Respiratory status: spontaneous breathing, nonlabored ventilation, respiratory function stable and patient connected to nasal cannula oxygen Cardiovascular status: blood pressure returned to baseline and stable Postop Assessment: no apparent nausea or vomiting Anesthetic complications: no   No notable events documented.   Last Vitals:  Vitals:   12/05/20 1042 12/05/20 1051  BP: 127/64 130/77  Pulse:    Resp: 18   Temp: (!) 36.1 C   SpO2:      Last Pain:  Vitals:   12/05/20 1042  TempSrc: Temporal  PainSc: 0-No pain                 Iran Ouch

## 2020-12-05 NOTE — Op Note (Signed)
[facility] Patient Name: Jeremiah Nguyen Procedure Date: 12/05/2020 9:24 AM MRN: [id number] Account #: [id number] Date of Birth: 1955/09/04 Admit Type: Outpatient Age: 65 Room: [facility] ENDO ROOM 3 Gender: Male Note Status: Finalized Procedure:             Colonoscopy Indications:           Screening for colorectal malignant neoplasm Providers:             Andrey Farmer MD, MD Medicines:             Monitored Anesthesia Care Complications:         No immediate complications. Estimated blood loss:                         Minimal. Procedure:             Pre-Anesthesia Assessment:                        - Prior to the procedure, a History and Physical was                         performed, and patient medications and allergies were                         reviewed. The patient is competent. The risks and                         benefits of the procedure and the sedation options and                         risks were discussed with the patient. All questions                         were answered and informed consent was obtained.                         Patient identification and proposed procedure were                         verified by the physician, the nurse, the anesthetist                         and the technician in the endoscopy suite. Mental                         Status Examination: alert and oriented. Airway                         Examination: normal oropharyngeal airway and neck                         mobility. Respiratory Examination: clear to                         auscultation. CV Examination: normal. Prophylactic                         Antibiotics: The patient does not require prophylactic  antibiotics. Prior Anticoagulants: The patient has                         taken no previous anticoagulant or antiplatelet                         agents. ASA Grade Assessment: III - A patient with                          severe systemic disease. After reviewing the risks and                         benefits, the patient was deemed in satisfactory                         condition to undergo the procedure. The anesthesia                         plan was to use monitored anesthesia care (MAC).                         Immediately prior to administration of medications,                         the patient was re-assessed for adequacy to receive                         sedatives. The heart rate, respiratory rate, oxygen                         saturations, blood pressure, adequacy of pulmonary                         ventilation, and response to care were monitored                         throughout the procedure. The physical status of the                         patient was re-assessed after the procedure.                        After obtaining informed consent, the colonoscope was                         passed under direct vision. Throughout the procedure,                         the patient's blood pressure, pulse, and oxygen                         saturations were monitored continuously. The                         Colonoscope was introduced through the anus and                         advanced to the the cecum, identified by the ileocecal  valve. The colonoscopy was somewhat difficult due to                         unsatisfactory bowel prep. The patient tolerated the                         procedure well. The quality of the bowel preparation                         was fair except the cecum was unsatisfactory. Findings:      The perianal and digital rectal examinations were normal.      A diffuse area of moderate melanosis was found in the entire colon.      Two semi-pedunculated polyps were found in the descending colon. The       polyps were 3 to 4 mm in size. These polyps were removed with a cold       snare. Resection and retrieval were complete. Estimated blood loss was        minimal.      Non-bleeding internal hemorrhoids were found during retroflexion. The       hemorrhoids were Grade I (internal hemorrhoids that do not prolapse).      Multiple small-mouthed diverticula were found in the sigmoid colon.      The exam was otherwise without abnormality on direct and retroflexion       views. Impression:            - Melanosis in the colon.                        - Two 3 to 4 mm polyps in the descending colon,                         removed with a cold snare. Resected and retrieved.                        - Non-bleeding internal hemorrhoids.                        - Diverticulosis in the sigmoid colon.                        - The examination was otherwise normal on direct and                         retroflexion views. Recommendation:        - Discharge patient to home.                        - Resume previous diet.                        - Continue present medications.                        - Await pathology results.                        - Repeat colonoscopy in 6 months because the bowel  preparation was suboptimal.                        - Return to referring physician as previously                         scheduled. Procedure Code(s):     --- Professional ---                        [phone], Colonoscopy, flexible; with removal of                         tumor(s), polyp(s), or other lesion(s) by snare                         technique Diagnosis Code(s):     --- Professional ---                        Z12.11, Encounter for screening for malignant neoplasm                         of colon                        K63.89, Other specified diseases of intestine                        K63.5, Polyp of colon                        K64.0, First degree hemorrhoids                        K57.30, Diverticulosis of large intestine without                         perforation or abscess without bleeding CPT copyright 2019 American Medical  Association. All rights reserved. The codes documented in this report are preliminary and upon coder review may  be revised to meet current compliance requirements. Andrey Farmer MD, MD 12/05/2020 10:14:21 AM Number of Addenda: 0 Note Initiated On: 12/05/2020 9:24 AM Scope Withdrawal Time: 0 hours 7 minutes 41 seconds  Total Procedure Duration: 0 hours 19 minutes 47 seconds  Estimated Blood Loss:  Estimated blood loss was minimal.      [facility]

## 2020-12-05 NOTE — H&P (Signed)
Outpatient short stay form Pre-procedure 12/05/2020 9:23 AM Jeremiah Miyamoto MD, MPH  Primary Physician: FNP Lavena Bullion  Reason for visit:  Screening colonoscopy  History of present illness:   Jeremiah Nguyen is a 65 y/o gentleman with some developmental delay, hx of seizures, and history of tobacco/alcohol use here for index screening colonoscopy. Patient presented with cargiver. Risks/benefits relayed to both patient and caregiver. Patient makes own decisions and agreed to proceed with procedure.    Current Facility-Administered Medications:    0.9 %  sodium chloride infusion, , Intravenous, Continuous, Undra Harriman, Hilton Cork, MD  Medications Prior to Admission  Medication Sig Dispense Refill Last Dose   aspirin 81 MG chewable tablet Chew by mouth daily.   12/04/2020   baclofen (LIORESAL) 10 MG tablet TAKE ONE TABLET BY MOUTH FOUR TIMES A DAY 120 each 0 12/04/2020   buPROPion (WELLBUTRIN) 75 MG tablet Take 1 tablet (75 mg total) by mouth daily. 30 tablet 1 12/04/2020   chlorproMAZINE (THORAZINE) 50 MG tablet TAKE ONE TABLET BY MOUTH FOUR TIMES A DAY 120 tablet 0 12/04/2020   lamoTRIgine (LAMICTAL) 25 MG tablet Take by mouth.   12/04/2020   losartan (COZAAR) 25 MG tablet Take 25 mg by mouth daily.   12/04/2020   metoprolol succinate (TOPROL-XL) 25 MG 24 hr tablet Take 25 mg by mouth 2 (two) times daily.   12/04/2020   mirtazapine (REMERON) 15 MG tablet TAKE 1 TABLET BY MOUTH AT BEDTIME 20 tablet 0 12/04/2020   omeprazole (PRILOSEC) 40 MG capsule TAKE ONE CAPSULE BY MOUTH EVERY DAY *DO NOT CRUSH* 30 capsule 11 12/04/2020   PHENObarbital (LUMINAL) 60 MG tablet Take 1 tablet (60 mg total) by mouth at bedtime. 30 tablet 0 12/04/2020   phenytoin (DILANTIN) 100 MG ER capsule TAKE 2 CAPSULES BY MOUTH EVERY MORNING *DO NOT CRUSH OR CHEW* *HAZARDOUS DRUG: WEAR GLOVES*;TAKE 3 CAPSULES BY MOUTH EVERY EVENING *DO NOT CRUSH OR CHEW* *HAZARDOUS DRUG: WEAR GLOVES* 147 capsule 10 12/04/2020   potassium chloride SA (KLOR-CON) 20  MEQ tablet TAKE 1 TABLET BY MOUTH ONCE DAILY *DO NOT CRUSH* 30 tablet 0 12/04/2020   sertraline (ZOLOFT) 25 MG tablet Take 1 tablet (25 mg total) by mouth daily. 30 tablet 1 12/04/2020   traZODone (DESYREL) 100 MG tablet TAKE ONE TABLET BY MOUTH AT BEDTIME 30 tablet 0 12/04/2020   NON-ASPIRIN PAIN RELIEVER 325 MG tablet TAKE ONE TABLET BY MOUTH EVERY 4 HOURS AS NEEDED 60 tablet 3    Potassium Chloride ER 20 MEQ TBCR Take 20 mEq by mouth daily. 30 tablet 5    SENNA-PLUS 8.6-50 MG tablet TAKE 1 TABLET  BY MOUTH TWICE DAILY 60 tablet 10      No Known Allergies   Past Medical History:  Diagnosis Date   COPD (chronic obstructive pulmonary disease) (HCC)    Eating disorder    GERD (gastroesophageal reflux disease)    Liver disease    Seizures ([place])    Sleep apnea     Review of systems:  Otherwise negative.    Physical Exam  Gen: Alert, oriented. Appears stated age.  HEENT: PERRLA. Lungs: No respiratory distress CV: RRR Abd: soft, benign, no masses Ext: No edema    Planned procedures: Proceed with colonoscopy. The patient understands the nature of the planned procedure, indications, risks, alternatives and potential complications including but not limited to bleeding, infection, perforation, damage to internal organs and possible oversedation/side effects from anesthesia. The patient agrees and gives consent to proceed.  Please refer to  procedure notes for findings, recommendations and patient disposition/instructions.     Jeremiah Miyamoto MD, MPH Gastroenterology 12/05/2020  9:23 AM

## 2020-12-05 NOTE — Transfer of Care (Signed)
Immediate Anesthesia Transfer of Care Note  [name]  Procedure(s) Performed: COLONOSCOPY WITH PROPOFOL  Patient Location: PACU  Anesthesia Type:General  Level of Consciousness: sedated  Airway & Oxygen Therapy: Patient Spontanous Breathing and Patient connected to face mask oxygen  Post-op Assessment: Report given to RN and Post -op Vital signs reviewed and stable  Post vital signs: Reviewed and stable  Last Vitals:  Vitals Value Taken Time  BP 118/77 12/05/20 1016  Temp    Pulse 83 12/05/20 1017  Resp 32 12/05/20 1017  SpO2 97 % 12/05/20 1017  Vitals shown include unvalidated device data.  Last Pain:  Vitals:   12/05/20 0906  TempSrc: Temporal  PainSc: 0-No pain         Complications: No notable events documented.

## 2020-12-06 ENCOUNTER — Encounter: Payer: Self-pay | Admitting: Gastroenterology

## 2020-12-06 LAB — SURGICAL PATHOLOGY

## 2021-03-09 ENCOUNTER — Ambulatory Visit: Payer: Medicare Other | Admitting: Podiatry

## 2021-05-30 ENCOUNTER — Other Ambulatory Visit: Payer: Self-pay | Admitting: *Deleted

## 2021-05-30 DIAGNOSIS — Z87891 Personal history of nicotine dependence: Secondary | ICD-10-CM

## 2021-05-30 DIAGNOSIS — F17210 Nicotine dependence, cigarettes, uncomplicated: Secondary | ICD-10-CM

## 2021-06-08 ENCOUNTER — Emergency Department: Payer: Medicare Other

## 2021-06-08 ENCOUNTER — Other Ambulatory Visit: Payer: Self-pay

## 2021-06-08 ENCOUNTER — Emergency Department
Admission: EM | Admit: 2021-06-08 | Discharge: 2021-06-08 | Payer: Medicare Other | Attending: Emergency Medicine | Admitting: Emergency Medicine

## 2021-06-08 DIAGNOSIS — R55 Syncope and collapse: Secondary | ICD-10-CM | POA: Diagnosis not present

## 2021-06-08 DIAGNOSIS — G40909 Epilepsy, unspecified, not intractable, without status epilepticus: Secondary | ICD-10-CM | POA: Diagnosis not present

## 2021-06-08 DIAGNOSIS — J449 Chronic obstructive pulmonary disease, unspecified: Secondary | ICD-10-CM | POA: Diagnosis not present

## 2021-06-08 DIAGNOSIS — R410 Disorientation, unspecified: Secondary | ICD-10-CM | POA: Insufficient documentation

## 2021-06-08 DIAGNOSIS — Z20822 Contact with and (suspected) exposure to covid-19: Secondary | ICD-10-CM | POA: Diagnosis not present

## 2021-06-08 LAB — COMPREHENSIVE METABOLIC PANEL
ALT: 21 U/L (ref 0–44)
AST: 24 U/L (ref 15–41)
Albumin: 4.1 g/dL (ref 3.5–5.0)
Alkaline Phosphatase: 91 U/L (ref 38–126)
Anion gap: 7 (ref 5–15)
BUN: 11 mg/dL (ref 8–23)
CO2: 28 mmol/L (ref 22–32)
Calcium: 9.5 mg/dL (ref 8.9–10.3)
Chloride: 104 mmol/L (ref 98–111)
Creatinine, Ser: 0.95 mg/dL (ref 0.61–1.24)
GFR, Estimated: >60 mL/min (ref >60)
Glucose, Bld: 81 mg/dL (ref 70–99)
Potassium: 3.9 mmol/L (ref 3.5–5.1)
Sodium: 139 mmol/L (ref 135–145)
Total Bilirubin: 0.7 mg/dL (ref 0.3–1.2)
Total Protein: 7.5 g/dL (ref 6.5–8.1)

## 2021-06-08 LAB — PHENYTOIN LEVEL, TOTAL: Phenytoin Lvl: <2.5 ug/mL — ABNORMAL LOW (ref 10.0–20.0)

## 2021-06-08 LAB — CBC WITH DIFFERENTIAL/PLATELET
Abs Immature Granulocytes: 0.02 10*3/uL (ref 0.00–0.07)
Basophils Absolute: 0.1 10*3/uL (ref 0.0–0.1)
Basophils Relative: 2 %
Eosinophils Absolute: 0.2 10*3/uL (ref 0.0–0.5)
Eosinophils Relative: 4 %
HCT: 48.2 % (ref 39.0–52.0)
Hemoglobin: 15.9 g/dL (ref 13.0–17.0)
Immature Granulocytes: 0 %
Lymphocytes Relative: 39 %
Lymphs Abs: 2.1 10*3/uL (ref 0.7–4.0)
MCH: 29.9 pg (ref 26.0–34.0)
MCHC: 33 g/dL (ref 30.0–36.0)
MCV: 90.6 fL (ref 80.0–100.0)
Monocytes Absolute: 0.5 10*3/uL (ref 0.1–1.0)
Monocytes Relative: 9 %
Neutro Abs: 2.6 10*3/uL (ref 1.7–7.7)
Neutrophils Relative %: 46 %
Platelets: 239 10*3/uL (ref 150–400)
RBC: 5.32 MIL/uL (ref 4.22–5.81)
RDW: 15.8 % — ABNORMAL HIGH (ref 11.5–15.5)
WBC: 5.5 10*3/uL (ref 4.0–10.5)
nRBC: 0 % (ref 0.0–0.2)

## 2021-06-08 LAB — RESP PANEL BY RT-PCR (FLU A&B, COVID) ARPGX2
Influenza A by PCR: NEGATIVE
Influenza B by PCR: NEGATIVE
SARS Coronavirus 2 by RT PCR: NEGATIVE

## 2021-06-08 LAB — TROPONIN I (HIGH SENSITIVITY): Troponin I (High Sensitivity): 13 ng/L (ref <18)

## 2021-06-08 LAB — ETHANOL: Alcohol, Ethyl (B): <10 mg/dL (ref <10)

## 2021-06-08 LAB — PROTIME-INR
INR: 1.1 (ref 0.8–1.2)
Prothrombin Time: 13.7 seconds (ref 11.4–15.2)

## 2021-06-08 NOTE — ED Triage Notes (Signed)
Pt states he is unsure why he is here. Denies pain. Oriented to time and place.  States jarod McLain brought him here, this name listed in emergency contact. This RN attempted to call emergency contact at number listed, no answer, VM left.   Pt states he might be here because they could not draw blood out of his hand, does not know who they is.  No visits noted in chart or care everywhere.   Pt appears to be from [facility]

## 2021-06-08 NOTE — Discharge Instructions (Signed)
Please follow-up with your cardiologist for consideration of a Holter monitor.  Return to the emergency department for any further syncopal episodes (passing out), or any other symptom personally concerning to yourself.  Please inform your primary care doctor of today's ER visit.

## 2021-06-08 NOTE — ED Provider Triage Note (Signed)
Emergency Medicine Provider Triage Evaluation Note  Jeremiah Nguyen , a 66 y.o. male  was evaluated in triage.  Pt is unsure why he is here. Caregiver reports frequent syncopal episodes over the past couple of days. No known head injury. Patient oriented to person, place, and time but unsure why he was sent to the ER.  Review of Systems  Positive: Syncopal episodes Negative: Headache, chest pain, shortness of breath  Physical Exam  There were no vitals taken for this visit. Gen:   Awake, no distress   Resp:  Normal effort  MSK:   Moves extremities without difficulty  Other:    Medical Decision Making  Medically screening exam initiated at 3:57 PM.  Appropriate orders placed.  Jeremiah Nguyen was informed that the remainder of the evaluation will be completed by another provider, this initial triage assessment does not replace that evaluation, and the importance of remaining in the ED until their evaluation is complete.   Victorino Dike, FNP 06/08/21 1644

## 2021-06-08 NOTE — ED Notes (Signed)
Group Home left contact information in case pt. Needs to be picked up [phone] Presley Raddle

## 2021-06-08 NOTE — ED Notes (Signed)
Renetta Chalk- caregiver/cousin called RN back. States pt had been "passing out" and pt had to be caught from falling. PCP advised to bring to ER. States was unable to get blood work at cardiology on Friday d/t hard stick.

## 2021-06-08 NOTE — ED Provider Notes (Signed)
[facility] Provider Note    Event Date/Time   First MD Initiated Contact with Patient 06/08/21 2208     (approximate)  History   Chief Complaint: evaluation  HPI  Jeremiah Nguyen is a 66 y.o. male with a past medical history of a seizure disorder, COPD presents to the emergency department for evaluation.  Patient is not sure why he is here, states Jeremiah Nguyen from his group home sent him here to be evaluated they did not know why.  Patient denies any complaints.  Specifically no chest pain shortness of breath fever cough congestion no abdominal pain.  Patient states he is ready go home and is asking to be discharged.  I spoke to Jeremiah Nguyen from the group home he states last week as well as today patient had a near syncopal episode where he almost passed out.  He states he just heard about this today so he had him come to the emergency department for an evaluation.  Physical Exam   Triage Vital Signs: ED Triage Vitals  Enc Vitals Group     BP 06/08/21 1557 124/78     Pulse Rate 06/08/21 1557 78     Resp 06/08/21 1557 18     Temp 06/08/21 1557 98.7 F (37.1 C)     Temp Source 06/08/21 1557 Oral     SpO2 06/08/21 1557 90 %     Weight 06/08/21 1602 166 lb (75.3 kg)     Height 06/08/21 1602 5\' 8"  (1.727 m)     Head Circumference --      Peak Flow --      Pain Score 06/08/21 1601 0     Pain Loc --      Pain Edu? --      Excl. in [place]? --     Most recent vital signs: Vitals:   06/08/21 1557  BP: 124/78  Pulse: 78  Resp: 18  Temp: 98.7 F (37.1 C)  SpO2: 90%    General: Awake, no distress.  CV:  Good peripheral perfusion.  Regular rate and rhythm  Resp:  Normal effort.  Equal breath sounds bilaterally.  Abd:  No distention.  Soft, nontender.  No rebound or guarding.    ED Results / Procedures / Treatments   EKG  EKG viewed and interpreted by myself shows a normal sinus rhythm at 74 bpm with a narrow QRS, normal axis, normal intervals,  no concerning ST changes.  No salvation.  RADIOLOGY  I have personally reviewed the chest x-ray images.  No concerning abnormality seen in my evaluation. Radiology has read the chest x-ray is negative for acute abnormality.   MEDICATIONS ORDERED IN ED: Medications - No data to display   IMPRESSION / MDM / [place] / ED COURSE  I reviewed the triage vital signs and the nursing notes.  Patient presents emergency department for evaluation of 2 near syncopal episodes over the past 1 week.  Patient goes to a day program and they state twice now once last week and once today the patient had a brief near syncopal episode which she had to be caught or else he would have fallen.  Patient does not recall events.  Does not know why he is here.  Patient is unable to give a history.  I spoke to Jeremiah Nguyen on the phone regarding these events.  Also discussed the patient's work-up, overall reassuring work-up.  Normal labs including normal troponin, normal chemistry normal renal  function.  Chest x-ray is normal EKG is reassuring.  We will check a COVID swab as a precaution.  I discussed with Jeremiah Nguyen patient should follow-up with his cardiologist which she already has for consideration of a Holter monitor.  He is agreeable to this plan of care.  Patient will be discharged from the emergency department with outpatient follow-up.  FINAL CLINICAL IMPRESSION(S) / ED DIAGNOSES   Near syncope  Rx / DC Orders   Cardiology follow-up for Holter monitor consideration  Note:  This document was prepared using Dragon voice recognition software and may include unintentional dictation errors.   Harvest Dark, MD 06/08/21 2217

## 2021-06-21 ENCOUNTER — Encounter: Payer: Self-pay | Admitting: Acute Care

## 2021-06-21 ENCOUNTER — Other Ambulatory Visit: Payer: Self-pay

## 2021-06-21 ENCOUNTER — Ambulatory Visit (INDEPENDENT_AMBULATORY_CARE_PROVIDER_SITE_OTHER): Payer: Medicare Other | Admitting: Acute Care

## 2021-06-21 DIAGNOSIS — F17210 Nicotine dependence, cigarettes, uncomplicated: Secondary | ICD-10-CM

## 2021-06-21 NOTE — Patient Instructions (Signed)
Thank you for participating in the [facility]. °It was our pleasure to meet you today. °We will call you with the results of your scan within the next few days. °Your scan will be assigned a Lung RADS category score by the physicians reading the scans.  °This Lung RADS score determines follow up scanning.  °See below for description of categories, and follow up screening recommendations. °We will be in touch to schedule your follow up screening annually or based on recommendations of our providers. °We will fax a copy of your scan results to your Primary Care Physician, or the physician who referred you to the program, to ensure they have the results. °Please call the office if you have any questions or concerns regarding your scanning experience or results.  °Our office number is [phone]. °Please speak with Denise Phelps, RN. She is our Lung Cancer Screening RN. °If she is unavailable when you call, please have the office staff send her a message. She will return your call at her earliest convenience. °Remember, if your scan is normal, we will scan you annually as long as you continue to meet the criteria for the program. (Age 55-77, Current smoker or smoker who has quit within the last 15 years). °If you are a smoker, remember, quitting is the single most powerful action that you can take to decrease your risk of lung cancer and other pulmonary, breathing related problems. °We know quitting is hard, and we are here to help.  °Please let us know if there is anything we can do to help you meet your goal of quitting. °If you are a former smoker, congratulations. We are proud of you! Remain smoke free! °Remember you can refer friends or family members through the number above.  °We will screen them to make sure they meet criteria for the program. °Thank you for helping us take better care of you by participating in Lung Screening. ° °You can receive free nicotine replacement therapy  ( patches, gum or mints) by calling 1-800-QUIT NOW. Please call so we can get you on the path to becoming  a non-smoker. I know it is hard, but you can do this! ° °Lung RADS Categories: ° °Lung RADS 1: no nodules or definitely non-concerning nodules.  °Recommendation is for a repeat annual scan in 12 months. ° °Lung RADS 2:  nodules that are non-concerning in appearance and behavior with a very low likelihood of becoming an active cancer. °Recommendation is for a repeat annual scan in 12 months. ° °Lung RADS 3: nodules that are probably non-concerning , includes nodules with a low likelihood of becoming an active cancer.  Recommendation is for a 6-month repeat screening scan. Often noted after an upper respiratory illness. We will be in touch to make sure you have no questions, and to schedule your 6-month scan. ° °Lung RADS 4 A: nodules with concerning findings, recommendation is most often for a follow up scan in 3 months or additional testing based on our provider's assessment of the scan. We will be in touch to make sure you have no questions and to schedule the recommended 3 month follow up scan. ° °Lung RADS 4 B:  indicates findings that are concerning. We will be in touch with you to schedule additional diagnostic testing based on our provider's  assessment of the scan. ° °Hypnosis for smoking cessation  °[employer] °[phone] ° °Acupuncture for smoking cessation  °[facility] °[phone]  °

## 2021-06-21 NOTE — Progress Notes (Addendum)
Shared Decision Making Visit Lung Cancer Screening Program [phone])   Eligibility: Age 66 y.o. Pack Years Smoking History Calculation 25 pack year smoking history (# packs/per year x # years smoked) Recent History of coughing up blood  no Unexplained weight loss? no ( >Than 15 pounds within the last 6 months ) Prior History Lung / other cancer no (Diagnosis within the last 5 years already requiring surveillance chest CT Scans). Smoking Status Current Smoker Former Smokers: Years since quit:  NA  Quit Date:  NA  Visit Components: Discussion included one or more decision making aids. yes Discussion included risk/benefits of screening. yes Discussion included potential follow up diagnostic testing for abnormal scans. yes Discussion included meaning and risk of over diagnosis. yes Discussion included meaning and risk of False Positives. yes Discussion included meaning of total radiation exposure. yes  Counseling Included: Importance of adherence to annual lung cancer LDCT screening. yes Impact of comorbidities on ability to participate in the program. yes Ability and willingness to under diagnostic treatment. yes  Smoking Cessation Counseling: Current Smokers:  Discussed importance of smoking cessation. yes Information about tobacco cessation classes and interventions provided to patient. yes Patient provided with "ticket" for LDCT Scan. yes Symptomatic Patient. no  Counseling NA Diagnosis Code: Tobacco Use Z72.0 Asymptomatic Patient yes  Counseling (Intermediate counseling: > three minutes counseling) [id number] Former Smokers:  Discussed the importance of maintaining cigarette abstinence. yes Diagnosis Code: Personal History of Nicotine Dependence. T51.761 Information about tobacco cessation classes and interventions provided to patient. Yes Patient provided with "ticket" for LDCT Scan. yes Written Order for Lung Cancer Screening with LDCT placed in Epic. Yes (CT Chest Lung  Cancer Screening Low Dose W/O CM) [id number] Z12.2-Screening of respiratory organs Z87.891-Personal history of nicotine dependence  I have spent 25 minutes of face to face/ virtual visit   time with  Mr. Covello and his caregiver discussing the risks and benefits of lung cancer screening. We viewed / discussed a power point together that explained in detail the above noted topics. We paused at intervals to allow for questions to be asked and answered to ensure understanding.We discussed that the single most powerful action that he can take to decrease his risk of developing lung cancer is to quit smoking. We discussed whether or not he is ready to commit to setting a quit date. We discussed options for tools to aid in quitting smoking including nicotine replacement therapy, non-nicotine medications, support groups, Quit Smart classes, and behavior modification. We discussed that often times setting smaller, more achievable goals, such as eliminating 1 cigarette a day for a week and then 2 cigarettes a day for a week can be helpful in slowly decreasing the number of cigarettes smoked. This allows for a sense of accomplishment as well as providing a clinical benefit. I provided  him  with smoking cessation  information  with contact information for community resources, classes, free nicotine replacement therapy, and access to mobile apps, text messaging, and on-line smoking cessation help. I have also provided  him  the office contact information in the event he needs to contact me, or the screening staff. We discussed the time and location of the scan, and that either Doroteo Glassman RN, Joella Prince, RN  or I will call / send a letter with the results within 24-72 hours of receiving them. The patient verbalized understanding of all of  the above and had no further questions upon leaving the office. They have my contact information in the event  they have any further questions.  I spent 3 minutes counseling on smoking  cessation and the health risks of continued tobacco abuse.  I explained to the patient that there has been a high incidence of coronary artery disease noted on these exams. I explained that this is a non-gated exam therefore degree or severity cannot be determined. This patient is not on statin therapy. I have asked the patient to follow-up with their PCP regarding any incidental finding of coronary artery disease and management with diet or medication as their PCP  feels is clinically indicated. The patient verbalized understanding of the above and had no further questions upon completion of the visit.    I spent 30 minutes dedicated to the care of this patient on the date of this encounter to include pre-visit review of records, face-to-face time with the patient discussing conditions above, post visit ordering of testing, clinical documentation with the electronic health record, making appropriate referrals as documented, and communicating necessary information to the patient's healthcare team.   This is a G0296 visit. Not a time based visit. It is a no charge visit and charge capture of G0296  Magdalen Spatz, NP 06/21/2021

## 2021-06-26 ENCOUNTER — Other Ambulatory Visit: Payer: Self-pay

## 2021-06-26 ENCOUNTER — Ambulatory Visit
Admission: RE | Admit: 2021-06-26 | Discharge: 2021-06-26 | Disposition: A | Discharge status: Home / Self Care | Payer: Medicare Other | Source: Ambulatory Visit | Attending: Acute Care | Admitting: Acute Care

## 2021-06-26 DIAGNOSIS — Z87891 Personal history of nicotine dependence: Secondary | ICD-10-CM | POA: Insufficient documentation

## 2021-06-26 DIAGNOSIS — F17210 Nicotine dependence, cigarettes, uncomplicated: Secondary | ICD-10-CM | POA: Insufficient documentation

## 2021-07-19 ENCOUNTER — Telehealth: Payer: Self-pay | Admitting: Acute Care

## 2021-07-19 DIAGNOSIS — Z87891 Personal history of nicotine dependence: Secondary | ICD-10-CM

## 2021-07-19 DIAGNOSIS — F17210 Nicotine dependence, cigarettes, uncomplicated: Secondary | ICD-10-CM

## 2021-07-19 NOTE — Telephone Encounter (Signed)
This patient has suspected dementia and lives in a group home. ( I do not see it listed as a medical diagnosis). He was not reachable by phone. The group home manager took the call but does not have a documented DPR. I did not share any healthcare information with Coralyn Mark the manager at the group home, but I did let her know the patient needed a 3 month follow up CT Chest and that we would be calling to get this scheduled closer to the time, as she arranges all of his transportation . She verbalized understanding.  ?Langley Gauss, please place order for a 3 month follow up low dose Ct Chest in 3 months from 06/27/2021 scan and Fax results to the PCP's office Thanks so much. ?Call the group home to schedule follow up. I will try to call the patient again tomorrow, as he is asleep at present. Thanks ?

## 2021-07-19 NOTE — Telephone Encounter (Signed)
Ct results faxed to PCP with f/u plans included. Order placed for 3 mth nodule f/u CT. 

## 2021-08-31 ENCOUNTER — Ambulatory Visit (INDEPENDENT_AMBULATORY_CARE_PROVIDER_SITE_OTHER): Payer: Medicare Other | Admitting: Podiatry

## 2021-08-31 ENCOUNTER — Encounter: Payer: Self-pay | Admitting: Podiatry

## 2021-08-31 DIAGNOSIS — M79674 Pain in right toe(s): Secondary | ICD-10-CM | POA: Diagnosis not present

## 2021-08-31 DIAGNOSIS — B351 Tinea unguium: Secondary | ICD-10-CM | POA: Diagnosis not present

## 2021-08-31 DIAGNOSIS — M79675 Pain in left toe(s): Secondary | ICD-10-CM

## 2021-08-31 NOTE — Progress Notes (Signed)
This patient presents  to the office for evaluation and treatment of long thick painful nails .  This patient is unable to trim his own nails since the patient cannot reach his feet.  Patient says the nails are painful walking and wearing his shoes. He is brought to the office by a caregiver.  He returns for preventive foot care services.  General Appearance  Alert, conversant and in no acute stress.  Vascular  Dorsalis pedis and posterior tibial  pulses are not  palpable  bilaterally.  Capillary return is within normal limits  bilaterally. Temperature is within normal limits  bilaterally.  Neurologic  Senn-Weinstein monofilament wire test within normal limits  bilaterally. Muscle power within normal limits bilaterally.  Nails Thick disfigured discolored nails with subungual debris  from hallux to fifth toes bilaterally. No evidence of bacterial infection or drainage bilaterally.  Orthopedic  No limitations of motion  feet .  No crepitus or effusions noted.  HAV  B/L.  Hammer toes  B/L.  Skin  normotropic skin with no porokeratosis noted bilaterally.  No signs of infections or ulcers noted.     Onychomycosis  Pain in toes right foot  Pain in toes left foot  Debridement  of nails  1-5  B/L with a nail nipper.  Nails were then filed using a dremel tool with no incidents.    RTC  3 months   Veer Elamin DPM  

## 2021-10-04 ENCOUNTER — Ambulatory Visit
Admission: RE | Admit: 2021-10-04 | Discharge: 2021-10-04 | Disposition: A | Discharge status: Home / Self Care | Payer: Medicare Other | Source: Ambulatory Visit | Attending: Acute Care | Admitting: Acute Care

## 2021-10-04 ENCOUNTER — Ambulatory Visit: Payer: Medicare Other

## 2021-10-04 DIAGNOSIS — F17210 Nicotine dependence, cigarettes, uncomplicated: Secondary | ICD-10-CM | POA: Diagnosis present

## 2021-10-04 DIAGNOSIS — Z87891 Personal history of nicotine dependence: Secondary | ICD-10-CM | POA: Insufficient documentation

## 2021-10-05 ENCOUNTER — Telehealth: Payer: Self-pay | Admitting: Acute Care

## 2021-10-05 NOTE — Telephone Encounter (Signed)
Concur with this assessment.

## 2021-10-05 NOTE — Telephone Encounter (Signed)
IMPRESSION: 1. Lung-RADS 4B, suspicious. Additional imaging evaluation or consultation with Pulmonology or Thoracic Surgery recommended. Overall waxing and waning of bilateral pulmonary nodules. New irregular 9 mm nodular opacity in the lingula. Given the relatively rapid interval appearance, this is likely infectious/inflammatory but meets size criteria for lung rads 4B. 2. Aortic Atherosclerosis (ICD10-I70.0) and Emphysema (ICD10-J43.9).   These results will be called to the ordering clinician or representative by the Radiologist Assistant, and communication documented in the PACS or [employer].   Electronically Signed: By: Misty Stanley M.D. On: 10/05/2021 07:59    ADDENDUM REPORT: 10/05/2021 08:07   ADDENDUM: As noted in the body of the report, the esophagus is dilated and filled with fluid and food debris. The stomach is also distended with food and fluid. Appearance of the esophagus may be related to dysmotility and reflux although given the amount of food debris in the esophagus, distal esophageal stricture is considered more likely. Barium swallow likely prove helpful to further evaluate.     Electronically Signed   By: Misty Stanley M.D.   On: 10/05/2021 08:07

## 2021-10-05 NOTE — Telephone Encounter (Signed)
I have reviewed this scan with Dr. Patsey Berthold. The scan was read as a Lung RADS 4 B indicates suspicious findings for which additional diagnostic testing and or tissue sampling is recommended.  The scan shows what looks like chronic aspiration into the lungs. The esophagus is  dilated and fluid/ food  filled. He has achalasia of the esophagus. He has GI issues that are causing his aspiration. He has end stage COPD and is high risk for sedation required for procedures that are needed for tissue diagnosis and treatment. One of the criteria for lung cancer screening is that patient's are healthy enough to undergo biopsies, surgery and treatment if cancer were to be suspected. Jeremiah Nguyen does not meet this criteria.  I have called is PCP, Jeremiah Alpha, FNP's office . I spoke with her nurse Jeremiah Nguyen, and explained that the patient has chronic aspiration and what appears to be esophageal achalasia. I explained that he needs to be treated for the aspiration and also needs a GI consult to have the esophagus evaluated. He is high risk for sedation for evaluation of his esophageal stricture also. I also explained that he does not meet criteria for screening at this point, as he is not healthy enough to undergo sedation for biopsies ,  surgery or  treatment if cancer were to be suspected. Jeremiah Nguyen requested that we send a copy of the scan to the office for them to review. Jeremiah B. Please fax a copy of the scan to [phone]. We will remove the patient from the screening program, as I have notified the PCP that he no longer meets criteria, based on inability to tolerate procedures necessary to diagnose and treat lung cancer. Thanks so much

## 2021-10-05 NOTE — Telephone Encounter (Signed)
Results of LDCT have been faxed to [phone] attention Threasa Alpha, FNP.

## 2022-01-04 ENCOUNTER — Ambulatory Visit (INDEPENDENT_AMBULATORY_CARE_PROVIDER_SITE_OTHER): Payer: Medicare Other | Admitting: Podiatry

## 2022-01-04 ENCOUNTER — Encounter: Payer: Self-pay | Admitting: Podiatry

## 2022-01-04 DIAGNOSIS — B351 Tinea unguium: Secondary | ICD-10-CM

## 2022-01-04 DIAGNOSIS — M79674 Pain in right toe(s): Secondary | ICD-10-CM

## 2022-01-04 DIAGNOSIS — M79675 Pain in left toe(s): Secondary | ICD-10-CM

## 2022-01-04 NOTE — Progress Notes (Signed)
This patient presents  to the office for evaluation and treatment of long thick painful nails .  This patient is unable to trim his own nails since the patient cannot reach his feet.  Patient says the nails are painful walking and wearing his shoes. He is brought to the office by a caregiver.  He returns for preventive foot care services.  General Appearance  Alert, conversant and in no acute stress.  Vascular  Dorsalis pedis and posterior tibial  pulses are not  palpable  bilaterally.  Capillary return is within normal limits  bilaterally. Temperature is within normal limits  bilaterally.  Neurologic  Senn-Weinstein monofilament wire test within normal limits  bilaterally. Muscle power within normal limits bilaterally.  Nails Thick disfigured discolored nails with subungual debris  from hallux to fifth toes bilaterally. No evidence of bacterial infection or drainage bilaterally.  Orthopedic  No limitations of motion  feet .  No crepitus or effusions noted.  HAV  B/L.  Hammer toes  B/L.  Skin  normotropic skin with no porokeratosis noted bilaterally.  No signs of infections or ulcers noted.     Onychomycosis  Pain in toes right foot  Pain in toes left foot  Debridement  of nails  1-5  B/L with a nail nipper.  Nails were then filed using a dremel tool with no incidents.    RTC  3 months   Jerardo Costabile DPM  

## 2022-04-19 ENCOUNTER — Ambulatory Visit (INDEPENDENT_AMBULATORY_CARE_PROVIDER_SITE_OTHER): Payer: Medicare Other | Admitting: Podiatry

## 2022-04-19 ENCOUNTER — Encounter: Payer: Self-pay | Admitting: Podiatry

## 2022-04-19 DIAGNOSIS — M79675 Pain in left toe(s): Secondary | ICD-10-CM

## 2022-04-19 DIAGNOSIS — B351 Tinea unguium: Secondary | ICD-10-CM

## 2022-04-19 DIAGNOSIS — M79674 Pain in right toe(s): Secondary | ICD-10-CM | POA: Diagnosis not present

## 2022-04-19 NOTE — Progress Notes (Signed)
This patient presents  to the office for evaluation and treatment of long thick painful nails .  This patient is unable to trim his own nails since the patient cannot reach his feet.  Patient says the nails are painful walking and wearing his shoes. He is brought to the office by a caregiver.  He returns for preventive foot care services.  General Appearance  Alert, conversant and in no acute stress.  Vascular  Dorsalis pedis and posterior tibial  pulses are not  palpable  bilaterally.  Capillary return is within normal limits  bilaterally. Temperature is within normal limits  bilaterally.  Neurologic  Senn-Weinstein monofilament wire test within normal limits  bilaterally. Muscle power within normal limits bilaterally.  Nails Thick disfigured discolored nails with subungual debris  from hallux to fifth toes bilaterally. No evidence of bacterial infection or drainage bilaterally.  Orthopedic  No limitations of motion  feet .  No crepitus or effusions noted.  HAV  B/L.  Hammer toes  B/L.  Skin  normotropic skin with no porokeratosis noted bilaterally.  No signs of infections or ulcers noted.     Onychomycosis  Pain in toes right foot  Pain in toes left foot  Debridement  of nails  1-5  B/L with a nail nipper.  Nails were then filed using a dremel tool with no incidents.    RTC  3 months   Jeremiah Nguyen DPM  

## 2022-07-19 ENCOUNTER — Encounter: Payer: Self-pay | Admitting: Podiatry

## 2022-07-19 ENCOUNTER — Ambulatory Visit (INDEPENDENT_AMBULATORY_CARE_PROVIDER_SITE_OTHER): Payer: Medicare Other | Admitting: Podiatry

## 2022-07-19 VITALS — BP 137/62 | HR 99

## 2022-07-19 DIAGNOSIS — M79674 Pain in right toe(s): Secondary | ICD-10-CM

## 2022-07-19 DIAGNOSIS — B351 Tinea unguium: Secondary | ICD-10-CM

## 2022-07-19 DIAGNOSIS — M79675 Pain in left toe(s): Secondary | ICD-10-CM

## 2022-07-19 NOTE — Progress Notes (Signed)
This patient presents  to the office for evaluation and treatment of long thick painful nails .  This patient is unable to trim his own nails since the patient cannot reach his feet.  Patient says the nails are painful walking and wearing his shoes. He is brought to the office by a caregiver.  He returns for preventive foot care services.  General Appearance  Alert, conversant and in no acute stress.  Vascular  Dorsalis pedis and posterior tibial  pulses are not  palpable  bilaterally.  Capillary return is within normal limits  bilaterally. Temperature is within normal limits  bilaterally.  Neurologic  Senn-Weinstein monofilament wire test within normal limits  bilaterally. Muscle power within normal limits bilaterally.  Nails Thick disfigured discolored nails with subungual debris  from hallux to fifth toes bilaterally. No evidence of bacterial infection or drainage bilaterally.  Orthopedic  No limitations of motion  feet .  No crepitus or effusions noted.  HAV  B/L.  Hammer toes  B/L.  Skin  normotropic skin with no porokeratosis noted bilaterally.  No signs of infections or ulcers noted.     Onychomycosis  Pain in toes right foot  Pain in toes left foot  Debridement  of nails  1-5  B/L with a nail nipper.  Nails were then filed using a dremel tool with no incidents.    RTC  3 months   Gardiner Barefoot DPM

## 2022-07-26 ENCOUNTER — Emergency Department: Payer: Medicare Other

## 2022-07-26 ENCOUNTER — Emergency Department
Admission: EM | Admit: 2022-07-26 | Discharge: 2022-07-26 | Disposition: A | Discharge status: Home / Self Care | Payer: Medicare Other | Attending: Student in an Organized Health Care Education/Training Program | Admitting: Student in an Organized Health Care Education/Training Program

## 2022-07-26 ENCOUNTER — Other Ambulatory Visit: Payer: Self-pay

## 2022-07-26 DIAGNOSIS — R600 Localized edema: Secondary | ICD-10-CM | POA: Insufficient documentation

## 2022-07-26 DIAGNOSIS — G40909 Epilepsy, unspecified, not intractable, without status epilepticus: Secondary | ICD-10-CM | POA: Diagnosis not present

## 2022-07-26 DIAGNOSIS — J449 Chronic obstructive pulmonary disease, unspecified: Secondary | ICD-10-CM | POA: Insufficient documentation

## 2022-07-26 DIAGNOSIS — R569 Unspecified convulsions: Secondary | ICD-10-CM | POA: Diagnosis present

## 2022-07-26 LAB — BRAIN NATRIURETIC PEPTIDE: B Natriuretic Peptide: 28 pg/mL (ref 0.0–100.0)

## 2022-07-26 LAB — CBC WITH DIFFERENTIAL/PLATELET
Abs Immature Granulocytes: 0.02 10*3/uL (ref 0.00–0.07)
Basophils Absolute: 0.1 10*3/uL (ref 0.0–0.1)
Basophils Relative: 2 %
Eosinophils Absolute: 0.2 10*3/uL (ref 0.0–0.5)
Eosinophils Relative: 3 %
HCT: 42.7 % (ref 39.0–52.0)
Hemoglobin: 13.7 g/dL (ref 13.0–17.0)
Immature Granulocytes: 0 %
Lymphocytes Relative: 36 %
Lymphs Abs: 1.9 10*3/uL (ref 0.7–4.0)
MCH: 28.2 pg (ref 26.0–34.0)
MCHC: 32.1 g/dL (ref 30.0–36.0)
MCV: 88 fL (ref 80.0–100.0)
Monocytes Absolute: 0.6 10*3/uL (ref 0.1–1.0)
Monocytes Relative: 12 %
Neutro Abs: 2.5 10*3/uL (ref 1.7–7.7)
Neutrophils Relative %: 47 %
Platelets: 234 10*3/uL (ref 150–400)
RBC: 4.85 MIL/uL (ref 4.22–5.81)
RDW: 16.2 % — ABNORMAL HIGH (ref 11.5–15.5)
WBC: 5.3 10*3/uL (ref 4.0–10.5)
nRBC: 0 % (ref 0.0–0.2)

## 2022-07-26 LAB — BASIC METABOLIC PANEL
Anion gap: 8 (ref 5–15)
BUN: 10 mg/dL (ref 8–23)
CO2: 28 mmol/L (ref 22–32)
Calcium: 8.6 mg/dL — ABNORMAL LOW (ref 8.9–10.3)
Chloride: 103 mmol/L (ref 98–111)
Creatinine, Ser: 1.02 mg/dL (ref 0.61–1.24)
GFR, Estimated: >60 mL/min (ref >60)
Glucose, Bld: 95 mg/dL (ref 70–99)
Potassium: 3.6 mmol/L (ref 3.5–5.1)
Sodium: 139 mmol/L (ref 135–145)

## 2022-07-26 MED ORDER — IOHEXOL 350 MG/ML SOLN
75.0000 mL | Freq: Once | INTRAVENOUS | Status: AC | PRN
  Administered 2022-07-26: 75 mL via INTRAVENOUS

## 2022-07-26 MED ORDER — IPRATROPIUM-ALBUTEROL 0.5-2.5 (3) MG/3ML IN SOLN
3.0000 mL | Freq: Once | RESPIRATORY_TRACT | Status: AC
  Administered 2022-07-26: 3 mL via RESPIRATORY_TRACT
  Filled 2022-07-26: qty 3

## 2022-07-26 MED ORDER — LEVETIRACETAM IN NACL 1000 MG/100ML IV SOLN
1000.0000 mg | Freq: Once | INTRAVENOUS | Status: AC
  Administered 2022-07-26: 1000 mg via INTRAVENOUS
  Filled 2022-07-26: qty 100

## 2022-07-26 NOTE — ED Notes (Signed)
EKG given to Quentin Cornwall, MD

## 2022-07-26 NOTE — ED Provider Notes (Signed)
Procedures  Clinical Course as of 07/26/22 1712  Thu Jul 26, 2022  1426 Chest x-ray my review and interpretation without evidence of pneumothorax. [PR]  [id number] CTA without evidence of PE on my review and interpretation. [PR]    Clinical Course User Index [PR] Merlyn Lot, MD    ----------------------------------------- 5:12 PM on 07/26/2022 -----------------------------------------  Maintaining SO2 97% on RA. Stable for DC    Carrie Mew, MD 07/26/22 [phone]

## 2022-07-26 NOTE — ED Provider Notes (Signed)
[facility] Provider Note    Event Date/Time   First MD Initiated Contact with Patient 07/26/22 1318     (approximate)   History   Seizures   HPI  Jeremiah Nguyen is a 67 y.o. male history of seizure disorder on phenytoin Lamictal presents to the ER for evaluation of seizure-like episode occurred prior to arrival lasted about 2 minutes with postictal.  States that he has roughly 1 seizure per month.  Denies any pain.  Found to be hypoxic on room air placed on nasal cannula denies any shortness of breath states he does not wear home oxygen.  No vomiting or emesis.  Does have a history of COPD.     Physical Exam   Triage Vital Signs: ED Triage Vitals  Enc Vitals Group     BP 07/26/22 1310 101/63     Pulse Rate 07/26/22 1310 94     Resp 07/26/22 1310 18     Temp 07/26/22 1310 98.5 F (36.9 C)     Temp Source 07/26/22 1310 Oral     SpO2 07/26/22 1310 (!) 88 %     Weight 07/26/22 1315 172 lb (78 kg)     Height 07/26/22 1315 '5\' 8"'$  (1.727 m)     Head Circumference --      Peak Flow --      Pain Score 07/26/22 1318 0     Pain Loc --      Pain Edu? --      Excl. in [place]? --     Most recent vital signs: Vitals:   07/26/22 1557 07/26/22 1600  BP:  117/64  Pulse:  72  Resp:    Temp:    SpO2: 92% 92%     Constitutional: Alert  Eyes: Conjunctivae are normal.  Head: Atraumatic. Nose: No congestion/rhinnorhea. Mouth/Throat: Mucous membranes are moist.   Neck: Painless ROM.  Cardiovascular:   Good peripheral circulation. Respiratory: Normal respiratory effort.  No retractions.  Gastrointestinal: Soft and nontender.  Musculoskeletal:  no deformity Neurologic:  MAE spontaneously. No gross focal neurologic deficits are appreciated.  Skin:  Skin is warm, dry and intact. No rash noted. Psychiatric: Mood and affect are normal. Speech and behavior are normal.    ED Results / Procedures / Treatments   Labs (all labs ordered are listed, but only  abnormal results are displayed) Labs Reviewed  CBC WITH DIFFERENTIAL/PLATELET - Abnormal; Notable for the following components:      Result Value   RDW 16.2 (*)    All other components within normal limits  BASIC METABOLIC PANEL - Abnormal; Notable for the following components:   Calcium 8.6 (*)    All other components within normal limits  BRAIN NATRIURETIC PEPTIDE  LAMOTRIGINE LEVEL  PHENYTOIN LEVEL, FREE AND TOTAL     EKG  ED ECG REPORT I, Merlyn Lot, the attending physician, personally viewed and interpreted this ECG.   Date: 07/26/2022  EKG Time: 13:14  Rate: 85  Rhythm: sinus  Axis: normal  Intervals: normal qt  ST&T Change: no stemi, no depressions    RADIOLOGY Please see ED Course for my review and interpretation.  I personally reviewed all radiographic images ordered to evaluate for the above acute complaints and reviewed radiology reports and findings.  These findings were personally discussed with the patient.  Please see medical record for radiology report.    PROCEDURES:  Critical Care performed: No  Procedures   MEDICATIONS ORDERED IN ED: Medications  ipratropium-albuterol (DUONEB) 0.5-2.5 (3) MG/3ML nebulizer solution 3 mL (has no administration in time range)  levETIRAcetam (KEPPRA) IVPB 1000 mg/100 mL premix (0 mg Intravenous Stopped 07/26/22 1434)  ipratropium-albuterol (DUONEB) 0.5-2.5 (3) MG/3ML nebulizer solution 3 mL (3 mLs Nebulization Given 07/26/22 1503)  iohexol (OMNIPAQUE) 350 MG/ML injection 75 mL (75 mLs Intravenous Contrast Given 07/26/22 1442)     IMPRESSION / MDM / ASSESSMENT AND PLAN / ED COURSE  I reviewed the triage vital signs and the nursing notes.                              Differential diagnosis includes, but is not limited to, Dehydration, sepsis, pna, uti, hypoglycemia, cva, drug effect, withdrawal, encephalitis  Patient presenting to the ER for evaluation of symptoms as described above.  Based on symptoms, risk  factors and considered above differential, this presenting complaint could reflect a potentially life-threatening illness therefore the patient will be placed on continuous pulse oximetry and telemetry for monitoring.  Laboratory evaluation will be sent to evaluate for the above complaints.      Clinical Course as of 07/26/22 1615  Thu Jul 26, 2022  1426 Chest x-ray my review and interpretation without evidence of pneumothorax. [PR]  [id number] CTA without evidence of PE on my review and interpretation. [PR]    Clinical Course User Index [PR] Merlyn Lot, MD   CT without acute abnormality.  Will see if we can wean him off oxygen this may be secondary to atelectasis postseizure postictal state.  Patient was signed out to oncoming physician.  He does not appear to be in any acute distress is not consistent with aspiration pneumonia no sign of edema.  FINAL CLINICAL IMPRESSION(S) / ED DIAGNOSES   Final diagnoses:  Seizure ([place])     Rx / DC Orders   ED Discharge Orders     None        Note:  This document was prepared using Dragon voice recognition software and may include unintentional dictation errors.    Merlyn Lot, MD 07/26/22 [phone]

## 2022-07-26 NOTE — ED Triage Notes (Signed)
Patient had witnessed seizure lasting approximately 2 minutes; has not missed any meds.

## 2022-07-27 LAB — PHENYTOIN LEVEL, FREE AND TOTAL
Phenytoin, Free: NOT DETECTED ug/mL (ref 1.0–2.0)
Phenytoin, Total: 1 ug/mL — ABNORMAL LOW (ref 10.0–20.0)

## 2022-07-27 LAB — LAMOTRIGINE LEVEL: Lamotrigine Lvl: 13.2 ug/mL (ref 2.0–20.0)

## 2022-10-25 ENCOUNTER — Encounter: Payer: Self-pay | Admitting: Podiatry

## 2022-10-25 ENCOUNTER — Telehealth: Payer: Self-pay | Admitting: Podiatry

## 2022-10-25 ENCOUNTER — Ambulatory Visit (INDEPENDENT_AMBULATORY_CARE_PROVIDER_SITE_OTHER): Payer: Medicare Other | Admitting: Podiatry

## 2022-10-25 VITALS — BP 119/62

## 2022-10-25 DIAGNOSIS — Q828 Other specified congenital malformations of skin: Secondary | ICD-10-CM | POA: Diagnosis not present

## 2022-10-25 DIAGNOSIS — I739 Peripheral vascular disease, unspecified: Secondary | ICD-10-CM

## 2022-10-25 DIAGNOSIS — B351 Tinea unguium: Secondary | ICD-10-CM | POA: Diagnosis not present

## 2022-10-25 DIAGNOSIS — M79674 Pain in right toe(s): Secondary | ICD-10-CM | POA: Diagnosis not present

## 2022-10-25 DIAGNOSIS — M79675 Pain in left toe(s): Secondary | ICD-10-CM | POA: Diagnosis not present

## 2022-10-25 DIAGNOSIS — L84 Corns and callosities: Secondary | ICD-10-CM | POA: Diagnosis not present

## 2022-10-25 NOTE — Progress Notes (Signed)
  Subjective:  Patient ID: Jeremiah Nguyen, male    DOB: 02/16/1956,  MRN: [id number]  GARRETH FLATTEN presents to clinic today for at risk foot care. Patient has h/o PAD and corn(s) right lower extremity and painful thick toenails that are difficult to trim. Painful toenails interfere with ambulation. Aggravating factors include wearing enclosed shoe gear. Pain is relieved with periodic professional debridement. Painful corns are aggravated when weightbearing when wearing enclosed shoe gear. Pain is relieved with periodic professional debridement.  Chief Complaint  Patient presents with   Nail Problem    RFC,Referring Provider Armando Gang, FNP,LOV:02/24      New problem(s): None.   PCP is Armando Gang, FNP.  No Known Allergies  Review of Systems: Negative except as noted in the HPI.  Objective: No changes noted in today's physical examination. Vitals:   10/25/22 0913  BP: 119/62   Jeremiah Nguyen is a pleasant 67 y.o. male WD, WN in NAD. AAO x 3.  Vascular Examination: CFT <4 seconds b/l. DP pulses faintly palpable b/l. PT pulses diminished b/l. Digital hair absent. Skin temperature gradient warm to cool b/l. No ischemia or gangrene. No cyanosis or clubbing noted b/l.    Neurological Examination: Sensation grossly intact b/l with 10 gram monofilament. Vibratory sensation intact b/l.   Dermatological Examination: Pedal skin thin, shiny and atrophic b/l. No open wounds. No interdigital macerations.   Toenails 1-5 b/l thick, discolored, elongated with subungual debris and pain on dorsal palpation.   Porokeratotic lesion(s) right heel. No erythema, no edema, no drainage, no fluctuance. Preulcerative lesion noted medial PIPJ of R 2nd toe. There is visible subdermal hemorrhage. There is no surrounding erythema, no edema, no drainage, no odor, no fluctuance.  Musculoskeletal Examination: Muscle strength 5/5 to b/l LE. HAV with bunion deformity noted b/l LE. Severe hammertoe  deformity noted R 2nd toe.  Radiographs: None  Assessment/Plan: 1. Pain due to onychomycosis of toenails of both feet   2. Pre-ulcerative corn or callous   3. Porokeratosis   4. PAD (peripheral artery disease) (HCC)    -Patient was evaluated and treated. All patient's and/or POA's questions/concerns answered on today's visit. -Patient to continue soft, supportive shoe gear daily. -Toenails 1-5 b/l were debrided in length and girth with sterile nail nippers and dremel without iatrogenic bleeding.  -Preulcerative lesion pared R 2nd toe utilizing sterile scalpel blade. Total number pared=1. -Porokeratotic lesion(s) right heel pared and enucleated with sterile currette without incident. Total number of lesions debrided=1. -Dispensed tube foam. Apply to L 2nd toe and R 2nd toe every morning. Remove every evening. -Patient/POA to call should there be question/concern in the interim.   Return in about 3 months (around 01/25/2023).  Freddie Breech, DPM

## 2022-10-26 NOTE — Telephone Encounter (Signed)
Error entry

## 2022-11-12 ENCOUNTER — Inpatient Hospital Stay: Payer: Medicare Other

## 2022-11-12 ENCOUNTER — Encounter: Payer: Self-pay | Admitting: Oncology

## 2022-11-12 ENCOUNTER — Inpatient Hospital Stay: Payer: Medicare Other | Attending: Oncology | Admitting: Oncology

## 2022-11-12 VITALS — BP 102/58 | HR 94 | Temp 97.0°F | Resp 18 | Ht 68.0 in | Wt 159.5 lb

## 2022-11-12 DIAGNOSIS — J449 Chronic obstructive pulmonary disease, unspecified: Secondary | ICD-10-CM | POA: Insufficient documentation

## 2022-11-12 DIAGNOSIS — R569 Unspecified convulsions: Secondary | ICD-10-CM | POA: Diagnosis not present

## 2022-11-12 DIAGNOSIS — Z720 Tobacco use: Secondary | ICD-10-CM | POA: Diagnosis not present

## 2022-11-12 DIAGNOSIS — Z7982 Long term (current) use of aspirin: Secondary | ICD-10-CM | POA: Insufficient documentation

## 2022-11-12 DIAGNOSIS — R790 Abnormal level of blood mineral: Secondary | ICD-10-CM

## 2022-11-12 DIAGNOSIS — Z1159 Encounter for screening for other viral diseases: Secondary | ICD-10-CM

## 2022-11-12 DIAGNOSIS — R7989 Other specified abnormal findings of blood chemistry: Secondary | ICD-10-CM

## 2022-11-12 DIAGNOSIS — G4730 Sleep apnea, unspecified: Secondary | ICD-10-CM | POA: Insufficient documentation

## 2022-11-12 DIAGNOSIS — K219 Gastro-esophageal reflux disease without esophagitis: Secondary | ICD-10-CM | POA: Insufficient documentation

## 2022-11-12 DIAGNOSIS — K769 Liver disease, unspecified: Secondary | ICD-10-CM | POA: Diagnosis not present

## 2022-11-12 DIAGNOSIS — F17210 Nicotine dependence, cigarettes, uncomplicated: Secondary | ICD-10-CM | POA: Insufficient documentation

## 2022-11-12 DIAGNOSIS — Z9189 Other specified personal risk factors, not elsewhere classified: Secondary | ICD-10-CM

## 2022-11-12 DIAGNOSIS — F1021 Alcohol dependence, in remission: Secondary | ICD-10-CM | POA: Diagnosis not present

## 2022-11-12 DIAGNOSIS — Z79899 Other long term (current) drug therapy: Secondary | ICD-10-CM | POA: Insufficient documentation

## 2022-11-12 LAB — CBC WITH DIFFERENTIAL/PLATELET
Abs Immature Granulocytes: 0.02 10*3/uL (ref 0.00–0.07)
Basophils Absolute: 0.1 10*3/uL (ref 0.0–0.1)
Basophils Relative: 1 %
Eosinophils Absolute: 0.2 10*3/uL (ref 0.0–0.5)
Eosinophils Relative: 3 %
HCT: 46.6 % (ref 39.0–52.0)
Hemoglobin: 15 g/dL (ref 13.0–17.0)
Immature Granulocytes: 0 %
Lymphocytes Relative: 23 %
Lymphs Abs: 1.5 10*3/uL (ref 0.7–4.0)
MCH: 29.1 pg (ref 26.0–34.0)
MCHC: 32.2 g/dL (ref 30.0–36.0)
MCV: 90.3 fL (ref 80.0–100.0)
Monocytes Absolute: 0.7 10*3/uL (ref 0.1–1.0)
Monocytes Relative: 10 %
Neutro Abs: 4.1 10*3/uL (ref 1.7–7.7)
Neutrophils Relative %: 63 %
Platelets: 233 10*3/uL (ref 150–400)
RBC: 5.16 MIL/uL (ref 4.22–5.81)
RDW: 15.9 % — ABNORMAL HIGH (ref 11.5–15.5)
WBC: 6.6 10*3/uL (ref 4.0–10.5)
nRBC: 0 % (ref 0.0–0.2)

## 2022-11-12 LAB — IRON AND TIBC
Iron: 54 ug/dL (ref 45–182)
Saturation Ratios: 14 % — ABNORMAL LOW (ref 17.9–39.5)
TIBC: 388 ug/dL (ref 250–450)
UIBC: 334 ug/dL

## 2022-11-12 LAB — HEPATIC FUNCTION PANEL
ALT: 17 U/L (ref 0–44)
AST: 20 U/L (ref 15–41)
Albumin: 3.9 g/dL (ref 3.5–5.0)
Alkaline Phosphatase: 83 U/L (ref 38–126)
Bilirubin, Direct: 0.1 mg/dL (ref 0.0–0.2)
Indirect Bilirubin: 0.3 mg/dL (ref 0.3–0.9)
Total Bilirubin: 0.4 mg/dL (ref 0.3–1.2)
Total Protein: 7.1 g/dL (ref 6.5–8.1)

## 2022-11-12 LAB — HEPATITIS PANEL, ACUTE
HCV Ab: NONREACTIVE
Hep A IgM: NONREACTIVE
Hep B C IgM: NONREACTIVE
Hepatitis B Surface Ag: NONREACTIVE

## 2022-11-12 LAB — FERRITIN: Ferritin: 34 ng/mL (ref 24–336)

## 2022-11-12 NOTE — Progress Notes (Signed)
No concerns for the provider. 

## 2022-11-12 NOTE — Assessment & Plan Note (Addendum)
Patient has a history of both increased and decreased iron saturation. I recommend to check CBC, iron TIBC ferritin, liver function test, hepatitis, hemochromatosis panel.  Please blood work showed slightly decreased iron saturation at 14%.  He has normal hemoglobin. We will discuss about oral iron supplementation in the next visit.

## 2022-11-12 NOTE — Progress Notes (Signed)
Hematology/Oncology Consult note [phone] [phone]        REFERRING PROVIDER: Armando Gang, FNP   CHIEF COMPLAINTS/REASON FOR VISIT:  Evaluation of abnormal iron saturation   ASSESSMENT & PLAN:   Abnormal iron saturation Patient has a history of both increased and decreased iron saturation. I recommend to check CBC, iron TIBC ferritin, liver function test, hepatitis, hemochromatosis panel.   Orders Placed This Encounter  Procedures   Iron and TIBC    Standing Status:   Future    Number of Occurrences:   1    Standing Expiration Date:   11/12/2023   Ferritin    Standing Status:   Future    Number of Occurrences:   1    Standing Expiration Date:   05/14/2023   CBC with Differential/Platelet    Standing Status:   Future    Number of Occurrences:   1    Standing Expiration Date:   11/12/2023   Hepatic function panel    Standing Status:   Future    Number of Occurrences:   1    Standing Expiration Date:   11/12/2023   Hemochromatosis DNA-PCR(c282y,h63d)    Standing Status:   Future    Number of Occurrences:   1    Standing Expiration Date:   11/12/2023   Hepatitis panel, acute    Standing Status:   Future    Number of Occurrences:   1    Standing Expiration Date:   11/12/2023   Follow-up in 3 to 4 weeks to go over results and management plan. All questions were answered. The patient knows to call the clinic with any problems, questions or concerns.  [name], MD, PhD [facility] Hematology Oncology 11/12/2022   HISTORY OF PRESENTING ILLNESS:   Jeremiah Nguyen is a  67 y.o.  male with PMH listed below was seen in consultation at the request of  Armando Gang, FNP  for evaluation of abnormal iron saturation. Patient is a poor historian.  He currently lives in a residential facility. Used to drink alcohol, he has quit 8 years ago. Patient reports feeling well today.  Denies any pain, nausea vomiting. 03/26/2018, his iron panel showed  increased saturation of 87. 04/26/2022, iron 41, saturation 10%, TIBC 392 10/27/2022, iron saturation 10%,   Patient denies any nausea vomiting, unintentional weight loss, stomach pain.  Patient has seizure and is on multiple seizure medications.  MEDICAL HISTORY:  Past Medical History:  Diagnosis Date   COPD (chronic obstructive pulmonary disease) (HCC)    Eating disorder    GERD (gastroesophageal reflux disease)    Liver disease    Seizures (HCC)    Sleep apnea     SURGICAL HISTORY: Past Surgical History:  Procedure Laterality Date   COLONOSCOPY WITH PROPOFOL N/A 12/05/2020   Procedure: COLONOSCOPY WITH PROPOFOL;  Surgeon: Regis Bill, MD;  Location: [facility] ENDOSCOPY;  Service: Endoscopy;  Laterality: N/A;   ESOPHAGOGASTRODUODENOSCOPY (EGD) WITH PROPOFOL N/A 02/25/2018   Procedure: ESOPHAGOGASTRODUODENOSCOPY (EGD) WITH PROPOFOL;  Surgeon: Wyline Mood, MD;  Location: [facility] ENDOSCOPY;  Service: Gastroenterology;  Laterality: N/A;   LESION EXCISION Left 12-15-13   follicular cyst   SKIN BIOPSY      SOCIAL HISTORY: Social History   Socioeconomic History   Marital status: Single    Spouse name: Not on file   Number of children: 0   Years of education: Not on file   Highest education level: Not on file  Occupational  History   Not on file  Tobacco Use   Smoking status: Every Day    Packs/day: 0.50    Years: 30.00    Additional pack years: 0.00    Total pack years: 15.00    Types: Cigarettes   Smokeless tobacco: Never  Vaping Use   Vaping Use: Never used  Substance and Sexual Activity   Alcohol use: No   Drug use: No   Sexual activity: Not Currently  Other Topics Concern   Not on file  Social History Narrative   Not on file   Social Determinants of Health   Financial Resource Strain: Low Risk  (06/30/2018)   Overall Financial Resource Strain (CARDIA)    Difficulty of Paying Living Expenses: Not hard at all  Food Insecurity: No Food Insecurity (06/30/2018)    Hunger Vital Sign    Worried About Running Out of Food in the Last Year: Never true    Ran Out of Food in the Last Year: Never true  Transportation Needs: No Transportation Needs (06/30/2018)   PRAPARE - Administrator, Civil Service (Medical): No    Lack of Transportation (Non-Medical): No  Physical Activity: Inactive (06/30/2018)   Exercise Vital Sign    Days of Exercise per Week: 0 days    Minutes of Exercise per Session: 0 min  Stress: Stress Concern Present (06/30/2018)   [employer] of Occupational Health - Occupational Stress Questionnaire    Feeling of Stress : Very much  Social Connections: Unknown (06/30/2018)   Social Connection and Isolation Panel [NHANES]    Frequency of Communication with Friends and Family: Not on file    Frequency of Social Gatherings with Friends and Family: Not on file    Attends Religious Services: Not on file    Active Member of Clubs or Organizations: Not on file    Attends Banker Meetings: Not on file    Marital Status: Never married  Intimate Partner Violence: Not At Risk (06/30/2018)   Humiliation, Afraid, Rape, and Kick questionnaire    Fear of Current or Ex-Partner: No    Emotionally Abused: No    Physically Abused: No    Sexually Abused: No    FAMILY HISTORY: Family History  Problem Relation Age of Onset   Cancer Mother        breast    ALLERGIES:  has No Known Allergies.  MEDICATIONS:  Current Outpatient Medications  Medication Sig Dispense Refill   aspirin 81 MG chewable tablet Chew by mouth daily.     baclofen (LIORESAL) 10 MG tablet TAKE ONE TABLET BY MOUTH FOUR TIMES A DAY 120 each 0   BREZTRI AEROSPHERE 160-9-4.8 MCG/ACT AERO Inhale into the lungs.     buPROPion (WELLBUTRIN) 75 MG tablet Take 1 tablet (75 mg total) by mouth daily. 30 tablet 1   chlorproMAZINE (THORAZINE) 50 MG tablet TAKE ONE TABLET BY MOUTH FOUR TIMES A DAY 120 tablet 0   Cholecalciferol (VITAMIN D3) 1.25 MG (50000 UT) CAPS  Take 50,000 Units by mouth once a week.     Deutetrabenazine (AUSTEDO) 6 MG TABS Take by mouth.     isosorbide mononitrate (IMDUR) 30 MG 24 hr tablet Take 30 mg by mouth daily.     lamoTRIgine (LAMICTAL) 150 MG tablet Take 150 mg by mouth 2 (two) times daily.     lamoTRIgine (LAMICTAL) 25 MG tablet Take by mouth.     losartan (COZAAR) 25 MG tablet Take 25 mg by mouth daily.  metoprolol succinate (TOPROL-XL) 25 MG 24 hr tablet Take 25 mg by mouth 2 (two) times daily.     mirtazapine (REMERON) 15 MG tablet TAKE 1 TABLET BY MOUTH AT BEDTIME 20 tablet 0   NON-ASPIRIN PAIN RELIEVER 325 MG tablet TAKE ONE TABLET BY MOUTH EVERY 4 HOURS AS NEEDED 60 tablet 3   omeprazole (PRILOSEC) 40 MG capsule TAKE ONE CAPSULE BY MOUTH EVERY DAY *DO NOT CRUSH* 30 capsule 11   PHENObarbital (LUMINAL) 60 MG tablet Take 1 tablet (60 mg total) by mouth at bedtime. 30 tablet 0   phenytoin (DILANTIN) 100 MG ER capsule TAKE 2 CAPSULES BY MOUTH EVERY MORNING *DO NOT CRUSH OR CHEW* *HAZARDOUS DRUG: WEAR GLOVES*;TAKE 3 CAPSULES BY MOUTH EVERY EVENING *DO NOT CRUSH OR CHEW* *HAZARDOUS DRUG: WEAR GLOVES* 147 capsule 10   potassium chloride (KLOR-CON M) 10 MEQ tablet Take 10 mEq by mouth daily.     Potassium Chloride ER 20 MEQ TBCR Take 20 mEq by mouth daily. 30 tablet 5   potassium chloride SA (KLOR-CON) 20 MEQ tablet TAKE 1 TABLET BY MOUTH ONCE DAILY *DO NOT CRUSH* 30 tablet 0   SENNA-PLUS 8.6-50 MG tablet TAKE 1 TABLET  BY MOUTH TWICE DAILY 60 tablet 10   sertraline (ZOLOFT) 25 MG tablet Take 1 tablet (25 mg total) by mouth daily. 30 tablet 1   traZODone (DESYREL) 100 MG tablet TAKE ONE TABLET BY MOUTH AT BEDTIME 30 tablet 0   VERQUVO 10 MG TABS Take 1 tablet by mouth daily.     No current facility-administered medications for this visit.    Review of Systems  Constitutional:  Negative for appetite change, chills, fatigue, fever and unexpected weight change.  HENT:   Negative for hearing loss and voice change.   Eyes:   Negative for eye problems and icterus.  Respiratory:  Negative for chest tightness, cough and shortness of breath.   Cardiovascular:  Negative for chest pain and leg swelling.  Gastrointestinal:  Negative for abdominal distention and abdominal pain.  Endocrine: Negative for hot flashes.  Genitourinary:  Negative for difficulty urinating, dysuria and frequency.   Musculoskeletal:  Negative for arthralgias.  Skin:  Negative for itching and rash.  Neurological:  Negative for light-headedness and numbness.  Hematological:  Negative for adenopathy. Does not bruise/bleed easily.  Psychiatric/Behavioral:  Negative for confusion.    PHYSICAL EXAMINATION: ECOG PERFORMANCE STATUS: 1 - Symptomatic but completely ambulatory Vitals:   11/12/22 0918  BP: (!) 102/58  Pulse: 94  Resp: 18  Temp: (!) 97 F (36.1 C)  SpO2: 91%   Filed Weights   11/12/22 0918  Weight: 159 lb 8 oz (72.3 kg)    Physical Exam Constitutional:      General: He is not in acute distress. HENT:     Head: Normocephalic and atraumatic.  Eyes:     General: No scleral icterus. Cardiovascular:     Rate and Rhythm: Normal rate and regular rhythm.     Heart sounds: Normal heart sounds.  Pulmonary:     Effort: Pulmonary effort is normal. No respiratory distress.     Breath sounds: No wheezing.  Abdominal:     General: Bowel sounds are normal. There is no distension.     Palpations: Abdomen is soft.  Musculoskeletal:        General: No deformity. Normal range of motion.     Cervical back: Normal range of motion and neck supple.  Skin:    General: Skin is warm and dry.  Findings: No erythema or rash.  Neurological:     Mental Status: He is alert and oriented to person, place, and time. Mental status is at baseline.     Cranial Nerves: No cranial nerve deficit.     Coordination: Coordination normal.  Psychiatric:     Comments: Flat      LABORATORY DATA:  I have reviewed the data as listed    Latest Ref Rng  & Units 11/12/2022   10:10 AM 07/26/2022    1:19 PM 06/08/2021    5:02 PM  CBC  WBC 4.0 - 10.5 K/uL 6.6  5.3  5.5   Hemoglobin 13.0 - 17.0 g/dL 36.6  44.0  34.7   Hematocrit 39.0 - 52.0 % 46.6  42.7  48.2   Platelets 150 - 400 K/uL 233  234  239       Latest Ref Rng & Units 11/12/2022   10:10 AM 07/26/2022    1:19 PM 06/08/2021    5:02 PM  CMP  Glucose 70 - 99 mg/dL  95  81   BUN 8 - 23 mg/dL  10  11   Creatinine 4.25 - 1.24 mg/dL  9.56  3.87   Sodium 564 - 145 mmol/L  139  139   Potassium 3.5 - 5.1 mmol/L  3.6  3.9   Chloride 98 - 111 mmol/L  103  104   CO2 22 - 32 mmol/L  28  28   Calcium 8.9 - 10.3 mg/dL  8.6  9.5   Total Protein 6.5 - 8.1 g/dL 7.1   7.5   Total Bilirubin 0.3 - 1.2 mg/dL 0.4   0.7   Alkaline Phos 38 - 126 U/L 83   91   AST 15 - 41 U/L 20   24   ALT 0 - 44 U/L 17   21       RADIOGRAPHIC STUDIES: I have personally reviewed the radiological images as listed and agreed with the findings in the report. No results found.

## 2022-12-03 ENCOUNTER — Ambulatory Visit: Payer: Medicare Other | Admitting: Oncology

## 2022-12-03 ENCOUNTER — Encounter: Payer: Self-pay | Admitting: Oncology

## 2022-12-03 ENCOUNTER — Inpatient Hospital Stay: Payer: Medicare Other | Attending: Oncology | Admitting: Oncology

## 2022-12-03 VITALS — BP 95/53 | HR 91 | Temp 96.9°F | Resp 18 | Wt 158.2 lb

## 2022-12-03 DIAGNOSIS — G4730 Sleep apnea, unspecified: Secondary | ICD-10-CM | POA: Diagnosis not present

## 2022-12-03 DIAGNOSIS — F17210 Nicotine dependence, cigarettes, uncomplicated: Secondary | ICD-10-CM | POA: Insufficient documentation

## 2022-12-03 DIAGNOSIS — K219 Gastro-esophageal reflux disease without esophagitis: Secondary | ICD-10-CM | POA: Diagnosis not present

## 2022-12-03 DIAGNOSIS — Z79899 Other long term (current) drug therapy: Secondary | ICD-10-CM | POA: Diagnosis not present

## 2022-12-03 DIAGNOSIS — R790 Abnormal level of blood mineral: Secondary | ICD-10-CM | POA: Insufficient documentation

## 2022-12-03 DIAGNOSIS — R569 Unspecified convulsions: Secondary | ICD-10-CM | POA: Diagnosis not present

## 2022-12-03 LAB — HEMOCHROMATOSIS DNA-PCR(C282Y,H63D)

## 2022-12-03 MED ORDER — FERROUS SULFATE 325 (65 FE) MG PO TBEC: 325.0000 mg | DELAYED_RELEASE_TABLET | ORAL | 0 refills | Status: DC

## 2022-12-03 NOTE — Progress Notes (Signed)
Hematology/Oncology Consult note [phone] [phone]        REFERRING PROVIDER: Armando Gang, FNP   CHIEF COMPLAINTS/REASON FOR VISIT:  Evaluation of abnormal iron saturation   ASSESSMENT & PLAN:   Abnormal iron saturation Patient has a history of both increased and decreased iron saturation. Labs are reviewed and discussed with patient. Results showed slightly decreased iron saturation at 14%.  He has normal hemoglobin. Recommend patient to take ferrous sulfate 325 mg every other day.   Orders Placed This Encounter  Procedures   CBC with Differential (Cancer Center Only)    Standing Status:   Future    Standing Expiration Date:   12/03/2023   Iron and TIBC    Standing Status:   Future    Standing Expiration Date:   12/03/2023   Ferritin    Standing Status:   Future    Standing Expiration Date:   12/03/2023   Follow-up in 4 months.. All questions were answered. The patient knows to call the clinic with any problems, questions or concerns.  [name], MD, PhD [facility] Hematology Oncology 12/03/2022   HISTORY OF PRESENTING ILLNESS:   Jeremiah Nguyen is a  67 y.o.  male with PMH listed below was seen in consultation at the request of  Armando Gang, FNP  for evaluation of abnormal iron saturation. Patient is a poor historian.  He currently lives in a residential facility. Used to drink alcohol, he has quit 8 years ago. Patient reports feeling well today.  Denies any pain, nausea vomiting. 03/26/2018, his iron panel showed increased saturation of 87. 04/26/2022, iron 41, saturation 10%, TIBC 392 10/27/2022, iron saturation 10%,   Patient denies any nausea vomiting, unintentional weight loss, stomach pain.  Patient has seizure and is on multiple seizure medications.  INTERVAL HISTORY Jeremiah Nguyen is a 67 y.o. male who has above history reviewed by me today presents for follow up visit for decreased iron saturation. Patient had blood work  done during interval. He has no new complaints. MEDICAL HISTORY:  Past Medical History:  Diagnosis Date   COPD (chronic obstructive pulmonary disease) (HCC)    Eating disorder    GERD (gastroesophageal reflux disease)    Liver disease    Seizures (HCC)    Sleep apnea     SURGICAL HISTORY: Past Surgical History:  Procedure Laterality Date   COLONOSCOPY WITH PROPOFOL N/A 12/05/2020   Procedure: COLONOSCOPY WITH PROPOFOL;  Surgeon: Regis Bill, MD;  Location: [facility] ENDOSCOPY;  Service: Endoscopy;  Laterality: N/A;   ESOPHAGOGASTRODUODENOSCOPY (EGD) WITH PROPOFOL N/A 02/25/2018   Procedure: ESOPHAGOGASTRODUODENOSCOPY (EGD) WITH PROPOFOL;  Surgeon: Wyline Mood, MD;  Location: [facility] ENDOSCOPY;  Service: Gastroenterology;  Laterality: N/A;   LESION EXCISION Left 12-15-13   follicular cyst   SKIN BIOPSY      SOCIAL HISTORY: Social History   Socioeconomic History   Marital status: Single    Spouse name: Not on file   Number of children: 0   Years of education: Not on file   Highest education level: Not on file  Occupational History   Not on file  Tobacco Use   Smoking status: Every Day    Current packs/day: 0.50    Average packs/day: 0.5 packs/day for 30.0 years (15.0 ttl pk-yrs)    Types: Cigarettes   Smokeless tobacco: Never  Vaping Use   Vaping status: Never Used  Substance and Sexual Activity   Alcohol use: No   Drug use: No  Sexual activity: Not Currently  Other Topics Concern   Not on file  Social History Narrative   Not on file   Social Determinants of Health   Financial Resource Strain: Low Risk  (06/30/2018)   Overall Financial Resource Strain (CARDIA)    Difficulty of Paying Living Expenses: Not hard at all  Food Insecurity: No Food Insecurity (06/30/2018)   Hunger Vital Sign    Worried About Running Out of Food in the Last Year: Never true    Ran Out of Food in the Last Year: Never true  Transportation Needs: No Transportation Needs (06/30/2018)    PRAPARE - Administrator, Civil Service (Medical): No    Lack of Transportation (Non-Medical): No  Physical Activity: Inactive (06/30/2018)   Exercise Vital Sign    Days of Exercise per Week: 0 days    Minutes of Exercise per Session: 0 min  Stress: Stress Concern Present (06/30/2018)   [employer] of Occupational Health - Occupational Stress Questionnaire    Feeling of Stress : Very much  Social Connections: Unknown (06/30/2018)   Social Connection and Isolation Panel [NHANES]    Frequency of Communication with Friends and Family: Not on file    Frequency of Social Gatherings with Friends and Family: Not on file    Attends Religious Services: Not on file    Active Member of Clubs or Organizations: Not on file    Attends Banker Meetings: Not on file    Marital Status: Never married  Intimate Partner Violence: Not At Risk (06/30/2018)   Humiliation, Afraid, Rape, and Kick questionnaire    Fear of Current or Ex-Partner: No    Emotionally Abused: No    Physically Abused: No    Sexually Abused: No    FAMILY HISTORY: Family History  Problem Relation Age of Onset   Cancer Mother        breast    ALLERGIES:  has No Known Allergies.  MEDICATIONS:  Current Outpatient Medications  Medication Sig Dispense Refill   aspirin 81 MG chewable tablet Chew by mouth daily.     baclofen (LIORESAL) 10 MG tablet TAKE ONE TABLET BY MOUTH FOUR TIMES A DAY 120 each 0   BREZTRI AEROSPHERE 160-9-4.8 MCG/ACT AERO Inhale into the lungs.     buPROPion (WELLBUTRIN) 75 MG tablet Take 1 tablet (75 mg total) by mouth daily. 30 tablet 1   chlorproMAZINE (THORAZINE) 50 MG tablet TAKE ONE TABLET BY MOUTH FOUR TIMES A DAY 120 tablet 0   Cholecalciferol (VITAMIN D3) 1.25 MG (50000 UT) CAPS Take 50,000 Units by mouth once a week.     Deutetrabenazine (AUSTEDO) 6 MG TABS Take by mouth.     ferrous sulfate 325 (65 FE) MG EC tablet Take 1 tablet (325 mg total) by mouth every other day. 45  tablet 0   isosorbide mononitrate (IMDUR) 30 MG 24 hr tablet Take 30 mg by mouth daily.     lamoTRIgine (LAMICTAL) 150 MG tablet Take 150 mg by mouth 2 (two) times daily.     lamoTRIgine (LAMICTAL) 25 MG tablet Take by mouth.     losartan (COZAAR) 25 MG tablet Take 25 mg by mouth daily.     metoprolol succinate (TOPROL-XL) 25 MG 24 hr tablet Take 25 mg by mouth 2 (two) times daily.     mirtazapine (REMERON) 15 MG tablet TAKE 1 TABLET BY MOUTH AT BEDTIME 20 tablet 0   NON-ASPIRIN PAIN RELIEVER 325 MG tablet TAKE ONE TABLET  BY MOUTH EVERY 4 HOURS AS NEEDED 60 tablet 3   omeprazole (PRILOSEC) 40 MG capsule TAKE ONE CAPSULE BY MOUTH EVERY DAY *DO NOT CRUSH* 30 capsule 11   PHENObarbital (LUMINAL) 60 MG tablet Take 1 tablet (60 mg total) by mouth at bedtime. 30 tablet 0   phenytoin (DILANTIN) 100 MG ER capsule TAKE 2 CAPSULES BY MOUTH EVERY MORNING *DO NOT CRUSH OR CHEW* *HAZARDOUS DRUG: WEAR GLOVES*;TAKE 3 CAPSULES BY MOUTH EVERY EVENING *DO NOT CRUSH OR CHEW* *HAZARDOUS DRUG: WEAR GLOVES* 147 capsule 10   potassium chloride (KLOR-CON M) 10 MEQ tablet Take 10 mEq by mouth daily.     Potassium Chloride ER 20 MEQ TBCR Take 20 mEq by mouth daily. 30 tablet 5   potassium chloride SA (KLOR-CON) 20 MEQ tablet TAKE 1 TABLET BY MOUTH ONCE DAILY *DO NOT CRUSH* 30 tablet 0   SENNA-PLUS 8.6-50 MG tablet TAKE 1 TABLET  BY MOUTH TWICE DAILY 60 tablet 10   sertraline (ZOLOFT) 25 MG tablet Take 1 tablet (25 mg total) by mouth daily. 30 tablet 1   traZODone (DESYREL) 100 MG tablet TAKE ONE TABLET BY MOUTH AT BEDTIME 30 tablet 0   VERQUVO 10 MG TABS Take 1 tablet by mouth daily.     No current facility-administered medications for this visit.    Review of Systems  Constitutional:  Negative for appetite change, chills, fatigue, fever and unexpected weight change.  HENT:   Negative for hearing loss and voice change.   Eyes:  Negative for eye problems and icterus.  Respiratory:  Negative for chest tightness, cough  and shortness of breath.   Cardiovascular:  Negative for chest pain and leg swelling.  Gastrointestinal:  Negative for abdominal distention and abdominal pain.  Endocrine: Negative for hot flashes.  Genitourinary:  Negative for difficulty urinating, dysuria and frequency.   Musculoskeletal:  Negative for arthralgias.  Skin:  Negative for itching and rash.  Neurological:  Negative for light-headedness and numbness.  Hematological:  Negative for adenopathy. Does not bruise/bleed easily.  Psychiatric/Behavioral:  Negative for confusion.    PHYSICAL EXAMINATION: ECOG PERFORMANCE STATUS: 1 - Symptomatic but completely ambulatory Vitals:   12/03/22 1412  BP: (!) 95/53  Pulse: 91  Resp: 18  Temp: (!) 96.9 F (36.1 C)  SpO2: 91%   Filed Weights   12/03/22 1412  Weight: 158 lb 3.2 oz (71.8 kg)    Physical Exam Constitutional:      General: He is not in acute distress. HENT:     Head: Normocephalic and atraumatic.  Eyes:     General: No scleral icterus. Cardiovascular:     Rate and Rhythm: Normal rate and regular rhythm.     Heart sounds: Normal heart sounds.  Pulmonary:     Effort: Pulmonary effort is normal. No respiratory distress.     Breath sounds: No wheezing.  Abdominal:     General: Bowel sounds are normal. There is no distension.     Palpations: Abdomen is soft.  Musculoskeletal:        General: No deformity. Normal range of motion.     Cervical back: Normal range of motion and neck supple.  Skin:    General: Skin is warm and dry.     Findings: No erythema or rash.  Neurological:     Mental Status: He is alert and oriented to person, place, and time. Mental status is at baseline.     Cranial Nerves: No cranial nerve deficit.     Coordination:  Coordination normal.  Psychiatric:     Comments: Flat      LABORATORY DATA:  I have reviewed the data as listed    Latest Ref Rng & Units 11/12/2022   10:10 AM 07/26/2022    1:19 PM 06/08/2021    5:02 PM  CBC  WBC 4.0  - 10.5 K/uL 6.6  5.3  5.5   Hemoglobin 13.0 - 17.0 g/dL 56.2  13.0  86.5   Hematocrit 39.0 - 52.0 % 46.6  42.7  48.2   Platelets 150 - 400 K/uL 233  234  239       Latest Ref Rng & Units 11/12/2022   10:10 AM 07/26/2022    1:19 PM 06/08/2021    5:02 PM  CMP  Glucose 70 - 99 mg/dL  95  81   BUN 8 - 23 mg/dL  10  11   Creatinine 7.84 - 1.24 mg/dL  6.96  2.95   Sodium 284 - 145 mmol/L  139  139   Potassium 3.5 - 5.1 mmol/L  3.6  3.9   Chloride 98 - 111 mmol/L  103  104   CO2 22 - 32 mmol/L  28  28   Calcium 8.9 - 10.3 mg/dL  8.6  9.5   Total Protein 6.5 - 8.1 g/dL 7.1   7.5   Total Bilirubin 0.3 - 1.2 mg/dL 0.4   0.7   Alkaline Phos 38 - 126 U/L 83   91   AST 15 - 41 U/L 20   24   ALT 0 - 44 U/L 17   21       RADIOGRAPHIC STUDIES: I have personally reviewed the radiological images as listed and agreed with the findings in the report. No results found.

## 2022-12-03 NOTE — Assessment & Plan Note (Addendum)
Patient has a history of both increased and decreased iron saturation. Labs are reviewed and discussed with patient. Results showed slightly decreased iron saturation at 14%.  He has normal hemoglobin. Recommend patient to take ferrous sulfate 325 mg every other day.

## 2023-02-07 ENCOUNTER — Ambulatory Visit (INDEPENDENT_AMBULATORY_CARE_PROVIDER_SITE_OTHER): Payer: 59 | Admitting: Podiatry

## 2023-02-07 DIAGNOSIS — M79674 Pain in right toe(s): Secondary | ICD-10-CM

## 2023-02-07 DIAGNOSIS — B351 Tinea unguium: Secondary | ICD-10-CM | POA: Diagnosis not present

## 2023-02-07 DIAGNOSIS — I739 Peripheral vascular disease, unspecified: Secondary | ICD-10-CM

## 2023-02-07 DIAGNOSIS — Q828 Other specified congenital malformations of skin: Secondary | ICD-10-CM

## 2023-02-07 DIAGNOSIS — M79675 Pain in left toe(s): Secondary | ICD-10-CM

## 2023-02-07 DIAGNOSIS — L84 Corns and callosities: Secondary | ICD-10-CM

## 2023-02-07 NOTE — Progress Notes (Signed)
Subjective:  Patient ID: Jeremiah Nguyen, male    DOB: Jan 16, 1956,  MRN: [id number]  Jeremiah Nguyen presents to clinic today for at risk foot care. Patient has h/o PAD and painful porokeratotic lesion(s) right foot and painful mycotic toenails that limit ambulation. Painful toenails interfere with ambulation. Aggravating factors include wearing enclosed shoe gear. Pain is relieved with periodic professional debridement. Painful porokeratotic lesions are aggravated when weightbearing with and without shoegear. Pain is relieved with periodic professional debridement.  Chief Complaint  Patient presents with   Nail Problem    RFC,Referring Provider Armando Gang, FNP,lov:08/24      New problem(s): None.   PCP is Armando Gang, FNP.  No Known Allergies  Review of Systems: Negative except as noted in the HPI.  Objective: No changes noted in today's physical examination. There were no vitals filed for this visit. Jeremiah Nguyen is a pleasant 67 y.o. male in NAD. AAO x 3.  Vascular Examination: CFT <3 seconds b/l. DP pulses faintly palpable b/l. PT pulses nonpalpable b/l. Digital hair absent. Skin temperature gradient warm to warm b/l. No pain with calf compression. No ischemia or gangrene. No cyanosis or clubbing noted b/l.    Neurological Examination: Sensation grossly intact b/l with 10 gram monofilament. Vibratory sensation intact b/l.   Dermatological Examination: Pedal skin warm and supple b/l. No open wounds b/l. No interdigital macerations. Toenails 1-5 b/l thick, discolored, elongated with subungual debris and pain on dorsal palpation.  Porokeratotic lesion(s) right heel and medial PIPJ of R 2nd toe. No erythema, no edema, no drainage, no fluctuance.  Musculoskeletal Examination: Muscle strength 5/5 to all lower extremity muscle groups bilaterally. HAV with bunion deformity noted b/l LE. Hammertoe(s) noted to the R 2nd toe.  Radiographs: None  Assessment/Plan: 1. Pain  due to onychomycosis of toenails of both feet   2. Porokeratosis   3. PAD (peripheral artery disease) (HCC)     -Consent given for treatment as described below: -Examined patient. -Patient to continue soft, supportive shoe gear daily. -Mycotic toenails 1-5 bilaterally were debrided in length and girth with sterile nail nippers and dremel without incident. -Porokeratotic lesion(s) right heel and right second digit pared and enucleated with sterile currette without incident. Total number of lesions debrided=2. -Dispensed tube foam for b/l great toes. Apply to left great toe and right great toe every morning. Remove every evening. -Patient/POA to call should there be question/concern in the interim.   Return in about 3 months (around 05/09/2023).  Freddie Breech, DPM

## 2023-02-11 ENCOUNTER — Encounter: Payer: Self-pay | Admitting: Podiatry

## 2023-03-13 ENCOUNTER — Telehealth: Payer: Self-pay | Admitting: *Deleted

## 2023-03-15 MED ORDER — FERROUS SULFATE 325 (65 FE) MG PO TBEC: 325.0000 mg | DELAYED_RELEASE_TABLET | ORAL | 0 refills | Status: AC

## 2023-04-02 ENCOUNTER — Inpatient Hospital Stay: Payer: 59 | Attending: Oncology

## 2023-04-02 DIAGNOSIS — K219 Gastro-esophageal reflux disease without esophagitis: Secondary | ICD-10-CM | POA: Insufficient documentation

## 2023-04-02 DIAGNOSIS — Z79899 Other long term (current) drug therapy: Secondary | ICD-10-CM | POA: Diagnosis not present

## 2023-04-02 DIAGNOSIS — R569 Unspecified convulsions: Secondary | ICD-10-CM | POA: Diagnosis not present

## 2023-04-02 DIAGNOSIS — R790 Abnormal level of blood mineral: Secondary | ICD-10-CM | POA: Insufficient documentation

## 2023-04-02 DIAGNOSIS — G4730 Sleep apnea, unspecified: Secondary | ICD-10-CM | POA: Diagnosis not present

## 2023-04-02 DIAGNOSIS — F17210 Nicotine dependence, cigarettes, uncomplicated: Secondary | ICD-10-CM | POA: Insufficient documentation

## 2023-04-02 LAB — CBC WITH DIFFERENTIAL (CANCER CENTER ONLY)
Abs Immature Granulocytes: 0.02 10*3/uL (ref 0.00–0.07)
Basophils Absolute: 0.1 10*3/uL (ref 0.0–0.1)
Basophils Relative: 1 %
Eosinophils Absolute: 0.3 10*3/uL (ref 0.0–0.5)
Eosinophils Relative: 5 %
HCT: 41.8 % (ref 39.0–52.0)
Hemoglobin: 13.8 g/dL (ref 13.0–17.0)
Immature Granulocytes: 0 %
Lymphocytes Relative: 19 %
Lymphs Abs: 1.2 10*3/uL (ref 0.7–4.0)
MCH: 30.2 pg (ref 26.0–34.0)
MCHC: 33 g/dL (ref 30.0–36.0)
MCV: 91.5 fL (ref 80.0–100.0)
Monocytes Absolute: 0.5 10*3/uL (ref 0.1–1.0)
Monocytes Relative: 7 %
Neutro Abs: 4.5 10*3/uL (ref 1.7–7.7)
Neutrophils Relative %: 68 %
Platelet Count: 213 10*3/uL (ref 150–400)
RBC: 4.57 MIL/uL (ref 4.22–5.81)
RDW: 14.9 % (ref 11.5–15.5)
WBC Count: 6.6 10*3/uL (ref 4.0–10.5)
nRBC: 0 % (ref 0.0–0.2)

## 2023-04-02 LAB — IRON AND TIBC
Iron: 56 ug/dL (ref 45–182)
Saturation Ratios: 19 % (ref 17.9–39.5)
TIBC: 300 ug/dL (ref 250–450)
UIBC: 244 ug/dL

## 2023-04-02 LAB — FERRITIN: Ferritin: 83 ng/mL (ref 24–336)

## 2023-04-04 ENCOUNTER — Inpatient Hospital Stay: Payer: 59 | Admitting: Oncology

## 2023-04-04 NOTE — Assessment & Plan Note (Deleted)
Patient has a history of both increased and decreased iron saturation. Labs are reviewed and discussed with patient. Lab Results  Component Value Date   HGB 13.8 04/02/2023   TIBC 300 04/02/2023   IRONPCTSAT 19 04/02/2023   FERRITIN 83 04/02/2023     Results showed slightly decreased iron saturation at 14%.  He has normal hemoglobin. Recommend patient to take ferrous sulfate 325 mg every other day.

## 2023-04-15 ENCOUNTER — Inpatient Hospital Stay (HOSPITAL_BASED_OUTPATIENT_CLINIC_OR_DEPARTMENT_OTHER): Payer: 59 | Admitting: Oncology

## 2023-04-15 VITALS — BP 103/68 | HR 89 | Temp 97.8°F | Resp 18 | Wt 154.0 lb

## 2023-04-15 DIAGNOSIS — R790 Abnormal level of blood mineral: Secondary | ICD-10-CM

## 2023-04-15 NOTE — Progress Notes (Signed)
Hematology/Oncology Progress note [phone] [phone]           REFERRING PROVIDER: Armando Gang, FNP   CHIEF COMPLAINTS/REASON FOR VISIT:  Evaluation of abnormal iron saturation   ASSESSMENT & PLAN:   Abnormal iron saturation Labs are reviewed and discussed with patient. Lab Results  Component Value Date   HGB 13.8 04/02/2023   TIBC 300 04/02/2023   IRONPCTSAT 19 04/02/2023   FERRITIN 83 04/02/2023    Hemoglobin and iron panel are both within normal limits. Patient may stop oral iron supplementation Recommend patient to take ferrous sulfate 325 mg every other day.  Patient is discharged from my clinic. I recommend patient to continue follow up with primary care physician. Patient may re-establish care in the future if clinically indicated.  All questions were answered. The patient knows to call the clinic with any problems, questions or concerns.  [name], MD, PhD [facility] Hematology Oncology 04/15/2023   HISTORY OF PRESENTING ILLNESS:   Jeremiah Nguyen is a  67 y.o.  male with PMH listed below was seen in consultation at the request of  Armando Gang, FNP  for evaluation of abnormal iron saturation. Patient is a poor historian.  He currently lives in a residential facility. Used to drink alcohol, he has quit 8 years ago. Patient reports feeling well today.  Denies any pain, nausea vomiting. 03/26/2018, his iron panel showed increased saturation of 87. 04/26/2022, iron 41, saturation 10%, TIBC 392 10/27/2022, iron saturation 10%,   Patient denies any nausea vomiting, unintentional weight loss, stomach pain.  Patient has seizure and is on multiple seizure medications.  INTERVAL HISTORY Jeremiah Nguyen is a 67 y.o. male who has above history reviewed by me today presents for follow up visit for decreased iron saturation. He has no new complaints.   MEDICAL HISTORY:  Past Medical History:  Diagnosis Date   COPD (chronic obstructive  pulmonary disease) (HCC)    Eating disorder    GERD (gastroesophageal reflux disease)    Liver disease    Seizures (HCC)    Sleep apnea     SURGICAL HISTORY: Past Surgical History:  Procedure Laterality Date   COLONOSCOPY WITH PROPOFOL N/A 12/05/2020   Procedure: COLONOSCOPY WITH PROPOFOL;  Surgeon: Regis Bill, MD;  Location: [facility] ENDOSCOPY;  Service: Endoscopy;  Laterality: N/A;   ESOPHAGOGASTRODUODENOSCOPY (EGD) WITH PROPOFOL N/A 02/25/2018   Procedure: ESOPHAGOGASTRODUODENOSCOPY (EGD) WITH PROPOFOL;  Surgeon: Wyline Mood, MD;  Location: [facility] ENDOSCOPY;  Service: Gastroenterology;  Laterality: N/A;   LESION EXCISION Left 12-15-13   follicular cyst   SKIN BIOPSY      SOCIAL HISTORY: Social History   Socioeconomic History   Marital status: Single    Spouse name: Not on file   Number of children: 0   Years of education: Not on file   Highest education level: Not on file  Occupational History   Not on file  Tobacco Use   Smoking status: Every Day    Current packs/day: 0.50    Average packs/day: 0.5 packs/day for 30.0 years (15.0 ttl pk-yrs)    Types: Cigarettes   Smokeless tobacco: Never  Vaping Use   Vaping status: Never Used  Substance and Sexual Activity   Alcohol use: No   Drug use: No   Sexual activity: Not Currently  Other Topics Concern   Not on file  Social History Narrative   Not on file   Social Determinants of Health   Financial Resource Strain:  Low Risk  (06/30/2018)   Overall Financial Resource Strain (CARDIA)    Difficulty of Paying Living Expenses: Not hard at all  Food Insecurity: No Food Insecurity (06/30/2018)   Hunger Vital Sign    Worried About Running Out of Food in the Last Year: Never true    Ran Out of Food in the Last Year: Never true  Transportation Needs: No Transportation Needs (06/30/2018)   PRAPARE - Administrator, Civil Service (Medical): No    Lack of Transportation (Non-Medical): No  Physical Activity: Inactive  (06/30/2018)   Exercise Vital Sign    Days of Exercise per Week: 0 days    Minutes of Exercise per Session: 0 min  Stress: Stress Concern Present (06/30/2018)   [employer] of Occupational Health - Occupational Stress Questionnaire    Feeling of Stress : Very much  Social Connections: Unknown (06/30/2018)   Social Connection and Isolation Panel [NHANES]    Frequency of Communication with Friends and Family: Not on file    Frequency of Social Gatherings with Friends and Family: Not on file    Attends Religious Services: Not on file    Active Member of Clubs or Organizations: Not on file    Attends Banker Meetings: Not on file    Marital Status: Never married  Intimate Partner Violence: Not At Risk (06/30/2018)   Humiliation, Afraid, Rape, and Kick questionnaire    Fear of Current or Ex-Partner: No    Emotionally Abused: No    Physically Abused: No    Sexually Abused: No    FAMILY HISTORY: Family History  Problem Relation Age of Onset   Cancer Mother        breast    ALLERGIES:  has No Known Allergies.  MEDICATIONS:  Current Outpatient Medications  Medication Sig Dispense Refill   aspirin 81 MG chewable tablet Chew by mouth daily.     baclofen (LIORESAL) 10 MG tablet TAKE ONE TABLET BY MOUTH FOUR TIMES A DAY 120 each 0   BREZTRI AEROSPHERE 160-9-4.8 MCG/ACT AERO Inhale into the lungs.     buPROPion (WELLBUTRIN) 75 MG tablet Take 1 tablet (75 mg total) by mouth daily. 30 tablet 1   chlorproMAZINE (THORAZINE) 50 MG tablet TAKE ONE TABLET BY MOUTH FOUR TIMES A DAY 120 tablet 0   folic acid (FOLVITE) 1 MG tablet Take 1 mg by mouth daily.     isosorbide mononitrate (IMDUR) 30 MG 24 hr tablet Take 30 mg by mouth daily.     lamoTRIgine (LAMICTAL) 150 MG tablet Take 150 mg by mouth 2 (two) times daily.     losartan (COZAAR) 25 MG tablet Take 25 mg by mouth daily.     metoprolol succinate (TOPROL-XL) 25 MG 24 hr tablet Take 25 mg by mouth 2 (two) times daily.      mirtazapine (REMERON) 15 MG tablet TAKE 1 TABLET BY MOUTH AT BEDTIME 20 tablet 0   omeprazole (PRILOSEC) 40 MG capsule TAKE ONE CAPSULE BY MOUTH EVERY DAY *DO NOT CRUSH* 30 capsule 11   phenytoin (DILANTIN) 100 MG ER capsule TAKE 2 CAPSULES BY MOUTH EVERY MORNING *DO NOT CRUSH OR CHEW* *HAZARDOUS DRUG: WEAR GLOVES*;TAKE 3 CAPSULES BY MOUTH EVERY EVENING *DO NOT CRUSH OR CHEW* *HAZARDOUS DRUG: WEAR GLOVES* 147 capsule 10   potassium chloride (KLOR-CON M) 10 MEQ tablet Take 10 mEq by mouth daily.     SENNA-PLUS 8.6-50 MG tablet TAKE 1 TABLET  BY MOUTH TWICE DAILY 60 tablet 10  sertraline (ZOLOFT) 25 MG tablet Take 1 tablet (25 mg total) by mouth daily. 30 tablet 1   VERQUVO 10 MG TABS Take 1 tablet by mouth daily.     Cholecalciferol (VITAMIN D3) 1.25 MG (50000 UT) CAPS Take 50,000 Units by mouth once a week.     Deutetrabenazine (AUSTEDO) 6 MG TABS Take by mouth.     ferrous sulfate 325 (65 FE) MG EC tablet Take 1 tablet (325 mg total) by mouth every other day. 45 tablet 0   NON-ASPIRIN PAIN RELIEVER 325 MG tablet TAKE ONE TABLET BY MOUTH EVERY 4 HOURS AS NEEDED (Patient not taking: Reported on 04/15/2023) 60 tablet 3   PHENObarbital (LUMINAL) 60 MG tablet Take 1 tablet (60 mg total) by mouth at bedtime. (Patient not taking: Reported on 04/15/2023) 30 tablet 0   Potassium Chloride ER 20 MEQ TBCR Take 20 mEq by mouth daily. (Patient not taking: Reported on 04/15/2023) 30 tablet 5   potassium chloride SA (KLOR-CON) 20 MEQ tablet TAKE 1 TABLET BY MOUTH ONCE DAILY *DO NOT CRUSH* (Patient not taking: Reported on 04/15/2023) 30 tablet 0   traZODone (DESYREL) 100 MG tablet TAKE ONE TABLET BY MOUTH AT BEDTIME (Patient not taking: Reported on 04/15/2023) 30 tablet 0   No current facility-administered medications for this visit.    Review of Systems  Constitutional:  Negative for appetite change, chills, fatigue, fever and unexpected weight change.  HENT:   Negative for hearing loss and voice change.    Eyes:  Negative for eye problems and icterus.  Respiratory:  Negative for chest tightness, cough and shortness of breath.   Cardiovascular:  Negative for chest pain and leg swelling.  Gastrointestinal:  Negative for abdominal distention and abdominal pain.  Endocrine: Negative for hot flashes.  Genitourinary:  Negative for difficulty urinating, dysuria and frequency.   Musculoskeletal:  Negative for arthralgias.  Skin:  Negative for itching and rash.  Neurological:  Negative for light-headedness and numbness.  Hematological:  Negative for adenopathy. Does not bruise/bleed easily.  Psychiatric/Behavioral:  Negative for confusion.    PHYSICAL EXAMINATION: ECOG PERFORMANCE STATUS: 1 - Symptomatic but completely ambulatory Vitals:   04/15/23 1107  BP: 103/68  Pulse: 89  Resp: 18  Temp: 97.8 F (36.6 C)   Filed Weights   04/15/23 1107  Weight: 154 lb (69.9 kg)    Physical Exam Constitutional:      General: He is not in acute distress. HENT:     Head: Normocephalic and atraumatic.  Eyes:     General: No scleral icterus. Cardiovascular:     Rate and Rhythm: Normal rate.  Pulmonary:     Effort: Pulmonary effort is normal. No respiratory distress.  Abdominal:     General: There is no distension.  Musculoskeletal:        General: No deformity. Normal range of motion.     Cervical back: Normal range of motion and neck supple.  Skin:    General: Skin is warm.     Findings: No rash.  Neurological:     Mental Status: He is alert and oriented to person, place, and time. Mental status is at baseline.  Psychiatric:     Comments: Flat      LABORATORY DATA:  I have reviewed the data as listed    Latest Ref Rng & Units 04/02/2023   10:05 AM 11/12/2022   10:10 AM 07/26/2022    1:19 PM  CBC  WBC 4.0 - 10.5 K/uL 6.6  6.6  5.3   Hemoglobin 13.0 - 17.0 g/dL 16.1  09.6  04.5   Hematocrit 39.0 - 52.0 % 41.8  46.6  42.7   Platelets 150 - 400 K/uL 213  233  234       Latest Ref  Rng & Units 11/12/2022   10:10 AM 07/26/2022    1:19 PM 06/08/2021    5:02 PM  CMP  Glucose 70 - 99 mg/dL  95  81   BUN 8 - 23 mg/dL  10  11   Creatinine 4.09 - 1.24 mg/dL  8.11  9.14   Sodium 782 - 145 mmol/L  139  139   Potassium 3.5 - 5.1 mmol/L  3.6  3.9   Chloride 98 - 111 mmol/L  103  104   CO2 22 - 32 mmol/L  28  28   Calcium 8.9 - 10.3 mg/dL  8.6  9.5   Total Protein 6.5 - 8.1 g/dL 7.1   7.5   Total Bilirubin 0.3 - 1.2 mg/dL 0.4   0.7   Alkaline Phos 38 - 126 U/L 83   91   AST 15 - 41 U/L 20   24   ALT 0 - 44 U/L 17   21       RADIOGRAPHIC STUDIES: I have personally reviewed the radiological images as listed and agreed with the findings in the report. No results found.

## 2023-04-15 NOTE — Assessment & Plan Note (Addendum)
Labs are reviewed and discussed with patient. Lab Results  Component Value Date   HGB 13.8 04/02/2023   TIBC 300 04/02/2023   IRONPCTSAT 19 04/02/2023   FERRITIN 83 04/02/2023    Hemoglobin and iron panel are both within normal limits. Patient may stop oral iron supplementation Recommend patient to take ferrous sulfate 325 mg every other day.

## 2023-05-09 ENCOUNTER — Ambulatory Visit (INDEPENDENT_AMBULATORY_CARE_PROVIDER_SITE_OTHER): Payer: 59 | Admitting: Podiatry

## 2023-05-09 ENCOUNTER — Encounter: Payer: Self-pay | Admitting: Podiatry

## 2023-05-09 DIAGNOSIS — M79675 Pain in left toe(s): Secondary | ICD-10-CM | POA: Diagnosis not present

## 2023-05-09 DIAGNOSIS — I739 Peripheral vascular disease, unspecified: Secondary | ICD-10-CM | POA: Diagnosis not present

## 2023-05-09 DIAGNOSIS — B351 Tinea unguium: Secondary | ICD-10-CM | POA: Diagnosis not present

## 2023-05-09 DIAGNOSIS — Q828 Other specified congenital malformations of skin: Secondary | ICD-10-CM | POA: Diagnosis not present

## 2023-05-09 DIAGNOSIS — M79674 Pain in right toe(s): Secondary | ICD-10-CM | POA: Diagnosis not present

## 2023-05-19 NOTE — Progress Notes (Signed)
°  Subjective:  Patient ID: Jeremiah Nguyen, male    DOB: 03/30/1956,  MRN: [id number]  67 y.o. male presents with  at risk foot care. Patient has h/o PAD and painful porokeratotic lesion(s) right foot and painful mycotic toenails that limit ambulation. Painful toenails interfere with ambulation. Aggravating factors include wearing enclosed shoe gear. Pain is relieved with periodic professional debridement. Painful porokeratotic lesions are aggravated when weightbearing with and without shoegear. Pain is relieved with periodic professional debridement.Marland Kitchen    PCP: Armando Gang, FNP.  New problem(s): None.   Review of Systems: Negative except as noted in the HPI.   No Known Allergies  Objective:  There were no vitals filed for this visit. Constitutional Patient is a pleasant 67 y.o. African American male in NAD. AAO x 3.  Vascular Capillary fill time to digits <4 seconds.  DP/PT pulse(s) are nonpalpable b/l lower extremities. Pedal hair absent b/l. Lower extremity skin temperature gradient warm to cool b/l. No pain with calf compression b/l. No cyanosis or clubbing noted. No ischemia nor gangrene noted b/l.   Neurologic Protective sensation intact 5/5 intact bilaterally with 10g monofilament b/l. Vibratory sensation intact b/l. No clonus b/l.   Dermatologic Pedal skin is thin, shiny and atrophic b/l.  No open wounds b/l lower extremities. No interdigital macerations b/l lower extremities. Toenails 1-5 b/l elongated, discolored, dystrophic, thickened, crumbly with subungual debris and tenderness to dorsal palpation. Porokeratotic lesion(s) right heel and right second digit. No erythema, no edema, no drainage, no fluctuance.  Orthopedic: Normal muscle strength 5/5 to all lower extremity muscle groups bilaterally. HAV with bunion deformity noted b/l LE. Hammertoe(s) noted to the right second digit.   Last HgA1c:      No data to display           Assessment:   1. Pain due to onychomycosis of  toenails of both feet   2. Porokeratosis   3. PAD (peripheral artery disease) (HCC)    Plan:  -Patient was evaluated today. All questions/concerns addressed on today's visit. -Patient to continue soft, supportive shoe gear daily. -Toenails 1-5 b/l were debrided in length and girth with sterile nail nippers and dremel without iatrogenic bleeding.  -Porokeratotic lesion(s) right heel and R 2nd toe pared and enucleated with sterile currette without incident. Total number of lesions debrided=2. -Patient/POA to call should there be question/concern in the interim.  Return in about 3 months (around 08/07/2023).  Freddie Breech, DPM      [place] LOCATION: [address], [place] [address]                   Office [phone]   [facility] LOCATION: [address] [place], [place] [address] Office [phone] Freddie Breech, DPM

## 2023-07-12 ENCOUNTER — Inpatient Hospital Stay: Payer: 59

## 2023-07-12 ENCOUNTER — Emergency Department: Payer: 59

## 2023-07-12 ENCOUNTER — Inpatient Hospital Stay
Admission: EM | Admit: 2023-07-12 | Discharge: 2023-08-12 | DRG: 871 | Payer: 59 | Attending: Internal Medicine | Admitting: Internal Medicine

## 2023-07-12 ENCOUNTER — Other Ambulatory Visit: Payer: Self-pay

## 2023-07-12 DIAGNOSIS — J09X2 Influenza due to identified novel influenza A virus with other respiratory manifestations: Secondary | ICD-10-CM | POA: Diagnosis not present

## 2023-07-12 DIAGNOSIS — J9601 Acute respiratory failure with hypoxia: Principal | ICD-10-CM | POA: Diagnosis present

## 2023-07-12 DIAGNOSIS — J439 Emphysema, unspecified: Secondary | ICD-10-CM | POA: Diagnosis present

## 2023-07-12 DIAGNOSIS — J9811 Atelectasis: Secondary | ICD-10-CM | POA: Diagnosis not present

## 2023-07-12 DIAGNOSIS — J1008 Influenza due to other identified influenza virus with other specified pneumonia: Secondary | ICD-10-CM | POA: Diagnosis present

## 2023-07-12 DIAGNOSIS — J449 Chronic obstructive pulmonary disease, unspecified: Secondary | ICD-10-CM | POA: Diagnosis not present

## 2023-07-12 DIAGNOSIS — J181 Lobar pneumonia, unspecified organism: Secondary | ICD-10-CM | POA: Diagnosis not present

## 2023-07-12 DIAGNOSIS — Z515 Encounter for palliative care: Secondary | ICD-10-CM | POA: Diagnosis not present

## 2023-07-12 DIAGNOSIS — E874 Mixed disorder of acid-base balance: Secondary | ICD-10-CM | POA: Diagnosis present

## 2023-07-12 DIAGNOSIS — Z1152 Encounter for screening for COVID-19: Secondary | ICD-10-CM | POA: Diagnosis not present

## 2023-07-12 DIAGNOSIS — J690 Pneumonitis due to inhalation of food and vomit: Secondary | ICD-10-CM

## 2023-07-12 DIAGNOSIS — A419 Sepsis, unspecified organism: Principal | ICD-10-CM | POA: Diagnosis present

## 2023-07-12 DIAGNOSIS — J9621 Acute and chronic respiratory failure with hypoxia: Secondary | ICD-10-CM | POA: Diagnosis present

## 2023-07-12 DIAGNOSIS — J189 Pneumonia, unspecified organism: Secondary | ICD-10-CM | POA: Diagnosis not present

## 2023-07-12 DIAGNOSIS — R569 Unspecified convulsions: Secondary | ICD-10-CM

## 2023-07-12 DIAGNOSIS — K219 Gastro-esophageal reflux disease without esophagitis: Secondary | ICD-10-CM | POA: Diagnosis present

## 2023-07-12 DIAGNOSIS — J441 Chronic obstructive pulmonary disease with (acute) exacerbation: Secondary | ICD-10-CM | POA: Diagnosis present

## 2023-07-12 DIAGNOSIS — F17210 Nicotine dependence, cigarettes, uncomplicated: Secondary | ICD-10-CM | POA: Diagnosis present

## 2023-07-12 DIAGNOSIS — G9341 Metabolic encephalopathy: Secondary | ICD-10-CM | POA: Diagnosis present

## 2023-07-12 DIAGNOSIS — R040 Epistaxis: Secondary | ICD-10-CM | POA: Clinically undetermined

## 2023-07-12 DIAGNOSIS — Z7401 Bed confinement status: Secondary | ICD-10-CM

## 2023-07-12 DIAGNOSIS — J159 Unspecified bacterial pneumonia: Secondary | ICD-10-CM | POA: Diagnosis present

## 2023-07-12 DIAGNOSIS — Z681 Body mass index (BMI) 19 or less, adult: Secondary | ICD-10-CM

## 2023-07-12 DIAGNOSIS — R739 Hyperglycemia, unspecified: Secondary | ICD-10-CM | POA: Diagnosis present

## 2023-07-12 DIAGNOSIS — G40909 Epilepsy, unspecified, not intractable, without status epilepticus: Secondary | ICD-10-CM | POA: Diagnosis present

## 2023-07-12 DIAGNOSIS — J101 Influenza due to other identified influenza virus with other respiratory manifestations: Secondary | ICD-10-CM

## 2023-07-12 DIAGNOSIS — R6521 Severe sepsis with septic shock: Secondary | ICD-10-CM | POA: Diagnosis present

## 2023-07-12 DIAGNOSIS — K5900 Constipation, unspecified: Secondary | ICD-10-CM | POA: Diagnosis not present

## 2023-07-12 DIAGNOSIS — J9622 Acute and chronic respiratory failure with hypercapnia: Secondary | ICD-10-CM | POA: Diagnosis present

## 2023-07-12 DIAGNOSIS — J440 Chronic obstructive pulmonary disease with acute lower respiratory infection: Secondary | ICD-10-CM | POA: Diagnosis present

## 2023-07-12 DIAGNOSIS — F209 Schizophrenia, unspecified: Secondary | ICD-10-CM | POA: Diagnosis present

## 2023-07-12 DIAGNOSIS — Z7984 Long term (current) use of oral hypoglycemic drugs: Secondary | ICD-10-CM

## 2023-07-12 DIAGNOSIS — I110 Hypertensive heart disease with heart failure: Secondary | ICD-10-CM | POA: Diagnosis present

## 2023-07-12 DIAGNOSIS — I5022 Chronic systolic (congestive) heart failure: Secondary | ICD-10-CM | POA: Diagnosis present

## 2023-07-12 DIAGNOSIS — G4733 Obstructive sleep apnea (adult) (pediatric): Secondary | ICD-10-CM | POA: Diagnosis present

## 2023-07-12 DIAGNOSIS — J09X1 Influenza due to identified novel influenza A virus with pneumonia: Secondary | ICD-10-CM | POA: Diagnosis present

## 2023-07-12 DIAGNOSIS — E440 Moderate protein-calorie malnutrition: Secondary | ICD-10-CM | POA: Diagnosis present

## 2023-07-12 DIAGNOSIS — Z7982 Long term (current) use of aspirin: Secondary | ICD-10-CM

## 2023-07-12 DIAGNOSIS — R0603 Acute respiratory distress: Secondary | ICD-10-CM | POA: Diagnosis not present

## 2023-07-12 DIAGNOSIS — J9602 Acute respiratory failure with hypercapnia: Secondary | ICD-10-CM | POA: Diagnosis present

## 2023-07-12 DIAGNOSIS — N179 Acute kidney failure, unspecified: Secondary | ICD-10-CM | POA: Diagnosis present

## 2023-07-12 DIAGNOSIS — Z8659 Personal history of other mental and behavioral disorders: Secondary | ICD-10-CM

## 2023-07-12 DIAGNOSIS — R54 Age-related physical debility: Secondary | ICD-10-CM | POA: Diagnosis present

## 2023-07-12 DIAGNOSIS — Z751 Person awaiting admission to adequate facility elsewhere: Secondary | ICD-10-CM

## 2023-07-12 DIAGNOSIS — Z79899 Other long term (current) drug therapy: Secondary | ICD-10-CM

## 2023-07-12 LAB — GLUCOSE, CAPILLARY
Glucose-Capillary: 138 mg/dL — ABNORMAL HIGH (ref 70–99)
Glucose-Capillary: 163 mg/dL — ABNORMAL HIGH (ref 70–99)
Glucose-Capillary: 164 mg/dL — ABNORMAL HIGH (ref 70–99)
Glucose-Capillary: 178 mg/dL — ABNORMAL HIGH (ref 70–99)

## 2023-07-12 LAB — URINALYSIS, W/ REFLEX TO CULTURE (INFECTION SUSPECTED)
Bacteria, UA: NONE SEEN
Bilirubin Urine: NEGATIVE
Glucose, UA: >=500 mg/dL — AB
Ketones, ur: NEGATIVE mg/dL
Leukocytes,Ua: NEGATIVE
Nitrite: NEGATIVE
Protein, ur: NEGATIVE mg/dL
Specific Gravity, Urine: 1.024 (ref 1.005–1.030)
pH: 5 (ref 5.0–8.0)

## 2023-07-12 LAB — LACTIC ACID, PLASMA
Lactic Acid, Venous: 4.4 mmol/L (ref 0.5–1.9)
Lactic Acid, Venous: 1.5 mmol/L (ref 0.5–1.9)
Lactic Acid, Venous: 1.9 mmol/L (ref 0.5–1.9)
Lactic Acid, Venous: 1.6 mmol/L (ref 0.5–1.9)

## 2023-07-12 LAB — BLOOD GAS, ARTERIAL
Acid-base deficit: 5.8 mmol/L — ABNORMAL HIGH (ref 0.0–2.0)
Acid-base deficit: 3.8 mmol/L — ABNORMAL HIGH (ref 0.0–2.0)
Bicarbonate: 23.3 mmol/L (ref 20.0–28.0)
Bicarbonate: 24.4 mmol/L (ref 20.0–28.0)
FIO2: 0.4 %
MECHVT: 570 mL
O2 Saturation: 96.6 %
O2 Saturation: 87.1 %
PEEP: 5 cmH2O
Patient temperature: 37
Patient temperature: 37
RATE: 14 {breaths}/min
pCO2 arterial: 61 mm[Hg] — ABNORMAL HIGH (ref 32–48)
pCO2 arterial: 57 mm[Hg] — ABNORMAL HIGH (ref 32–48)
pH, Arterial: 7.19 — CL (ref 7.35–7.45)
pH, Arterial: 7.24 — ABNORMAL LOW (ref 7.35–7.45)
pO2, Arterial: 85 mm[Hg] (ref 83–108)
pO2, Arterial: 58 mm[Hg] — ABNORMAL LOW (ref 83–108)

## 2023-07-12 LAB — URINE DRUG SCREEN, QUALITATIVE (ARMC ONLY)
Amphetamines, Ur Screen: NOT DETECTED
Barbiturates, Ur Screen: NOT DETECTED
Benzodiazepine, Ur Scrn: POSITIVE — AB
Cannabinoid 50 Ng, Ur ~~LOC~~: NOT DETECTED
Cocaine Metabolite,Ur ~~LOC~~: NOT DETECTED
MDMA (Ecstasy)Ur Screen: NOT DETECTED
Methadone Scn, Ur: NOT DETECTED
Opiate, Ur Screen: NOT DETECTED
Phencyclidine (PCP) Ur S: NOT DETECTED
Tricyclic, Ur Screen: POSITIVE — AB

## 2023-07-12 LAB — CBC WITH DIFFERENTIAL/PLATELET
Abs Immature Granulocytes: 0.09 10*3/uL — ABNORMAL HIGH (ref 0.00–0.07)
Basophils Absolute: 0.1 10*3/uL (ref 0.0–0.1)
Basophils Relative: 1 %
Eosinophils Absolute: 0.3 10*3/uL (ref 0.0–0.5)
Eosinophils Relative: 3 %
HCT: 46.8 % (ref 39.0–52.0)
Hemoglobin: 15.4 g/dL (ref 13.0–17.0)
Immature Granulocytes: 1 %
Lymphocytes Relative: 4 %
Lymphs Abs: 0.4 10*3/uL — ABNORMAL LOW (ref 0.7–4.0)
MCH: 30.6 pg (ref 26.0–34.0)
MCHC: 32.9 g/dL (ref 30.0–36.0)
MCV: 92.9 fL (ref 80.0–100.0)
Monocytes Absolute: 0.5 10*3/uL (ref 0.1–1.0)
Monocytes Relative: 5 %
Neutro Abs: 8.3 10*3/uL — ABNORMAL HIGH (ref 1.7–7.7)
Neutrophils Relative %: 86 %
Platelets: 162 10*3/uL (ref 150–400)
RBC: 5.04 MIL/uL (ref 4.22–5.81)
RDW: 14.5 % (ref 11.5–15.5)
WBC: 9.6 10*3/uL (ref 4.0–10.5)
nRBC: 0 % (ref 0.0–0.2)

## 2023-07-12 LAB — COMPREHENSIVE METABOLIC PANEL
ALT: 29 U/L (ref 0–44)
AST: 116 U/L — ABNORMAL HIGH (ref 15–41)
Albumin: 3.7 g/dL (ref 3.5–5.0)
Alkaline Phosphatase: 58 U/L (ref 38–126)
Anion gap: 13 (ref 5–15)
BUN: 23 mg/dL (ref 8–23)
CO2: 24 mmol/L (ref 22–32)
Calcium: 8.6 mg/dL — ABNORMAL LOW (ref 8.9–10.3)
Chloride: 99 mmol/L (ref 98–111)
Creatinine, Ser: 2 mg/dL — ABNORMAL HIGH (ref 0.61–1.24)
GFR, Estimated: 36 mL/min — ABNORMAL LOW (ref >60)
Glucose, Bld: 130 mg/dL — ABNORMAL HIGH (ref 70–99)
Potassium: 4.5 mmol/L (ref 3.5–5.1)
Sodium: 136 mmol/L (ref 135–145)
Total Bilirubin: 0.6 mg/dL (ref 0.0–1.2)
Total Protein: 7.1 g/dL (ref 6.5–8.1)

## 2023-07-12 LAB — BLOOD GAS, VENOUS
Acid-base deficit: 3 mmol/L — ABNORMAL HIGH (ref 0.0–2.0)
Bicarbonate: 27.4 mmol/L (ref 20.0–28.0)
O2 Saturation: 69.8 %
Patient temperature: 37
pCO2, Ven: 75 mm[Hg] (ref 44–60)
pH, Ven: 7.17 — CL (ref 7.25–7.43)
pO2, Ven: 47 mm[Hg] — ABNORMAL HIGH (ref 32–45)

## 2023-07-12 LAB — PROTIME-INR
INR: 1.1 (ref 0.8–1.2)
Prothrombin Time: 14.5 s (ref 11.4–15.2)

## 2023-07-12 LAB — PROCALCITONIN: Procalcitonin: 3.56 ng/mL

## 2023-07-12 LAB — RESP PANEL BY RT-PCR (RSV, FLU A&B, COVID)  RVPGX2
Influenza A by PCR: POSITIVE — AB
Influenza B by PCR: NEGATIVE
Resp Syncytial Virus by PCR: NEGATIVE
SARS Coronavirus 2 by RT PCR: NEGATIVE

## 2023-07-12 LAB — TROPONIN I (HIGH SENSITIVITY): Troponin I (High Sensitivity): 48 ng/L — ABNORMAL HIGH (ref <18)

## 2023-07-12 LAB — APTT: aPTT: 24 s (ref 24–36)

## 2023-07-12 LAB — MRSA NEXT GEN BY PCR, NASAL: MRSA by PCR Next Gen: NOT DETECTED

## 2023-07-12 MED ORDER — METHYLPREDNISOLONE SODIUM SUCC 40 MG IJ SOLR
40.0000 mg | INTRAMUSCULAR | Status: DC
  Administered 2023-07-13 – 2023-07-27 (×15): 40 mg via INTRAVENOUS
  Filled 2023-07-12 (×15): qty 1

## 2023-07-12 MED ORDER — IOHEXOL 350 MG/ML SOLN
75.0000 mL | Freq: Once | INTRAVENOUS | Status: AC | PRN
  Administered 2023-07-12: 75 mL via INTRAVENOUS

## 2023-07-12 MED ORDER — SODIUM CHLORIDE 0.9 % IV BOLUS (SEPSIS)
1000.0000 mL | Freq: Once | INTRAVENOUS | Status: AC
  Administered 2023-07-12: 1000 mL via INTRAVENOUS

## 2023-07-12 MED ORDER — IPRATROPIUM-ALBUTEROL 0.5-2.5 (3) MG/3ML IN SOLN
3.0000 mL | RESPIRATORY_TRACT | Status: DC
  Administered 2023-07-12 – 2023-07-13 (×5): 3 mL via RESPIRATORY_TRACT
  Filled 2023-07-12 (×2): qty 3
  Filled 2023-07-12: qty 42
  Filled 2023-07-12 (×2): qty 3

## 2023-07-12 MED ORDER — PANTOPRAZOLE SODIUM 40 MG IV SOLR
40.0000 mg | Freq: Every day | INTRAVENOUS | Status: DC
  Administered 2023-07-13 – 2023-07-15 (×3): 40 mg via INTRAVENOUS
  Filled 2023-07-12 (×3): qty 10

## 2023-07-12 MED ORDER — NOREPINEPHRINE 4 MG/250ML-% IV SOLN
0.0000 ug/min | INTRAVENOUS | Status: DC
  Administered 2023-07-12: 13 ug/min via INTRAVENOUS
  Administered 2023-07-12: 5 ug/min via INTRAVENOUS
  Administered 2023-07-13: 14 ug/min via INTRAVENOUS
  Administered 2023-07-13: 13 ug/min via INTRAVENOUS
  Filled 2023-07-12 (×4): qty 250

## 2023-07-12 MED ORDER — INSULIN ASPART 100 UNIT/ML IJ SOLN
0.0000 [IU] | INTRAMUSCULAR | Status: DC
  Administered 2023-07-12 (×2): 3 [IU] via SUBCUTANEOUS
  Administered 2023-07-13 (×4): 2 [IU] via SUBCUTANEOUS
  Administered 2023-07-13: 1 [IU] via SUBCUTANEOUS
  Administered 2023-07-14 (×2): 2 [IU] via SUBCUTANEOUS
  Administered 2023-07-14 (×2): 3 [IU] via SUBCUTANEOUS
  Administered 2023-07-14 – 2023-07-15 (×3): 2 [IU] via SUBCUTANEOUS
  Administered 2023-07-15 – 2023-07-16 (×3): 3 [IU] via SUBCUTANEOUS
  Filled 2023-07-12 (×17): qty 1

## 2023-07-12 MED ORDER — ETOMIDATE 2 MG/ML IV SOLN
20.0000 mg | Freq: Once | INTRAVENOUS | Status: AC
  Administered 2023-07-12: 20 mg via INTRAVENOUS
  Filled 2023-07-12: qty 10

## 2023-07-12 MED ORDER — IPRATROPIUM-ALBUTEROL 0.5-2.5 (3) MG/3ML IN SOLN
9.0000 mL | Freq: Once | RESPIRATORY_TRACT | Status: AC
  Administered 2023-07-12: 9 mL via RESPIRATORY_TRACT
  Filled 2023-07-12: qty 9

## 2023-07-12 MED ORDER — LAMOTRIGINE 25 MG PO TABS
150.0000 mg | ORAL_TABLET | Freq: Two times a day (BID) | ORAL | Status: DC
  Administered 2023-07-12 – 2023-07-15 (×6): 150 mg
  Filled 2023-07-12 (×6): qty 6

## 2023-07-12 MED ORDER — ORAL CARE MOUTH RINSE
15.0000 mL | OROMUCOSAL | Status: DC
  Administered 2023-07-12 – 2023-07-15 (×37): 15 mL via OROMUCOSAL

## 2023-07-12 MED ORDER — SODIUM CHLORIDE 0.9 % IV SOLN
2.0000 g | Freq: Once | INTRAVENOUS | Status: AC
  Administered 2023-07-12: 2 g via INTRAVENOUS
  Filled 2023-07-12: qty 20

## 2023-07-12 MED ORDER — SODIUM CHLORIDE 0.9 % IV SOLN
500.0000 mg | Freq: Once | INTRAVENOUS | Status: AC
  Administered 2023-07-12: 500 mg via INTRAVENOUS
  Filled 2023-07-12: qty 5

## 2023-07-12 MED ORDER — ORAL CARE MOUTH RINSE: 15.0000 mL | OROMUCOSAL | Status: DC | PRN

## 2023-07-12 MED ORDER — METHYLPREDNISOLONE SODIUM SUCC 125 MG IJ SOLR
125.0000 mg | INTRAMUSCULAR | Status: AC
  Administered 2023-07-12: 125 mg via INTRAVENOUS
  Filled 2023-07-12: qty 2

## 2023-07-12 MED ORDER — ROCURONIUM BROMIDE 10 MG/ML (PF) SYRINGE
80.0000 mg | PREFILLED_SYRINGE | Freq: Once | INTRAVENOUS | Status: AC
  Administered 2023-07-12: 80 mg via INTRAVENOUS
  Filled 2023-07-12: qty 10

## 2023-07-12 MED ORDER — SODIUM CHLORIDE 0.9 % IV SOLN
2.0000 g | INTRAVENOUS | Status: AC
  Administered 2023-07-13 – 2023-07-16 (×4): 2 g via INTRAVENOUS
  Filled 2023-07-12 (×4): qty 20

## 2023-07-12 MED ORDER — OSELTAMIVIR PHOSPHATE 30 MG PO CAPS
30.0000 mg | ORAL_CAPSULE | Freq: Two times a day (BID) | ORAL | Status: DC
  Administered 2023-07-12 – 2023-07-14 (×5): 30 mg
  Filled 2023-07-12 (×6): qty 1

## 2023-07-12 MED ORDER — SODIUM CHLORIDE 0.9 % IV BOLUS
1000.0000 mL | Freq: Once | INTRAVENOUS | Status: AC
  Administered 2023-07-12: 1000 mL via INTRAVENOUS

## 2023-07-12 MED ORDER — HEPARIN SODIUM (PORCINE) 5000 UNIT/ML IJ SOLN
5000.0000 [IU] | Freq: Three times a day (TID) | INTRAMUSCULAR | Status: DC
  Administered 2023-07-12 – 2023-07-14 (×5): 5000 [IU] via SUBCUTANEOUS
  Filled 2023-07-12 (×5): qty 1

## 2023-07-12 MED ORDER — MIDAZOLAM-SODIUM CHLORIDE 100-0.9 MG/100ML-% IV SOLN
0.5000 mg/h | INTRAVENOUS | Status: DC
  Administered 2023-07-12: 1 mg/h via INTRAVENOUS
  Administered 2023-07-13: 2.5 mg/h via INTRAVENOUS
  Administered 2023-07-15: 4 mg/h via INTRAVENOUS
  Filled 2023-07-12 (×3): qty 100

## 2023-07-12 MED ORDER — SODIUM CHLORIDE 0.9 % IV SOLN
250.0000 mL | INTRAVENOUS | Status: AC
  Administered 2023-07-12: 250 mL via INTRAVENOUS

## 2023-07-12 MED ORDER — FENTANYL 2500MCG IN NS 250ML (10MCG/ML) PREMIX INFUSION
0.0000 ug/h | INTRAVENOUS | Status: DC
  Administered 2023-07-12: 25 ug/h via INTRAVENOUS
  Administered 2023-07-14: 50 ug/h via INTRAVENOUS
  Filled 2023-07-12 (×2): qty 250

## 2023-07-12 MED ORDER — SODIUM CHLORIDE 0.9 % IV SOLN
500.0000 mg | INTRAVENOUS | Status: AC
  Administered 2023-07-13 – 2023-07-16 (×4): 500 mg via INTRAVENOUS
  Filled 2023-07-12 (×4): qty 5

## 2023-07-12 MED ORDER — SODIUM CHLORIDE 0.9 % IV BOLUS (SEPSIS)
250.0000 mL | Freq: Once | INTRAVENOUS | Status: AC
  Administered 2023-07-12: 250 mL via INTRAVENOUS

## 2023-07-12 MED ORDER — CHLORHEXIDINE GLUCONATE CLOTH 2 % EX PADS
6.0000 | MEDICATED_PAD | Freq: Every day | CUTANEOUS | Status: DC
  Administered 2023-07-13 – 2023-07-15 (×3): 6 via TOPICAL

## 2023-07-12 NOTE — ED Notes (Signed)
 Intubated 8.0 tube, 24 at lip without difficulty by Dr. Scotty Court.

## 2023-07-12 NOTE — H&P (Signed)
 NAME:  Jeremiah Nguyen, MRN:  [id number], DOB:  May 28, 1955, LOS: 0 ADMISSION DATE:  07/12/2023, CONSULTATION DATE:  07/12/23 REFERRING MD:  Dr. Scotty Court, CHIEF COMPLAINT:  AMS   Brief Pt Description / Synopsis:  68 year old male admitted with acute metabolic encephalopathy, severe sepsis with septic shock, and acute hypoxic and hypercapnic respiratory failure in the setting of acute COPD exacerbation due to influenza A infection and superimposed bacterial pneumonia requiring intubation and mechanical ventilation.  History of Present Illness:  Jeremiah Nguyen is a 68 year old male with a past medical history significant for COPD, alcohol dependence, and seizure disorder who presented to [facility] ED on 07/12/2023 from his group home due to altered mental status.  Patient is currently intubated and sedated and no family is present, therefore history is obtained from chart review.  Per report he has been altered over the last 24 hours, not taking his medications this morning.  Upon EMS arrival he was noted to be hypoxic with O2 sats of 60% on room air, and they noted wheezing and stridor which they gave IM epi.  On arrival to the ED he remained somnolent and altered, oriented only to self.  Given his respiratory distress he was placed on BiPAP.  ED did note that his left arm and left leg demonstrated relative weakness compared to the right.  ED Course: Initial Vital Signs: Temperature 97.6 F axillary, respiratory rate 26, pulse 95, blood pressure 90/58, SpO2 86% on BiPAP Significant Labs: Glucose 130, BUN 23, creatinine 2.0, AST 116, WBC 9.6, procalcitonin 3.5, lactic acid 4.4 VBG: pH 7.17/pCO2 75/pO2 47/bicarb 27.4 VBG post BiPAP: pH 7.19/pCO2 61/pO2 85/bicarb 23.3 Imaging Chest X-ray>>FINDINGS: Cardiopericardial silhouette is at upper limits of normal for size. Diffuse interstitial opacity is similar to prior. Interval improvement in aeration at the right base with persistent left base atelectasis or  infiltrate. No substantial pleural effusion. No acute bony abnormality. Telemetry leads overlie the chest. Medications Administered: 3.25 L of IV fluid boluses, IV azithromycin and ceftriaxone, 125 mg Solu-Medrol  Given his persistent encephalopathy, respiratory failure and respiratory acidosis, he was intubated by ED provider.  PCCM asked to admit for further workup and treatment.  Please see "Significant Hospital Events" section below for full detailed hospital course.   Pertinent  Medical History   Past Medical History:  Diagnosis Date   COPD (chronic obstructive pulmonary disease) (HCC)    Eating disorder    GERD (gastroesophageal reflux disease)    Liver disease    Seizures (HCC)    Sleep apnea     Micro Data:  2/21: COVID/RSV/flu PCR>> + influenza A 2/21: Blood culture x 2>> 2/21: Strep pneumo urinary antigen>> 2/21: Legionella urine antigen>> 2/21: Mycoplasma pneumoniae IgM>> 2/21: MRSA PCR>> 2/21: Tracheal aspirate>>  Antimicrobials:   Anti-infectives (From admission, onward)    Start     Dose/Rate Route Frequency Ordered Stop   07/13/23 1000  azithromycin (ZITHROMAX) 500 mg in sodium chloride 0.9 % 250 mL IVPB        500 mg 250 mL/hr over 60 Minutes Intravenous Every 24 hours 07/12/23 1312 07/17/23 0959   07/13/23 1000  cefTRIAXone (ROCEPHIN) 2 g in sodium chloride 0.9 % 100 mL IVPB        2 g 200 mL/hr over 30 Minutes Intravenous Every 24 hours 07/12/23 1312     07/12/23 0815  cefTRIAXone (ROCEPHIN) 2 g in sodium chloride 0.9 % 100 mL IVPB        2 g 200 mL/hr over 30  Minutes Intravenous Once 07/12/23 0806 07/12/23 0900   07/12/23 0815  azithromycin (ZITHROMAX) 500 mg in sodium chloride 0.9 % 250 mL IVPB        500 mg 250 mL/hr over 60 Minutes Intravenous  Once 07/12/23 0806 07/12/23 0930        Significant Hospital Events: Including procedures, antibiotic start and stop dates in addition to other pertinent events   2/21: Presented to ED from group  home with altered mental status, hypertension.  Required intubation in the ED by ED provider and initiation of Levophed despite IV fluid resuscitation.  PCCM asked to admit.  Interim History / Subjective:  As outlined above under "Significant Hospital Events" section   Objective   Blood pressure (!) 78/54, pulse 69, temperature 97.6 F (36.4 C), temperature source Axillary, resp. rate 20, height 5\' 8"  (1.727 m), weight 68 kg, SpO2 99%.    FiO2 (%):  [65 %] 65 % PEEP:  [6 cmH20] 6 cmH20   Intake/Output Summary (Last 24 hours) at 07/12/2023 1316 Last data filed at 07/12/2023 1125 Gross per 24 hour  Intake 2350 ml  Output --  Net 2350 ml   Filed Weights   07/12/23 0858  Weight: 68 kg    Examination: General: Acute on chronically ill-appearing male, laying in bed, intubated and sedated, no acute distress HENT: Atraumatic, normocephalic, neck supple, no JVD, orally intubated Lungs: Clear diminished breath sounds throughout, even, synchronous with the vent, nonlabored Cardiovascular: Regular rate and rhythm, S1-S2, no murmurs, rubs, gallops Abdomen: Flat, nondistended, nontender, no guarding or rebound tenderness, bowel sounds positive x 4 Extremities: Normal bulk and tone, no deformities, no edema Neuro: Sedated, currently not following commands or withdrawing from pain, pupils PERRLA at 2 mm bilaterally GU: Placing Foley currently due to urinary retention  Resolved Hospital Problem list     Assessment & Plan:   #Acute Hypoxic & Hypercapnic Respiratory Failure in the setting of Acute COPD Exacerbation from Influenza A infection and superimposed bacterial pneumonia PMHx: COPD, OSA -Full vent support, implement lung protective strategies -Plateau pressures less than 30 cm H20 -Wean FiO2 & PEEP as tolerated to maintain O2 sats 88 to 92% -Follow intermittent Chest X-ray & ABG as needed -Spontaneous Breathing Trials when respiratory parameters met and mental status  permits -Implement VAP Bundle -Bronchodilators -IV steroids -ABX as above -CTa Chest is pending  #Shock: Septic -Continuous cardiac monitoring -Maintain MAP >65 -IV fluids -Vasopressors as needed to maintain MAP goal -Trend lactic acid until normalized -Trend HS Troponin until peaked -Echocardiogram pending  #Meets SIRS Criteria on presentation (RR 26, HR 95) #Severe Sepsis  #Influenza A Infection #Superimposed Bacterial Pneumonia -Monitor fever curve -Trend WBC's & Procalcitonin -Follow cultures as above -Continue empiric Azithromycin, Ceftriaxone, and Tamiflu pending cultures & sensitivities  #Acute Kidney Injury -Monitor I&O's / urinary output -Follow BMP -Ensure adequate renal perfusion -Avoid nephrotoxic agents as able -Replace electrolytes as indicated ~ Pharmacy following for assistance with electrolyte replacement -IV fluids  #Hyperglycemia -CBG's q4h; Target range of 140 to 180 -SSI -Follow ICU Hypo/Hyperglycemia protocol -Check Hgb A1c  #Acute Metabolic Encephalopathy, suspect CO2 Narcosis #Sedation needs in setting of mechanical ventilation  #Questionable Lateralizing/weakness of LUE/LLE noted in ED PMHx: seizures -Treatment of hypercapnia and metabolic derangements as outlined above -Maintain a RASS goal of 0 to -1 -Fentanyl and Versed to maintain RASS goal -Avoid sedating medications as able -Daily wake up assessment -CT Head is pending -Obtain EEG -His last known well time is not known, was reported  altered since 2/20, so not a TPA candidate, may ultimately need MRI once he is hemodynamically stable enough to transfer off the floor  -UDS is pending      Pt is critically ill with multiorgan failure. Prognosis is guarded, high risk for further decompensation, cardiac arrest, and death.  Given current critical illness superimposed on multiple chronic co-morbidities and advanced age, overall long term prognosis is poor.  Recommend consideration of  DNR/DNI status.  Will consult Palliative Care to assist with GOC discussions.    Best Practice (right click and "Reselect all SmartList Selections" daily)   Diet/type: NPO DVT prophylaxis: prophylactic heparin  GI prophylaxis: PPI Lines: N/A Foley:  Yes, and it is still needed Code Status:  full code Last date of multidisciplinary goals of care discussion [N/A]  2/21: Will update family when they arrive at bedside on plan of care.  Labs   CBC: Recent Labs  Lab 07/12/23 0812  WBC 9.6  NEUTROABS 8.3*  HGB 15.4  HCT 46.8  MCV 92.9  PLT 162    Basic Metabolic Panel: Recent Labs  Lab 07/12/23 0812  NA 136  K 4.5  CL 99  CO2 24  GLUCOSE 130*  BUN 23  CREATININE 2.00*  CALCIUM 8.6*   GFR: Estimated Creatinine Clearance: 34 mL/min (A) (by C-G formula based on SCr of 2 mg/dL (H)). Recent Labs  Lab 07/12/23 0812 07/12/23 1248  PROCALCITON 3.56  --   WBC 9.6  --   LATICACIDVEN 4.4* 1.5    Liver Function Tests: Recent Labs  Lab 07/12/23 0812  AST 116*  ALT 29  ALKPHOS 58  BILITOT 0.6  PROT 7.1  ALBUMIN 3.7   No results for input(s): "LIPASE", "AMYLASE" in the last 168 hours. No results for input(s): "AMMONIA" in the last 168 hours.  ABG    Component Value Date/Time   PHART 7.19 (LL) 07/12/2023 1051   PCO2ART 61 (H) 07/12/2023 1051   PO2ART 85 07/12/2023 1051   HCO3 23.3 07/12/2023 1051   ACIDBASEDEF 5.8 (H) 07/12/2023 1051   O2SAT 96.6 07/12/2023 1051     Coagulation Profile: Recent Labs  Lab 07/12/23 0812  INR 1.1    Cardiac Enzymes: No results for input(s): "CKTOTAL", "CKMB", "CKMBINDEX", "TROPONINI" in the last 168 hours.  HbA1C: Hgb A1c MFr Bld  Date/Time Value Ref Range Status  01/03/2018 10:17 AM 5.2 <5.7 % of total Hgb Final    Comment:    For the purpose of screening for the presence of diabetes: . <5.7%       Consistent with the absence of diabetes 5.7-6.4%    Consistent with increased risk for diabetes              (prediabetes) > or =6.5%  Consistent with diabetes . This assay result is consistent with a decreased risk of diabetes. . Currently, no consensus exists regarding use of hemoglobin A1c for diagnosis of diabetes in children. . According to American Diabetes Association (ADA) guidelines, hemoglobin A1c <7.0% represents optimal control in non-pregnant diabetic patients. Different metrics may apply to specific patient populations.  Standards of Medical Care in Diabetes(ADA). .     CBG: No results for input(s): "GLUCAP" in the last 168 hours.  Review of Systems:   Unable to assess due to intubation/sedation/critical illness   Past Medical History:  He,  has a past medical history of COPD (chronic obstructive pulmonary disease) (HCC), Eating disorder, GERD (gastroesophageal reflux disease), Liver disease, Seizures (HCC), and Sleep apnea.  Surgical History:   Past Surgical History:  Procedure Laterality Date   COLONOSCOPY WITH PROPOFOL N/A 12/05/2020   Procedure: COLONOSCOPY WITH PROPOFOL;  Surgeon: Regis Bill, MD;  Location: [facility] ENDOSCOPY;  Service: Endoscopy;  Laterality: N/A;   ESOPHAGOGASTRODUODENOSCOPY (EGD) WITH PROPOFOL N/A 02/25/2018   Procedure: ESOPHAGOGASTRODUODENOSCOPY (EGD) WITH PROPOFOL;  Surgeon: Wyline Mood, MD;  Location: [facility] ENDOSCOPY;  Service: Gastroenterology;  Laterality: N/A;   LESION EXCISION Left 12-15-13   follicular cyst   SKIN BIOPSY       Social History:   reports that he has been smoking cigarettes. He has a 15 pack-year smoking history. He has never used smokeless tobacco. He reports that he does not drink alcohol and does not use drugs.   Family History:  His family history includes Cancer in his mother.   Allergies No Known Allergies   Home Medications  Prior to Admission medications   Medication Sig Start Date End Date Taking? Authorizing Provider  ascorbic acid (VITAMIN C) 500 MG tablet Take 500 mg by mouth daily.   Yes  [provider]  aspirin 81 MG chewable tablet Chew by mouth daily.   Yes [provider]  baclofen (LIORESAL) 10 MG tablet TAKE ONE TABLET BY MOUTH FOUR TIMES A DAY 12/17/19  Yes Malfi, Jodelle Gross, FNP  BREZTRI AEROSPHERE 160-9-4.8 MCG/ACT AERO Inhale into the lungs. 06/18/22  Yes [provider]  chlorproMAZINE (THORAZINE) 50 MG tablet TAKE ONE TABLET BY MOUTH FOUR TIMES A DAY 12/17/19  Yes Malfi, Jodelle Gross, FNP  Cholecalciferol (VITAMIN D3) 1.25 MG (50000 UT) CAPS Take 50,000 Units by mouth once a week.   Yes [provider]  empagliflozin (JARDIANCE) 25 MG TABS tablet Take 25 mg by mouth daily.   Yes [provider]  ferrous sulfate 325 (65 FE) MG EC tablet Take 1 tablet (325 mg total) by mouth every other day. 03/15/23  Yes [name], MD  folic acid (FOLVITE) 1 MG tablet Take 1 mg by mouth daily.   Yes [provider]  isosorbide mononitrate (IMDUR) 30 MG 24 hr tablet Take 30 mg by mouth daily.   Yes [provider]  lamoTRIgine (LAMICTAL) 150 MG tablet Take 150 mg by mouth 2 (two) times daily. 03/15/22  Yes [provider]  losartan (COZAAR) 25 MG tablet Take 25 mg by mouth daily. 04/13/20  Yes [provider]  metoprolol succinate (TOPROL-XL) 25 MG 24 hr tablet Take 1 tablet by mouth daily. 11/26/22  Yes [provider]  mirtazapine (REMERON) 15 MG tablet TAKE 1 TABLET BY MOUTH AT BEDTIME 10/28/19  Yes Karamalegos, Alexander J, DO  omeprazole (PRILOSEC) 40 MG capsule TAKE ONE CAPSULE BY MOUTH EVERY DAY *DO NOT CRUSH* 01/04/20  Yes Wyline Mood, MD  potassium chloride (KLOR-CON M) 10 MEQ tablet Take 10 mEq by mouth daily.   Yes [provider]  senna (SENOKOT) 8.6 MG TABS tablet Take 1 tablet by mouth at bedtime.   Yes [provider]  sertraline (ZOLOFT) 25 MG tablet Take 1 tablet (25 mg total) by mouth daily. 09/02/18  Yes Galen Manila, NP  Vericiguat (VERQUVO) 10 MG TABS Take 10 mg by  mouth daily.   Yes [provider]     Critical care time: 60 minutes     Harlon Ditty, AGACNP-BC [facility] Prefer epic messenger for cross cover needs If after hours, please call E-link

## 2023-07-12 NOTE — Progress Notes (Signed)
 Per Jeri Modena NP, Ok to use OG

## 2023-07-12 NOTE — ED Triage Notes (Addendum)
"  Group home called out he was altered for the past day and not taking his meds. On our arrival he was A&O, upon moving to stretcher, he was 60% on RA, noticed a stridor sound and wheezing. Unable to obtain IV. Gave 2 doses of IM epi. Initial BP 77/41" per EMS Upon arrival 02 sat 94 on 15L via NRB mask. Lethargic, takes a lot of stimulation to arouse patient to open eyes.

## 2023-07-12 NOTE — Progress Notes (Signed)
 Initial Nutrition Assessment  DOCUMENTATION CODES:   Not applicable  INTERVENTION:   Once appropriate for tube feeds, recommend:  Vital 1.2@55ml /hr- Initiate at 62ml/hr and increase by 55ml/hr q 8 hours until goal rate is reached.   ProSource TF 20- Give 60ml daily via tube, each supplement provides 80kcal and 20g of protein.   Free water flushes 30ml q4 hours to maintain tube patency   Regimen provides 1664kcal/day, 119g/day protein and 1223ml/day of free water  Thiamine 100mg  daily via tube   Pt at high refeed risk; recommend monitor potassium, magnesium and phosphorus labs daily until stable  Daily weights   NUTRITION DIAGNOSIS:   Inadequate oral intake related to inability to eat (pt sedated and ventilated) as evidenced by NPO status.  GOAL:   Provide needs based on ASPEN/SCCM guidelines  MONITOR:   Vent status, Labs, Weight trends, Skin, I & O's, TF tolerance  REASON FOR ASSESSMENT:   Ventilator    ASSESSMENT:   68 y/o male with h/o seizures, etoh abuse, GERD, liver disease, OSA, COPD and resides in a group home who is admitted with acute metabolic encephalopathy, severe sepsis with septic shock, AKI, acute hypoxic and hypercapnic respiratory failure in the setting of acute COPD exacerbation due to influenza A infection and superimposed bacterial pneumonia requiring intubation and mechanical ventilation.  RD working remotely.  Pt sedated and ventilated. OGT in place and was advanced per RN. Will hold on tube feeds today as pt admitted with septic shock. Will plan to initiate tube feeds tomorrow as long as patient is stable. Per chart, pt appears weight stable pta.   Medications reviewed and include: heparin, insulin, solu-medrol, protonix, azithromycin, ceftriaxone, levophed   Labs reviewed: K 4.5 wnl, creat 2.0(H)  Patient is currently intubated on ventilator support MV: 9.2 L/min Temp (24hrs), Avg:96.4 F (35.8 C), Min:95.7 F (35.4 C), Max:98 F (36.7  C)  MAP >63mmHg   NUTRITION - FOCUSED PHYSICAL EXAM: Unable to perform at this time   Diet Order:   Diet Order             Diet NPO time specified  Diet effective now                  EDUCATION NEEDS:   No education needs have been identified at this time  Skin:  Skin Assessment: Reviewed RN Assessment  Last BM:  PTA  Height:   Ht Readings from Last 1 Encounters:  07/12/23 5\' 8"  (1.727 m)    Weight:   Wt Readings from Last 1 Encounters:  07/12/23 72.2 kg    Ideal Body Weight:  70 kg  BMI:  Body mass index is 24.2 kg/m.  Estimated Nutritional Needs:   Kcal:  1619kcal/day  Protein:  110-125g/day  Fluid:  1.8-2.1L/day  Betsey Holiday MS, RD, LDN If unable to be reached, please send secure chat to "RD inpatient" available from 8:00a-4:00p daily

## 2023-07-12 NOTE — Sepsis Progress Note (Signed)
 Sepsis protocol monitored by eLink ?

## 2023-07-12 NOTE — Progress Notes (Signed)
 Bair hugger applied.

## 2023-07-12 NOTE — Progress Notes (Signed)
 CODE SEPSIS - PHARMACY COMMUNICATION  **Broad Spectrum Antibiotics should be administered within 1 hour of Sepsis diagnosis**  Time Code Sepsis Called/Page Received: 0804  Antibiotics Ordered: Ceftriaxone & Z-max  Time of 1st antibiotic administration: 0828  Additional action taken by pharmacy: N/A  Tressie Ellis 07/12/2023  9:09 AM

## 2023-07-12 NOTE — Progress Notes (Signed)
 Patient comes from [facility] group home.

## 2023-07-12 NOTE — Plan of Care (Signed)
 I have tried to call [employer] Sister of patient multiple times, No answer. Unable to leave VM.

## 2023-07-12 NOTE — Plan of Care (Signed)
  Problem: Respiratory: Goal: Ability to maintain a clear airway and adequate ventilation will improve Outcome: Progressing   Problem: Clinical Measurements: Goal: Respiratory complications will improve Outcome: Progressing Goal: Cardiovascular complication will be avoided Outcome: Progressing   Problem: Pain Managment: Goal: General experience of comfort will improve and/or be controlled Outcome: Progressing   Problem: Activity: Goal: Ability to tolerate increased activity will improve Outcome: Not Progressing   Problem: Education: Goal: Knowledge of General Education information will improve Description: Including pain rating scale, medication(s)/side effects and non-pharmacologic comfort measures Outcome: Not Progressing   Problem: Nutrition: Goal: Adequate nutrition will be maintained Outcome: Not Progressing

## 2023-07-12 NOTE — Procedures (Signed)
 Central Venous Catheter Insertion Procedure Note  Jeremiah Nguyen  [id number]  1956/01/06  Date:07/12/23  Time:4:11 PM   Provider Performing:Sonny Anthes D Elvina Sidle   Procedure: Insertion of Non-tunneled Central Venous [phone]) with US guidance ([id number])   Indication(s) Medication administration and Difficult access  Consent Unable to obtain consent due to inability to find a medical decision maker for patient.  All reasonable efforts were made.  Another independent medical provider, Dr Belia Heman , confirmed the benefits of this procedure outweigh the risks.  Anesthesia Topical only with 1% lidocaine   Timeout Verified patient identification, verified procedure, site/side was marked, verified correct patient position, special equipment/implants available, medications/allergies/relevant history reviewed, required imaging and test results available.  Sterile Technique Maximal sterile technique including full sterile barrier drape, hand hygiene, sterile gown, sterile gloves, mask, hair covering, sterile ultrasound probe cover (if used).  Procedure Description Area of catheter insertion was cleaned with chlorhexidine and draped in sterile fashion.  With real-time ultrasound guidance a central venous catheter was placed into the right internal jugular vein. Nonpulsatile blood flow and easy flushing noted in all ports.  The catheter was sutured in place and sterile dressing applied.  Complications/Tolerance None; patient tolerated the procedure well. Chest X-ray is ordered to verify placement for internal jugular or subclavian cannulation.   Chest x-ray is not ordered for femoral cannulation.  EBL Minimal  Specimen(s) None   Line secured at the 16 cm mark. BIOPATCH applied to the insertion site.    Harlon Ditty, AGACNP-BC [facility] Prefer epic messenger for cross cover needs If after hours, please call E-link

## 2023-07-12 NOTE — ED Provider Notes (Signed)
 [facility] Provider Note    Event Date/Time   First MD Initiated Contact with Patient 07/12/23 [phone]     (approximate)   History   Chief Complaint: Altered Mental Status   HPI  Jeremiah Nguyen is a 68 y.o. male with a history of of alcohol dependence, seizure disorder, COPD who is brought to the ED from his group home due to altered mental status over the last 24 hours, not taking his medications today.  EMS report on their arrival he was 60% on room air.  They felt like he was stridorous and they administered IM epi.  On arrival patient is oriented to self, not place or time.  He is somnolent.          Physical Exam   Triage Vital Signs: ED Triage Vitals  Encounter Vitals Group     BP      Systolic BP Percentile      Diastolic BP Percentile      Pulse      Resp      Temp      Temp src      SpO2      Weight      Height      Head Circumference      Peak Flow      Pain Score      Pain Loc      Pain Education      Exclude from Growth Chart     Most recent vital signs: Vitals:   07/12/23 1145 07/12/23 1200  BP: (!) 84/50 (!) 78/54  Pulse: 70 69  Resp: 20 20  Temp:    SpO2: 100% 99%    General: Somnolent.  Respiratory distress CV:  Good peripheral perfusion.  Regular rate and rhythm Resp:  Tachypnea, accessory muscle use, diffuse expiratory wheezing with prolonged expiratory phase.  No inspiratory stridor.  Symmetric air entry bilaterally Abd:  No distention.  Soft nontender Other:  No lower extremity edema.  Dry oral mucosa.  Symmetric pupils, reactive.  Left arm, left leg demonstrate relative weakness but intact strength compared to right.   ED Results / Procedures / Treatments   Labs (all labs ordered are listed, but only abnormal results are displayed) Labs Reviewed  RESP PANEL BY RT-PCR (RSV, FLU A&B, COVID)  RVPGX2 - Abnormal; Notable for the following components:      Result Value   Influenza A by PCR POSITIVE (*)     All other components within normal limits  LACTIC ACID, PLASMA - Abnormal; Notable for the following components:   Lactic Acid, Venous 4.4 (*)    All other components within normal limits  COMPREHENSIVE METABOLIC PANEL - Abnormal; Notable for the following components:   Glucose, Bld 130 (*)    Creatinine, Ser 2.00 (*)    Calcium 8.6 (*)    AST 116 (*)    GFR, Estimated 36 (*)    All other components within normal limits  CBC WITH DIFFERENTIAL/PLATELET - Abnormal; Notable for the following components:   Neutro Abs 8.3 (*)    Lymphs Abs 0.4 (*)    Abs Immature Granulocytes 0.09 (*)    All other components within normal limits  BLOOD GAS, VENOUS - Abnormal; Notable for the following components:   pH, Ven 7.17 (*)    pCO2, Ven 75 (*)    pO2, Ven 47 (*)    Acid-base deficit 3.0 (*)    All other components within normal  limits  BLOOD GAS, ARTERIAL - Abnormal; Notable for the following components:   pH, Arterial 7.19 (*)    pCO2 arterial 61 (*)    Acid-base deficit 5.8 (*)    All other components within normal limits  CULTURE, BLOOD (ROUTINE X 2)  CULTURE, BLOOD (ROUTINE X 2)  CULTURE, RESPIRATORY W GRAM STAIN  LACTIC ACID, PLASMA  PROTIME-INR  APTT  PROCALCITONIN  URINALYSIS, W/ REFLEX TO CULTURE (INFECTION SUSPECTED)  URINE DRUG SCREEN, QUALITATIVE ([facility] ONLY)  STREP PNEUMONIAE URINARY ANTIGEN  LEGIONELLA PNEUMOPHILA SEROGP 1 UR AG  MYCOPLASMA PNEUMONIAE ANTIBODY, IGM     EKG Interpreted by me Sinus tachycardia rate 102.  Left axis, normal intervals, poor R wave progression.  Normal ST segments and T waves   RADIOLOGY Chest x-ray interpreted by me, appears normal.  Radiology report reviewed   PROCEDURES:  .Critical Care  Performed by: Sharman Cheek, MD Authorized by: Sharman Cheek, MD   Critical care provider statement:    Critical care time (minutes):  90   Critical care time was exclusive of:  Separately billable procedures and treating other patients    Critical care was necessary to treat or prevent imminent or life-threatening deterioration of the following conditions:  Sepsis, respiratory failure and shock   Critical care was time spent personally by me on the following activities:  Development of treatment plan with patient or surrogate, discussions with consultants, evaluation of patient's response to treatment, examination of patient, obtaining history from patient or surrogate, ordering and performing treatments and interventions, ordering and review of laboratory studies, ordering and review of radiographic studies, pulse oximetry, re-evaluation of patient's condition and review of old charts   Care discussed with: admitting provider   Comments:        Procedure Name: Intubation Date/Time: 07/12/2023 1:11 PM  Performed by: Sharman Cheek, MDPre-anesthesia Checklist: Patient identified, Patient being monitored, Emergency Drugs available, Timeout performed and Suction available Oxygen Delivery Method: Non-rebreather mask Preoxygenation: Pre-oxygenation with 100% oxygen Induction Type: Rapid sequence Ventilation: Mask ventilation without difficulty Laryngoscope Size: Glidescope and 3 Grade View: Grade I Tube size: 8.0 mm Number of attempts: 1 Airway Equipment and Method: Video-laryngoscopy Placement Confirmation: ETT inserted through vocal cords under direct vision, CO2 detector and Breath sounds checked- equal and bilateral Secured at: 24 cm Tube secured with: ETT holder Dental Injury: Teeth and Oropharynx as per pre-operative assessment        MEDICATIONS ORDERED IN ED: Medications  0.9 %  sodium chloride infusion (250 mLs Intravenous New Bag/Given 07/12/23 1150)  norepinephrine (LEVOPHED) 4mg  in (0.016 mg/mL) premix infusion (5 mcg/min Intravenous New Bag/Given 07/12/23 1147)  midazolam (VERSED) 100 mg/100 mL (1 mg/mL) premix infusion (1 mg/hr Intravenous New Bag/Given 07/12/23 1307)  fentaNYL in NS  (62mcg/ml) infusion-PREMIX (25 mcg/hr Intravenous New Bag/Given 07/12/23 1304)  sodium chloride 0.9 % bolus 1,000 mL (0 mLs Intravenous Stopped 07/12/23 1124)    And  sodium chloride 0.9 % bolus 1,000 mL (0 mLs Intravenous Stopped 07/12/23 1125)    And  sodium chloride 0.9 % bolus 250 mL (0 mLs Intravenous Stopped 07/12/23 1124)  cefTRIAXone (ROCEPHIN) 2 g in sodium chloride 0.9 % 100 mL IVPB (0 g Intravenous Stopped 07/12/23 0900)  azithromycin (ZITHROMAX) 500 mg in sodium chloride 0.9 % 250 mL IVPB (0 mg Intravenous Stopped 07/12/23 0930)  methylPREDNISolone sodium succinate (SOLU-MEDROL) 125 mg/2 mL injection 125 mg (125 mg Intravenous Given 07/12/23 0930)  ipratropium-albuterol (DUONEB) 0.5-2.5 (3) MG/3ML nebulizer solution 9 mL (9  mLs Nebulization Given 07/12/23 0935)  etomidate (AMIDATE) injection 20 mg (20 mg Intravenous Given 07/12/23 1255)  rocuronium (ZEMURON) injection 80 mg (80 mg Intravenous Given 07/12/23 1256)  sodium chloride 0.9 % bolus 1,000 mL (1,000 mLs Intravenous New Bag/Given 07/12/23 1300)     IMPRESSION / MDM / ASSESSMENT AND PLAN / ED COURSE  I reviewed the triage vital signs and the nursing notes.  DDx: COPD exacerbation, pneumonia, CO2 narcosis, sepsis, anaphylaxis, electrolyte derangement, anemia, UTI  Patient's presentation is most consistent with acute presentation with potential threat to life or bodily function.  Patient presents with respiratory distress, confusion.  Suspect COPD exacerbation with hypercapnia/CO2 narcosis.  Will start BiPAP, Solu-Medrol, DuoNebs, IV fluid resuscitation, antibiotics for CAP coverage.  Due to altered mental status, somewhat lateralizing extremity exam, will obtain CT head.  If he has had a stroke, last known well is unknown and he would not be a candidate for any emergent intervention.   Clinical Course as of 07/12/23 1312  Fri Jul 12, 2023  1028 Bp improved - 105/90. HR 75. On bipap 80% fio2. Will check abg. [PS]  1124 Sepsis  reassessment completed after 66ml/kg IVF bolus finished. Bp now 75/50. With refractory shock, will start levophed. ABG shows persistent hypercapnia and resp acidosis and marginal SO2 on high fio2. A-a gradient =410. Will need to intubate and contact ICU [PS]  1307 Intubation completed uneventfully. Exam prior to RSI shows PERRL, stuporous, decreased pain response on LUE and LLE. [PS]    Clinical Course User Index [PS] Sharman Cheek, MD    ----------------------------------------- 1:12 PM on 07/12/2023 ----------------------------------------- Discussed with ICU team for further management.  Will obtain CT angio chest to evaluate for PE, CT abdomen pelvis due to sepsis without clear source, and CTA head neck due to potential left-sided deficits of unclear duration.   FINAL CLINICAL IMPRESSION(S) / ED DIAGNOSES   Final diagnoses:  Acute respiratory failure with hypoxia and hypercapnia (HCC)  Septic shock (HCC)  Influenza A     Rx / DC Orders   ED Discharge Orders     None        Note:  This document was prepared using Dragon voice recognition software and may include unintentional dictation errors.   Sharman Cheek, MD 07/12/23 [phone]

## 2023-07-13 ENCOUNTER — Inpatient Hospital Stay (HOSPITAL_COMMUNITY)
Admit: 2023-07-13 | Discharge: 2023-07-13 | Disposition: A | Discharge status: Home / Self Care | Payer: 59 | Attending: Pulmonary Disease | Admitting: Pulmonary Disease

## 2023-07-13 DIAGNOSIS — J09X2 Influenza due to identified novel influenza A virus with other respiratory manifestations: Secondary | ICD-10-CM | POA: Diagnosis not present

## 2023-07-13 DIAGNOSIS — J9601 Acute respiratory failure with hypoxia: Secondary | ICD-10-CM | POA: Diagnosis not present

## 2023-07-13 DIAGNOSIS — A419 Sepsis, unspecified organism: Secondary | ICD-10-CM | POA: Diagnosis not present

## 2023-07-13 DIAGNOSIS — R0603 Acute respiratory distress: Secondary | ICD-10-CM | POA: Diagnosis not present

## 2023-07-13 DIAGNOSIS — J9602 Acute respiratory failure with hypercapnia: Secondary | ICD-10-CM | POA: Diagnosis not present

## 2023-07-13 LAB — MAGNESIUM
Magnesium: 2.2 mg/dL (ref 1.7–2.4)
Magnesium: 2.2 mg/dL (ref 1.7–2.4)

## 2023-07-13 LAB — ECHOCARDIOGRAM COMPLETE
AR max vel: 1.78 cm2
AV Area VTI: 1.73 cm2
AV Area mean vel: 1.67 cm2
AV Mean grad: 9 mmHg
AV Peak grad: 15.7 mmHg
Ao pk vel: 1.98 m/s
Area-P 1/2: 3.61 cm2
Calc EF: 51.5 %
Height: 67.992 in
P 1/2 time: 512 ms
S' Lateral: 2.7 cm
Single Plane A2C EF: 52.2 %
Single Plane A4C EF: 50.2 %
Weight: 2409.19 [oz_av]

## 2023-07-13 LAB — HEMOGLOBIN A1C
Hgb A1c MFr Bld: 6 % — ABNORMAL HIGH (ref 4.8–5.6)
Mean Plasma Glucose: 125.5 mg/dL

## 2023-07-13 LAB — CBC
HCT: 39.1 % (ref 39.0–52.0)
Hemoglobin: 13 g/dL (ref 13.0–17.0)
MCH: 30.4 pg (ref 26.0–34.0)
MCHC: 33.2 g/dL (ref 30.0–36.0)
MCV: 91.6 fL (ref 80.0–100.0)
Platelets: 161 10*3/uL (ref 150–400)
RBC: 4.27 MIL/uL (ref 4.22–5.81)
RDW: 14.6 % (ref 11.5–15.5)
WBC: 15.8 10*3/uL — ABNORMAL HIGH (ref 4.0–10.5)
nRBC: 0 % (ref 0.0–0.2)

## 2023-07-13 LAB — BLOOD GAS, ARTERIAL
Acid-Base Excess: 0.5 mmol/L (ref 0.0–2.0)
Bicarbonate: 27 mmol/L (ref 20.0–28.0)
FIO2: 50 %
MECHVT: 570 mL
Mechanical Rate: 14
O2 Saturation: 94.7 %
PEEP: 5 cmH2O
Patient temperature: 37
pCO2 arterial: 50 mmHg — ABNORMAL HIGH (ref 32–48)
pH, Arterial: 7.34 — ABNORMAL LOW (ref 7.35–7.45)
pO2, Arterial: 67 mmHg — ABNORMAL LOW (ref 83–108)

## 2023-07-13 LAB — RENAL FUNCTION PANEL
Albumin: 2.9 g/dL — ABNORMAL LOW (ref 3.5–5.0)
Anion gap: 9 (ref 5–15)
BUN: 22 mg/dL (ref 8–23)
CO2: 25 mmol/L (ref 22–32)
Calcium: 8.3 mg/dL — ABNORMAL LOW (ref 8.9–10.3)
Chloride: 106 mmol/L (ref 98–111)
Creatinine, Ser: 1.27 mg/dL — ABNORMAL HIGH (ref 0.61–1.24)
GFR, Estimated: >60 mL/min (ref >60)
Glucose, Bld: 140 mg/dL — ABNORMAL HIGH (ref 70–99)
Phosphorus: 3 mg/dL (ref 2.5–4.6)
Potassium: 4.1 mmol/L (ref 3.5–5.1)
Sodium: 140 mmol/L (ref 135–145)

## 2023-07-13 LAB — GLUCOSE, CAPILLARY
Glucose-Capillary: 115 mg/dL — ABNORMAL HIGH (ref 70–99)
Glucose-Capillary: 121 mg/dL — ABNORMAL HIGH (ref 70–99)
Glucose-Capillary: 123 mg/dL — ABNORMAL HIGH (ref 70–99)
Glucose-Capillary: 126 mg/dL — ABNORMAL HIGH (ref 70–99)
Glucose-Capillary: 135 mg/dL — ABNORMAL HIGH (ref 70–99)
Glucose-Capillary: 146 mg/dL — ABNORMAL HIGH (ref 70–99)

## 2023-07-13 LAB — STREP PNEUMONIAE URINARY ANTIGEN: Strep Pneumo Urinary Antigen: NEGATIVE

## 2023-07-13 LAB — TROPONIN I (HIGH SENSITIVITY): Troponin I (High Sensitivity): 41 ng/L — ABNORMAL HIGH (ref <18)

## 2023-07-13 LAB — PHOSPHORUS: Phosphorus: 3.2 mg/dL (ref 2.5–4.6)

## 2023-07-13 MED ORDER — FENTANYL CITRATE (PF) 100 MCG/2ML IJ SOLN
100.0000 ug | INTRAMUSCULAR | Status: DC | PRN
  Administered 2023-07-13 – 2023-07-15 (×5): 100 ug via INTRAVENOUS
  Filled 2023-07-13: qty 2

## 2023-07-13 MED ORDER — IPRATROPIUM-ALBUTEROL 0.5-2.5 (3) MG/3ML IN SOLN
3.0000 mL | Freq: Four times a day (QID) | RESPIRATORY_TRACT | Status: DC
  Administered 2023-07-13 – 2023-07-16 (×12): 3 mL via RESPIRATORY_TRACT
  Filled 2023-07-13 (×12): qty 3

## 2023-07-13 MED ORDER — VITAL AF 1.2 CAL PO LIQD
1000.0000 mL | ORAL | Status: DC
  Administered 2023-07-13 – 2023-07-15 (×2): 1000 mL

## 2023-07-13 MED ORDER — FREE WATER
30.0000 mL | Status: DC
  Administered 2023-07-13 – 2023-07-15 (×11): 30 mL

## 2023-07-13 MED ORDER — THIAMINE MONONITRATE 100 MG PO TABS
100.0000 mg | ORAL_TABLET | Freq: Every day | ORAL | Status: DC
  Administered 2023-07-13 – 2023-07-15 (×3): 100 mg
  Filled 2023-07-13 (×3): qty 1

## 2023-07-13 MED ORDER — NOREPINEPHRINE 16 MG/250ML-% IV SOLN
0.0000 ug/min | INTRAVENOUS | Status: DC
  Administered 2023-07-13: 2 ug/min via INTRAVENOUS
  Administered 2023-07-14: 6 ug/min via INTRAVENOUS
  Filled 2023-07-13 (×2): qty 250

## 2023-07-13 MED ORDER — PROSOURCE TF20 ENFIT COMPATIBL EN LIQD
60.0000 mL | Freq: Every day | ENTERAL | Status: DC
  Administered 2023-07-13 – 2023-07-15 (×3): 60 mL
  Filled 2023-07-13 (×4): qty 60

## 2023-07-13 NOTE — Plan of Care (Signed)
  Problem: Respiratory: Goal: Ability to maintain a clear airway and adequate ventilation will improve Outcome: Progressing   Problem: Clinical Measurements: Goal: Respiratory complications will improve Outcome: Progressing Goal: Cardiovascular complication will be avoided Outcome: Progressing   Problem: Pain Managment: Goal: General experience of comfort will improve and/or be controlled Outcome: Progressing   Problem: Safety: Goal: Ability to remain free from injury will improve Outcome: Progressing   Problem: Education: Goal: Knowledge of General Education information will improve Description: Including pain rating scale, medication(s)/side effects and non-pharmacologic comfort measures Outcome: Not Progressing   Problem: Health Behavior/Discharge Planning: Goal: Ability to manage health-related needs will improve Outcome: Not Progressing   Problem: Nutrition: Goal: Adequate nutrition will be maintained Outcome: Not Progressing

## 2023-07-13 NOTE — Progress Notes (Signed)
 Brief Nutrition Note  Consult received for enteral/tube feeding initiation and management.  Initial Assessment completed by Inpatient RD on 2/21. Adult Enteral Nutrition Protocol initiated based on RD recommendations. Electrolyte wdl today, adding thiamine and continued electrolyte monitoring.   Admitting Dx: Influenza A [J10.1] Acute respiratory failure with hypoxia (HCC) [J96.01] Septic shock (HCC) [A41.9, R65.21] Acute respiratory failure with hypoxia and hypercapnia (HCC) [J96.01, J96.02]   Labs:  Recent Labs  Lab 07/12/23 0812 07/13/23 0446  NA 136 140  K 4.5 4.1  CL 99 106  CO2 24 25  BUN 23 22  CREATININE 2.00* 1.27*  CALCIUM 8.6* 8.3*  MG  --  2.2  PHOS  --  3.0  GLUCOSE 130* 140*     Damonie Furney MS, RDN, LDN, CNSC Registered Dietitian 3 Clinical Nutrition RD Inpatient Contact Info in [place]    [place] MS, RDN, LDN, CNSC Registered Dietitian 3 Clinical Nutrition RD Inpatient Contact Info in [place]

## 2023-07-13 NOTE — Plan of Care (Signed)
  Problem: Respiratory: Goal: Ability to maintain a clear airway and adequate ventilation will improve Outcome: Progressing   Problem: Clinical Measurements: Goal: Ability to maintain clinical measurements within normal limits will improve Outcome: Progressing Goal: Will remain free from infection Outcome: Progressing Goal: Diagnostic test results will improve Outcome: Progressing Goal: Respiratory complications will improve Outcome: Progressing Goal: Cardiovascular complication will be avoided Outcome: Progressing

## 2023-07-13 NOTE — Progress Notes (Signed)
  Echocardiogram 2D Echocardiogram has been performed.  Lenor Coffin 07/13/2023, 9:47 AM

## 2023-07-13 NOTE — Progress Notes (Signed)
 NAME:  Jeremiah Nguyen, MRN:  [id number], DOB:  12/18/1955, LOS: 1 ADMISSION DATE:  07/12/2023, CONSULTATION DATE:  07/12/23 REFERRING MD:  Dr. Scotty Court, CHIEF COMPLAINT:  AMS   Brief Pt Description / Synopsis:  68 year old male admitted with acute metabolic encephalopathy, severe sepsis with septic shock, and acute hypoxic and hypercapnic respiratory failure in the setting of acute COPD exacerbation due to influenza A infection and superimposed bacterial pneumonia requiring intubation and mechanical ventilation.  History of Present Illness:  Jeremiah Nguyen is a 68 year old male with a past medical history significant for COPD, alcohol dependence, and seizure disorder who presented to [facility] ED on 07/12/2023 from his group home due to altered mental status.  Patient is currently intubated and sedated and no family is present, therefore history is obtained from chart review.  Per report he has been altered over the last 24 hours, not taking his medications this morning.  Upon EMS arrival he was noted to be hypoxic with O2 sats of 60% on room air, and they noted wheezing and stridor which they gave IM epi.  On arrival to the ED he remained somnolent and altered, oriented only to self.  Given his respiratory distress he was placed on BiPAP.  ED did note that his left arm and left leg demonstrated relative weakness compared to the right.  ED Course: Initial Vital Signs: Temperature 97.6 F axillary, respiratory rate 26, pulse 95, blood pressure 90/58, SpO2 86% on BiPAP Significant Labs: Glucose 130, BUN 23, creatinine 2.0, AST 116, WBC 9.6, procalcitonin 3.5, lactic acid 4.4 VBG: pH 7.17/pCO2 75/pO2 47/bicarb 27.4 VBG post BiPAP: pH 7.19/pCO2 61/pO2 85/bicarb 23.3 Imaging Chest X-ray>>FINDINGS: Cardiopericardial silhouette is at upper limits of normal for size. Diffuse interstitial opacity is similar to prior. Interval improvement in aeration at the right base with persistent left base atelectasis or  infiltrate. No substantial pleural effusion. No acute bony abnormality. Telemetry leads overlie the chest. Medications Administered: 3.25 L of IV fluid boluses, IV azithromycin and ceftriaxone, 125 mg Solu-Medrol  Given his persistent encephalopathy, respiratory failure and respiratory acidosis, he was intubated by ED provider.  PCCM asked to admit for further workup and treatment.  Please see "Significant Hospital Events" section below for full detailed hospital course.   Pertinent  Medical History   Past Medical History:  Diagnosis Date   COPD (chronic obstructive pulmonary disease) (HCC)    Eating disorder    GERD (gastroesophageal reflux disease)    Liver disease    Seizures (HCC)    Sleep apnea     Micro Data:  2/21: COVID/RSV/flu PCR>> + influenza A 2/21: Blood culture x 2>> 2/21: Strep pneumo urinary antigen>> 2/21: Legionella urine antigen>> 2/21: Mycoplasma pneumoniae IgM>> 2/21: MRSA PCR>> 2/21: Tracheal aspirate>>  Antimicrobials:   Anti-infectives (From admission, onward)    Start     Dose/Rate Route Frequency Ordered Stop   07/13/23 1000  azithromycin (ZITHROMAX) 500 mg in sodium chloride 0.9 % 250 mL IVPB        500 mg 250 mL/hr over 60 Minutes Intravenous Every 24 hours 07/12/23 1312 07/17/23 0959   07/13/23 1000  cefTRIAXone (ROCEPHIN) 2 g in sodium chloride 0.9 % 100 mL IVPB        2 g 200 mL/hr over 30 Minutes Intravenous Every 24 hours 07/12/23 1312     07/12/23 1800  oseltamivir (TAMIFLU) capsule 30 mg        30 mg Per Tube 2 times daily 07/12/23 1518 07/17/23 2159  07/12/23 0815  cefTRIAXone (ROCEPHIN) 2 g in sodium chloride 0.9 % 100 mL IVPB        2 g 200 mL/hr over 30 Minutes Intravenous Once 07/12/23 0806 07/12/23 0900   07/12/23 0815  azithromycin (ZITHROMAX) 500 mg in sodium chloride 0.9 % 250 mL IVPB        500 mg 250 mL/hr over 60 Minutes Intravenous  Once 07/12/23 0806 07/12/23 0930        Significant Hospital Events: Including  procedures, antibiotic start and stop dates in addition to other pertinent events   2/21: Presented to ED from group home with altered mental status, hypertension.  Required intubation in the ED by ED provider and initiation of Levophed despite IV fluid resuscitation.  PCCM asked to admit. 2/22 rermains on vent   Interim History / Subjective:  Remains critically ill Remains intubated Severe hypoxia Requires VENT support for survival    Objective   Blood pressure 112/64, pulse 98, temperature 98.4 F (36.9 C), resp. rate 20, height 5' 7.99" (1.727 m), weight 68.3 kg, SpO2 92%.    Vent Mode: PRVC FiO2 (%):  [40 %-65 %] 50 % Set Rate:  [14 bmp-20 bmp] 14 bmp Vt Set:  [500 mL-570 mL] 570 mL PEEP:  [5 cmH20-6 cmH20] 5 cmH20 Plateau Pressure:  [17 cmH20] 17 cmH20   Intake/Output Summary (Last 24 hours) at 07/13/2023 1610 Last data filed at 07/13/2023 0700 Gross per 24 hour  Intake 4692.87 ml  Output 1725 ml  Net 2967.87 ml   Filed Weights   07/12/23 0858 07/12/23 1439 07/13/23 0456  Weight: 68 kg 72.2 kg 68.3 kg     REVIEW OF SYSTEMS  PATIENT IS UNABLE TO PROVIDE COMPLETE REVIEW OF SYSTEMS DUE TO SEVERE CRITICAL ILLNESS   PHYSICAL EXAMINATION:  GENERAL:critically ill appearing, +resp distress EYES: Pupils equal, round, reactive to light.  No scleral icterus.  MOUTH: Moist mucosal membrane. INTUBATED NECK: Supple.  PULMONARY: Lungs clear to auscultation, +rhonchi, +wheezing CARDIOVASCULAR: S1 and S2.  Regular rate and rhythm GASTROINTESTINAL: Soft, nontender, -distended. Positive bowel sounds.  MUSCULOSKELETAL: No swelling, clubbing, or edema.  NEUROLOGIC: sedated SKIN:normal, warm to touch, Capillary refill delayed  Pulses present bilaterally    Assessment & Plan:  68 yo male with  Acute Hypoxic & Hypercapnic Respiratory Failure in the setting of Acute COPD Exacerbation from Influenza A infection and superimposed bacterial pneumonia PMHx: COPD, OSA   Severe ACUTE  Hypoxic and Hypercapnic Respiratory Failure -continue Mechanical Ventilator support -Wean Fio2 and PEEP as tolerated -VAP/VENT bundle implementation - Wean PEEP & FiO2 as tolerated, maintain SpO2 > 88% - Head of bed elevated 30 degrees, VAP protocol in place - Plateau pressures less than 30 cm H20  - Intermittent chest x-ray & ABG PRN - Ensure adequate pulmonary hygiene  Unable to wean today  SEPTIC shock SOURCE-flu, pneumonia -use vasopressors to keep MAP>65 as needed -follow ABG and LA as needed -follow up cultures -emperic ABX -consider stress dose steroids -aggressive IV fluid Resuscitation  INFECTIOUS DISEASE -continue antibiotics as prescribed -follow up cultures Meets SIRS Criteria on presentation (RR 26, HR 95) Severe Sepsis  Influenza A Infection Superimposed Bacterial Pneumonia -Monitor fever curve -Trend WBC's & Procalcitonin -Follow cultures as above -Continue empiric Azithromycin, Ceftriaxone, and Tamiflu pending cultures & sensitivities  ACUTE KIDNEY INJURY/Renal Failure -continue Foley Catheter-assess need -Avoid nephrotoxic agents -Follow urine output, BMP -Ensure adequate renal perfusion, optimize oxygenation -Renal dose medications   Intake/Output Summary (Last 24 hours) at 07/13/2023 0808 Last  data filed at 07/13/2023 0700 Gross per 24 hour  Intake 4692.87 ml  Output 1725 ml  Net 2967.87 ml      Latest Ref Rng & Units 07/13/2023    4:46 AM 07/12/2023    8:12 AM 07/26/2022    1:19 PM  BMP  Glucose 70 - 99 mg/dL 161  096  95   BUN 8 - 23 mg/dL 22  23  10    Creatinine 0.61 - 1.24 mg/dL 0.45  4.09  8.11   Sodium 135 - 145 mmol/L 140  136  139   Potassium 3.5 - 5.1 mmol/L 4.1  4.5  3.6   Chloride 98 - 111 mmol/L 106  99  103   CO2 22 - 32 mmol/L 25  24  28    Calcium 8.9 - 10.3 mg/dL 8.3  8.6  8.6     NEUROLOGY ACUTE METABOLIC ENCEPHALOPATHY -need for sedation -Goal RASS -2 to -3 PMHx: seizures -Treatment of hypercapnia and metabolic  derangements as outlined above -Maintain a RASS goal of 0 to -1 -Fentanyl and Versed to maintain RASS goal -Avoid sedating medications as able -Daily wake up assessment     ENDO - ICU hypoglycemic\Hyperglycemia protocol -check FSBS per protocol   GI GI PROPHYLAXIS as indicated NUTRITIONAL STATUS DIET-->TF's as tolerated Constipation protocol as indicated   ELECTROLYTES -follow labs as needed -replace as needed -pharmacy consultation and following  RESTRICTIVE TRANSFUSION PROTOCOL TRANSFUSION  IF HGB<7  or ACTIVE BLEEDING OR DX of ACUTE CORONARY SYNDROMES       Pt is critically ill with multiorgan failure. Prognosis is guarded, high risk for further decompensation, cardiac arrest, and death.  Given current critical illness superimposed on multiple chronic co-morbidities and advanced age, overall long term prognosis is poor.  Recommend consideration of DNR/DNI status.  Will consult Palliative Care to assist with GOC discussions.    Best Practice (right click and "Reselect all SmartList Selections" daily)   Diet/type: NPO DVT prophylaxis: prophylactic heparin  GI prophylaxis: PPI Lines: N/A Foley:  Yes, and it is still needed Code Status:  full code Last date of multidisciplinary goals of care discussion [N/A]  2/21: Will update family when they arrive at bedside on plan of care.  Labs   CBC: Recent Labs  Lab 07/12/23 0812 07/13/23 0446  WBC 9.6 15.8*  NEUTROABS 8.3*  --   HGB 15.4 13.0  HCT 46.8 39.1  MCV 92.9 91.6  PLT 162 161    Basic Metabolic Panel: Recent Labs  Lab 07/12/23 0812 07/13/23 0446  NA 136 140  K 4.5 4.1  CL 99 106  CO2 24 25  GLUCOSE 130* 140*  BUN 23 22  CREATININE 2.00* 1.27*  CALCIUM 8.6* 8.3*  MG  --  2.2  PHOS  --  3.0   GFR: Estimated Creatinine Clearance: 53.8 mL/min (A) (by C-G formula based on SCr of 1.27 mg/dL (H)). Recent Labs  Lab 07/12/23 0812 07/12/23 1248 07/12/23 1450 07/12/23 1658 07/13/23 0446   PROCALCITON 3.56  --   --   --   --   WBC 9.6  --   --   --  15.8*  LATICACIDVEN 4.4* 1.5 1.9 1.6  --     Liver Function Tests: Recent Labs  Lab 07/12/23 0812 07/13/23 0446  AST 116*  --   ALT 29  --   ALKPHOS 58  --   BILITOT 0.6  --   PROT 7.1  --   ALBUMIN 3.7 2.9*  No results for input(s): "LIPASE", "AMYLASE" in the last 168 hours. No results for input(s): "AMMONIA" in the last 168 hours.  ABG    Component Value Date/Time   PHART 7.34 (L) 07/13/2023 0425   PCO2ART 50 (H) 07/13/2023 0425   PO2ART 67 (L) 07/13/2023 0425   HCO3 27.0 07/13/2023 0425   ACIDBASEDEF 3.8 (H) 07/12/2023 1600   O2SAT 94.7 07/13/2023 0425     Coagulation Profile: Recent Labs  Lab 07/12/23 0812  INR 1.1    Cardiac Enzymes: No results for input(s): "CKTOTAL", "CKMB", "CKMBINDEX", "TROPONINI" in the last 168 hours.  HbA1C: Hgb A1c MFr Bld  Date/Time Value Ref Range Status  01/03/2018 10:17 AM 5.2 <5.7 % of total Hgb Final    Comment:    For the purpose of screening for the presence of diabetes: . <5.7%       Consistent with the absence of diabetes 5.7-6.4%    Consistent with increased risk for diabetes             (prediabetes) > or =6.5%  Consistent with diabetes . This assay result is consistent with a decreased risk of diabetes. . Currently, no consensus exists regarding use of hemoglobin A1c for diagnosis of diabetes in children. . According to American Diabetes Association (ADA) guidelines, hemoglobin A1c <7.0% represents optimal control in non-pregnant diabetic patients. Different metrics may apply to specific patient populations.  Standards of Medical Care in Diabetes(ADA). .     CBG: Recent Labs  Lab 07/12/23 1708 07/12/23 1932 07/12/23 2335 07/13/23 0341 07/13/23 0730  GLUCAP 164* 163* 138* 146* 121*     Critical Care Time devoted to patient care services described in this note is 55 minutes.  Critical care was necessary to treat /prevent imminent  and life-threatening deterioration. Overall, patient is critically ill, prognosis is guarded.  Patient with Multiorgan failure and at high risk for cardiac arrest and death.    Lucie Leather, M.D.  [facility]  Medical Director [facility] Medical Director [facility] Cardio-Pulmonary Department

## 2023-07-14 DIAGNOSIS — J9602 Acute respiratory failure with hypercapnia: Secondary | ICD-10-CM | POA: Diagnosis not present

## 2023-07-14 DIAGNOSIS — J9601 Acute respiratory failure with hypoxia: Secondary | ICD-10-CM | POA: Diagnosis not present

## 2023-07-14 DIAGNOSIS — J09X2 Influenza due to identified novel influenza A virus with other respiratory manifestations: Secondary | ICD-10-CM | POA: Diagnosis not present

## 2023-07-14 DIAGNOSIS — A419 Sepsis, unspecified organism: Secondary | ICD-10-CM | POA: Diagnosis not present

## 2023-07-14 LAB — RENAL FUNCTION PANEL
Albumin: 3 g/dL — ABNORMAL LOW (ref 3.5–5.0)
Anion gap: 7 (ref 5–15)
BUN: 29 mg/dL — ABNORMAL HIGH (ref 8–23)
CO2: 26 mmol/L (ref 22–32)
Calcium: 8.8 mg/dL — ABNORMAL LOW (ref 8.9–10.3)
Chloride: 107 mmol/L (ref 98–111)
Creatinine, Ser: 1.17 mg/dL (ref 0.61–1.24)
GFR, Estimated: >60 mL/min (ref >60)
Glucose, Bld: 151 mg/dL — ABNORMAL HIGH (ref 70–99)
Phosphorus: 3.2 mg/dL (ref 2.5–4.6)
Potassium: 4.5 mmol/L (ref 3.5–5.1)
Sodium: 140 mmol/L (ref 135–145)

## 2023-07-14 LAB — CBC
HCT: 41.4 % (ref 39.0–52.0)
Hemoglobin: 13.3 g/dL (ref 13.0–17.0)
MCH: 30.2 pg (ref 26.0–34.0)
MCHC: 32.1 g/dL (ref 30.0–36.0)
MCV: 93.9 fL (ref 80.0–100.0)
Platelets: 175 10*3/uL (ref 150–400)
RBC: 4.41 MIL/uL (ref 4.22–5.81)
RDW: 14.7 % (ref 11.5–15.5)
WBC: 14.2 10*3/uL — ABNORMAL HIGH (ref 4.0–10.5)
nRBC: 0.1 % (ref 0.0–0.2)

## 2023-07-14 LAB — MAGNESIUM
Magnesium: 2.7 mg/dL — ABNORMAL HIGH (ref 1.7–2.4)
Magnesium: 1.5 mg/dL — ABNORMAL LOW (ref 1.7–2.4)

## 2023-07-14 LAB — GLUCOSE, CAPILLARY
Glucose-Capillary: 171 mg/dL — ABNORMAL HIGH (ref 70–99)
Glucose-Capillary: 137 mg/dL — ABNORMAL HIGH (ref 70–99)
Glucose-Capillary: 123 mg/dL — ABNORMAL HIGH (ref 70–99)
Glucose-Capillary: 153 mg/dL — ABNORMAL HIGH (ref 70–99)
Glucose-Capillary: 149 mg/dL — ABNORMAL HIGH (ref 70–99)

## 2023-07-14 LAB — PHOSPHORUS
Phosphorus: 3.2 mg/dL (ref 2.5–4.6)
Phosphorus: 1.7 mg/dL — ABNORMAL LOW (ref 2.5–4.6)

## 2023-07-14 MED ORDER — POLYETHYLENE GLYCOL 3350 17 G PO PACK
17.0000 g | PACK | Freq: Every day | ORAL | Status: DC
  Administered 2023-07-14 – 2023-07-15 (×2): 17 g
  Filled 2023-07-14 (×2): qty 1

## 2023-07-14 MED ORDER — MAGNESIUM SULFATE 2 GM/50ML IV SOLN
2.0000 g | Freq: Once | INTRAVENOUS | Status: AC
  Administered 2023-07-14: 2 g via INTRAVENOUS
  Filled 2023-07-14: qty 50

## 2023-07-14 MED ORDER — ENOXAPARIN SODIUM 40 MG/0.4ML IJ SOSY
40.0000 mg | PREFILLED_SYRINGE | Freq: Every day | INTRAMUSCULAR | Status: DC
  Administered 2023-07-14 – 2023-07-31 (×18): 40 mg via SUBCUTANEOUS
  Filled 2023-07-14 (×18): qty 0.4

## 2023-07-14 MED ORDER — SODIUM PHOSPHATES 45 MMOLE/15ML IV SOLN
30.0000 mmol | Freq: Once | INTRAVENOUS | Status: AC
  Administered 2023-07-14: 30 mmol via INTRAVENOUS
  Filled 2023-07-14: qty 10

## 2023-07-14 MED ORDER — SENNOSIDES-DOCUSATE SODIUM 8.6-50 MG PO TABS
1.0000 | ORAL_TABLET | Freq: Two times a day (BID) | ORAL | Status: DC
  Administered 2023-07-14 – 2023-07-15 (×3): 1
  Filled 2023-07-14 (×3): qty 1

## 2023-07-14 NOTE — Plan of Care (Signed)
  Problem: Clinical Measurements: Goal: Ability to maintain clinical measurements within normal limits will improve Outcome: Progressing Goal: Will remain free from infection Outcome: Progressing Goal: Diagnostic test results will improve Outcome: Progressing Goal: Respiratory complications will improve Outcome: Progressing   Problem: Nutrition: Goal: Adequate nutrition will be maintained Outcome: Progressing   Problem: Skin Integrity: Goal: Risk for impaired skin integrity will decrease Outcome: Progressing

## 2023-07-14 NOTE — Plan of Care (Signed)
   Problem: Nutrition: Goal: Adequate nutrition will be maintained Outcome: Progressing   Problem: Elimination: Goal: Will not experience complications related to bowel motility Outcome: Progressing

## 2023-07-14 NOTE — Progress Notes (Signed)
 NAME:  Jeremiah Nguyen, MRN:  [id number], DOB:  1955-09-06, LOS: 2 ADMISSION DATE:  07/12/2023, CONSULTATION DATE:  07/12/23 REFERRING MD:  Dr. Scotty Court, CHIEF COMPLAINT:  AMS   Brief Pt Description / Synopsis:  68 year old male admitted with acute metabolic encephalopathy, severe sepsis with septic shock, and acute hypoxic and hypercapnic respiratory failure in the setting of acute COPD exacerbation due to influenza A infection and superimposed bacterial pneumonia requiring intubation and mechanical ventilation.  History of Present Illness:  Jeremiah Nguyen is a 68 year old male with a past medical history significant for COPD, alcohol dependence, and seizure disorder who presented to [facility] ED on 07/12/2023 from his group home due to altered mental status.  Patient is currently intubated and sedated and no family is present, therefore history is obtained from chart review.  Per report he has been altered over the last 24 hours, not taking his medications this morning.  Upon EMS arrival he was noted to be hypoxic with O2 sats of 60% on room air, and they noted wheezing and stridor which they gave IM epi.  On arrival to the ED he remained somnolent and altered, oriented only to self.  Given his respiratory distress he was placed on BiPAP.  ED did note that his left arm and left leg demonstrated relative weakness compared to the right. PCCM asked to admit for further workup and treatment.  Please see "Significant Hospital Events" section below for full detailed hospital course.   Pertinent  Medical History   Past Medical History:  Diagnosis Date   COPD (chronic obstructive pulmonary disease) (HCC)    Eating disorder    GERD (gastroesophageal reflux disease)    Liver disease    Seizures (HCC)    Sleep apnea     Micro Data:  2/21: COVID/RSV/flu PCR>> + influenza A 2/21: Blood culture x 2>> 2/21: Strep pneumo urinary antigen>> 2/21: Legionella urine antigen>> 2/21: Mycoplasma pneumoniae  IgM>> 2/21: MRSA PCR>> 2/21: Tracheal aspirate>>  Antimicrobials:   Anti-infectives (From admission, onward)    Start     Dose/Rate Route Frequency Ordered Stop   07/13/23 1000  azithromycin (ZITHROMAX) 500 mg in sodium chloride 0.9 % 250 mL IVPB        500 mg 250 mL/hr over 60 Minutes Intravenous Every 24 hours 07/12/23 1312 07/17/23 0959   07/13/23 1000  cefTRIAXone (ROCEPHIN) 2 g in sodium chloride 0.9 % 100 mL IVPB        2 g 200 mL/hr over 30 Minutes Intravenous Every 24 hours 07/12/23 1312     07/12/23 1800  oseltamivir (TAMIFLU) capsule 30 mg        30 mg Per Tube 2 times daily 07/12/23 1518 07/17/23 2159   07/12/23 0815  cefTRIAXone (ROCEPHIN) 2 g in sodium chloride 0.9 % 100 mL IVPB        2 g 200 mL/hr over 30 Minutes Intravenous Once 07/12/23 0806 07/12/23 0900   07/12/23 0815  azithromycin (ZITHROMAX) 500 mg in sodium chloride 0.9 % 250 mL IVPB        500 mg 250 mL/hr over 60 Minutes Intravenous  Once 07/12/23 0806 07/12/23 0930        Significant Hospital Events: Including procedures, antibiotic start and stop dates in addition to other pertinent events   2/21: Presented to ED from group home with altered mental status, hypertension.  Required intubation in the ED by ED provider and initiation of Levophed despite IV fluid resuscitation.  PCCM asked to admit. 2/22  rermains on vent 2/23    Interim History / Subjective:  Remains critically ill Remains intubated Severe hypoxia Requires VENT support for survival    Objective   Blood pressure (!) 109/56, pulse 85, temperature 98.1 F (36.7 C), temperature source Axillary, resp. rate 14, height 5' 7.99" (1.727 m), weight 69.5 kg, SpO2 92%.    Vent Mode: PRVC FiO2 (%):  [50 %-65 %] 65 % Set Rate:  [14 bmp] 14 bmp Vt Set:  [570 mL] 570 mL PEEP:  [5 cmH20] 5 cmH20 Plateau Pressure:  [17 cmH20] 17 cmH20   Intake/Output Summary (Last 24 hours) at 07/14/2023 0754 Last data filed at 07/14/2023 1191 Gross per 24  hour  Intake 1487.4 ml  Output 1479 ml  Net 8.4 ml   Filed Weights   07/12/23 1439 07/13/23 0456 07/14/23 0438  Weight: 72.2 kg 68.3 kg 69.5 kg      REVIEW OF SYSTEMS  PATIENT IS UNABLE TO PROVIDE COMPLETE REVIEW OF SYSTEMS DUE TO SEVERE CRITICAL ILLNESS   PHYSICAL EXAMINATION:  GENERAL:critically ill appearing, +resp distress EYES: Pupils equal, round, reactive to light.  No scleral icterus.  MOUTH: Moist mucosal membrane. INTUBATED NECK: Supple.  PULMONARY: Lungs clear to auscultation, +rhonchi, +wheezing CARDIOVASCULAR: S1 and S2.  Regular rate and rhythm GASTROINTESTINAL: Soft, nontender, -distended. Positive bowel sounds.  MUSCULOSKELETAL: No swelling, clubbing, or edema.  NEUROLOGIC: obtunded,sedated SKIN:normal, warm to touch, Capillary refill delayed  Pulses present bilaterally    Assessment & Plan:  68 yo male with  Acute Hypoxic & Hypercapnic Respiratory Failure in the setting of Acute COPD Exacerbation from Influenza A infection and superimposed bacterial pneumonia PMHx: COPD, OSA  Severe ACUTE Hypoxic and Hypercapnic Respiratory Failure -continue Mechanical Ventilator support -Wean Fio2 and PEEP as tolerated -VAP/VENT bundle implementation - Wean PEEP & FiO2 as tolerated, maintain SpO2 > 88% - Head of bed elevated 30 degrees, VAP protocol in place - Plateau pressures less than 30 cm H20  - Intermittent chest x-ray & ABG PRN - Ensure adequate pulmonary hygiene  Unable to wean today  SEPTIC shock SOURCE-flu, pneumonia -use vasopressors to keep MAP>65 as needed -follow ABG and LA as needed -follow up cultures -emperic ABX -consider stress dose steroids -aggressive IV fluid Resuscitation  INFECTIOUS DISEASE -continue antibiotics as prescribed -follow up cultures Meets SIRS Criteria on presentation (RR 26, HR 95) Severe Sepsis  Influenza A Infection Superimposed Bacterial Pneumonia -Monitor fever curve -Trend WBC's & Procalcitonin -Follow  cultures as above -Continue empiric Azithromycin, Ceftriaxone, and Tamiflu pending cultures & sensitivities   ACUTE KIDNEY INJURY/Renal Failure -continue Foley Catheter-assess need -Avoid nephrotoxic agents -Follow urine output, BMP -Ensure adequate renal perfusion, optimize oxygenation -Renal dose medications   Intake/Output Summary (Last 24 hours) at 07/14/2023 0757 Last data filed at 07/14/2023 4782 Gross per 24 hour  Intake 1487.4 ml  Output 1479 ml  Net 8.4 ml        Latest Ref Rng & Units 07/14/2023    4:28 AM 07/13/2023    4:46 AM 07/12/2023    8:12 AM  BMP  Glucose 70 - 99 mg/dL 956  213  086   BUN 8 - 23 mg/dL 29  22  23    Creatinine 0.61 - 1.24 mg/dL 5.78  4.69  6.29   Sodium 135 - 145 mmol/L 140  140  136   Potassium 3.5 - 5.1 mmol/L 4.5  4.1  4.5   Chloride 98 - 111 mmol/L 107  106  99   CO2 22 -  32 mmol/L 26  25  24    Calcium 8.9 - 10.3 mg/dL 8.8  8.3  8.6     NEUROLOGY ACUTE METABOLIC ENCEPHALOPATHY -need for sedation -Goal RASS -2 to -3 PMHx: seizures -Treatment of hypercapnia and metabolic derangements as outlined above -Maintain a RASS goal of 0 to -1 -Fentanyl and Versed to maintain RASS goal -Avoid sedating medications as able -Daily wake up assessment    ENDO - ICU hypoglycemic\Hyperglycemia protocol -check FSBS per protocol   GI GI PROPHYLAXIS as indicated NUTRITIONAL STATUS DIET-->TF's as tolerated Constipation protocol as indicated   ELECTROLYTES -follow labs as needed -replace as needed -pharmacy consultation and following  RESTRICTIVE TRANSFUSION PROTOCOL TRANSFUSION  IF HGB<7  or ACTIVE BLEEDING OR DX of ACUTE CORONARY SYNDROMES   Pt is critically ill with multiorgan failure. Prognosis is guarded, high risk for further decompensation, cardiac arrest, and death.  Given current critical illness superimposed on multiple chronic co-morbidities and advanced age, overall long term prognosis is poor.  Recommend consideration of  DNR/DNI status.  Will consult Palliative Care to assist with GOC discussions.    Best Practice (right click and "Reselect all SmartList Selections" daily)   Diet/type: NPO DVT prophylaxis: prophylactic heparin  GI prophylaxis: PPI Lines: N/A Foley:  Yes, and it is still needed Code Status:  full code Last date of multidisciplinary goals of care discussion [N/A]  2/21: Will update family when they arrive at bedside on plan of care.  Labs   CBC: Recent Labs  Lab 07/12/23 0812 07/13/23 0446 07/14/23 0428  WBC 9.6 15.8* 14.2*  NEUTROABS 8.3*  --   --   HGB 15.4 13.0 13.3  HCT 46.8 39.1 41.4  MCV 92.9 91.6 93.9  PLT 162 161 175    Basic Metabolic Panel: Recent Labs  Lab 07/12/23 0812 07/13/23 0446 07/13/23 1848 07/14/23 0428  NA 136 140  --  140  K 4.5 4.1  --  4.5  CL 99 106  --  107  CO2 24 25  --  26  GLUCOSE 130* 140*  --  151*  BUN 23 22  --  29*  CREATININE 2.00* 1.27*  --  1.17  CALCIUM 8.6* 8.3*  --  8.8*  MG  --  2.2 2.2 2.7*  PHOS  --  3.0 3.2 3.2  3.2   GFR: Estimated Creatinine Clearance: 58.5 mL/min (by C-G formula based on SCr of 1.17 mg/dL). Recent Labs  Lab 07/12/23 0812 07/12/23 1248 07/12/23 1450 07/12/23 1658 07/13/23 0446 07/14/23 0428  PROCALCITON 3.56  --   --   --   --   --   WBC 9.6  --   --   --  15.8* 14.2*  LATICACIDVEN 4.4* 1.5 1.9 1.6  --   --     Liver Function Tests: Recent Labs  Lab 07/12/23 0812 07/13/23 0446 07/14/23 0428  AST 116*  --   --   ALT 29  --   --   ALKPHOS 58  --   --   BILITOT 0.6  --   --   PROT 7.1  --   --   ALBUMIN 3.7 2.9* 3.0*   No results for input(s): "LIPASE", "AMYLASE" in the last 168 hours. No results for input(s): "AMMONIA" in the last 168 hours.  ABG    Component Value Date/Time   PHART 7.34 (L) 07/13/2023 0425   PCO2ART 50 (H) 07/13/2023 0425   PO2ART 67 (L) 07/13/2023 0425   HCO3 27.0 07/13/2023 0425  ACIDBASEDEF 3.8 (H) 07/12/2023 1600   O2SAT 94.7 07/13/2023 0425      Coagulation Profile: Recent Labs  Lab 07/12/23 0812  INR 1.1    Cardiac Enzymes: No results for input(s): "CKTOTAL", "CKMB", "CKMBINDEX", "TROPONINI" in the last 168 hours.  HbA1C: Hgb A1c MFr Bld  Date/Time Value Ref Range Status  07/13/2023 04:46 AM 6.0 (H) 4.8 - 5.6 % Final    Comment:    (NOTE) Pre diabetes:          5.7%-6.4%  Diabetes:              >6.4%  Glycemic control for   <7.0% adults with diabetes   01/03/2018 10:17 AM 5.2 <5.7 % of total Hgb Final    Comment:    For the purpose of screening for the presence of diabetes: . <5.7%       Consistent with the absence of diabetes 5.7-6.4%    Consistent with increased risk for diabetes             (prediabetes) > or =6.5%  Consistent with diabetes . This assay result is consistent with a decreased risk of diabetes. . Currently, no consensus exists regarding use of hemoglobin A1c for diagnosis of diabetes in children. . According to American Diabetes Association (ADA) guidelines, hemoglobin A1c <7.0% represents optimal control in non-pregnant diabetic patients. Different metrics may apply to specific patient populations.  Standards of Medical Care in Diabetes(ADA). .     CBG: Recent Labs  Lab 07/13/23 1556 07/13/23 1949 07/13/23 2356 07/14/23 0403 07/14/23 0739  GLUCAP 126* 123* 135* 171* 137*      DVT/GI PRX  assessed I Assessed the need for Labs I Assessed the need for Foley I Assessed the need for Central Venous Line Family Discussion when available I Assessed the need for Mobilization I made an Assessment of medications to be adjusted accordingly Safety Risk assessment completed  CASE DISCUSSED IN MULTIDISCIPLINARY ROUNDS WITH ICU TEAM     Critical Care Time devoted to patient care services described in this note is 50 minutes.  Critical care was necessary to treat /prevent imminent and life-threatening deterioration. Overall, patient is critically ill, prognosis is guarded.   Patient with Multiorgan failure and at high risk for cardiac arrest and death.    Lucie Leather, M.D.  [facility]  Medical Director [facility] Medical Director [facility] Cardio-Pulmonary Department

## 2023-07-15 DIAGNOSIS — E440 Moderate protein-calorie malnutrition: Secondary | ICD-10-CM

## 2023-07-15 LAB — RENAL FUNCTION PANEL
Albumin: 2.9 g/dL — ABNORMAL LOW (ref 3.5–5.0)
Anion gap: 7 (ref 5–15)
BUN: 33 mg/dL — ABNORMAL HIGH (ref 8–23)
CO2: 31 mmol/L (ref 22–32)
Calcium: 8.9 mg/dL (ref 8.9–10.3)
Chloride: 110 mmol/L (ref 98–111)
Creatinine, Ser: 1.09 mg/dL (ref 0.61–1.24)
GFR, Estimated: >60 mL/min (ref >60)
Glucose, Bld: 161 mg/dL — ABNORMAL HIGH (ref 70–99)
Phosphorus: 3.9 mg/dL (ref 2.5–4.6)
Potassium: 4.8 mmol/L (ref 3.5–5.1)
Sodium: 148 mmol/L — ABNORMAL HIGH (ref 135–145)

## 2023-07-15 LAB — LEGIONELLA PNEUMOPHILA SEROGP 1 UR AG: L. pneumophila Serogp 1 Ur Ag: NEGATIVE

## 2023-07-15 LAB — GLUCOSE, CAPILLARY
Glucose-Capillary: 117 mg/dL — ABNORMAL HIGH (ref 70–99)
Glucose-Capillary: 127 mg/dL — ABNORMAL HIGH (ref 70–99)
Glucose-Capillary: 151 mg/dL — ABNORMAL HIGH (ref 70–99)
Glucose-Capillary: 162 mg/dL — ABNORMAL HIGH (ref 70–99)
Glucose-Capillary: 73 mg/dL (ref 70–99)
Glucose-Capillary: 97 mg/dL (ref 70–99)
Glucose-Capillary: 98 mg/dL (ref 70–99)

## 2023-07-15 LAB — CBC
HCT: 41.3 % (ref 39.0–52.0)
Hemoglobin: 13.3 g/dL (ref 13.0–17.0)
MCH: 30.3 pg (ref 26.0–34.0)
MCHC: 32.2 g/dL (ref 30.0–36.0)
MCV: 94.1 fL (ref 80.0–100.0)
Platelets: 173 10*3/uL (ref 150–400)
RBC: 4.39 MIL/uL (ref 4.22–5.81)
RDW: 15.2 % (ref 11.5–15.5)
WBC: 12 10*3/uL — ABNORMAL HIGH (ref 4.0–10.5)
nRBC: 0.7 % — ABNORMAL HIGH (ref 0.0–0.2)

## 2023-07-15 LAB — PHOSPHORUS: Phosphorus: 3.8 mg/dL (ref 2.5–4.6)

## 2023-07-15 LAB — MAGNESIUM: Magnesium: 2.9 mg/dL — ABNORMAL HIGH (ref 1.7–2.4)

## 2023-07-15 MED ORDER — NICOTINE 14 MG/24HR TD PT24
14.0000 mg | MEDICATED_PATCH | Freq: Every day | TRANSDERMAL | Status: DC
  Administered 2023-07-15 – 2023-08-12 (×28): 14 mg via TRANSDERMAL
  Filled 2023-07-15 (×29): qty 1

## 2023-07-15 MED ORDER — LAMOTRIGINE 100 MG PO TABS
150.0000 mg | ORAL_TABLET | Freq: Two times a day (BID) | ORAL | Status: DC
  Administered 2023-07-15 – 2023-08-12 (×56): 150 mg via ORAL
  Filled 2023-07-15 (×5): qty 2
  Filled 2023-07-15 (×2): qty 6
  Filled 2023-07-15 (×6): qty 2
  Filled 2023-07-15: qty 6
  Filled 2023-07-15 (×8): qty 2
  Filled 2023-07-15: qty 6
  Filled 2023-07-15 (×8): qty 2
  Filled 2023-07-15: qty 6
  Filled 2023-07-15 (×5): qty 2
  Filled 2023-07-15: qty 6
  Filled 2023-07-15 (×10): qty 2
  Filled 2023-07-15: qty 6
  Filled 2023-07-15 (×4): qty 2
  Filled 2023-07-15: qty 6
  Filled 2023-07-15: qty 2
  Filled 2023-07-15: qty 6

## 2023-07-15 MED ORDER — SENNOSIDES-DOCUSATE SODIUM 8.6-50 MG PO TABS
1.0000 | ORAL_TABLET | Freq: Two times a day (BID) | ORAL | Status: DC
  Administered 2023-07-15 – 2023-07-16 (×3): 1 via ORAL
  Filled 2023-07-15 (×3): qty 1

## 2023-07-15 MED ORDER — THIAMINE MONONITRATE 100 MG PO TABS
100.0000 mg | ORAL_TABLET | Freq: Every day | ORAL | Status: DC
  Administered 2023-07-16 – 2023-08-12 (×28): 100 mg via ORAL
  Filled 2023-07-15 (×28): qty 1

## 2023-07-15 MED ORDER — OSELTAMIVIR PHOSPHATE 75 MG PO CAPS
75.0000 mg | ORAL_CAPSULE | Freq: Two times a day (BID) | ORAL | Status: AC
  Administered 2023-07-15 – 2023-07-17 (×4): 75 mg via ORAL
  Filled 2023-07-15 (×4): qty 1

## 2023-07-15 MED ORDER — OSELTAMIVIR PHOSPHATE 75 MG PO CAPS
75.0000 mg | ORAL_CAPSULE | Freq: Two times a day (BID) | ORAL | Status: DC
  Administered 2023-07-15: 75 mg
  Filled 2023-07-15 (×2): qty 1

## 2023-07-15 MED ORDER — ORAL CARE MOUTH RINSE: 15.0000 mL | OROMUCOSAL | Status: DC | PRN

## 2023-07-15 NOTE — Plan of Care (Signed)
  Problem: Respiratory: Goal: Ability to maintain a clear airway and adequate ventilation will improve Outcome: Progressing   Problem: Nutrition: Goal: Adequate nutrition will be maintained Outcome: Progressing   Problem: Pain Managment: Goal: General experience of comfort will improve and/or be controlled Outcome: Progressing

## 2023-07-15 NOTE — Progress Notes (Signed)
 NAME:  Jeremiah Nguyen, MRN:  [id number], DOB:  01-Nov-1955, LOS: 3 ADMISSION DATE:  07/12/2023, CONSULTATION DATE:  07/12/23 REFERRING MD:  Dr. Scotty Court, CHIEF COMPLAINT:  AMS   Brief Pt Description / Synopsis:  68 year old male admitted with acute metabolic encephalopathy, severe sepsis with septic shock, and acute hypoxic and hypercapnic respiratory failure in the setting of acute COPD exacerbation due to influenza A infection and superimposed bacterial pneumonia requiring intubation and mechanical ventilation.  History of Present Illness:  Jeremiah Nguyen is a 68 year old male with a past medical history significant for COPD, alcohol dependence, and seizure disorder who presented to [facility] ED on 07/12/2023 from his group home due to altered mental status.  Patient is currently intubated and sedated and no family is present, therefore history is obtained from chart review.  Per report he has been altered over the last 24 hours, not taking his medications this morning.  Upon EMS arrival he was noted to be hypoxic with O2 sats of 60% on room air, and they noted wheezing and stridor which they gave IM epi.  On arrival to the ED he remained somnolent and altered, oriented only to self.  Given his respiratory distress he was placed on BiPAP.  ED did note that his left arm and left leg demonstrated relative weakness compared to the right. PCCM asked to admit for further workup and treatment.  Please see "Significant Hospital Events" section below for full detailed hospital course.  07/15/23- patient passed SBT today.  He is liberated from MV on to [facility]. Remains with encephalopathy.    Pertinent  Medical History   Past Medical History:  Diagnosis Date   COPD (chronic obstructive pulmonary disease) (HCC)    Eating disorder    GERD (gastroesophageal reflux disease)    Liver disease    Seizures (HCC)    Sleep apnea     Micro Data:  2/21: COVID/RSV/flu PCR>> + influenza A 2/21: Blood culture x  2>> 2/21: Strep pneumo urinary antigen>> 2/21: Legionella urine antigen>> 2/21: Mycoplasma pneumoniae IgM>> 2/21: MRSA PCR>> 2/21: Tracheal aspirate>>  Antimicrobials:   Anti-infectives (From admission, onward)    Start     Dose/Rate Route Frequency Ordered Stop   07/13/23 1000  azithromycin (ZITHROMAX) 500 mg in sodium chloride 0.9 % 250 mL IVPB        500 mg 250 mL/hr over 60 Minutes Intravenous Every 24 hours 07/12/23 1312 07/17/23 0959   07/13/23 1000  cefTRIAXone (ROCEPHIN) 2 g in sodium chloride 0.9 % 100 mL IVPB        2 g 200 mL/hr over 30 Minutes Intravenous Every 24 hours 07/12/23 1312     07/12/23 1800  oseltamivir (TAMIFLU) capsule 30 mg        30 mg Per Tube 2 times daily 07/12/23 1518 07/17/23 2159   07/12/23 0815  cefTRIAXone (ROCEPHIN) 2 g in sodium chloride 0.9 % 100 mL IVPB        2 g 200 mL/hr over 30 Minutes Intravenous Once 07/12/23 0806 07/12/23 0900   07/12/23 0815  azithromycin (ZITHROMAX) 500 mg in sodium chloride 0.9 % 250 mL IVPB        500 mg 250 mL/hr over 60 Minutes Intravenous  Once 07/12/23 0806 07/12/23 0930        Significant Hospital Events: Including procedures, antibiotic start and stop dates in addition to other pertinent events   2/21: Presented to ED from group home with altered mental status, hypertension.  Required intubation in  the ED by ED provider and initiation of Levophed despite IV fluid resuscitation.  PCCM asked to admit. 2/22 rermains on vent 2/23    Interim History / Subjective:  Remains critically ill Remains intubated Severe hypoxia Requires VENT support for survival    Objective   Blood pressure (!) 113/54, pulse 98, temperature 99.3 F (37.4 C), resp. rate 14, height 5' 7.99" (1.727 m), weight 68.6 kg, SpO2 97%.    Vent Mode: PRVC FiO2 (%):  [60 %-65 %] 60 % Set Rate:  [14 bmp] 14 bmp Vt Set:  [570 mL] 570 mL PEEP:  [5 cmH20] 5 cmH20 Plateau Pressure:  [14 cmH20-15 cmH20] 15 cmH20   Intake/Output Summary  (Last 24 hours) at 07/15/2023 1610 Last data filed at 07/15/2023 0751 Gross per 24 hour  Intake 2079.26 ml  Output 1960 ml  Net 119.26 ml   Filed Weights   07/13/23 0456 07/14/23 0438 07/15/23 0500  Weight: 68.3 kg 69.5 kg 68.6 kg      REVIEW OF SYSTEMS  PATIENT IS UNABLE TO PROVIDE COMPLETE REVIEW OF SYSTEMS DUE TO SEVERE CRITICAL ILLNESS   PHYSICAL EXAMINATION:  GENERAL:critically ill appearing, +resp distress EYES: Pupils equal, round, reactive to light.  No scleral icterus.  MOUTH: Moist mucosal membrane. INTUBATED NECK: Supple.  PULMONARY: Lungs clear to auscultation, +rhonchi, +wheezing CARDIOVASCULAR: S1 and S2.  Regular rate and rhythm GASTROINTESTINAL: Soft, nontender, -distended. Positive bowel sounds.  MUSCULOSKELETAL: No swelling, clubbing, or edema.  NEUROLOGIC: obtunded,sedated SKIN:normal, warm to touch, Capillary refill delayed  Pulses present bilaterally    Assessment & Plan:  68 yo male with  Acute Hypoxic & Hypercapnic Respiratory Failure in the setting of Acute COPD Exacerbation from Influenza A infection and superimposed bacterial pneumonia PMHx: COPD, OSA  Severe ACUTE Hypoxic and Hypercapnic Respiratory Failure -continue Mechanical Ventilator support -Wean Fio2 and PEEP as tolerated -VAP/VENT bundle implementation - Wean PEEP & FiO2 as tolerated, maintain SpO2 > 88% - Head of bed elevated 30 degrees, VAP protocol in place - Plateau pressures less than 30 cm H20  - Intermittent chest x-ray & ABG PRN - Ensure adequate pulmonary hygiene  Unable to wean today  SEPTIC shock-PRESENT ON ADMISSION - NOW RESOLVED SOURCE-flu, pneumonia -use vasopressors to keep MAP>65 as needed -follow ABG and LA as needed -follow up cultures -emperic ABX -consider stress dose steroids -aggressive IV fluid Resuscitation  INFECTIOUS DISEASE -continue antibiotics as prescribed -follow up cultures Meets SIRS Criteria on presentation (RR 26, HR 95) Severe Sepsis   Influenza A Infection Superimposed Bacterial Pneumonia -Monitor fever curve -Trend WBC's & Procalcitonin -Follow cultures as above -Continue empiric Azithromycin, Ceftriaxone, and Tamiflu pending cultures & sensitivities   ACUTE KIDNEY INJURY/Renal Failure -continue Foley Catheter-assess need -Avoid nephrotoxic agents -Follow urine output, BMP -Ensure adequate renal perfusion, optimize oxygenation -Renal dose medications   Intake/Output Summary (Last 24 hours) at 07/15/2023 0821 Last data filed at 07/15/2023 0751 Gross per 24 hour  Intake 2079.26 ml  Output 1960 ml  Net 119.26 ml        Latest Ref Rng & Units 07/15/2023    4:46 AM 07/14/2023    4:28 AM 07/13/2023    4:46 AM  BMP  Glucose 70 - 99 mg/dL 960  454  098   BUN 8 - 23 mg/dL 33  29  22   Creatinine 0.61 - 1.24 mg/dL 1.19  1.47  8.29   Sodium 135 - 145 mmol/L 148  140  140   Potassium 3.5 - 5.1 mmol/L  4.8  4.5  4.1   Chloride 98 - 111 mmol/L 110  107  106   CO2 22 - 32 mmol/L 31  26  25    Calcium 8.9 - 10.3 mg/dL 8.9  8.8  8.3     NEUROLOGY ACUTE METABOLIC ENCEPHALOPATHY -need for sedation -Goal RASS -2 to -3 PMHx: seizures -Treatment of hypercapnia and metabolic derangements as outlined above -Maintain a RASS goal of 0 to -1 -Fentanyl and Versed to maintain RASS goal -Avoid sedating medications as able -Daily wake up assessment    ENDO - ICU hypoglycemic\Hyperglycemia protocol -check FSBS per protocol   GI GI PROPHYLAXIS as indicated NUTRITIONAL STATUS DIET-->TF's as tolerated Constipation protocol as indicated   ELECTROLYTES -follow labs as needed -replace as needed -pharmacy consultation and following  RESTRICTIVE TRANSFUSION PROTOCOL TRANSFUSION  IF HGB<7  or ACTIVE BLEEDING OR DX of ACUTE CORONARY SYNDROMES    Best Practice (right click and "Reselect all SmartList Selections" daily)   Diet/type: NPO DVT prophylaxis: prophylactic heparin  GI prophylaxis: PPI Lines: N/A Foley:   Yes, and it is still needed Code Status:  full code Last date of multidisciplinary goals of care discussion [N/A]  2/21: Will update family when they arrive at bedside on plan of care.  Labs   CBC: Recent Labs  Lab 07/12/23 0812 07/13/23 0446 07/14/23 0428 07/15/23 0446  WBC 9.6 15.8* 14.2* 12.0*  NEUTROABS 8.3*  --   --   --   HGB 15.4 13.0 13.3 13.3  HCT 46.8 39.1 41.4 41.3  MCV 92.9 91.6 93.9 94.1  PLT 162 161 175 173    Basic Metabolic Panel: Recent Labs  Lab 07/12/23 0812 07/13/23 0446 07/13/23 1848 07/14/23 0428 07/14/23 1705 07/15/23 0446  NA 136 140  --  140  --  148*  K 4.5 4.1  --  4.5  --  4.8  CL 99 106  --  107  --  110  CO2 24 25  --  26  --  31  GLUCOSE 130* 140*  --  151*  --  161*  BUN 23 22  --  29*  --  33*  CREATININE 2.00* 1.27*  --  1.17  --  1.09  CALCIUM 8.6* 8.3*  --  8.8*  --  8.9  MG  --  2.2 2.2 2.7* 1.5* 2.9*  PHOS  --  3.0 3.2 3.2  3.2 1.7* 3.9  3.8   GFR: Estimated Creatinine Clearance: 62.8 mL/min (by C-G formula based on SCr of 1.09 mg/dL). Recent Labs  Lab 07/12/23 0812 07/12/23 1248 07/12/23 1450 07/12/23 1658 07/13/23 0446 07/14/23 0428 07/15/23 0446  PROCALCITON 3.56  --   --   --   --   --   --   WBC 9.6  --   --   --  15.8* 14.2* 12.0*  LATICACIDVEN 4.4* 1.5 1.9 1.6  --   --   --     Liver Function Tests: Recent Labs  Lab 07/12/23 0812 07/13/23 0446 07/14/23 0428 07/15/23 0446  AST 116*  --   --   --   ALT 29  --   --   --   ALKPHOS 58  --   --   --   BILITOT 0.6  --   --   --   PROT 7.1  --   --   --   ALBUMIN 3.7 2.9* 3.0* 2.9*   No results for input(s): "LIPASE", "AMYLASE" in the last 168 hours.  No results for input(s): "AMMONIA" in the last 168 hours.  ABG    Component Value Date/Time   PHART 7.34 (L) 07/13/2023 0425   PCO2ART 50 (H) 07/13/2023 0425   PO2ART 67 (L) 07/13/2023 0425   HCO3 27.0 07/13/2023 0425   ACIDBASEDEF 3.8 (H) 07/12/2023 1600   O2SAT 94.7 07/13/2023 0425      Coagulation Profile: Recent Labs  Lab 07/12/23 0812  INR 1.1    Cardiac Enzymes: No results for input(s): "CKTOTAL", "CKMB", "CKMBINDEX", "TROPONINI" in the last 168 hours.  HbA1C: Hgb A1c MFr Bld  Date/Time Value Ref Range Status  07/13/2023 04:46 AM 6.0 (H) 4.8 - 5.6 % Final    Comment:    (NOTE) Pre diabetes:          5.7%-6.4%  Diabetes:              >6.4%  Glycemic control for   <7.0% adults with diabetes   01/03/2018 10:17 AM 5.2 <5.7 % of total Hgb Final    Comment:    For the purpose of screening for the presence of diabetes: . <5.7%       Consistent with the absence of diabetes 5.7-6.4%    Consistent with increased risk for diabetes             (prediabetes) > or =6.5%  Consistent with diabetes . This assay result is consistent with a decreased risk of diabetes. . Currently, no consensus exists regarding use of hemoglobin A1c for diagnosis of diabetes in children. . According to American Diabetes Association (ADA) guidelines, hemoglobin A1c <7.0% represents optimal control in non-pregnant diabetic patients. Different metrics may apply to specific patient populations.  Standards of Medical Care in Diabetes(ADA). .     CBG: Recent Labs  Lab 07/14/23 1704 07/14/23 1924 07/15/23 0027 07/15/23 0435 07/15/23 0749  GLUCAP 153* 149* 162* 151* 117*      DVT/GI PRX  assessed I Assessed the need for Labs I Assessed the need for Foley I Assessed the need for Central Venous Line Family Discussion when available I Assessed the need for Mobilization I made an Assessment of medications to be adjusted accordingly Safety Risk assessment completed  CASE DISCUSSED IN MULTIDISCIPLINARY ROUNDS WITH ICU TEAM     Critical care provider statement:   Total critical care time: 33 minutes   Performed by: Karna Christmas MD   Critical care time was exclusive of separately billable procedures and treating other patients.   Critical care was necessary to  treat or prevent imminent or life-threatening deterioration.   Critical care was time spent personally by me on the following activities: development of treatment plan with patient and/or surrogate as well as nursing, discussions with consultants, evaluation of patient's response to treatment, examination of patient, obtaining history from patient or surrogate, ordering and performing treatments and interventions, ordering and review of laboratory studies, ordering and review of radiographic studies, pulse oximetry and re-evaluation of patient's condition.    Vida Rigger, M.D.  Pulmonary & Critical Care Medicine

## 2023-07-15 NOTE — Progress Notes (Signed)
 Nutrition Follow Up Note   DOCUMENTATION CODES:   Non-severe (moderate) malnutrition in context of chronic illness  INTERVENTION:   RD will add supplements with diet advancement   MVI, thiamine and folic acid po daily with diet advancement   Pt remains at refeed risk; recommend monitor potassium, magnesium and phosphorus labs daily until stable  Daily weights   NUTRITION DIAGNOSIS:   Moderate Malnutrition related to chronic illness as evidenced by severe fat depletion, severe muscle depletion. -new diagnosis   GOAL:   Patient will meet greater than or equal to 90% of their needs -previously met with tube feeds   MONITOR:   Diet advancement, Labs, Weight trends, I & O's, Skin  ASSESSMENT:   68 y/o male with h/o seizures, etoh abuse, GERD, liver disease, OSA, COPD and resides in a group home who is admitted with acute metabolic encephalopathy, severe sepsis with septic shock, AKI, acute hypoxic and hypercapnic respiratory failure in the setting of acute COPD exacerbation due to influenza A infection and superimposed bacterial pneumonia requiring intubation and mechanical ventilation.  Pt extubated today. Tube feeds initiated via OGT on 2/22 and pt was tolerating well prior to extubation. Will plan to add supplements with diet advancement. Pt remains at refeed risk. No BM since admission. Per chart, pt appears to be at his UBW currently.    Medications reviewed and include: lovenox,  insulin, solu-medrol, protonix, miralax, senokot, thiamine, azithromycin, ceftriaxone, levophed   Labs reviewed: Na 148(H), K 4.8 wnl, BUN 33(H), P 3.9 wnl, Mg 2.9(H) Wbc- 12.0(H) Cbgs- 97, 117, 151, 162 x 24 hrs   UOP-   NUTRITION - FOCUSED PHYSICAL EXAM:  Flowsheet Row Most Recent Value  Orbital Region Moderate depletion  Upper Arm Region Severe depletion  Thoracic and Lumbar Region No depletion  Buccal Region Moderate depletion  Temple Region Mild depletion  Clavicle Bone Region  Severe depletion  Clavicle and Acromion Bone Region Severe depletion  Scapular Bone Region No depletion  Dorsal Hand Unable to assess  Patellar Region Severe depletion  Anterior Thigh Region Severe depletion  Posterior Calf Region Severe depletion  Edema (RD Assessment) None  Hair Reviewed  Eyes Reviewed  Mouth Reviewed  Skin Reviewed  Nails Reviewed   Diet Order:   Diet Order             Diet NPO time specified  Diet effective now                  EDUCATION NEEDS:   No education needs have been identified at this time  Skin:  Skin Assessment: Reviewed RN Assessment  Last BM:  PTA  Height:   Ht Readings from Last 1 Encounters:  07/12/23 5' 7.99" (1.727 m)    Weight:   Wt Readings from Last 1 Encounters:  07/15/23 68.6 kg    Ideal Body Weight:  70 kg  BMI:  Body mass index is 23 kg/m.  Estimated Nutritional Needs:   Kcal:  1900-2200kcal/day  Protein:  95-110g/day  Fluid:  1.8-2.1L/day  Betsey Holiday MS, RD, LDN If unable to be reached, please send secure chat to "RD inpatient" available from 8:00a-4:00p daily

## 2023-07-15 NOTE — Progress Notes (Addendum)
 Extubation order written. Cuff leak noted. Patient extubated and placed on HHFNC on 40L @ 60% @ 1250.  Will continue to monitor.

## 2023-07-15 NOTE — Plan of Care (Signed)
  Problem: Activity: Goal: Ability to tolerate increased activity will improve 07/15/2023 1812 by Rich Fuchs, Student-RN Outcome: Progressing 07/15/2023 1806 by Rich Fuchs, Student-RN Outcome: Progressing   Problem: Respiratory: Goal: Ability to maintain a clear airway and adequate ventilation will improve 07/15/2023 1812 by Rich Fuchs, Student-RN Outcome: Progressing 07/15/2023 1806 by Rich Fuchs, Student-RN Outcome: Progressing   Problem: Role Relationship: Goal: Method of communication will improve 07/15/2023 1812 by Rich Fuchs, Student-RN Outcome: Progressing 07/15/2023 1806 by Rich Fuchs, Student-RN Outcome: Progressing   Problem: Clinical Measurements: Goal: Respiratory complications will improve 07/15/2023 1812 by Rich Fuchs, Student-RN Outcome: Progressing 07/15/2023 1806 by Rich Fuchs, Student-RN Outcome: Progressing Goal: Cardiovascular complication will be avoided 07/15/2023 1812 by Rich Fuchs, Student-RN Outcome: Progressing 07/15/2023 1806 by Rich Fuchs, Student-RN Outcome: Progressing   Problem: Activity: Goal: Risk for activity intolerance will decrease 07/15/2023 1812 by Rich Fuchs, Student-RN Outcome: Progressing 07/15/2023 1806 by Rich Fuchs, Student-RN Outcome: Progressing   Problem: Elimination: Goal: Will not experience complications related to urinary retention Outcome: Progressing   Problem: Pain Managment: Goal: General experience of comfort will improve and/or be controlled Outcome: Progressing   Problem: Fluid Volume: Goal: Ability to maintain a balanced intake and output will improve Outcome: Progressing   Problem: Education: Goal: Knowledge of General Education information will improve Description: Including pain rating scale, medication(s)/side effects and non-pharmacologic comfort measures 07/15/2023 1812 by Rich Fuchs, Student-RN Outcome: Not Progressing 07/15/2023 1806 by Rich Fuchs,  Student-RN Outcome: Not Progressing AMS   Problem: Coping: Goal: Level of anxiety will decrease 07/15/2023 1812 by Rich Fuchs, Student-RN Outcome: Not Progressing 07/15/2023 1806 by Rich Fuchs, Student-RN Outcome: Not Progressing AMS/line pulling   Problem: Elimination: Goal: Will not experience complications related to bowel motility Outcome: Not Progressing No BM during shift   Problem: Safety: Goal: Ability to remain free from injury will improve Outcome: Not Progressing  Some confusion/line pulling

## 2023-07-15 NOTE — Plan of Care (Signed)
  Problem: Respiratory: Goal: Ability to maintain a clear airway and adequate ventilation will improve Outcome: Progressing   Problem: Clinical Measurements: Goal: Ability to maintain clinical measurements within normal limits will improve Outcome: Progressing Goal: Will remain free from infection Outcome: Progressing Goal: Diagnostic test results will improve Outcome: Progressing Goal: Respiratory complications will improve Outcome: Progressing   Problem: Elimination: Goal: Will not experience complications related to bowel motility Outcome: Progressing

## 2023-07-16 LAB — CULTURE, RESPIRATORY W GRAM STAIN

## 2023-07-16 LAB — CBC
HCT: 40.8 % (ref 39.0–52.0)
Hemoglobin: 13.2 g/dL (ref 13.0–17.0)
MCH: 30.4 pg (ref 26.0–34.0)
MCHC: 32.4 g/dL (ref 30.0–36.0)
MCV: 94 fL (ref 80.0–100.0)
Platelets: 163 10*3/uL (ref 150–400)
RBC: 4.34 MIL/uL (ref 4.22–5.81)
RDW: 15 % (ref 11.5–15.5)
WBC: 10.6 10*3/uL — ABNORMAL HIGH (ref 4.0–10.5)
nRBC: 0.8 % — ABNORMAL HIGH (ref 0.0–0.2)

## 2023-07-16 LAB — RENAL FUNCTION PANEL
Albumin: 3.1 g/dL — ABNORMAL LOW (ref 3.5–5.0)
Anion gap: 11 (ref 5–15)
BUN: 29 mg/dL — ABNORMAL HIGH (ref 8–23)
CO2: 32 mmol/L (ref 22–32)
Calcium: 9.6 mg/dL (ref 8.9–10.3)
Chloride: 109 mmol/L (ref 98–111)
Creatinine, Ser: 0.97 mg/dL (ref 0.61–1.24)
GFR, Estimated: >60 mL/min (ref >60)
Glucose, Bld: 72 mg/dL (ref 70–99)
Phosphorus: 2.2 mg/dL — ABNORMAL LOW (ref 2.5–4.6)
Potassium: 4.2 mmol/L (ref 3.5–5.1)
Sodium: 152 mmol/L — ABNORMAL HIGH (ref 135–145)

## 2023-07-16 LAB — GLUCOSE, CAPILLARY
Glucose-Capillary: 68 mg/dL — ABNORMAL LOW (ref 70–99)
Glucose-Capillary: 75 mg/dL (ref 70–99)
Glucose-Capillary: 81 mg/dL (ref 70–99)
Glucose-Capillary: 158 mg/dL — ABNORMAL HIGH (ref 70–99)
Glucose-Capillary: 212 mg/dL — ABNORMAL HIGH (ref 70–99)
Glucose-Capillary: 76 mg/dL (ref 70–99)

## 2023-07-16 LAB — MYCOPLASMA PNEUMONIAE ANTIBODY, IGM: Mycoplasma pneumo IgM: <770 U/mL (ref 0–769)

## 2023-07-16 LAB — MAGNESIUM: Magnesium: 2.6 mg/dL — ABNORMAL HIGH (ref 1.7–2.4)

## 2023-07-16 MED ORDER — SERTRALINE HCL 50 MG PO TABS
25.0000 mg | ORAL_TABLET | Freq: Every day | ORAL | Status: DC
  Administered 2023-07-17 – 2023-08-12 (×27): 25 mg via ORAL
  Filled 2023-07-16 (×11): qty 1
  Filled 2023-07-16: qty 0.5
  Filled 2023-07-16 (×15): qty 1

## 2023-07-16 MED ORDER — CHLORHEXIDINE GLUCONATE CLOTH 2 % EX PADS
6.0000 | MEDICATED_PAD | Freq: Every day | CUTANEOUS | Status: DC
  Administered 2023-07-16 – 2023-07-20 (×4): 6 via TOPICAL

## 2023-07-16 MED ORDER — FOLIC ACID 1 MG PO TABS
1.0000 mg | ORAL_TABLET | Freq: Every day | ORAL | Status: DC
  Administered 2023-07-17 – 2023-08-12 (×27): 1 mg via ORAL
  Filled 2023-07-16 (×27): qty 1

## 2023-07-16 MED ORDER — INSULIN ASPART 100 UNIT/ML IJ SOLN
0.0000 [IU] | Freq: Three times a day (TID) | INTRAMUSCULAR | Status: DC
  Administered 2023-07-16: 5 [IU] via SUBCUTANEOUS
  Filled 2023-07-16: qty 1

## 2023-07-16 MED ORDER — TRAZODONE HCL 50 MG PO TABS
50.0000 mg | ORAL_TABLET | Freq: Every evening | ORAL | Status: DC | PRN
  Administered 2023-07-16 – 2023-08-07 (×5): 50 mg via ORAL
  Filled 2023-07-16 (×5): qty 1

## 2023-07-16 MED ORDER — K PHOS MONO-SOD PHOS DI & MONO 155-852-130 MG PO TABS
500.0000 mg | ORAL_TABLET | Freq: Once | ORAL | Status: AC
  Administered 2023-07-16: 500 mg via ORAL
  Filled 2023-07-16 (×2): qty 2

## 2023-07-16 MED ORDER — INSULIN ASPART 100 UNIT/ML IJ SOLN: 0.0000 [IU] | Freq: Every day | INTRAMUSCULAR | Status: DC

## 2023-07-16 MED ORDER — POLYETHYLENE GLYCOL 3350 17 G PO PACK
17.0000 g | PACK | Freq: Every day | ORAL | Status: DC
  Administered 2023-07-16 – 2023-07-31 (×13): 17 g via ORAL
  Filled 2023-07-16 (×15): qty 1

## 2023-07-16 MED ORDER — ADULT MULTIVITAMIN W/MINERALS CH
1.0000 | ORAL_TABLET | Freq: Every day | ORAL | Status: DC
  Administered 2023-07-17 – 2023-08-12 (×27): 1 via ORAL
  Filled 2023-07-16 (×27): qty 1

## 2023-07-16 MED ORDER — DEXTROSE 5 % IV SOLN: INTRAVENOUS | Status: DC

## 2023-07-16 MED ORDER — ENSURE ENLIVE PO LIQD
237.0000 mL | Freq: Three times a day (TID) | ORAL | Status: DC
  Administered 2023-07-16 – 2023-08-12 (×67): 237 mL via ORAL

## 2023-07-16 MED ORDER — PANTOPRAZOLE SODIUM 40 MG PO TBEC
40.0000 mg | DELAYED_RELEASE_TABLET | Freq: Every day | ORAL | Status: DC
  Administered 2023-07-16 – 2023-07-31 (×16): 40 mg via ORAL
  Filled 2023-07-16 (×16): qty 1

## 2023-07-16 MED ORDER — INSULIN ASPART 100 UNIT/ML IJ SOLN: 0.0000 [IU] | Freq: Three times a day (TID) | INTRAMUSCULAR | Status: DC

## 2023-07-16 MED ORDER — IPRATROPIUM-ALBUTEROL 0.5-2.5 (3) MG/3ML IN SOLN
3.0000 mL | RESPIRATORY_TRACT | Status: DC | PRN
  Administered 2023-07-18: 3 mL via RESPIRATORY_TRACT
  Filled 2023-07-16 (×2): qty 3

## 2023-07-16 NOTE — Progress Notes (Signed)
 Nutrition Follow Up Note   DOCUMENTATION CODES:   Non-severe (moderate) malnutrition in context of chronic illness  INTERVENTION:   Ensure Enlive po TID, each supplement provides 350 kcal and 20 grams of protein.  MVI, thiamine and folic acid po daily   Pt remains at refeed risk; recommend monitor potassium, magnesium and phosphorus labs daily until stable  Daily weights   NUTRITION DIAGNOSIS:   Moderate Malnutrition related to chronic illness as evidenced by severe fat depletion, severe muscle depletion. -ongoing   GOAL:   Patient will meet greater than or equal to 90% of their needs -previously met with tube feeds   MONITOR:   PO intake, Supplement acceptance, Labs, Weight trends, I & O's, Skin  ASSESSMENT:   68 y/o male with h/o seizures, etoh abuse, GERD, liver disease, OSA, COPD and resides in a group home who is admitted with acute metabolic encephalopathy, severe sepsis with septic shock, AKI, acute hypoxic and hypercapnic respiratory failure in the setting of acute COPD exacerbation due to influenza A infection and superimposed bacterial pneumonia requiring intubation and mechanical ventilation.  Met with pt in room today. Pt SOB today so did not obtain extensive history. Pt initiated on a regular diet today. Pt reports that he is not feeling very hungry today but reports that he did eat some breakfast. Per RN, pt ate 25% of breakfast with assistance. RD discussed with pt the importance of adequate nutrition needed to preserve lean muscle. Pt is agreeable to drinking chocolate Ensure. RD will add supplements and vitamins to help pt meet his estimated needs. Pt remains at refeed risk. Pt with worsening hypernatremia. Per chart, pt is down ~7lbs from admission. Pt +1.8L on his I & Os. Pt did have a small BM this morning.   Medications reviewed and include: lovenox,  insulin, solu-medrol, nicotine, protonix, Kphos, miralax, senokot, thiamine  Labs reviewed: Na 152(H), K 4.2  wnl, BUN 29(H), P 2.2(L), Mg 2.6(H) Wbc- 10.6(H) Cbgs- 158, 81, 75, 68 x 24 hrs   UOP-   Diet Order:   Diet Order             Diet regular Room service appropriate? Yes; Fluid consistency: Thin  Diet effective now                  EDUCATION NEEDS:   No education needs have been identified at this time  Skin:  Skin Assessment: Reviewed RN Assessment  Last BM:  2/25  Height:   Ht Readings from Last 1 Encounters:  07/12/23 5' 7.99" (1.727 m)    Weight:   Wt Readings from Last 1 Encounters:  07/16/23 66.9 kg    Ideal Body Weight:  70 kg  BMI:  Body mass index is 22.43 kg/m.  Estimated Nutritional Needs:   Kcal:  1900-2200kcal/day  Protein:  95-110g/day  Fluid:  1.8-2.1L/day  Betsey Holiday MS, RD, LDN If unable to be reached, please send secure chat to "RD inpatient" available from 8:00a-4:00p daily

## 2023-07-16 NOTE — Plan of Care (Signed)
  Problem: Activity: Goal: Ability to tolerate increased activity will improve Outcome: Progressing   Problem: Respiratory: Goal: Ability to maintain a clear airway and adequate ventilation will improve Outcome: Progressing   Problem: Role Relationship: Goal: Method of communication will improve Outcome: Progressing   Problem: Education: Goal: Knowledge of General Education information will improve Description: Including pain rating scale, medication(s)/side effects and non-pharmacologic comfort measures Outcome: Progressing   Problem: Health Behavior/Discharge Planning: Goal: Ability to manage health-related needs will improve Outcome: Progressing   Problem: Clinical Measurements: Goal: Ability to maintain clinical measurements within normal limits will improve Outcome: Progressing Goal: Will remain free from infection Outcome: Progressing Goal: Diagnostic test results will improve Outcome: Progressing Goal: Respiratory complications will improve Outcome: Progressing Goal: Cardiovascular complication will be avoided Outcome: Progressing   Problem: Activity: Goal: Risk for activity intolerance will decrease Outcome: Progressing   Problem: Nutrition: Goal: Adequate nutrition will be maintained Outcome: Progressing   Problem: Coping: Goal: Level of anxiety will decrease Outcome: Progressing   Problem: Elimination: Goal: Will not experience complications related to bowel motility Outcome: Progressing Goal: Will not experience complications related to urinary retention Outcome: Progressing   Problem: Pain Managment: Goal: General experience of comfort will improve and/or be controlled Outcome: Progressing   Problem: Safety: Goal: Ability to remain free from injury will improve Outcome: Progressing   Problem: Skin Integrity: Goal: Risk for impaired skin integrity will decrease Outcome: Progressing   Problem: Education: Goal: Ability to describe self-care  measures that may prevent or decrease complications (Diabetes Survival Skills Education) will improve Outcome: Progressing Goal: Individualized Educational Video(s) Outcome: Progressing   Problem: Coping: Goal: Ability to adjust to condition or change in health will improve Outcome: Progressing   Problem: Fluid Volume: Goal: Ability to maintain a balanced intake and output will improve Outcome: Progressing   Problem: Health Behavior/Discharge Planning: Goal: Ability to identify and utilize available resources and services will improve Outcome: Progressing Goal: Ability to manage health-related needs will improve Outcome: Progressing   Problem: Metabolic: Goal: Ability to maintain appropriate glucose levels will improve Outcome: Progressing   Problem: Nutritional: Goal: Maintenance of adequate nutrition will improve Outcome: Progressing Goal: Progress toward achieving an optimal weight will improve Outcome: Progressing   Problem: Skin Integrity: Goal: Risk for impaired skin integrity will decrease Outcome: Progressing   Problem: Tissue Perfusion: Goal: Adequacy of tissue perfusion will improve Outcome: Progressing

## 2023-07-16 NOTE — Progress Notes (Signed)
 NAME:  Jeremiah Nguyen, MRN:  [id number], DOB:  24-May-1955, LOS: 4 ADMISSION DATE:  07/12/2023, CONSULTATION DATE:  07/12/23 REFERRING MD:  Dr. Scotty Court, CHIEF COMPLAINT:  AMS   Brief Pt Description / Synopsis:  68 year old male admitted with acute metabolic encephalopathy, severe sepsis with septic shock, and acute hypoxic and hypercapnic respiratory failure in the setting of acute COPD exacerbation due to influenza A infection and superimposed bacterial pneumonia requiring intubation and mechanical ventilation.  History of Present Illness:  Jeremiah Nguyen is a 68 year old male with a past medical history significant for COPD, alcohol dependence, and seizure disorder who presented to [facility] ED on 07/12/2023 from his group home due to altered mental status.  Patient is currently intubated and sedated and no family is present, therefore history is obtained from chart review.  Per report he has been altered over the last 24 hours, not taking his medications this morning.  Upon EMS arrival he was noted to be hypoxic with O2 sats of 60% on room air, and they noted wheezing and stridor which they gave IM epi.  On arrival to the ED he remained somnolent and altered, oriented only to self.  Given his respiratory distress he was placed on BiPAP.  ED did note that his left arm and left leg demonstrated relative weakness compared to the right. PCCM asked to admit for further workup and treatment.  Please see "Significant Hospital Events" section below for full detailed hospital course.  07/15/23- patient passed SBT today.  He is liberated from MV on to [facility]. Remains with encephalopathy.  07/16/23- patient is improved overnight. He is on home psychiatric medications now. We are refining therapy and will work on [facility] transfer today. Diet has been ordered.   Pertinent  Medical History   Past Medical History:  Diagnosis Date   COPD (chronic obstructive pulmonary disease) (HCC)    Eating disorder    GERD  (gastroesophageal reflux disease)    Liver disease    Seizures (HCC)    Sleep apnea     Micro Data:  2/21: COVID/RSV/flu PCR>> + influenza A 2/21: Blood culture x 2>> 2/21: Strep pneumo urinary antigen>> 2/21: Legionella urine antigen>> 2/21: Mycoplasma pneumoniae IgM>> 2/21: MRSA PCR>> 2/21: Tracheal aspirate>>  Antimicrobials:   Anti-infectives (From admission, onward)    Start     Dose/Rate Route Frequency Ordered Stop   07/15/23 2200  oseltamivir (TAMIFLU) capsule 75 mg        75 mg Oral 2 times daily 07/15/23 1945 07/17/23 2159   07/15/23 1000  oseltamivir (TAMIFLU) capsule 75 mg  Status:  Discontinued        75 mg Per Tube 2 times daily 07/15/23 0829 07/15/23 1945   07/13/23 1000  azithromycin (ZITHROMAX) 500 mg in sodium chloride 0.9 % 250 mL IVPB        500 mg 250 mL/hr over 60 Minutes Intravenous Every 24 hours 07/12/23 1312 07/17/23 0959   07/13/23 1000  cefTRIAXone (ROCEPHIN) 2 g in sodium chloride 0.9 % 100 mL IVPB        2 g 200 mL/hr over 30 Minutes Intravenous Every 24 hours 07/12/23 1312     07/12/23 1800  oseltamivir (TAMIFLU) capsule 30 mg  Status:  Discontinued        30 mg Per Tube 2 times daily 07/12/23 1518 07/15/23 0829   07/12/23 0815  cefTRIAXone (ROCEPHIN) 2 g in sodium chloride 0.9 % 100 mL IVPB        2 g  200 mL/hr over 30 Minutes Intravenous Once 07/12/23 0806 07/12/23 0900   07/12/23 0815  azithromycin (ZITHROMAX) 500 mg in sodium chloride 0.9 % 250 mL IVPB        500 mg 250 mL/hr over 60 Minutes Intravenous  Once 07/12/23 0806 07/12/23 0930        Significant Hospital Events: Including procedures, antibiotic start and stop dates in addition to other pertinent events   2/21: Presented to ED from group home with altered mental status, hypertension.  Required intubation in the ED by ED provider and initiation of Levophed despite IV fluid resuscitation.  PCCM asked to admit. 2/22 rermains on vent 2/23      Objective   Blood pressure  139/74, pulse (!) 101, temperature 99.1 F (37.3 C), temperature source Oral, resp. rate (!) 24, height 5' 7.99" (1.727 m), weight 66.9 kg, SpO2 93%.    Vent Mode: PSV FiO2 (%):  [40 %-60 %] 50 % PEEP:  [5 cmH20] 5 cmH20 Pressure Support:  [5 cmH20] 5 cmH20   Intake/Output Summary (Last 24 hours) at 07/16/2023 1059 Last data filed at 07/16/2023 1002 Gross per 24 hour  Intake 718.03 ml  Output 1950 ml  Net -1231.97 ml   Filed Weights   07/14/23 0438 07/15/23 0500 07/16/23 0422  Weight: 69.5 kg 68.6 kg 66.9 kg      REVIEW OF SYSTEMS  PATIENT IS UNABLE TO PROVIDE COMPLETE REVIEW OF SYSTEMS DUE TO SEVERE CRITICAL ILLNESS   PHYSICAL EXAMINATION:  GENERAL:chronically ill NAD EYES: Pupils equal, round, reactive to light.  No scleral icterus.  MOUTH: Moist mucosal membrane.  NECK: Supple.  PULMONARY: Lungs clear to auscultation,  CARDIOVASCULAR: S1 and S2.  Regular rate and rhythm GASTROINTESTINAL: Soft, nontender, -distended. Positive bowel sounds.  MUSCULOSKELETAL: No swelling, clubbing, or edema.  NEUROLOGIC:no FND grossly  SKIN:normal, warm to touch, Capillary refill delayed  Pulses present bilaterally    Assessment & Plan:  68 yo male with  Acute Hypoxic & Hypercapnic Respiratory Failure in the setting of Acute COPD Exacerbation from Influenza A infection and superimposed bacterial pneumonia PMHx: COPD, OSA  Severe ACUTE Hypoxic and Hypercapnic Respiratory Failure -continue full scope of therapy - antibiotics , IS, nebulizer therapy   SEPTIC shock-PRESENT ON ADMISSION - NOW RESOLVED SOURCE-flu, pneumonia   INFECTIOUS DISEASE -Continue empiric Azithromycin, Ceftriaxone, and Tamiflu pending cultures & sensitivities   ACUTE KIDNEY INJURY/Renal Failure -continue Foley Catheter-assess need -Avoid nephrotoxic agents -Follow urine output, BMP -Ensure adequate renal perfusion, optimize oxygenation -Renal dose medications   Intake/Output Summary (Last 24 hours) at  07/16/2023 1059 Last data filed at 07/16/2023 1002 Gross per 24 hour  Intake 718.03 ml  Output 1950 ml  Net -1231.97 ml        Latest Ref Rng & Units 07/16/2023    4:15 AM 07/15/2023    4:46 AM 07/14/2023    4:28 AM  BMP  Glucose 70 - 99 mg/dL 72  132  440   BUN 8 - 23 mg/dL 29  33  29   Creatinine 0.61 - 1.24 mg/dL 1.02  7.25  3.66   Sodium 135 - 145 mmol/L 152  148  140   Potassium 3.5 - 5.1 mmol/L 4.2  4.8  4.5   Chloride 98 - 111 mmol/L 109  110  107   CO2 22 - 32 mmol/L 32  31  26   Calcium 8.9 - 10.3 mg/dL 9.6  8.9  8.8     NEUROLOGY ACUTE METABOLIC ENCEPHALOPATHY -need  for sedation -Goal RASS -2 to -3 PMHx: seizures -Treatment of hypercapnia and metabolic derangements as outlined above -Maintain a RASS goal of 0 to -1 -Fentanyl and Versed to maintain RASS goal -Avoid sedating medications as able -Daily wake up assessment    ENDO - ICU hypoglycemic\Hyperglycemia protocol -check FSBS per protocol   GI GI PROPHYLAXIS as indicated NUTRITIONAL STATUS DIET-->TF's as tolerated Constipation protocol as indicated   ELECTROLYTES -follow labs as needed -replace as needed -pharmacy consultation and following  RESTRICTIVE TRANSFUSION PROTOCOL TRANSFUSION  IF HGB<7  or ACTIVE BLEEDING OR DX of ACUTE CORONARY SYNDROMES    Best Practice (right click and "Reselect all SmartList Selections" daily)   Diet/type: NPO DVT prophylaxis: prophylactic heparin  GI prophylaxis: PPI Lines: N/A Foley:  Yes, and it is still needed Code Status:  full code Last date of multidisciplinary goals of care discussion [N/A]  2/21: Will update family when they arrive at bedside on plan of care.  Labs   CBC: Recent Labs  Lab 07/12/23 0812 07/13/23 0446 07/14/23 0428 07/15/23 0446 07/16/23 0415  WBC 9.6 15.8* 14.2* 12.0* 10.6*  NEUTROABS 8.3*  --   --   --   --   HGB 15.4 13.0 13.3 13.3 13.2  HCT 46.8 39.1 41.4 41.3 40.8  MCV 92.9 91.6 93.9 94.1 94.0  PLT 162 161 175 173  163    Basic Metabolic Panel: Recent Labs  Lab 07/12/23 0812 07/13/23 0446 07/13/23 0446 07/13/23 1848 07/14/23 0428 07/14/23 1705 07/15/23 0446 07/16/23 0415  NA 136 140  --   --  140  --  148* 152*  K 4.5 4.1  --   --  4.5  --  4.8 4.2  CL 99 106  --   --  107  --  110 109  CO2 24 25  --   --  26  --  31 32  GLUCOSE 130* 140*  --   --  151*  --  161* 72  BUN 23 22  --   --  29*  --  33* 29*  CREATININE 2.00* 1.27*  --   --  1.17  --  1.09 0.97  CALCIUM 8.6* 8.3*  --   --  8.8*  --  8.9 9.6  MG  --  2.2   < > 2.2 2.7* 1.5* 2.9* 2.6*  PHOS  --  3.0   < > 3.2 3.2  3.2 1.7* 3.9  3.8 2.2*   < > = values in this interval not displayed.   GFR: Estimated Creatinine Clearance: 69 mL/min (by C-G formula based on SCr of 0.97 mg/dL). Recent Labs  Lab 07/12/23 0812 07/12/23 1248 07/12/23 1450 07/12/23 1658 07/13/23 0446 07/14/23 0428 07/15/23 0446 07/16/23 0415  PROCALCITON 3.56  --   --   --   --   --   --   --   WBC 9.6  --   --   --  15.8* 14.2* 12.0* 10.6*  LATICACIDVEN 4.4* 1.5 1.9 1.6  --   --   --   --     Liver Function Tests: Recent Labs  Lab 07/12/23 0812 07/13/23 0446 07/14/23 0428 07/15/23 0446 07/16/23 0415  AST 116*  --   --   --   --   ALT 29  --   --   --   --   ALKPHOS 58  --   --   --   --   BILITOT 0.6  --   --   --   --  PROT 7.1  --   --   --   --   ALBUMIN 3.7 2.9* 3.0* 2.9* 3.1*   No results for input(s): "LIPASE", "AMYLASE" in the last 168 hours. No results for input(s): "AMMONIA" in the last 168 hours.  ABG    Component Value Date/Time   PHART 7.34 (L) 07/13/2023 0425   PCO2ART 50 (H) 07/13/2023 0425   PO2ART 67 (L) 07/13/2023 0425   HCO3 27.0 07/13/2023 0425   ACIDBASEDEF 3.8 (H) 07/12/2023 1600   O2SAT 94.7 07/13/2023 0425     Coagulation Profile: Recent Labs  Lab 07/12/23 0812  INR 1.1    Cardiac Enzymes: No results for input(s): "CKTOTAL", "CKMB", "CKMBINDEX", "TROPONINI" in the last 168 hours.  HbA1C: Hgb A1c MFr  Bld  Date/Time Value Ref Range Status  07/13/2023 04:46 AM 6.0 (H) 4.8 - 5.6 % Final    Comment:    (NOTE) Pre diabetes:          5.7%-6.4%  Diabetes:              >6.4%  Glycemic control for   <7.0% adults with diabetes   01/03/2018 10:17 AM 5.2 <5.7 % of total Hgb Final    Comment:    For the purpose of screening for the presence of diabetes: . <5.7%       Consistent with the absence of diabetes 5.7-6.4%    Consistent with increased risk for diabetes             (prediabetes) > or =6.5%  Consistent with diabetes . This assay result is consistent with a decreased risk of diabetes. . Currently, no consensus exists regarding use of hemoglobin A1c for diagnosis of diabetes in children. . According to American Diabetes Association (ADA) guidelines, hemoglobin A1c <7.0% represents optimal control in non-pregnant diabetic patients. Different metrics may apply to specific patient populations.  Standards of Medical Care in Diabetes(ADA). .     CBG: Recent Labs  Lab 07/15/23 1959 07/15/23 2304 07/16/23 0353 07/16/23 0413 07/16/23 0715  GLUCAP 73 98 68* 75 81      DVT/GI PRX  assessed I Assessed the need for Labs I Assessed the need for Foley I Assessed the need for Central Venous Line Family Discussion when available I Assessed the need for Mobilization I made an Assessment of medications to be adjusted accordingly Safety Risk assessment completed  CASE DISCUSSED IN MULTIDISCIPLINARY ROUNDS WITH ICU TEAM     Critical care provider statement:   Total critical care time: 33 minutes   Performed by: Karna Christmas MD   Critical care time was exclusive of separately billable procedures and treating other patients.   Critical care was necessary to treat or prevent imminent or life-threatening deterioration.   Critical care was time spent personally by me on the following activities: development of treatment plan with patient and/or surrogate as well as nursing,  discussions with consultants, evaluation of patient's response to treatment, examination of patient, obtaining history from patient or surrogate, ordering and performing treatments and interventions, ordering and review of laboratory studies, ordering and review of radiographic studies, pulse oximetry and re-evaluation of patient's condition.    Vida Rigger, M.D.  Pulmonary & Critical Care Medicine

## 2023-07-16 NOTE — Plan of Care (Signed)
  Problem: Education: Goal: Knowledge of General Education information will improve Description: Including pain rating scale, medication(s)/side effects and non-pharmacologic comfort measures Outcome: Progressing   Problem: Health Behavior/Discharge Planning: Goal: Ability to manage health-related needs will improve Outcome: Progressing   Problem: Clinical Measurements: Goal: Will remain free from infection Outcome: Progressing Goal: Diagnostic test results will improve Outcome: Progressing Goal: Respiratory complications will improve Outcome: Progressing   Problem: Nutrition: Goal: Adequate nutrition will be maintained Outcome: Progressing   Problem: Coping: Goal: Level of anxiety will decrease Outcome: Progressing

## 2023-07-17 DIAGNOSIS — J9601 Acute respiratory failure with hypoxia: Secondary | ICD-10-CM | POA: Diagnosis not present

## 2023-07-17 LAB — CBC
HCT: 42.6 % (ref 39.0–52.0)
Hemoglobin: 13.8 g/dL (ref 13.0–17.0)
MCH: 30.5 pg (ref 26.0–34.0)
MCHC: 32.4 g/dL (ref 30.0–36.0)
MCV: 94.2 fL (ref 80.0–100.0)
Platelets: 147 10*3/uL — ABNORMAL LOW (ref 150–400)
RBC: 4.52 MIL/uL (ref 4.22–5.81)
RDW: 14.6 % (ref 11.5–15.5)
WBC: 8.9 10*3/uL (ref 4.0–10.5)
nRBC: 0.4 % — ABNORMAL HIGH (ref 0.0–0.2)

## 2023-07-17 LAB — RENAL FUNCTION PANEL
Albumin: 3 g/dL — ABNORMAL LOW (ref 3.5–5.0)
Anion gap: 8 (ref 5–15)
BUN: 21 mg/dL (ref 8–23)
CO2: 32 mmol/L (ref 22–32)
Calcium: 9.1 mg/dL (ref 8.9–10.3)
Chloride: 105 mmol/L (ref 98–111)
Creatinine, Ser: 0.94 mg/dL (ref 0.61–1.24)
GFR, Estimated: >60 mL/min (ref >60)
Glucose, Bld: 93 mg/dL (ref 70–99)
Phosphorus: 2.3 mg/dL — ABNORMAL LOW (ref 2.5–4.6)
Potassium: 3.7 mmol/L (ref 3.5–5.1)
Sodium: 145 mmol/L (ref 135–145)

## 2023-07-17 LAB — MAGNESIUM: Magnesium: 2.1 mg/dL (ref 1.7–2.4)

## 2023-07-17 LAB — CULTURE, BLOOD (ROUTINE X 2)
Culture: NO GROWTH
Culture: NO GROWTH

## 2023-07-17 LAB — GLUCOSE, CAPILLARY
Glucose-Capillary: 103 mg/dL — ABNORMAL HIGH (ref 70–99)
Glucose-Capillary: 150 mg/dL — ABNORMAL HIGH (ref 70–99)
Glucose-Capillary: 167 mg/dL — ABNORMAL HIGH (ref 70–99)

## 2023-07-17 MED ORDER — METOPROLOL SUCCINATE ER 50 MG PO TB24
25.0000 mg | ORAL_TABLET | Freq: Every day | ORAL | Status: DC
  Administered 2023-07-17 – 2023-07-18 (×2): 25 mg via ORAL
  Filled 2023-07-17 (×2): qty 1

## 2023-07-17 MED ORDER — CHLORPROMAZINE HCL 50 MG PO TABS
50.0000 mg | ORAL_TABLET | Freq: Four times a day (QID) | ORAL | Status: DC
  Administered 2023-07-17 – 2023-08-12 (×101): 50 mg via ORAL
  Filled 2023-07-17 (×108): qty 1

## 2023-07-17 MED ORDER — ASPIRIN 81 MG PO TBEC
81.0000 mg | DELAYED_RELEASE_TABLET | Freq: Every day | ORAL | Status: DC
  Administered 2023-07-18 – 2023-07-31 (×14): 81 mg via ORAL
  Filled 2023-07-17 (×14): qty 1

## 2023-07-17 NOTE — Progress Notes (Signed)
 Nutrition Follow Up Note   DOCUMENTATION CODES:   Non-severe (moderate) malnutrition in context of chronic illness  INTERVENTION:   Recommend NGT placement and nutrition support   If feeding tube placed, recommend:  Osmolite 1.5@60ml /hr- Initiate at 81ml/hr and increase by 37ml/hr q 8 hours until goal rate is reached.   ProSource TF 20- Give 60ml daily via tube, each supplement provides 80kcal and 20g of protein.   Free water flushes q4 hours   Regimen provides 2240kcal/day, 110g/day protein and 1679ml/day of free water.   Ensure Enlive po TID, each supplement provides 350 kcal and 20 grams of protein.  MVI, thiamine and folic acid po daily   Pt remains at refeed risk; recommend monitor potassium, magnesium and phosphorus labs daily until stable  Daily weights   NUTRITION DIAGNOSIS:   Moderate Malnutrition related to chronic illness as evidenced by severe fat depletion, severe muscle depletion. -ongoing   GOAL:   Patient will meet greater than or equal to 90% of their needs -previously met with tube feeds   MONITOR:   PO intake, Supplement acceptance, Labs, Weight trends, I & O's, Skin  ASSESSMENT:   68 y/o male with h/o seizures, etoh abuse, GERD, liver disease, OSA, COPD and resides in a group home who is admitted with acute metabolic encephalopathy, severe sepsis with septic shock, AKI, acute hypoxic and hypercapnic respiratory failure in the setting of acute COPD exacerbation due to influenza A infection and superimposed bacterial pneumonia requiring intubation and mechanical ventilation.  Pt continues to have poor appetite and oral intake; pt eating <25% of meals in hospital. Pt ate 20% of his breakfast this morning. Pt refusing most of the Ensure. Recommend NGT placement and nutrition support; this was discussed with MD. Pt remains at refeed risk. Hypernatremia resolved. Pt is refeeding. Per chart, pt is down ~7lbs since admission. Pt +826ml on his I & Os.     Medications reviewed and include: lovenox,  folic acid, solu-medrol, MVI, nicotine, protonix, miralax, thiamine  Labs reviewed: Na 145 wnl, K 3.7 wnl, P 2.3(L), Mg 2.1 wnl Cbgs- 150, 103 x 24 hrs   UOP-   Diet Order:   Diet Order             Diet regular Room service appropriate? Yes; Fluid consistency: Thin  Diet effective now                  EDUCATION NEEDS:   No education needs have been identified at this time  Skin:  Skin Assessment: Reviewed RN Assessment  Last BM:  2/26- TYPE 5  Height:   Ht Readings from Last 1 Encounters:  07/12/23 5' 7.99" (1.727 m)    Weight:   Wt Readings from Last 1 Encounters:  07/17/23 66.9 kg    Ideal Body Weight:  70 kg  BMI:  Body mass index is 22.43 kg/m.  Estimated Nutritional Needs:   Kcal:  1900-2200kcal/day  Protein:  95-110g/day  Fluid:  1.8-2.1L/day  Betsey Holiday MS, RD, LDN If unable to be reached, please send secure chat to "RD inpatient" available from 8:00a-4:00p daily

## 2023-07-17 NOTE — Plan of Care (Signed)
  Problem: Activity: Goal: Ability to tolerate increased activity will improve Outcome: Progressing   Problem: Respiratory: Goal: Ability to maintain a clear airway and adequate ventilation will improve Outcome: Progressing   Problem: Role Relationship: Goal: Method of communication will improve Outcome: Progressing   Problem: Education: Goal: Knowledge of General Education information will improve Description: Including pain rating scale, medication(s)/side effects and non-pharmacologic comfort measures Outcome: Progressing   Problem: Health Behavior/Discharge Planning: Goal: Ability to manage health-related needs will improve Outcome: Progressing   Problem: Clinical Measurements: Goal: Ability to maintain clinical measurements within normal limits will improve Outcome: Progressing Goal: Will remain free from infection Outcome: Progressing Goal: Diagnostic test results will improve Outcome: Progressing Goal: Respiratory complications will improve Outcome: Progressing Goal: Cardiovascular complication will be avoided Outcome: Progressing   Problem: Activity: Goal: Risk for activity intolerance will decrease Outcome: Progressing   Problem: Nutrition: Goal: Adequate nutrition will be maintained Outcome: Progressing   Problem: Coping: Goal: Level of anxiety will decrease Outcome: Progressing   Problem: Elimination: Goal: Will not experience complications related to bowel motility Outcome: Progressing Goal: Will not experience complications related to urinary retention Outcome: Progressing   Problem: Pain Managment: Goal: General experience of comfort will improve and/or be controlled Outcome: Progressing   Problem: Safety: Goal: Ability to remain free from injury will improve Outcome: Progressing   Problem: Skin Integrity: Goal: Risk for impaired skin integrity will decrease Outcome: Progressing   Problem: Education: Goal: Ability to describe self-care  measures that may prevent or decrease complications (Diabetes Survival Skills Education) will improve Outcome: Progressing Goal: Individualized Educational Video(s) Outcome: Progressing   Problem: Coping: Goal: Ability to adjust to condition or change in health will improve Outcome: Progressing   Problem: Fluid Volume: Goal: Ability to maintain a balanced intake and output will improve Outcome: Progressing   Problem: Health Behavior/Discharge Planning: Goal: Ability to identify and utilize available resources and services will improve Outcome: Progressing Goal: Ability to manage health-related needs will improve Outcome: Progressing   Problem: Metabolic: Goal: Ability to maintain appropriate glucose levels will improve Outcome: Progressing   Problem: Nutritional: Goal: Maintenance of adequate nutrition will improve Outcome: Progressing Goal: Progress toward achieving an optimal weight will improve Outcome: Progressing   Problem: Skin Integrity: Goal: Risk for impaired skin integrity will decrease Outcome: Progressing   Problem: Tissue Perfusion: Goal: Adequacy of tissue perfusion will improve Outcome: Progressing

## 2023-07-17 NOTE — Plan of Care (Signed)
  Problem: Respiratory: Goal: Ability to maintain a clear airway and adequate ventilation will improve Outcome: Progressing   Problem: Clinical Measurements: Goal: Ability to maintain clinical measurements within normal limits will improve Outcome: Progressing Goal: Will remain free from infection Outcome: Progressing Goal: Respiratory complications will improve Outcome: Progressing Goal: Cardiovascular complication will be avoided Outcome: Progressing   Problem: Nutrition: Goal: Adequate nutrition will be maintained Outcome: Progressing   Problem: Coping: Goal: Level of anxiety will decrease Outcome: Progressing   Problem: Pain Managment: Goal: General experience of comfort will improve and/or be controlled Outcome: Progressing   Problem: Safety: Goal: Ability to remain free from injury will improve Outcome: Progressing   Problem: Skin Integrity: Goal: Risk for impaired skin integrity will decrease Outcome: Progressing

## 2023-07-17 NOTE — Progress Notes (Signed)
 PROGRESS NOTE  Jeremiah Nguyen    DOB: March 30, 1956, 68 y.o.  UJW:[id number]    Code Status: Prior   DOA: 07/12/2023   LOS: 5   Brief hospital course  Jeremiah Nguyen is a 68 year old male with a past medical history significant for COPD, alcohol dependence, and seizure disorder who presented to [facility] ED on 07/12/2023 from his group home due to altered mental status.  Per report he has been altered over the last 24 hours.  On arrival to the ED he remained somnolent and altered, oriented only to self.  Given his respiratory distress he was placed on BiPAP and eventually required intubation and levophed.  Positive for influenza A. Started on tamiflu.   07/15/23- patient passed SBT today.  He is liberated from MV on to [facility]. Remains with encephalopathy.  07/16/23- patient is improved overnight. He is on home psychiatric medications now.   07/17/23 -alertness is improving. Currently on HHFNC 40L, 50%  Assessment & Plan  Principal Problem:   Acute respiratory failure with hypoxia (HCC) Active Problems:   Malnutrition of moderate degree  Severe ACUTE Hypoxic and Hypercapnic Respiratory Failure- improving. S/p intubation. Currently HHFNC 40L, 50% FiO2 -continue full scope of therapy - antibiotics , IS, nebulizer therapy  - continue IV steroids   SEPTIC shock-PRESENT ON ADMISSION - NOW RESOLVED SOURCE-flu, pneumonia. Blood cultures negative.  Completed tamiflu     ACUTE KIDNEY INJURY/Renal Failure- resolved -continue Foley Catheter-assess need -Avoid nephrotoxic agents -Follow urine output, BMP - void trial once more alert   H/o seizures-  Continue home meds when able, lamictal  HTN  HFrEF-  - continue home metoprolol   Schizopgrenia - continue chloromazine - continue sertraline     Body mass index is 22.43 kg/m.  VTE ppx: enoxaparin (LOVENOX) injection 40 mg Start: 07/14/23 2200   Diet:     Diet   Diet regular Room service appropriate? Yes; Fluid consistency: Thin    Consultants: CCM  Subjective 07/17/23    Pt reports feeling well. Denies concerns.    Objective   Vitals:   07/17/23 0300 07/17/23 0500 07/17/23 0700 07/17/23 0730  BP: 102/81  138/85 (!) 146/88  Pulse: 91  92 93  Resp: (!) 22  19 (!) 25  Temp:  98.8 F (37.1 C)  98.2 F (36.8 C)  TempSrc:  Oral  Oral  SpO2: 90%  93% 93%  Weight:  66.9 kg    Height:        Intake/Output Summary (Last 24 hours) at 07/17/2023 0826 Last data filed at 07/17/2023 0320 Gross per 24 hour  Intake 1231.55 ml  Output 2050 ml  Net -818.45 ml   Filed Weights   07/15/23 0500 07/16/23 0422 07/17/23 0500  Weight: 68.6 kg 66.9 kg 66.9 kg    Physical Exam:  General: awake, alert, NAD HEENT: significant nasal discharge around oxygen tubing MMM, hearing grossly normal Respiratory: normal respiratory effort. Shallow breathing Cardiovascular: quick capillary refill, normal S1/S2, RRR, no JVD, murmurs Gastrointestinal: soft, NT, ND Nervous: Alert, able to follow commands. Minimally conversant Extremities: moves all equally, no edema, normal tone Skin: dry, intact, normal temperature, normal color. No rashes, lesions or ulcers on exposed skin Psychiatry: flat mood, congruent affect  Labs   I have personally reviewed the following labs and imaging studies CBC    Component Value Date/Time   WBC 8.9 07/17/2023 0457   RBC 4.52 07/17/2023 0457   HGB 13.8 07/17/2023 0457   HGB 13.8 04/02/2023 1005  HGB 14.3 12/08/2013 1530   HCT 42.6 07/17/2023 0457   HCT 44.5 12/08/2013 1530   PLT 147 (L) 07/17/2023 0457   PLT 213 04/02/2023 1005   PLT 237 12/08/2013 1530   MCV 94.2 07/17/2023 0457   MCV 94 12/08/2013 1530   MCH 30.5 07/17/2023 0457   MCHC 32.4 07/17/2023 0457   RDW 14.6 07/17/2023 0457   RDW 14.8 (H) 12/08/2013 1530   LYMPHSABS 0.4 (L) 07/12/2023 0812   LYMPHSABS 1.4 12/08/2013 1530   MONOABS 0.5 07/12/2023 0812   MONOABS 0.5 12/08/2013 1530   EOSABS 0.3 07/12/2023 0812   EOSABS 0.3  12/08/2013 1530   BASOSABS 0.1 07/12/2023 0812   BASOSABS 0.1 12/08/2013 1530      Latest Ref Rng & Units 07/17/2023    4:57 AM 07/16/2023    4:15 AM 07/15/2023    4:46 AM  BMP  Glucose 70 - 99 mg/dL 93  72  657   BUN 8 - 23 mg/dL 21  29  33   Creatinine 0.61 - 1.24 mg/dL 8.46  9.62  9.52   Sodium 135 - 145 mmol/L 145  152  148   Potassium 3.5 - 5.1 mmol/L 3.7  4.2  4.8   Chloride 98 - 111 mmol/L 105  109  110   CO2 22 - 32 mmol/L 32  32  31   Calcium 8.9 - 10.3 mg/dL 9.1  9.6  8.9     No results found.  Disposition Plan & Communication  Patient status: Inpatient  Admitted From: Group home Planned disposition location: Group home Anticipated discharge date: TBD pending respiratory improvement   Family Communication: none at bedside    Author: Leeroy Bock, DO [facility] 07/17/2023, 8:26 AM   Available by Epic secure chat 7AM-7PM. If 7PM-7AM, please contact night-coverage.  [facility] contact information found on [email or web address].

## 2023-07-18 DIAGNOSIS — A419 Sepsis, unspecified organism: Secondary | ICD-10-CM | POA: Diagnosis not present

## 2023-07-18 DIAGNOSIS — J9601 Acute respiratory failure with hypoxia: Secondary | ICD-10-CM | POA: Diagnosis not present

## 2023-07-18 DIAGNOSIS — R6521 Severe sepsis with septic shock: Secondary | ICD-10-CM | POA: Diagnosis not present

## 2023-07-18 LAB — GLUCOSE, CAPILLARY
Glucose-Capillary: 104 mg/dL — ABNORMAL HIGH (ref 70–99)
Glucose-Capillary: 119 mg/dL — ABNORMAL HIGH (ref 70–99)
Glucose-Capillary: 211 mg/dL — ABNORMAL HIGH (ref 70–99)
Glucose-Capillary: 144 mg/dL — ABNORMAL HIGH (ref 70–99)

## 2023-07-18 LAB — MAGNESIUM: Magnesium: 2.1 mg/dL (ref 1.7–2.4)

## 2023-07-18 MED ORDER — METOPROLOL SUCCINATE ER 50 MG PO TB24
25.0000 mg | ORAL_TABLET | Freq: Once | ORAL | Status: AC
  Administered 2023-07-18: 25 mg via ORAL
  Filled 2023-07-18: qty 1

## 2023-07-18 MED ORDER — PSEUDOEPHEDRINE HCL 30 MG PO TABS
60.0000 mg | ORAL_TABLET | Freq: Four times a day (QID) | ORAL | Status: AC
  Administered 2023-07-18 (×3): 60 mg via ORAL
  Filled 2023-07-18 (×3): qty 2

## 2023-07-18 MED ORDER — METOPROLOL SUCCINATE ER 50 MG PO TB24
50.0000 mg | ORAL_TABLET | Freq: Every day | ORAL | Status: DC
  Administered 2023-07-19 – 2023-07-23 (×5): 50 mg via ORAL
  Filled 2023-07-18 (×5): qty 1

## 2023-07-18 MED ORDER — BUDESONIDE 0.5 MG/2ML IN SUSP
0.5000 mg | Freq: Two times a day (BID) | RESPIRATORY_TRACT | Status: AC
  Administered 2023-07-18 – 2023-07-20 (×5): 0.5 mg via RESPIRATORY_TRACT
  Filled 2023-07-18 (×5): qty 2

## 2023-07-18 MED ORDER — METOPROLOL SUCCINATE ER 50 MG PO TB24: 25.0000 mg | ORAL_TABLET | Freq: Every day | ORAL | Status: DC

## 2023-07-18 MED ORDER — IPRATROPIUM-ALBUTEROL 0.5-2.5 (3) MG/3ML IN SOLN
3.0000 mL | Freq: Four times a day (QID) | RESPIRATORY_TRACT | Status: AC
  Administered 2023-07-18 – 2023-07-19 (×4): 3 mL via RESPIRATORY_TRACT
  Filled 2023-07-18 (×4): qty 3

## 2023-07-18 NOTE — Evaluation (Signed)
 Physical Therapy Evaluation Patient Details Name: Jeremiah Nguyen MRN: [id number] DOB: 1955-11-03 Today's Date: 07/18/2023  History of Present Illness  Patient is a 68 year old male with history of COPD, alcohol dependence, and seizure disorder who presented on 07/12/2023 from group home due to altered mental status. Positive for influenza A, required mechanical ventilation, now on HHFNC.  Clinical Impression  Patient is confused with limited participation with therapy session. He reports he goes by "[name]" and is able to state his name and birth date. Otherwise disoriented, with difficulty following single step commands. He reports he is independent with walking prior to arrival but no family in the room to confirm.  Today the patient required total assistance with bed mobility. Lateral scooting transfer performed to the right with total assistance. No significant change in vitals with mobility on 40L HHFNC. Recommend to continue PT to maximize independence and facilitate return to prior level of function. Anticipate patient will require rehabilitation < 3 hours/day after this hospital stay.       If plan is discharge home, recommend the following: Two people to help with walking and/or transfers;A lot of help with bathing/dressing/bathroom;Assist for transportation;Help with stairs or ramp for entrance;Supervision due to cognitive status;Assistance with cooking/housework;Assistance with feeding;Direct supervision/assist for medications management;Direct supervision/assist for financial management   Can travel by private vehicle   No    Equipment Recommendations Other (comment) (to be determined at next level of care)  Recommendations for Other Services       Functional Status Assessment Patient has had a recent decline in their functional status and demonstrates the ability to make significant improvements in function in a reasonable and predictable amount of time.     Precautions /  Restrictions Precautions Precautions: Fall Restrictions Weight Bearing Restrictions Per Provider Order: No      Mobility  Bed Mobility Overal bed mobility: Needs Assistance Bed Mobility: Supine to Sit, Sit to Supine, Rolling Rolling: Max assist   Supine to sit: Total assist, +2 for physical assistance Sit to supine: Total assist, +2 for physical assistance   General bed mobility comments: patient does not participate with sitting up on edge of bed despite multimodal cues, increased time, and encourgaement .    Transfers Overall transfer level: Needs assistance   Transfers: Bed to chair/wheelchair/BSC            Lateral/Scoot Transfers: Total assist, +2 physical assistance General transfer comment: total assistance for lateral scooting to the right.    Ambulation/Gait                  Stairs            Wheelchair Mobility     Tilt Bed    Modified Rankin (Stroke Patients Only)       Balance Overall balance assessment: Needs assistance Sitting-balance support: Feet supported Sitting balance-Leahy Scale: Poor Sitting balance - Comments: initial external support required to maintain sitting balance. occasional posterior lean Postural control: Posterior lean                                   Pertinent Vitals/Pain Pain Assessment Pain Assessment: No/denies pain    Home Living Family/patient expects to be discharged to:: Group home Living Arrangements: Group Home                 Additional Comments: patient is a poor historian    Prior Function Prior  Level of Function : Independent/Modified Independent;Patient poor historian/Family not available             Mobility Comments: patient reports he is independent without assistance and no DME. no family in room to confirm       Extremity/Trunk Assessment   Upper Extremity Assessment Upper Extremity Assessment: Generalized weakness    Lower Extremity  Assessment Lower Extremity Assessment: Generalized weakness       Communication   Communication Communication: No apparent difficulties    Cognition Arousal: Alert Behavior During Therapy: Flat affect   PT - Cognitive impairments: No family/caregiver present to determine baseline, Memory, Orientation, Sequencing, Safety/Judgement   Orientation impairments: Place, Time, Situation                     Following commands: Impaired Following commands impaired: Follows one step commands inconsistently     Cueing Cueing Techniques: Verbal cues, Tactile cues, Visual cues     General Comments General comments (skin integrity, edema, etc.): vitals stable with mobility on 40L HHFNC    Exercises     Assessment/Plan    PT Assessment Patient needs continued PT services  PT Problem List Decreased strength;Decreased range of motion;Decreased activity tolerance;Decreased balance;Decreased mobility;Decreased safety awareness;Decreased cognition;Cardiopulmonary status limiting activity       PT Treatment Interventions DME instruction;Gait training;Stair training;Functional mobility training;Therapeutic activities;Therapeutic exercise;Balance training;Neuromuscular re-education;Cognitive remediation;Patient/family education;Wheelchair mobility training    PT Goals (Current goals can be found in the Care Plan section)  Acute Rehab PT Goals Patient Stated Goal: unable to participate in goal setting PT Goal Formulation: Patient unable to participate in goal setting Time For Goal Achievement: 08/01/23 Potential to Achieve Goals: Fair    Frequency Min 1X/week     Co-evaluation PT/OT/SLP Co-Evaluation/Treatment: Yes Reason for Co-Treatment: To address functional/ADL transfers PT goals addressed during session: Mobility/safety with mobility         AM-PAC PT "6 Clicks" Mobility  Outcome Measure Help needed turning from your back to your side while in a flat bed without using  bedrails?: A Lot Help needed moving from lying on your back to sitting on the side of a flat bed without using bedrails?: Total Help needed moving to and from a bed to a chair (including a wheelchair)?: Total Help needed standing up from a chair using your arms (e.g., wheelchair or bedside chair)?: Total Help needed to walk in hospital room?: Total Help needed climbing 3-5 steps with a railing? : Total 6 Click Score: 7    End of Session Equipment Utilized During Treatment: Oxygen Activity Tolerance: Patient limited by fatigue Patient left: in bed;with call bell/phone within reach;with nursing/sitter in room   PT Visit Diagnosis: Muscle weakness (generalized) (M62.81);Unsteadiness on feet (R26.81)    Time: 1610-9604 PT Time Calculation (min) (ACUTE ONLY): 17 min   Charges:   PT Evaluation $PT Eval High Complexity: 1 High   PT General Charges $$ ACUTE PT VISIT: 1 Visit        Donna Bernard, PT, MPT   [name] 07/18/2023, 2:05 PM

## 2023-07-18 NOTE — Progress Notes (Signed)
 Report attempted x2

## 2023-07-18 NOTE — Evaluation (Signed)
 Occupational Therapy Evaluation Patient Details Name: Jeremiah Nguyen MRN: [id number] DOB: 1955/05/31 Today's Date: 07/18/2023   History of Present Illness   Patient is a 68 year old male with history of COPD, alcohol dependence, and seizure disorder who presented on 07/12/2023 from group home due to altered mental status. Positive for influenza A, required mechanical ventilation, now on HHFNC.    Clinical Impressions Mr Nam was seen for OT evaluation this date. Prior to hospital admission, pt was IND at group home however pt poor historian. Pt currently requires TOTAL A x2 sup<>sit, fluctuating CGA - MOD A static sitting. TOTAL A x2 lateral scoot along EOB. MAX A rolling bed level for bed pan placement and pericare. Pt would benefit from skilled OT to address noted impairments and functional limitations (see below for any additional details). Upon hospital discharge, recommend OT follow up <3 hours/day.    If plan is discharge home, recommend the following:   Two people to help with bathing/dressing/bathroom;Two people to help with walking and/or transfers;Help with stairs or ramp for entrance     Functional Status Assessment   Patient has had a recent decline in their functional status and demonstrates the ability to make significant improvements in function in a reasonable and predictable amount of time.     Equipment Recommendations   Other (comment) (defer)     Recommendations for Other Services         Precautions/Restrictions   Precautions Precautions: Fall Restrictions Weight Bearing Restrictions Per Provider Order: No     Mobility Bed Mobility Overal bed mobility: Needs Assistance Bed Mobility: Supine to Sit, Sit to Supine, Rolling Rolling: Max assist   Supine to sit: Total assist, +2 for physical assistance Sit to supine: Total assist, +2 for physical assistance   General bed mobility comments: patient does not participate with sitting up on edge of bed  despite multimodal cues, increased time, and encourgaement .    Transfers Overall transfer level: Needs assistance   Transfers: Bed to chair/wheelchair/BSC            Lateral/Scoot Transfers: Total assist, +2 physical assistance General transfer comment: total assistance for lateral scooting to the right.      Balance Overall balance assessment: Needs assistance Sitting-balance support: Feet supported Sitting balance-Leahy Scale: Poor Sitting balance - Comments: CGA - MOD A stataiac sitting Postural control: Posterior lean                                 ADL either performed or assessed with clinical judgement   ADL Overall ADL's : Needs assistance/impaired                                       General ADL Comments: MAX A rolling bed level for bed pan placement and pericare.      Pertinent Vitals/Pain Pain Assessment Pain Assessment: Faces Faces Pain Scale: No hurt     Extremity/Trunk Assessment Upper Extremity Assessment Upper Extremity Assessment: Difficult to assess due to impaired cognition   Lower Extremity Assessment Lower Extremity Assessment: Difficult to assess due to impaired cognition       Communication Communication Communication: No apparent difficulties   Cognition Arousal: Alert Behavior During Therapy: Flat affect Cognition: Cognition impaired, History of cognitive impairments   Orientation impairments: Place, Time, Situation  Following commands: Impaired Following commands impaired: Follows one step commands inconsistently     Cueing  General Comments   Cueing Techniques: Verbal cues;Tactile cues;Visual cues      Exercises     Shoulder Instructions      Home Living Family/patient expects to be discharged to:: Group home Living Arrangements: Group Home                               Additional Comments: patient is a poor historian      Prior  Functioning/Environment Prior Level of Function : Independent/Modified Independent;Patient poor historian/Family not available             Mobility Comments: patient reports he is independent without assistance and no DME. no family in room to confirm      OT Problem List: Decreased strength;Decreased range of motion;Decreased activity tolerance;Impaired balance (sitting and/or standing);Decreased safety awareness;Decreased cognition   OT Treatment/Interventions: Self-care/ADL training;Therapeutic exercise;Energy conservation;DME and/or AE instruction;Therapeutic activities;Patient/family education      OT Goals(Current goals can be found in the care plan section)   Acute Rehab OT Goals Patient Stated Goal: to be left alone OT Goal Formulation: With patient Time For Goal Achievement: 08/01/23 Potential to Achieve Goals: Fair ADL Goals Pt Will Perform Grooming: with supervision;sitting Pt Will Perform Lower Body Dressing: with min assist;sitting/lateral leans Pt Will Transfer to Toilet: with mod assist;squat pivot transfer;bedside commode   OT Frequency:  Min 1X/week    Co-evaluation PT/OT/SLP Co-Evaluation/Treatment: Yes Reason for Co-Treatment: To address functional/ADL transfers PT goals addressed during session: Mobility/safety with mobility OT goals addressed during session: ADL's and self-care      AM-PAC OT "6 Clicks" Daily Activity     Outcome Measure Help from another person eating meals?: A Lot Help from another person taking care of personal grooming?: A Lot Help from another person toileting, which includes using toliet, bedpan, or urinal?: A Lot Help from another person bathing (including washing, rinsing, drying)?: Total Help from another person to put on and taking off regular upper body clothing?: A Lot Help from another person to put on and taking off regular lower body clothing?: Total 6 Click Score: 10   End of Session    Activity Tolerance:  Patient tolerated treatment well Patient left: in bed;with call bell/phone within reach;with nursing/sitter in room  OT Visit Diagnosis: Other abnormalities of gait and mobility (R26.89);Muscle weakness (generalized) (M62.81)                Time: 1337-1350 OT Time Calculation (min): 13 min Charges:  OT General Charges $OT Visit: 1 Visit OT Evaluation $OT Eval Low Complexity: 1 Low  Kathie Dike, M.S. OTR/L  07/18/23, 2:58 PM  ascom [phone]

## 2023-07-18 NOTE — Progress Notes (Signed)
 Report attempted x 1

## 2023-07-18 NOTE — Progress Notes (Signed)
 PROGRESS NOTE  Jeremiah Nguyen    DOB: 09/20/1955, 68 y.o.  ZOX:[id number]    Code Status: Prior   DOA: 07/12/2023   LOS: 6   Brief hospital course  Jeremiah Nguyen is a 68 year old male with a past medical history significant for COPD, alcohol dependence, and seizure disorder who presented to [facility] ED on 07/12/2023 from his group home due to altered mental status.  Per report he has been altered over the last 24 hours.  On arrival to the ED he remained somnolent and altered, oriented only to self.  Given his respiratory distress he was placed on BiPAP and eventually required intubation and levophed.  Positive for influenza A. Started on tamiflu.   07/15/23- patient passed SBT today.  He is liberated from MV on to [facility]. Remains with encephalopathy.  07/16/23- patient is improved overnight. He is on home psychiatric medications now.   07/18/23 -alertness is improving. Currently on HHFNC 40L, 45%  Assessment & Plan  Principal Problem:   Acute respiratory failure with hypoxia (HCC) Active Problems:   Malnutrition of moderate degree  Severe ACUTE Hypoxic and Hypercapnic Respiratory Failure- improving. S/p intubation. Currently HHFNC 40L, 50% FiO2 -continue full scope of therapy - antibiotics , IS, nebulizer therapy  - continue IV steroids - ICS - wean oxygen as tolerated - mucolytics    SEPTIC shock-PRESENT ON ADMISSION - NOW RESOLVED SOURCE-flu, pneumonia. Blood cultures negative. Hemodynamically stable Completed tamiflu     ACUTE KIDNEY INJURY/Renal Failure- resolved Foley removed -Avoid nephrotoxic agents -Follow urine output, BMP  H/o seizures-  Continue home meds, lamictal  HTN  HFrEF-  - continue home metoprolol   Schizophrenia - continue chloromazine - continue sertraline   Disposition - TOC consulted - PT/OT   Body mass index is 21.96 kg/m.  VTE ppx: enoxaparin (LOVENOX) injection 40 mg Start: 07/14/23 2200  Diet:     Diet   Diet regular Room service  appropriate? Yes; Fluid consistency: Thin   Consultants: CCM  Subjective 07/18/23    Pt reports feeling well. Denies concerns.    Objective   Vitals:   07/18/23 0500 07/18/23 0600 07/18/23 0700 07/18/23 0737  BP: 127/74 (!) 134/93 134/86   Pulse: 89 (!) 102 (!) 105   Resp: (!) 26 (!) 34 18   Temp:      TempSrc:      SpO2: 94% 92% 92% 91%  Weight: 65.5 kg     Height:        Intake/Output Summary (Last 24 hours) at 07/18/2023 0823 Last data filed at 07/18/2023 0600 Gross per 24 hour  Intake 720 ml  Output 2150 ml  Net -1430 ml   Filed Weights   07/16/23 0422 07/17/23 0500 07/18/23 0500  Weight: 66.9 kg 66.9 kg 65.5 kg    Physical Exam:  General: awake, alert, NAD HEENT: significant nasal discharge around oxygen tubing MMM, hearing grossly normal Respiratory: normal respiratory effort. Shallow breathing Cardiovascular: quick capillary refill, normal S1/S2, RRR, no JVD, murmurs Gastrointestinal: soft, NT, ND Nervous: Alert, able to follow commands. Minimally conversant Extremities: moves all equally, no edema, normal tone Skin: dry, intact, normal temperature, normal color. No rashes, lesions or ulcers on exposed skin Psychiatry: flat mood, congruent affect  Labs   I have personally reviewed the following labs and imaging studies CBC    Component Value Date/Time   WBC 8.9 07/17/2023 0457   RBC 4.52 07/17/2023 0457   HGB 13.8 07/17/2023 0457   HGB 13.8 04/02/2023 1005  HGB 14.3 12/08/2013 1530   HCT 42.6 07/17/2023 0457   HCT 44.5 12/08/2013 1530   PLT 147 (L) 07/17/2023 0457   PLT 213 04/02/2023 1005   PLT 237 12/08/2013 1530   MCV 94.2 07/17/2023 0457   MCV 94 12/08/2013 1530   MCH 30.5 07/17/2023 0457   MCHC 32.4 07/17/2023 0457   RDW 14.6 07/17/2023 0457   RDW 14.8 (H) 12/08/2013 1530   LYMPHSABS 0.4 (L) 07/12/2023 0812   LYMPHSABS 1.4 12/08/2013 1530   MONOABS 0.5 07/12/2023 0812   MONOABS 0.5 12/08/2013 1530   EOSABS 0.3 07/12/2023 0812   EOSABS  0.3 12/08/2013 1530   BASOSABS 0.1 07/12/2023 0812   BASOSABS 0.1 12/08/2013 1530      Latest Ref Rng & Units 07/17/2023    4:57 AM 07/16/2023    4:15 AM 07/15/2023    4:46 AM  BMP  Glucose 70 - 99 mg/dL 93  72  132   BUN 8 - 23 mg/dL 21  29  33   Creatinine 0.61 - 1.24 mg/dL 4.40  1.02  7.25   Sodium 135 - 145 mmol/L 145  152  148   Potassium 3.5 - 5.1 mmol/L 3.7  4.2  4.8   Chloride 98 - 111 mmol/L 105  109  110   CO2 22 - 32 mmol/L 32  32  31   Calcium 8.9 - 10.3 mg/dL 9.1  9.6  8.9     No results found.  Disposition Plan & Communication  Patient status: Inpatient  Admitted From: Group home Planned disposition location: Group home Anticipated discharge date: TBD pending respiratory improvement   Family Communication: none at bedside    Author: Leeroy Bock, DO [facility] 07/18/2023, 8:23 AM   Available by Epic secure chat 7AM-7PM. If 7PM-7AM, please contact night-coverage.  [facility] contact information found on [email or web address].

## 2023-07-19 DIAGNOSIS — R6521 Severe sepsis with septic shock: Secondary | ICD-10-CM | POA: Diagnosis not present

## 2023-07-19 DIAGNOSIS — A419 Sepsis, unspecified organism: Secondary | ICD-10-CM | POA: Diagnosis not present

## 2023-07-19 DIAGNOSIS — J9601 Acute respiratory failure with hypoxia: Secondary | ICD-10-CM | POA: Diagnosis not present

## 2023-07-19 LAB — MAGNESIUM: Magnesium: 2.3 mg/dL (ref 1.7–2.4)

## 2023-07-19 LAB — GLUCOSE, CAPILLARY
Glucose-Capillary: 150 mg/dL — ABNORMAL HIGH (ref 70–99)
Glucose-Capillary: 190 mg/dL — ABNORMAL HIGH (ref 70–99)

## 2023-07-19 NOTE — Plan of Care (Signed)
  Problem: Activity: Goal: Ability to tolerate increased activity will improve Outcome: Progressing   Problem: Respiratory: Goal: Ability to maintain a clear airway and adequate ventilation will improve Outcome: Progressing   Problem: Role Relationship: Goal: Method of communication will improve Outcome: Progressing   Problem: Education: Goal: Knowledge of General Education information will improve Description: Including pain rating scale, medication(s)/side effects and non-pharmacologic comfort measures Outcome: Progressing   Problem: Health Behavior/Discharge Planning: Goal: Ability to manage health-related needs will improve Outcome: Progressing   Problem: Clinical Measurements: Goal: Ability to maintain clinical measurements within normal limits will improve Outcome: Progressing Goal: Will remain free from infection Outcome: Progressing Goal: Diagnostic test results will improve Outcome: Progressing Goal: Respiratory complications will improve Outcome: Progressing Goal: Cardiovascular complication will be avoided Outcome: Progressing   Problem: Nutrition: Goal: Adequate nutrition will be maintained Outcome: Progressing   Problem: Elimination: Goal: Will not experience complications related to bowel motility Outcome: Progressing Goal: Will not experience complications related to urinary retention Outcome: Progressing

## 2023-07-19 NOTE — Plan of Care (Signed)
  Problem: Activity: Goal: Ability to tolerate increased activity will improve Outcome: Progressing   Problem: Respiratory: Goal: Ability to maintain a clear airway and adequate ventilation will improve Outcome: Progressing   Problem: Role Relationship: Goal: Method of communication will improve Outcome: Progressing   Problem: Education: Goal: Knowledge of General Education information will improve Description: Including pain rating scale, medication(s)/side effects and non-pharmacologic comfort measures Outcome: Progressing   Problem: Health Behavior/Discharge Planning: Goal: Ability to manage health-related needs will improve Outcome: Progressing   Problem: Clinical Measurements: Goal: Ability to maintain clinical measurements within normal limits will improve Outcome: Progressing Goal: Will remain free from infection Outcome: Progressing Goal: Diagnostic test results will improve Outcome: Progressing Goal: Respiratory complications will improve Outcome: Progressing Goal: Cardiovascular complication will be avoided Outcome: Progressing   Problem: Activity: Goal: Risk for activity intolerance will decrease Outcome: Progressing   Problem: Nutrition: Goal: Adequate nutrition will be maintained Outcome: Progressing   Problem: Coping: Goal: Level of anxiety will decrease Outcome: Progressing   Problem: Elimination: Goal: Will not experience complications related to bowel motility Outcome: Progressing Goal: Will not experience complications related to urinary retention Outcome: Progressing   Problem: Pain Managment: Goal: General experience of comfort will improve and/or be controlled Outcome: Progressing   Problem: Safety: Goal: Ability to remain free from injury will improve Outcome: Progressing   Problem: Skin Integrity: Goal: Risk for impaired skin integrity will decrease Outcome: Progressing   Problem: Education: Goal: Ability to describe self-care  measures that may prevent or decrease complications (Diabetes Survival Skills Education) will improve Outcome: Progressing Goal: Individualized Educational Video(s) Outcome: Progressing   Problem: Coping: Goal: Ability to adjust to condition or change in health will improve Outcome: Progressing   Problem: Fluid Volume: Goal: Ability to maintain a balanced intake and output will improve Outcome: Progressing   Problem: Health Behavior/Discharge Planning: Goal: Ability to identify and utilize available resources and services will improve Outcome: Progressing Goal: Ability to manage health-related needs will improve Outcome: Progressing   Problem: Metabolic: Goal: Ability to maintain appropriate glucose levels will improve Outcome: Progressing   Problem: Nutritional: Goal: Maintenance of adequate nutrition will improve Outcome: Progressing Goal: Progress toward achieving an optimal weight will improve Outcome: Progressing   Problem: Skin Integrity: Goal: Risk for impaired skin integrity will decrease Outcome: Progressing   Problem: Tissue Perfusion: Goal: Adequacy of tissue perfusion will improve Outcome: Progressing

## 2023-07-19 NOTE — Progress Notes (Signed)
 Occupational Therapy Treatment Patient Details Name: Jeremiah Nguyen MRN: [id number] DOB: 11/17/1955 Today's Date: 07/19/2023   History of present illness Patient is a 68 year old male with history of COPD, alcohol dependence, and seizure disorder who presented on 07/12/2023 from group home due to altered mental status. Positive for influenza A, required mechanical ventilation, now on HHFNC.   OT comments  Pt seen for OT session focused on grooming tasks and following simple commands. Pt oriented to person. Pt initially resistant to grooming task, but with encouragement able to initiate washing his face with VC but required MOD A to complete the task as he only wiped his mouth. Pt fixated on desire to have the blankets removed from his legs and then removed entirely from the bed. Pt requested OT remove his socks requiring MAX A, pt attempting minimally to lift his legs to assist. Pt then attempted to remove his hospital gown requiring multimodal cues to redirect briefly. Pt demonstrating poor to no immediate recall when educated in call bell. Pt limited by cognition this date, however able to more appropriately verbalize wants/needs. Pt continues to benefit from skilled OT services.       If plan is discharge home, recommend the following:  Two people to help with bathing/dressing/bathroom;Two people to help with walking and/or transfers;Help with stairs or ramp for entrance   Equipment Recommendations  Other (comment) (defer)    Recommendations for Other Services      Precautions / Restrictions Precautions Precautions: Fall Restrictions Weight Bearing Restrictions Per Provider Order: No       Mobility Bed Mobility Overal bed mobility: Needs Assistance             General bed mobility comments: MAX A for repositioning in bed for improved midline posture    Transfers                         Balance                                           ADL  either performed or assessed with clinical judgement   ADL Overall ADL's : Needs assistance/impaired     Grooming: Bed level;Wash/dry face;Moderate assistance Grooming Details (indicate cue type and reason): Pt required cues to initiate/participate, he took the washcloth, washed his mouth, and gave back to the therapist. MOD A to complete.                                    Extremity/Trunk Assessment              Occupational psychologist Communication: Impaired Factors Affecting Communication: Difficulty expressing self   Cognition Arousal: Alert Behavior During Therapy: Restless Cognition: Cognition impaired, History of cognitive impairments   Orientation impairments: Place, Time, Situation Awareness: Intellectual awareness impaired, Online awareness impaired Memory impairment (select all impairments): Short-term memory, Working [employer] fast streamer, Conservation officer, historic buildings, Non-declarative long-term memory Attention impairment (select first level of impairment): Focused attention, Sustained attention, Selective attention, Alternating attention, Divided attention Executive functioning impairment (select all impairments): Initiation, Organization, Sequencing, Reasoning, Problem solving  Following commands: Impaired Following commands impaired: Follows one step commands inconsistently      Cueing   Cueing Techniques: Verbal cues, Tactile cues, Visual cues  Exercises Other Exercises Other Exercises: Pt fixated on having the blankets taken off his legs, then removed from the bed entirely. Pt then attempting to remove his hospital gown requiring multimodal cues to redirect briefly. Pt demonstrating poor to no immediate recall when educated in call bell.    Shoulder Instructions       General Comments patient appers to have limited awareness of deficits    Pertinent Vitals/ Pain       Pain  Assessment Pain Assessment: No/denies pain  Home Living                                          Prior Functioning/Environment              Frequency  Min 1X/week        Progress Toward Goals  OT Goals(current goals can now be found in the care plan section)  Progress towards OT goals: OT to reassess next treatment  Acute Rehab OT Goals Patient Stated Goal: to be left alone OT Goal Formulation: With patient Time For Goal Achievement: 08/01/23 Potential to Achieve Goals: Fair  Plan      Co-evaluation                 AM-PAC OT "6 Clicks" Daily Activity     Outcome Measure   Help from another person eating meals?: A Lot Help from another person taking care of personal grooming?: A Lot Help from another person toileting, which includes using toliet, bedpan, or urinal?: A Lot Help from another person bathing (including washing, rinsing, drying)?: Total Help from another person to put on and taking off regular upper body clothing?: A Lot Help from another person to put on and taking off regular lower body clothing?: Total 6 Click Score: 10    End of Session    OT Visit Diagnosis: Other abnormalities of gait and mobility (R26.89);Muscle weakness (generalized) (M62.81)   Activity Tolerance Other (comment) (limited by cognition)   Patient Left in bed;with call bell/phone within reach;with bed alarm set   Nurse Communication          Time: 2956-2130 OT Time Calculation (min): 10 min  Charges: OT General Charges $OT Visit: 1 Visit OT Treatments $Self Care/Home Management : 8-22 mins  Arman Filter., MPH, MS, OTR/L ascom [phone] 07/19/23, 2:36 PM

## 2023-07-19 NOTE — Progress Notes (Signed)
 Physical Therapy Treatment Patient Details Name: Jeremiah Nguyen MRN: [id number] DOB: Dec 01, 1955 Today's Date: 07/19/2023   History of Present Illness Patient is a 68 year old male with history of COPD, alcohol dependence, and seizure disorder who presented on 07/12/2023 from group home due to altered mental status. Positive for influenza A, required mechanical ventilation, now on HHFNC.    PT Comments  Patient with increased participation with mobility efforts today. He has decreased awareness of deficits. Mod A required for sitting balance with poor sitting balance. Several attempts made to stand today, however unable to fully clear hips despite maximal effort. Anticipate 2 person assistance will be required for standing. Recommend to continue PT to maximize independence and facilitate return to prior level of function. Rehabilitation < 3 hours/day recommended after this hospital stay.    If plan is discharge home, recommend the following: Two people to help with walking and/or transfers;A lot of help with bathing/dressing/bathroom;Assist for transportation;Help with stairs or ramp for entrance;Supervision due to cognitive status;Assistance with cooking/housework;Assistance with feeding;Direct supervision/assist for medications management;Direct supervision/assist for financial management   Can travel by private vehicle     No  Equipment Recommendations  Other (comment) (to be determined)    Recommendations for Other Services       Precautions / Restrictions Precautions Precautions: Fall Restrictions Weight Bearing Restrictions Per Provider Order: No     Mobility  Bed Mobility Overal bed mobility: Needs Assistance Bed Mobility: Supine to Sit, Sit to Supine     Supine to sit: Mod assist Sit to supine: Max assist   General bed mobility comments: assistance for LE and trunk support. cues for sequencing    Transfers Overall transfer level: Needs assistance   Transfers: Sit  to/from Stand Sit to Stand: Total assist           General transfer comment: cues for task initiation, anterior weight shifting. several attempts made for standing. unable to clear hips fully from bed despite maximal effort. anticipate +2 person assistance will be needed to stand    Ambulation/Gait                   Stairs             Wheelchair Mobility     Tilt Bed    Modified Rankin (Stroke Patients Only)       Balance Overall balance assessment: Needs assistance Sitting-balance support: Feet supported Sitting balance-Leahy Scale: Poor                                      Communication Communication Communication: Impaired Factors Affecting Communication: Difficulty expressing self  Cognition Arousal: Alert Behavior During Therapy: Flat affect   PT - Cognitive impairments: No family/caregiver present to determine baseline, Memory, Orientation, Sequencing, Safety/Judgement   Orientation impairments: Place, Time, Situation                     Following commands: Impaired      Cueing Cueing Techniques: Verbal cues, Tactile cues, Visual cues  Exercises      General Comments General comments (skin integrity, edema, etc.): patient appers to have limited awareness of deficits      Pertinent Vitals/Pain Pain Assessment Pain Assessment: No/denies pain    Home Living  Prior Function            PT Goals (current goals can now be found in the care plan section) Acute Rehab PT Goals Patient Stated Goal: unable to participate in goal setting PT Goal Formulation: Patient unable to participate in goal setting Time For Goal Achievement: 08/01/23 Potential to Achieve Goals: Fair Progress towards PT goals: Progressing toward goals    Frequency    Min 1X/week      PT Plan      Co-evaluation              AM-PAC PT "6 Clicks" Mobility   Outcome Measure  Help needed  turning from your back to your side while in a flat bed without using bedrails?: A Lot Help needed moving from lying on your back to sitting on the side of a flat bed without using bedrails?: A Lot Help needed moving to and from a bed to a chair (including a wheelchair)?: Total Help needed standing up from a chair using your arms (e.g., wheelchair or bedside chair)?: Total Help needed to walk in hospital room?: Total Help needed climbing 3-5 steps with a railing? : Total 6 Click Score: 8    End of Session Equipment Utilized During Treatment: Oxygen Activity Tolerance: Patient limited by fatigue Patient left: in bed;with call bell/phone within reach;with bed alarm set   PT Visit Diagnosis: Muscle weakness (generalized) (M62.81);Unsteadiness on feet (R26.81)     Time: 1610-9604 PT Time Calculation (min) (ACUTE ONLY): 10 min  Charges:    $Therapeutic Activity: 8-22 mins PT General Charges $$ ACUTE PT VISIT: 1 Visit                     Donna Bernard, PT, MPT    [name] 07/19/2023, 12:34 PM

## 2023-07-19 NOTE — Care Management Important Message (Signed)
 Important Message  Patient Details  Name: Jeremiah Nguyen MRN: [id number] Date of Birth: 01/12/1956   Important Message Given:  Yes - Medicare IM     Cristela Blue, CMA 07/19/2023, 10:54 AM

## 2023-07-19 NOTE — Progress Notes (Signed)
 PROGRESS NOTE  Jeremiah Nguyen    DOB: 02-Aug-1955, 68 y.o.  ZOX:[id number]    Code Status: Prior   DOA: 07/12/2023   LOS: 7   Brief hospital course  Jeremiah Nguyen is a 68 year old male with a past medical history significant for COPD, alcohol dependence, and seizure disorder who presented to [facility] ED on 07/12/2023 from his group home due to altered mental status.  Per report he has been altered over the last 24 hours.  On arrival to the ED he remained somnolent and altered, oriented only to self.  Given his respiratory distress he was placed on BiPAP and eventually required intubation and levophed.  Positive for influenza A. Started on tamiflu.   07/15/23- patient passed SBT today.  He is liberated from MV on to [facility]. Remains with encephalopathy.  07/16/23- patient is improved overnight. He is on home psychiatric medications now.   07/19/23 -alertness is improving and was able to wean.  Unfortunately, he removed O2 presumably on accident and experienced some hypoxic delirium. Will need sitter and mitts in order to prevent line pulling until he can become more oriented again.   Assessment & Plan  Principal Problem:   Acute respiratory failure with hypoxia (HCC) Active Problems:   Malnutrition of moderate degree  Severe ACUTE Hypoxic and Hypercapnic Respiratory Failure- improving. S/p intubation. Currently HFNC 12L -continue full scope of therapy - antibiotics , IS, nebulizer therapy  - continue IV steroids - ICS - wean oxygen as tolerated - mucolytics   Hypoxic delirium-  Mitts and sitter to help prevent pulling of lines. If confusion continues with continuous oxygen, get ABG.    SEPTIC shock-PRESENT ON ADMISSION - NOW RESOLVED SOURCE-flu, pneumonia. Blood cultures negative. Hemodynamically stable Completed tamiflu     ACUTE KIDNEY INJURY/Renal Failure- resolved Foley removed -Avoid nephrotoxic agents -Follow urine output, BMP  H/o seizures-  Continue home meds, lamictal  HTN   HFrEF-  - continue home metoprolol   Schizophrenia - continue chloromazine - continue sertraline   Disposition - TOC consulted - PT/OT   Body mass index is 21.12 kg/m.  VTE ppx: enoxaparin (LOVENOX) injection 40 mg Start: 07/14/23 2200  Diet:     Diet   Diet regular Room service appropriate? Yes; Fluid consistency: Thin   Consultants: CCM  Subjective 07/19/23    Pt reports feeling well. Denies concerns. Denies SOB   Objective   Vitals:   07/19/23 0143 07/19/23 0433 07/19/23 0543 07/19/23 0750  BP:   131/67   Pulse:   (!) 107 (!) 112  Resp:   19 20  Temp:   98.2 F (36.8 C)   TempSrc:   Oral   SpO2: 92%  91% 90%  Weight:  63.1 kg 63 kg   Height:        Intake/Output Summary (Last 24 hours) at 07/19/2023 0800 Last data filed at 07/19/2023 0602 Gross per 24 hour  Intake 240 ml  Output 1100 ml  Net -860 ml   Filed Weights   07/18/23 0500 07/19/23 0433 07/19/23 0543  Weight: 65.5 kg 63.1 kg 63 kg    Physical Exam:  General: awake, alert, NAD HEENT: MMM, hearing grossly normal Respiratory: normal respiratory effort. Shallow breathing Cardiovascular: quick capillary refill, normal S1/S2, RRR, no JVD, murmurs Gastrointestinal: soft, NT, ND Nervous: Alert, able to follow commands. Minimally conversant Extremities: moves all equally, no edema, normal tone Skin: dry, intact, normal temperature, normal color. No rashes, lesions or ulcers on exposed skin Psychiatry: flat mood,  congruent affect  Labs   I have personally reviewed the following labs and imaging studies CBC    Component Value Date/Time   WBC 8.9 07/17/2023 0457   RBC 4.52 07/17/2023 0457   HGB 13.8 07/17/2023 0457   HGB 13.8 04/02/2023 1005   HGB 14.3 12/08/2013 1530   HCT 42.6 07/17/2023 0457   HCT 44.5 12/08/2013 1530   PLT 147 (L) 07/17/2023 0457   PLT 213 04/02/2023 1005   PLT 237 12/08/2013 1530   MCV 94.2 07/17/2023 0457   MCV 94 12/08/2013 1530   MCH 30.5 07/17/2023 0457   MCHC  32.4 07/17/2023 0457   RDW 14.6 07/17/2023 0457   RDW 14.8 (H) 12/08/2013 1530   LYMPHSABS 0.4 (L) 07/12/2023 0812   LYMPHSABS 1.4 12/08/2013 1530   MONOABS 0.5 07/12/2023 0812   MONOABS 0.5 12/08/2013 1530   EOSABS 0.3 07/12/2023 0812   EOSABS 0.3 12/08/2013 1530   BASOSABS 0.1 07/12/2023 0812   BASOSABS 0.1 12/08/2013 1530      Latest Ref Rng & Units 07/17/2023    4:57 AM 07/16/2023    4:15 AM 07/15/2023    4:46 AM  BMP  Glucose 70 - 99 mg/dL 93  72  161   BUN 8 - 23 mg/dL 21  29  33   Creatinine 0.61 - 1.24 mg/dL 0.96  0.45  4.09   Sodium 135 - 145 mmol/L 145  152  148   Potassium 3.5 - 5.1 mmol/L 3.7  4.2  4.8   Chloride 98 - 111 mmol/L 105  109  110   CO2 22 - 32 mmol/L 32  32  31   Calcium 8.9 - 10.3 mg/dL 9.1  9.6  8.9     No results found.  Disposition Plan & Communication  Patient status: Inpatient  Admitted From: Group home Planned disposition location: Group home Anticipated discharge date: TBD pending respiratory improvement   Family Communication: none at bedside    Author: Leeroy Bock, DO [facility] 07/19/2023, 8:00 AM   Available by Epic secure chat 7AM-7PM. If 7PM-7AM, please contact night-coverage.  [facility] contact information found on [email or web address].

## 2023-07-19 NOTE — Progress Notes (Signed)
 Nutrition Follow-up  DOCUMENTATION CODES:   Non-severe (moderate) malnutrition in context of chronic illness  INTERVENTION:   -Continue regular diet -Continue Ensure Enlive po TID, each supplement provides 350 kcal and 20 grams of protein -Magic cup TID with meals, each supplement provides 290 kcal and 9 grams of protein  -Continue MVI with minerals daily -Continue 1 mg folic acid daily -100 mg thiamine daily  NUTRITION DIAGNOSIS:   Moderate Malnutrition related to chronic illness as evidenced by severe fat depletion, severe muscle depletion.  Ongoing  GOAL:   Patient will meet greater than or equal to 90% of their needs  Progressing   MONITOR:   PO intake, Supplement acceptance, Labs, Weight trends, I & O's, Skin  REASON FOR ASSESSMENT:   Ventilator    ASSESSMENT:   68 y/o male with h/o seizures, etoh abuse, GERD, liver disease, OSA, COPD and resides in a group home who is admitted with acute metabolic encephalopathy, severe sepsis with septic shock, AKI, acute hypoxic and hypercapnic respiratory failure in the setting of acute COPD exacerbation due to influenza A infection and superimposed bacterial pneumonia requiring intubation and mechanical ventilation.  Reviewed I/O's: -860 ml x 24 hours and -1.1 L since admission  UOP: 1.1 L x 24 hours   Pt sitting up in bed, on HHFNC.   Pt reports feeling better today. Reports he is eating "enough" of his food, estimates consuming about 50% of meals. Noted meal completions 40-50%. Breakfast tray delivered, however, pt refused; encouraged pt to eat and offered to help set up, however, pt continuously asking to "get up out of here to smoke a cigarette". RD re-oriented pt to place and situation.   Noted pt consumed about 90% of Ensure supplement. Discussed importance of good meal and supplement intake to promote healing. Pt amenable to continue to drink supplements.   Reviewed wt hx; noted pt has experienced a 7.4% wt loss over  the past week, which is significant for time frame.  7.4  Medications reviewed and include lovenox, folic acid, solu-medrol, miralax, and thiamine.   Per MD notes, plan to discharge back to group home once respiratory status improves.   Labs reviewed: CBGS: 104-211 (inpatient orders for glycemic control are none).    Diet Order:   Diet Order             Diet regular Room service appropriate? Yes; Fluid consistency: Thin  Diet effective now                   EDUCATION NEEDS:   No education needs have been identified at this time  Skin:  Skin Assessment: Reviewed RN Assessment  Last BM:  07/18/23  Height:   Ht Readings from Last 1 Encounters:  07/12/23 5' 7.99" (1.727 m)    Weight:   Wt Readings from Last 1 Encounters:  07/19/23 63 kg    Ideal Body Weight:  70 kg  BMI:  Body mass index is 21.12 kg/m.  Estimated Nutritional Needs:   Kcal:  1900-2200kcal/day  Protein:  95-110g/day  Fluid:  1.8-2.1L/day    Levada Schilling, RD, LDN, CDCES Registered Dietitian III Certified Diabetes Care and Education Specialist If unable to reach this RD, please use "RD Inpatient" group chat on secure chat between hours of 8am-4 pm daily

## 2023-07-19 NOTE — Plan of Care (Signed)
  Problem: Activity: Goal: Ability to tolerate increased activity will improve Outcome: Not Progressing   Problem: Respiratory: Goal: Ability to maintain a clear airway and adequate ventilation will improve Outcome: Not Progressing   Problem: Education: Goal: Knowledge of General Education information will improve Description: Including pain rating scale, medication(s)/side effects and non-pharmacologic comfort measures Outcome: Not Progressing

## 2023-07-20 DIAGNOSIS — R6521 Severe sepsis with septic shock: Secondary | ICD-10-CM | POA: Diagnosis not present

## 2023-07-20 DIAGNOSIS — A419 Sepsis, unspecified organism: Secondary | ICD-10-CM | POA: Diagnosis not present

## 2023-07-20 DIAGNOSIS — J9601 Acute respiratory failure with hypoxia: Secondary | ICD-10-CM | POA: Diagnosis not present

## 2023-07-20 LAB — BASIC METABOLIC PANEL
Anion gap: 11 (ref 5–15)
BUN: 27 mg/dL — ABNORMAL HIGH (ref 8–23)
CO2: 29 mmol/L (ref 22–32)
Calcium: 9.9 mg/dL (ref 8.9–10.3)
Chloride: 108 mmol/L (ref 98–111)
Creatinine, Ser: 1.06 mg/dL (ref 0.61–1.24)
GFR, Estimated: >60 mL/min (ref >60)
Glucose, Bld: 118 mg/dL — ABNORMAL HIGH (ref 70–99)
Potassium: 4.1 mmol/L (ref 3.5–5.1)
Sodium: 148 mmol/L — ABNORMAL HIGH (ref 135–145)

## 2023-07-20 LAB — CBC
HCT: 47.8 % (ref 39.0–52.0)
Hemoglobin: 15.8 g/dL (ref 13.0–17.0)
MCH: 29.5 pg (ref 26.0–34.0)
MCHC: 33.1 g/dL (ref 30.0–36.0)
MCV: 89.2 fL (ref 80.0–100.0)
Platelets: 216 10*3/uL (ref 150–400)
RBC: 5.36 MIL/uL (ref 4.22–5.81)
RDW: 14.4 % (ref 11.5–15.5)
WBC: 6.9 10*3/uL (ref 4.0–10.5)
nRBC: 0 % (ref 0.0–0.2)

## 2023-07-20 LAB — GLUCOSE, CAPILLARY
Glucose-Capillary: 122 mg/dL — ABNORMAL HIGH (ref 70–99)
Glucose-Capillary: 229 mg/dL — ABNORMAL HIGH (ref 70–99)
Glucose-Capillary: 130 mg/dL — ABNORMAL HIGH (ref 70–99)
Glucose-Capillary: 124 mg/dL — ABNORMAL HIGH (ref 70–99)

## 2023-07-20 NOTE — Plan of Care (Signed)
 Problem: Activity: Goal: Ability to tolerate increased activity will improve 07/20/2023 2110 by Velta Addison, RN Outcome: Progressing 07/20/2023 1942 by Velta Addison, RN Outcome: Progressing   Problem: Respiratory: Goal: Ability to maintain a clear airway and adequate ventilation will improve 07/20/2023 2110 by Velta Addison, RN Outcome: Progressing 07/20/2023 1942 by Velta Addison, RN Outcome: Progressing   Problem: Role Relationship: Goal: Method of communication will improve 07/20/2023 2110 by Velta Addison, RN Outcome: Progressing 07/20/2023 1942 by Velta Addison, RN Outcome: Progressing   Problem: Education: Goal: Knowledge of General Education information will improve Description: Including pain rating scale, medication(s)/side effects and non-pharmacologic comfort measures 07/20/2023 2110 by Velta Addison, RN Outcome: Progressing 07/20/2023 1942 by Velta Addison, RN Outcome: Progressing   Problem: Health Behavior/Discharge Planning: Goal: Ability to manage health-related needs will improve 07/20/2023 2110 by Velta Addison, RN Outcome: Progressing 07/20/2023 1942 by Velta Addison, RN Outcome: Progressing   Problem: Clinical Measurements: Goal: Ability to maintain clinical measurements within normal limits will improve 07/20/2023 2110 by Velta Addison, RN Outcome: Progressing 07/20/2023 1942 by Velta Addison, RN Outcome: Progressing Goal: Will remain free from infection 07/20/2023 2110 by Velta Addison, RN Outcome: Progressing 07/20/2023 1942 by Velta Addison, RN Outcome: Progressing Goal: Diagnostic test results will improve 07/20/2023 2110 by Velta Addison, RN Outcome: Progressing 07/20/2023 1942 by Velta Addison, RN Outcome: Progressing Goal: Respiratory complications will improve 07/20/2023 2110 by Velta Addison, RN Outcome: Progressing 07/20/2023 1942 by Velta Addison, RN Outcome: Progressing Goal:  Cardiovascular complication will be avoided 07/20/2023 2110 by Velta Addison, RN Outcome: Progressing 07/20/2023 1942 by Velta Addison, RN Outcome: Progressing   Problem: Activity: Goal: Risk for activity intolerance will decrease 07/20/2023 2110 by Velta Addison, RN Outcome: Progressing 07/20/2023 1942 by Velta Addison, RN Outcome: Progressing   Problem: Nutrition: Goal: Adequate nutrition will be maintained 07/20/2023 2110 by Velta Addison, RN Outcome: Progressing 07/20/2023 1942 by Velta Addison, RN Outcome: Progressing   Problem: Coping: Goal: Level of anxiety will decrease 07/20/2023 2110 by Velta Addison, RN Outcome: Progressing 07/20/2023 1942 by Velta Addison, RN Outcome: Progressing   Problem: Elimination: Goal: Will not experience complications related to bowel motility 07/20/2023 2110 by Velta Addison, RN Outcome: Progressing 07/20/2023 1942 by Velta Addison, RN Outcome: Progressing Goal: Will not experience complications related to urinary retention 07/20/2023 2110 by Velta Addison, RN Outcome: Progressing 07/20/2023 1942 by Velta Addison, RN Outcome: Progressing   Problem: Pain Managment: Goal: General experience of comfort will improve and/or be controlled 07/20/2023 2110 by Velta Addison, RN Outcome: Progressing 07/20/2023 1942 by Velta Addison, RN Outcome: Progressing   Problem: Safety: Goal: Ability to remain free from injury will improve 07/20/2023 2110 by Velta Addison, RN Outcome: Progressing 07/20/2023 1942 by Velta Addison, RN Outcome: Progressing   Problem: Skin Integrity: Goal: Risk for impaired skin integrity will decrease 07/20/2023 2110 by Velta Addison, RN Outcome: Progressing 07/20/2023 1942 by Velta Addison, RN Outcome: Progressing   Problem: Education: Goal: Ability to describe self-care measures that may prevent or decrease complications (Diabetes Survival Skills Education) will  improve 07/20/2023 2110 by Velta Addison, RN Outcome: Progressing 07/20/2023 1942 by Velta Addison, RN Outcome: Progressing Goal: Individualized Educational Video(s) 07/20/2023 2110 by Velta Addison, RN Outcome: Progressing 07/20/2023 1942 by Velta Addison, RN Outcome: Progressing   Problem: Coping: Goal:  Ability to adjust to condition or change in health will improve 07/20/2023 2110 by Velta Addison, RN Outcome: Progressing 07/20/2023 1942 by Velta Addison, RN Outcome: Progressing   Problem: Fluid Volume: Goal: Ability to maintain a balanced intake and output will improve 07/20/2023 2110 by Velta Addison, RN Outcome: Progressing 07/20/2023 1942 by Velta Addison, RN Outcome: Progressing   Problem: Health Behavior/Discharge Planning: Goal: Ability to identify and utilize available resources and services will improve 07/20/2023 2110 by Velta Addison, RN Outcome: Progressing 07/20/2023 1942 by Velta Addison, RN Outcome: Progressing Goal: Ability to manage health-related needs will improve 07/20/2023 2110 by Velta Addison, RN Outcome: Progressing 07/20/2023 1942 by Velta Addison, RN Outcome: Progressing   Problem: Metabolic: Goal: Ability to maintain appropriate glucose levels will improve 07/20/2023 2110 by Velta Addison, RN Outcome: Progressing 07/20/2023 1942 by Velta Addison, RN Outcome: Progressing   Problem: Nutritional: Goal: Maintenance of adequate nutrition will improve 07/20/2023 2110 by Velta Addison, RN Outcome: Progressing 07/20/2023 1942 by Velta Addison, RN Outcome: Progressing Goal: Progress toward achieving an optimal weight will improve 07/20/2023 2110 by Velta Addison, RN Outcome: Progressing 07/20/2023 1942 by Velta Addison, RN Outcome: Progressing   Problem: Skin Integrity: Goal: Risk for impaired skin integrity will decrease 07/20/2023 2110 by Velta Addison, RN Outcome: Progressing 07/20/2023 1942 by  Velta Addison, RN Outcome: Progressing   Problem: Tissue Perfusion: Goal: Adequacy of tissue perfusion will improve 07/20/2023 2110 by Velta Addison, RN Outcome: Progressing 07/20/2023 1942 by Velta Addison, RN Outcome: Progressing

## 2023-07-20 NOTE — Progress Notes (Signed)
 PROGRESS NOTE  Jeremiah Nguyen    DOB: Jan 31, 1956, 68 y.o.  VFI:[id number]    Code Status: Prior   DOA: 07/12/2023   LOS: 8   Brief hospital course  Jeremiah Nguyen is a 68 year old male with a past medical history significant for COPD, alcohol dependence, and seizure disorder who presented to [facility] ED on 07/12/2023 from his group home due to altered mental status.  Per report he has been altered over the last 24 hours.  On arrival to the ED he remained somnolent and altered, oriented only to self.  Given his respiratory distress he was placed on BiPAP and eventually required intubation and levophed.  Positive for influenza A. Started on tamiflu.   07/15/23- patient passed SBT today.  He is liberated from MV on to [facility]. Remains with encephalopathy.  07/16/23- patient is improved overnight. He is on home psychiatric medications now.   07/20/23 -continue to wean  Assessment & Plan  Principal Problem:   Acute respiratory failure with hypoxia (HCC) Active Problems:   Malnutrition of moderate degree  Severe ACUTE Hypoxic and Hypercapnic Respiratory Failure- improving. S/p intubation. Currently HHFNC 45 -continue full scope of therapy - antibiotics , IS, nebulizer therapy  - continue IV steroids - ICS - wean oxygen as tolerated - mucolytics   Hypoxic delirium-  Mitts and sitter to help prevent pulling of lines. If confusion continues with continuous oxygen, get ABG.    SEPTIC shock-PRESENT ON ADMISSION - NOW RESOLVED SOURCE-flu, pneumonia. Blood cultures negative. Hemodynamically stable Completed tamiflu     ACUTE KIDNEY INJURY/Renal Failure- resolved Foley removed -Avoid nephrotoxic agents -Follow urine output, BMP  H/o seizures-  Continue home meds, lamictal  HTN  HFrEF-  - continue home metoprolol   Schizophrenia - continue chloromazine - continue sertraline   Disposition - TOC consulted - PT/OT   Body mass index is 19.75 kg/m.  VTE ppx: enoxaparin (LOVENOX)  injection 40 mg Start: 07/14/23 2200  Diet:     Diet   Diet regular Room service appropriate? Yes; Fluid consistency: Thin   Consultants: CCM  Subjective 07/20/23    Pt reports feeling well. Denies SOB.    Objective   Vitals:   07/19/23 2036 07/20/23 0041 07/20/23 0500 07/20/23 0528  BP:  105/68  123/63  Pulse:  89  (!) 105  Resp:  (!) 22  20  Temp:  97.6 F (36.4 C)  98.3 F (36.8 C)  TempSrc:  Oral  Oral  SpO2: 98% 97%  100%  Weight:   58.9 kg   Height:        Intake/Output Summary (Last 24 hours) at 07/20/2023 0759 Last data filed at 07/20/2023 0500 Gross per 24 hour  Intake 100 ml  Output 1650 ml  Net -1550 ml   Filed Weights   07/19/23 0433 07/19/23 0543 07/20/23 0500  Weight: 63.1 kg 63 kg 58.9 kg    Physical Exam:  General: awake, alert, NAD HEENT: dry mucuous membranes, hearing grossly normal Respiratory: normal respiratory effort. Shallow breathing Cardiovascular: quick capillary refill, normal S1/S2, RRR, no JVD, murmurs Gastrointestinal: soft, NT, ND Nervous: Alert, able to follow commands. Minimally conversant Extremities: moves all equally, no edema, normal tone Skin: dry, intact, normal temperature, normal color. No rashes, lesions or ulcers on exposed skin Psychiatry: flat mood, congruent affect  Labs   I have personally reviewed the following labs and imaging studies CBC    Component Value Date/Time   WBC 6.9 07/20/2023 0616   RBC 5.36 07/20/2023  0616   HGB 15.8 07/20/2023 0616   HGB 13.8 04/02/2023 1005   HGB 14.3 12/08/2013 1530   HCT 47.8 07/20/2023 0616   HCT 44.5 12/08/2013 1530   PLT 216 07/20/2023 0616   PLT 213 04/02/2023 1005   PLT 237 12/08/2013 1530   MCV 89.2 07/20/2023 0616   MCV 94 12/08/2013 1530   MCH 29.5 07/20/2023 0616   MCHC 33.1 07/20/2023 0616   RDW 14.4 07/20/2023 0616   RDW 14.8 (H) 12/08/2013 1530   LYMPHSABS 0.4 (L) 07/12/2023 0812   LYMPHSABS 1.4 12/08/2013 1530   MONOABS 0.5 07/12/2023 0812   MONOABS  0.5 12/08/2013 1530   EOSABS 0.3 07/12/2023 0812   EOSABS 0.3 12/08/2013 1530   BASOSABS 0.1 07/12/2023 0812   BASOSABS 0.1 12/08/2013 1530      Latest Ref Rng & Units 07/20/2023    6:16 AM 07/17/2023    4:57 AM 07/16/2023    4:15 AM  BMP  Glucose 70 - 99 mg/dL 161  93  72   BUN 8 - 23 mg/dL 27  21  29    Creatinine 0.61 - 1.24 mg/dL 0.96  0.45  4.09   Sodium 135 - 145 mmol/L 148  145  152   Potassium 3.5 - 5.1 mmol/L 4.1  3.7  4.2   Chloride 98 - 111 mmol/L 108  105  109   CO2 22 - 32 mmol/L 29  32  32   Calcium 8.9 - 10.3 mg/dL 9.9  9.1  9.6     No results found.  Disposition Plan & Communication  Patient status: Inpatient  Admitted From: Group home Planned disposition location: Group home Anticipated discharge date: TBD pending respiratory improvement   Family Communication: none at bedside    Author: Leeroy Bock, DO [facility] 07/20/2023, 7:59 AM   Available by Epic secure chat 7AM-7PM. If 7PM-7AM, please contact night-coverage.  [facility] contact information found on [email or web address].

## 2023-07-20 NOTE — TOC Progression Note (Signed)
 Transition of Care [facility]) - Progression Note    Patient Details  Name: HATIM HOMANN MRN: [id number] Date of Birth: 09/18/1955  Transition of Care [facility] CM/SW Contact  Bing Quarry, RN Phone Number: 07/20/2023, 4:21 PM  Clinical Narrative:  3/1: Positive for influenza A, required mechanical ventilation, now on HHFNC. Attempting to wean. NMS yet.    Gabriel Cirri MSN RN CM  RN Case Manager [place]  Transitions of Care Direct Dial: [phone] (Weekends Only) [facility] Main Office Phone: [phone] [facility] Fax: [phone] [email or web address]           Expected Discharge Plan and Services                                               Social Determinants of Health (SDOH) Interventions SDOH Screenings   Food Insecurity: Patient Unable To Answer (07/13/2023)  Housing: Patient Unable To Answer (07/13/2023)  Transportation Needs: Patient Unable To Answer (07/13/2023)  Utilities: Patient Unable To Answer (07/13/2023)  Depression (PHQ2-9): Low Risk  (02/25/2019)  Financial Resource Strain: Low Risk  (06/30/2018)  Physical Activity: Inactive (06/30/2018)  Social Connections: Patient Unable To Answer (07/13/2023)  Stress: Stress Concern Present (06/30/2018)  Tobacco Use: High Risk (07/12/2023)    Readmission Risk Interventions     No data to display

## 2023-07-20 NOTE — Plan of Care (Signed)
  Problem: Activity: Goal: Ability to tolerate increased activity will improve Outcome: Progressing   Problem: Respiratory: Goal: Ability to maintain a clear airway and adequate ventilation will improve Outcome: Progressing   Problem: Role Relationship: Goal: Method of communication will improve Outcome: Progressing   Problem: Education: Goal: Knowledge of General Education information will improve Description: Including pain rating scale, medication(s)/side effects and non-pharmacologic comfort measures Outcome: Progressing   Problem: Health Behavior/Discharge Planning: Goal: Ability to manage health-related needs will improve Outcome: Progressing   Problem: Clinical Measurements: Goal: Ability to maintain clinical measurements within normal limits will improve Outcome: Progressing Goal: Will remain free from infection Outcome: Progressing Goal: Diagnostic test results will improve Outcome: Progressing Goal: Respiratory complications will improve Outcome: Progressing Goal: Cardiovascular complication will be avoided Outcome: Progressing   Problem: Activity: Goal: Risk for activity intolerance will decrease Outcome: Progressing   Problem: Nutrition: Goal: Adequate nutrition will be maintained Outcome: Progressing   Problem: Coping: Goal: Level of anxiety will decrease Outcome: Progressing   Problem: Elimination: Goal: Will not experience complications related to bowel motility Outcome: Progressing Goal: Will not experience complications related to urinary retention Outcome: Progressing   Problem: Pain Managment: Goal: General experience of comfort will improve and/or be controlled Outcome: Progressing   Problem: Safety: Goal: Ability to remain free from injury will improve Outcome: Progressing   Problem: Skin Integrity: Goal: Risk for impaired skin integrity will decrease Outcome: Progressing   Problem: Education: Goal: Ability to describe self-care  measures that may prevent or decrease complications (Diabetes Survival Skills Education) will improve Outcome: Progressing Goal: Individualized Educational Video(s) Outcome: Progressing   Problem: Coping: Goal: Ability to adjust to condition or change in health will improve Outcome: Progressing   Problem: Fluid Volume: Goal: Ability to maintain a balanced intake and output will improve Outcome: Progressing   Problem: Health Behavior/Discharge Planning: Goal: Ability to identify and utilize available resources and services will improve Outcome: Progressing Goal: Ability to manage health-related needs will improve Outcome: Progressing   Problem: Metabolic: Goal: Ability to maintain appropriate glucose levels will improve Outcome: Progressing   Problem: Nutritional: Goal: Maintenance of adequate nutrition will improve Outcome: Progressing Goal: Progress toward achieving an optimal weight will improve Outcome: Progressing   Problem: Skin Integrity: Goal: Risk for impaired skin integrity will decrease Outcome: Progressing   Problem: Tissue Perfusion: Goal: Adequacy of tissue perfusion will improve Outcome: Progressing

## 2023-07-20 NOTE — Plan of Care (Signed)
  Problem: Activity: Goal: Ability to tolerate increased activity will improve Outcome: Progressing   Problem: Respiratory: Goal: Ability to maintain a clear airway and adequate ventilation will improve Outcome: Progressing   Problem: Role Relationship: Goal: Method of communication will improve Outcome: Progressing   Problem: Education: Goal: Knowledge of General Education information will improve Description: Including pain rating scale, medication(s)/side effects and non-pharmacologic comfort measures Outcome: Progressing   Problem: Health Behavior/Discharge Planning: Goal: Ability to manage health-related needs will improve Outcome: Progressing

## 2023-07-21 DIAGNOSIS — J9601 Acute respiratory failure with hypoxia: Secondary | ICD-10-CM | POA: Diagnosis not present

## 2023-07-21 DIAGNOSIS — R6521 Severe sepsis with septic shock: Secondary | ICD-10-CM | POA: Diagnosis not present

## 2023-07-21 DIAGNOSIS — A419 Sepsis, unspecified organism: Secondary | ICD-10-CM | POA: Diagnosis not present

## 2023-07-21 LAB — BLOOD GAS, VENOUS
Acid-Base Excess: 14.5 mmol/L — ABNORMAL HIGH (ref 0.0–2.0)
Bicarbonate: 40.8 mmol/L — ABNORMAL HIGH (ref 20.0–28.0)
O2 Saturation: 81.5 %
Patient temperature: 37
pCO2, Ven: 56 mmHg (ref 44–60)
pH, Ven: 7.47 — ABNORMAL HIGH (ref 7.25–7.43)
pO2, Ven: 49 mmHg — ABNORMAL HIGH (ref 32–45)

## 2023-07-21 LAB — GLUCOSE, CAPILLARY
Glucose-Capillary: 104 mg/dL — ABNORMAL HIGH (ref 70–99)
Glucose-Capillary: 140 mg/dL — ABNORMAL HIGH (ref 70–99)

## 2023-07-21 MED ORDER — BUDESONIDE 0.5 MG/2ML IN SUSP
0.5000 mg | Freq: Two times a day (BID) | RESPIRATORY_TRACT | Status: AC
  Administered 2023-07-21 – 2023-07-23 (×5): 0.5 mg via RESPIRATORY_TRACT
  Filled 2023-07-21 (×5): qty 2

## 2023-07-21 MED ORDER — IPRATROPIUM-ALBUTEROL 0.5-2.5 (3) MG/3ML IN SOLN
3.0000 mL | Freq: Four times a day (QID) | RESPIRATORY_TRACT | Status: DC
  Administered 2023-07-21 – 2023-07-22 (×4): 3 mL via RESPIRATORY_TRACT
  Filled 2023-07-21 (×4): qty 3

## 2023-07-21 NOTE — Plan of Care (Signed)
  Problem: Activity: Goal: Ability to tolerate increased activity will improve Outcome: Progressing   Problem: Respiratory: Goal: Ability to maintain a clear airway and adequate ventilation will improve Outcome: Progressing   Problem: Role Relationship: Goal: Method of communication will improve Outcome: Progressing   Problem: Education: Goal: Knowledge of General Education information will improve Description: Including pain rating scale, medication(s)/side effects and non-pharmacologic comfort measures Outcome: Progressing   Problem: Health Behavior/Discharge Planning: Goal: Ability to manage health-related needs will improve Outcome: Progressing   Problem: Clinical Measurements: Goal: Ability to maintain clinical measurements within normal limits will improve Outcome: Progressing Goal: Will remain free from infection Outcome: Progressing Goal: Diagnostic test results will improve Outcome: Progressing Goal: Respiratory complications will improve Outcome: Progressing Goal: Cardiovascular complication will be avoided Outcome: Progressing   Problem: Activity: Goal: Risk for activity intolerance will decrease Outcome: Progressing   Problem: Nutrition: Goal: Adequate nutrition will be maintained Outcome: Progressing   Problem: Coping: Goal: Level of anxiety will decrease Outcome: Progressing   Problem: Elimination: Goal: Will not experience complications related to bowel motility Outcome: Progressing Goal: Will not experience complications related to urinary retention Outcome: Progressing   Problem: Pain Managment: Goal: General experience of comfort will improve and/or be controlled Outcome: Progressing   Problem: Safety: Goal: Ability to remain free from injury will improve Outcome: Progressing   Problem: Skin Integrity: Goal: Risk for impaired skin integrity will decrease Outcome: Progressing   Problem: Education: Goal: Ability to describe self-care  measures that may prevent or decrease complications (Diabetes Survival Skills Education) will improve Outcome: Progressing Goal: Individualized Educational Video(s) Outcome: Progressing   Problem: Coping: Goal: Ability to adjust to condition or change in health will improve Outcome: Progressing   Problem: Fluid Volume: Goal: Ability to maintain a balanced intake and output will improve Outcome: Progressing   Problem: Health Behavior/Discharge Planning: Goal: Ability to identify and utilize available resources and services will improve Outcome: Progressing Goal: Ability to manage health-related needs will improve Outcome: Progressing   Problem: Metabolic: Goal: Ability to maintain appropriate glucose levels will improve Outcome: Progressing   Problem: Nutritional: Goal: Maintenance of adequate nutrition will improve Outcome: Progressing Goal: Progress toward achieving an optimal weight will improve Outcome: Progressing   Problem: Skin Integrity: Goal: Risk for impaired skin integrity will decrease Outcome: Progressing   Problem: Tissue Perfusion: Goal: Adequacy of tissue perfusion will improve Outcome: Progressing

## 2023-07-21 NOTE — Progress Notes (Addendum)
 PROGRESS NOTE  Jeremiah Nguyen    DOB: 03-22-1956, 68 y.o.  ZOX:[id number]    Code Status: Prior   DOA: 07/12/2023   LOS: 9   Brief hospital course  Jeremiah Nguyen is a 68 year old male with a past medical history significant for COPD, alcohol dependence, and seizure disorder who presented to [facility] ED on 07/12/2023 from his group home due to altered mental status.  Per report he has been altered over the last 24 hours.  On arrival to the ED he remained somnolent and altered, oriented only to self.  Given his respiratory distress he was placed on BiPAP and eventually required intubation and levophed.  Positive for influenza A. Started on tamiflu.   07/15/23- patient passed SBT today.  He is liberated from MV on to [facility]. Remains with encephalopathy.  07/16/23- patient is improved overnight. He is on home psychiatric medications now.   07/21/23 -continue to wean  Assessment & Plan  Principal Problem:   Acute respiratory failure with hypoxia (HCC) Active Problems:   Malnutrition of moderate degree  Severe ACUTE Hypoxic and Hypercapnic Respiratory Failure Influenza positive- S/p intubation. Currently HHFNC 45 and progress appears to have been plateaued for a couple days. Using some accessory muscles on exam today.  - consulted RT for trial of bipap and VBG -continue full scope of therapy - IS, nebulizer therapy, IV steroids - ICS - wean oxygen as tolerated - mucolytics  Antibiotics completed. Resp culture- multi-species, Bcx NGTD, MRSA swab neg - flutter valve  Hypoxic delirium- improving. Oriented to self, place, month. Mitts have been removed   SEPTIC shock-PRESENT ON ADMISSION - NOW RESOLVED SOURCE-flu, pneumonia. Blood cultures negative. Hemodynamically stable Completed tamiflu     ACUTE KIDNEY INJURY/Renal Failure- resolved Foley removed -Avoid nephrotoxic agents -Follow urine output, BMP  H/o seizures-  Continue home meds, lamictal  HTN  HFrEF-  - continue home metoprolol    Schizophrenia - continue chloromazine - continue sertraline   Disposition - TOC consulted - PT/OT   Body mass index is 20.18 kg/m.  VTE ppx: enoxaparin (LOVENOX) injection 40 mg Start: 07/14/23 2200  Diet:     Diet   Diet regular Room service appropriate? Yes; Fluid consistency: Thin   Consultants: CCM  Subjective 07/21/23    Pt reports feeling well. Denies SOB. Asks for a tissue for nasal drainage   Objective   Vitals:   07/20/23 2100 07/21/23 0018 07/21/23 0357 07/21/23 0451  BP:  107/60 110/63   Pulse:  89 96   Resp: 17 (!) 21 20   Temp:  97.8 F (36.6 C) 98.5 F (36.9 C)   TempSrc:  Oral Oral   SpO2:  95% 93%   Weight:    60.2 kg  Height:        Intake/Output Summary (Last 24 hours) at 07/21/2023 0816 Last data filed at 07/21/2023 0400 Gross per 24 hour  Intake 200 ml  Output 1900 ml  Net -1700 ml   Filed Weights   07/19/23 0543 07/20/23 0500 07/21/23 0451  Weight: 63 kg 58.9 kg 60.2 kg    Physical Exam:  General: awake, alert, NAD HEENT: dry mucuous membranes mouth, clear rhinorrhea present, hearing grossly normal Respiratory: abdominal muscle use. Shallow breathing Cardiovascular: quick capillary refill, normal S1/S2, RRR, no JVD, murmurs Gastrointestinal: soft, NT, ND Nervous: Alert, able to follow commands. Minimally conversant Extremities: moves all equally, no edema, normal tone Skin: dry, intact, normal temperature, normal color. No rashes, lesions or ulcers on exposed skin  Psychiatry: flat mood, congruent affect  Labs   I have personally reviewed the following labs and imaging studies CBC    Component Value Date/Time   WBC 6.9 07/20/2023 0616   RBC 5.36 07/20/2023 0616   HGB 15.8 07/20/2023 0616   HGB 13.8 04/02/2023 1005   HGB 14.3 12/08/2013 1530   HCT 47.8 07/20/2023 0616   HCT 44.5 12/08/2013 1530   PLT 216 07/20/2023 0616   PLT 213 04/02/2023 1005   PLT 237 12/08/2013 1530   MCV 89.2 07/20/2023 0616   MCV 94 12/08/2013  1530   MCH 29.5 07/20/2023 0616   MCHC 33.1 07/20/2023 0616   RDW 14.4 07/20/2023 0616   RDW 14.8 (H) 12/08/2013 1530   LYMPHSABS 0.4 (L) 07/12/2023 0812   LYMPHSABS 1.4 12/08/2013 1530   MONOABS 0.5 07/12/2023 0812   MONOABS 0.5 12/08/2013 1530   EOSABS 0.3 07/12/2023 0812   EOSABS 0.3 12/08/2013 1530   BASOSABS 0.1 07/12/2023 0812   BASOSABS 0.1 12/08/2013 1530      Latest Ref Rng & Units 07/20/2023    6:16 AM 07/17/2023    4:57 AM 07/16/2023    4:15 AM  BMP  Glucose 70 - 99 mg/dL 161  93  72   BUN 8 - 23 mg/dL 27  21  29    Creatinine 0.61 - 1.24 mg/dL 0.96  0.45  4.09   Sodium 135 - 145 mmol/L 148  145  152   Potassium 3.5 - 5.1 mmol/L 4.1  3.7  4.2   Chloride 98 - 111 mmol/L 108  105  109   CO2 22 - 32 mmol/L 29  32  32   Calcium 8.9 - 10.3 mg/dL 9.9  9.1  9.6     No results found.  Disposition Plan & Communication  Patient status: Inpatient  Admitted From: Group home Planned disposition location: Group home Anticipated discharge date: TBD pending respiratory improvement   Family Communication: none at bedside    Author: Leeroy Bock, DO [facility] 07/21/2023, 8:16 AM   Available by Epic secure chat 7AM-7PM. If 7PM-7AM, please contact night-coverage.  [facility] contact information found on [email or web address].

## 2023-07-22 DIAGNOSIS — R6521 Severe sepsis with septic shock: Secondary | ICD-10-CM | POA: Diagnosis not present

## 2023-07-22 DIAGNOSIS — J9601 Acute respiratory failure with hypoxia: Secondary | ICD-10-CM | POA: Diagnosis not present

## 2023-07-22 DIAGNOSIS — A419 Sepsis, unspecified organism: Secondary | ICD-10-CM | POA: Diagnosis not present

## 2023-07-22 LAB — GLUCOSE, CAPILLARY
Glucose-Capillary: 122 mg/dL — ABNORMAL HIGH (ref 70–99)
Glucose-Capillary: 152 mg/dL — ABNORMAL HIGH (ref 70–99)

## 2023-07-22 MED ORDER — SODIUM CHLORIDE 0.9 % IV SOLN: INTRAVENOUS | Status: AC

## 2023-07-22 MED ORDER — IPRATROPIUM-ALBUTEROL 0.5-2.5 (3) MG/3ML IN SOLN
3.0000 mL | Freq: Three times a day (TID) | RESPIRATORY_TRACT | Status: DC
  Administered 2023-07-22 – 2023-07-27 (×15): 3 mL via RESPIRATORY_TRACT
  Filled 2023-07-22 (×14): qty 3

## 2023-07-22 NOTE — Progress Notes (Signed)
 Physical Therapy Treatment Patient Details Name: Jeremiah Nguyen MRN: [id number] DOB: 26-Jun-1955 Today's Date: 07/22/2023   History of Present Illness Patient is a 68 year old male with history of COPD, alcohol dependence, and seizure disorder who presented on 07/12/2023 from group home due to altered mental status. Positive for influenza A, required mechanical ventilation, now on HHFNC.    PT Comments  Patient is agreeable to PT session. He was able to stand x 2 bouts with Max A from elevated bed height with hand held assistance. Standing tolerance of no more than 20 seconds and patient unable to take steps at this time. Recommend to continue PT to maximize independence and facilitate return to prior level of function. Rehabilitation < 3 hours/day recommended after this hospital stay.    If plan is discharge home, recommend the following: Two people to help with walking and/or transfers;A lot of help with bathing/dressing/bathroom;Assist for transportation;Help with stairs or ramp for entrance;Supervision due to cognitive status;Assistance with cooking/housework;Assistance with feeding;Direct supervision/assist for medications management;Direct supervision/assist for financial management   Can travel by private vehicle     No  Equipment Recommendations   (to be determined at next level of care)    Recommendations for Other Services       Precautions / Restrictions Precautions Precautions: Fall Precaution/Restrictions Comments: monitor Sp02 Restrictions Weight Bearing Restrictions Per Provider Order: No     Mobility  Bed Mobility Overal bed mobility: Needs Assistance Bed Mobility: Supine to Sit     Supine to sit: Mod assist Sit to supine: Max assist   General bed mobility comments: cues for sequencing    Transfers Overall transfer level: Needs assistance Equipment used: 1 person hand held assist Transfers: Sit to/from Stand Sit to Stand: Max assist, From elevated surface            General transfer comment: patient able to stand x 2 bouts from elevated surface with lifting and lowering assistance provided    Ambulation/Gait             Pre-gait activities: emphasis on increasing standing tolerance which is currently no more than 20 seconds. attempted weight shftting faciliation. patient is unable to take any steps at this time     Stairs             Wheelchair Mobility     Tilt Bed    Modified Rankin (Stroke Patients Only)       Balance Overall balance assessment: Needs assistance Sitting-balance support: Feet supported Sitting balance-Leahy Scale: Fair   Postural control: Posterior lean Standing balance support: Single extremity supported Standing balance-Leahy Scale: Poor Standing balance comment: external support required                            Communication Communication Communication: Impaired Factors Affecting Communication: Difficulty expressing self  Cognition Arousal: Alert Behavior During Therapy: Restless   PT - Cognitive impairments: No family/caregiver present to determine baseline, Memory, Orientation, Sequencing, Safety/Judgement   Orientation impairments: Place, Time, Situation                     Following commands: Impaired Following commands impaired: Follows one step commands inconsistently    Cueing Cueing Techniques: Verbal cues, Tactile cues, Visual cues  Exercises      General Comments General comments (skin integrity, edema, etc.): Sp02 90% on 40L02      Pertinent Vitals/Pain Pain Assessment Pain Assessment: No/denies pain  Home Living                          Prior Function            PT Goals (current goals can now be found in the care plan section) Acute Rehab PT Goals Patient Stated Goal: to get out of bed PT Goal Formulation: With patient Time For Goal Achievement: 08/01/23 Potential to Achieve Goals: Fair Progress towards PT goals:  Progressing toward goals    Frequency    Min 1X/week      PT Plan      Co-evaluation              AM-PAC PT "6 Clicks" Mobility   Outcome Measure  Help needed turning from your back to your side while in a flat bed without using bedrails?: A Lot Help needed moving from lying on your back to sitting on the side of a flat bed without using bedrails?: A Lot Help needed moving to and from a bed to a chair (including a wheelchair)?: Total Help needed standing up from a chair using your arms (e.g., wheelchair or bedside chair)?: Total Help needed to walk in hospital room?: Total Help needed climbing 3-5 steps with a railing? : Total 6 Click Score: 8    End of Session Equipment Utilized During Treatment: Oxygen Activity Tolerance: Patient tolerated treatment well;Patient limited by fatigue Patient left: in bed;with call bell/phone within reach;with bed alarm set   PT Visit Diagnosis: Muscle weakness (generalized) (M62.81);Unsteadiness on feet (R26.81)     Time: 4098-1191 PT Time Calculation (min) (ACUTE ONLY): 12 min  Charges:    $Therapeutic Activity: 8-22 mins PT General Charges $$ ACUTE PT VISIT: 1 Visit                     Donna Bernard, PT, MPT   [name] 07/22/2023, 11:00 AM

## 2023-07-22 NOTE — Progress Notes (Signed)
 PROGRESS NOTE Jeremiah Nguyen    DOB: 1955-07-15, 68 y.o.  ZOX:[id number]    Code Status: Prior   DOA: 07/12/2023   LOS: 10  Brief hospital course  Jeremiah Nguyen is a 68 year old male with a past medical history significant for COPD, alcohol dependence, and seizure disorder who presented to [facility] ED on 07/12/2023 from his group home due to altered mental status.  Per report he has been altered over the last 24 hours.  On arrival to the ED he remained somnolent and altered, oriented only to self.  Given his respiratory distress he was placed on BiPAP and eventually required intubation and levophed.  Positive for influenza A. Started on tamiflu.   07/15/23- patient passed SBT today.  He is liberated from MV on to [facility]. Remains with encephalopathy.  07/16/23- patient is improved overnight. He is on home psychiatric medications now.   07/22/23 -continue to wean  Assessment & Plan  Principal Problem:   Acute respiratory failure with hypoxia (HCC) Active Problems:   Malnutrition of moderate degree  Severe ACUTE Hypoxic and Hypercapnic Respiratory Failure Influenza positive- S/p intubation. S/p tamiflu. Currently HHFNC 45 and progress appears to have been plateaued for a couple days. Normal work of breathing today - consulted RT for trial of bipap and VBG -continue full scope of therapy - IS, nebulizer therapy, IV steroids - ICS - wean oxygen as tolerated - mucolytics  Antibiotics completed. Resp culture- multi-species, Bcx NGTD, MRSA swab neg - flutter valve  Hypoxic delirium- improving. Oriented to self, place, month. Mitts have been removed   SEPTIC shock-PRESENT ON ADMISSION - NOW RESOLVED SOURCE-flu, pneumonia. Blood cultures negative. Hemodynamically stable Completed tamiflu     ACUTE KIDNEY INJURY/Renal Failure- resolved Foley removed -Avoid nephrotoxic agents -Follow urine output, BMP  H/o seizures-  Continue home meds, lamictal  HTN  HFrEF-  - continue home metoprolol    Schizophrenia - continue chloromazine - continue sertraline   Disposition - TOC consulted - PT/OT   Body mass index is 20.18 kg/m.  VTE ppx: enoxaparin (LOVENOX) injection 40 mg Start: 07/14/23 2200  Diet:     Diet   Diet regular Room service appropriate? Yes; Fluid consistency: Thin   Consultants: CCM  Subjective 07/22/23    Pt reports feeling well. Denies concerns. Asks for water.    Objective   Vitals:   07/21/23 2043 07/22/23 0000 07/22/23 0139 07/22/23 0414  BP: 100/60 102/61  (!) 115/54  Pulse: (!) 102 98  80  Resp: 19   17  Temp: 98.7 F (37.1 C) 98 F (36.7 C)  98.8 F (37.1 C)  TempSrc: Oral Oral  Oral  SpO2: 96% 98% 96% 99%  Weight:      Height:        Intake/Output Summary (Last 24 hours) at 07/22/2023 0740 Last data filed at 07/22/2023 0600 Gross per 24 hour  Intake 200 ml  Output 1200 ml  Net -1000 ml   Filed Weights   07/19/23 0543 07/20/23 0500 07/21/23 0451  Weight: 63 kg 58.9 kg 60.2 kg    Physical Exam:  General: awake, alert, NAD HEENT: dry mucuous membranes mouth, clear rhinorrhea present, hearing grossly normal Respiratory: abdominal muscle use. Shallow breathing Cardiovascular: quick capillary refill, normal S1/S2, RRR, no JVD, murmurs Gastrointestinal: soft, NT, ND Nervous: Alert, Jeremiah to follow commands. Minimally conversant Extremities: moves all equally, no edema, normal tone Skin: dry, intact, normal temperature, normal color. No rashes, lesions or ulcers on exposed skin Psychiatry: flat mood,  congruent affect  Labs   I have personally reviewed the following labs and imaging studies CBC    Component Value Date/Time   WBC 6.9 07/20/2023 0616   RBC 5.36 07/20/2023 0616   HGB 15.8 07/20/2023 0616   HGB 13.8 04/02/2023 1005   HGB 14.3 12/08/2013 1530   HCT 47.8 07/20/2023 0616   HCT 44.5 12/08/2013 1530   PLT 216 07/20/2023 0616   PLT 213 04/02/2023 1005   PLT 237 12/08/2013 1530   MCV 89.2 07/20/2023 0616   MCV 94  12/08/2013 1530   MCH 29.5 07/20/2023 0616   MCHC 33.1 07/20/2023 0616   RDW 14.4 07/20/2023 0616   RDW 14.8 (H) 12/08/2013 1530   LYMPHSABS 0.4 (L) 07/12/2023 0812   LYMPHSABS 1.4 12/08/2013 1530   MONOABS 0.5 07/12/2023 0812   MONOABS 0.5 12/08/2013 1530   EOSABS 0.3 07/12/2023 0812   EOSABS 0.3 12/08/2013 1530   BASOSABS 0.1 07/12/2023 0812   BASOSABS 0.1 12/08/2013 1530      Latest Ref Rng & Units 07/20/2023    6:16 AM 07/17/2023    4:57 AM 07/16/2023    4:15 AM  BMP  Glucose 70 - 99 mg/dL 914  93  72   BUN 8 - 23 mg/dL 27  21  29    Creatinine 0.61 - 1.24 mg/dL 7.82  9.56  2.13   Sodium 135 - 145 mmol/L 148  145  152   Potassium 3.5 - 5.1 mmol/L 4.1  3.7  4.2   Chloride 98 - 111 mmol/L 108  105  109   CO2 22 - 32 mmol/L 29  32  32   Calcium 8.9 - 10.3 mg/dL 9.9  9.1  9.6     No results found.  Disposition Plan & Communication  Patient status: Inpatient  Admitted From: Group home Planned disposition location: Group home Anticipated discharge date: TBD pending respiratory improvement   Family Communication: none at bedside    Author: Leeroy Bock, DO [facility] 07/22/2023, 7:40 AM   Available by Epic secure chat 7AM-7PM. If 7PM-7AM, please contact night-coverage.  [facility] contact information found on [email or web address].

## 2023-07-23 DIAGNOSIS — A419 Sepsis, unspecified organism: Secondary | ICD-10-CM | POA: Diagnosis not present

## 2023-07-23 DIAGNOSIS — J9601 Acute respiratory failure with hypoxia: Secondary | ICD-10-CM | POA: Diagnosis not present

## 2023-07-23 DIAGNOSIS — R6521 Severe sepsis with septic shock: Secondary | ICD-10-CM | POA: Diagnosis not present

## 2023-07-23 LAB — BASIC METABOLIC PANEL
Anion gap: 7 (ref 5–15)
BUN: 19 mg/dL (ref 8–23)
CO2: 31 mmol/L (ref 22–32)
Calcium: 9.2 mg/dL (ref 8.9–10.3)
Chloride: 102 mmol/L (ref 98–111)
Creatinine, Ser: 0.93 mg/dL (ref 0.61–1.24)
GFR, Estimated: >60 mL/min (ref >60)
Glucose, Bld: 106 mg/dL — ABNORMAL HIGH (ref 70–99)
Potassium: 3.9 mmol/L (ref 3.5–5.1)
Sodium: 140 mmol/L (ref 135–145)

## 2023-07-23 MED ORDER — METOPROLOL SUCCINATE ER 25 MG PO TB24
25.0000 mg | ORAL_TABLET | Freq: Every day | ORAL | Status: DC
  Administered 2023-07-24 – 2023-08-12 (×18): 25 mg via ORAL
  Filled 2023-07-23 (×20): qty 1

## 2023-07-23 NOTE — Progress Notes (Signed)
 Occupational Therapy Treatment Patient Details Name: Jeremiah Nguyen MRN: [id number] DOB: 06-18-1955 Today's Date: 07/23/2023   History of present illness Patient is a 68 year old male with history of COPD, alcohol dependence, and seizure disorder who presented on 07/12/2023 from group home due to altered mental status. Positive for influenza A, required mechanical ventilation, now on HHFNC.   OT comments  Chart reviewed to date, pt greeted in bed, oriented to self, place, grossly to date, poor awareness of current level of functioning/deficits, increased time for simple one step directives. Co tx with PT to optimize mobility. Please see further details below. MAX A required for bed mobility, STS with MOD-MAX A, Step pivot transfer with MOD-MAX A +2 for lines/leads to chair and then back to bed as pt reports he does not want to sit in the chair. Pt is making progress towards goals, discharge recommendation remains appropriate. OT will continue to follow.       If plan is discharge home, recommend the following:  Two people to help with bathing/dressing/bathroom;Help with stairs or ramp for entrance;A little help with walking and/or transfers;Supervision due to cognitive status   Equipment Recommendations  Other (comment) (defer)    Recommendations for Other Services      Precautions / Restrictions Precautions Precautions: Fall Recall of Precautions/Restrictions: Impaired Precaution/Restrictions Comments: monitor Sp02 Restrictions Weight Bearing Restrictions Per Provider Order: No       Mobility Bed Mobility Overal bed mobility: Needs Assistance Bed Mobility: Supine to Sit, Sit to Supine     Supine to sit: Max assist Sit to supine: Max assist   General bed mobility comments: MAX A +1 for BLE management    Transfers Overall transfer level: Needs assistance Equipment used: 1 person hand held assist Transfers: Sit to/from Stand Sit to Stand: Mod assist, Max assist, From elevated  surface                 Balance Overall balance assessment: Needs assistance Sitting-balance support: Feet supported Sitting balance-Leahy Scale: Good     Standing balance support: Single extremity supported Standing balance-Leahy Scale: Poor                             ADL either performed or assessed with clinical judgement   ADL Overall ADL's : Needs assistance/impaired                     Lower Body Dressing: Maximal assistance Lower Body Dressing Details (indicate cue type and reason): donn/doff socks Toilet Transfer: Moderate assistance;Maximal assistance Toilet Transfer Details (indicate cue type and reason): hand held, step pivot transfer, simulated to and from bedside chair (pt did not want to sit in chair); step by step vcs for technique +2 for lines/leads                Extremity/Trunk Assessment Upper Extremity Assessment Upper Extremity Assessment: Generalized weakness   Lower Extremity Assessment Lower Extremity Assessment: Generalized weakness        Vision Patient Visual Report: No change from baseline     Perception     Praxis     Communication Communication Communication: No apparent difficulties   Cognition Arousal: Alert Behavior During Therapy: Restless Cognition: Cognition impaired, History of cognitive impairments   Orientation impairments: Situation Awareness: Online awareness impaired, Intellectual awareness impaired Memory impairment (select all impairments): Short-term memory, Working [employer] fast streamer, Conservation officer, historic buildings, Non-declarative long-term memory Attention impairment (select first level  of impairment): Sustained attention Executive functioning impairment (select all impairments): Initiation, Organization, Sequencing, Reasoning, Problem solving OT - Cognition Comments: decreased awareness of current level of functioning- reports he is "better" and could walk however is unable to do so                  Following commands: Impaired Following commands impaired: Follows one step commands with increased time      Cueing   Cueing Techniques: Verbal cues, Tactile cues, Visual cues  Exercises Other Exercises Other Exercises: edu re: role of OT, role of rehab, importance of progressing mobitliy    Shoulder Instructions       General Comments Spo2 >90% on HHFNC 40L throughout, HR 90-100 bpm during mobilty    Pertinent Vitals/ Pain       Pain Assessment Pain Assessment: No/denies pain  Home Living                                          Prior Functioning/Environment              Frequency  Min 1X/week        Progress Toward Goals  OT Goals(current goals can now be found in the care plan section)  Progress towards OT goals: Progressing toward goals  Acute Rehab OT Goals Time For Goal Achievement: 08/01/23  Plan      Co-evaluation    PT/OT/SLP Co-Evaluation/Treatment: Yes     OT goals addressed during session: ADL's and self-care      AM-PAC OT "6 Clicks" Daily Activity     Outcome Measure   Help from another person eating meals?: A Little Help from another person taking care of personal grooming?: A Little Help from another person toileting, which includes using toliet, bedpan, or urinal?: A Lot Help from another person bathing (including washing, rinsing, drying)?: Total Help from another person to put on and taking off regular upper body clothing?: A Lot Help from another person to put on and taking off regular lower body clothing?: A Lot 6 Click Score: 13    End of Session Equipment Utilized During Treatment: Gait belt;Oxygen  OT Visit Diagnosis: Other abnormalities of gait and mobility (R26.89);Muscle weakness (generalized) (M62.81)   Activity Tolerance Patient tolerated treatment well   Patient Left in bed;with call bell/phone within reach;with bed alarm set   Nurse Communication Mobility status        Time:  2536-6440 OT Time Calculation (min): 30 min  Charges: OT General Charges $OT Visit: 1 Visit OT Treatments $Therapeutic Activity: 8-22 mins  .alu

## 2023-07-23 NOTE — Progress Notes (Signed)
 Physical Therapy Treatment Patient Details Name: Jeremiah Nguyen MRN: [id number] DOB: 01-03-1956 Today's Date: 07/23/2023   History of Present Illness Patient is a 68 year old male with history of COPD, alcohol dependence, and seizure disorder who presented on 07/12/2023 from group home due to altered mental status. Positive for influenza A, required mechanical ventilation, now on HHFNC.    PT Comments  Pt seen for skilled session with OT to progress functional mobility and provide increased assist for safe OOB activity. Pt currently requires Mod/MaxA for bed mobility and transfers. Attempted use of RW during sit to stand attempt, pt requested not to use, stating he could walk. Mod/MaxA for Stand/Step pivot bed<>chair. Pt required assist to return to bed after transferring to chair, stating he prefers the bed despite education on benefits of OOB. Pt positioned to comfort in bed with all needs in reach. Will continue to progress acutely as tolerated per POC. SpO2 97% on HFNC 40L throughout session.   If plan is discharge home, recommend the following: Two people to help with walking and/or transfers;A lot of help with bathing/dressing/bathroom;Assist for transportation;Help with stairs or ramp for entrance;Supervision due to cognitive status;Assistance with cooking/housework;Assistance with feeding;Direct supervision/assist for medications management;Direct supervision/assist for financial management   Can travel by private vehicle     No  Equipment Recommendations  Other (comment) (TBD)    Recommendations for Other Services       Precautions / Restrictions Precautions Precautions: Fall Recall of Precautions/Restrictions: Impaired Precaution/Restrictions Comments: monitor Sp02 Restrictions Weight Bearing Restrictions Per Provider Order: No     Mobility  Bed Mobility Overal bed mobility: Needs Assistance Bed Mobility: Supine to Sit, Sit to Supine     Supine to sit: Max assist Sit to  supine: Max assist   General bed mobility comments: MAX A +1 for BLE management    Transfers Overall transfer level: Needs assistance Equipment used: Rolling walker (2 wheels), None Transfers: Sit to/from Stand, Bed to chair/wheelchair/BSC Sit to Stand: Mod assist, Max assist, From elevated surface   Step pivot transfers: Mod assist, Max assist       General transfer comment: patient able to stand elevated surface with lifting and lowering assistance provided. Attempted standing at RW, pt declined using AD    Ambulation/Gait               General Gait Details: NT   Stairs             Wheelchair Mobility     Tilt Bed    Modified Rankin (Stroke Patients Only)       Balance Overall balance assessment: Needs assistance Sitting-balance support: Feet supported Sitting balance-Leahy Scale: Good     Standing balance support: Bilateral upper extremity supported Standing balance-Leahy Scale: Poor Standing balance comment: Attempted standing with RW and with physical assist of therapist w/o AD, Pt unable to attain upright standing                            Communication Communication Communication: Impaired Factors Affecting Communication: Difficulty expressing self;Reduced clarity of speech  Cognition Arousal: Alert Behavior During Therapy: Restless   PT - Cognitive impairments: No family/caregiver present to determine baseline, Memory, Orientation, Sequencing, Safety/Judgement   Orientation impairments: Place, Time, Situation                   PT - Cognition Comments: Able to state he would go to "school" on Wed & Fridays  from 8-3 Following commands: Impaired Following commands impaired: Follows one step commands with increased time    Cueing Cueing Techniques: Verbal cues, Tactile cues, Visual cues  Exercises Other Exercises Other Exercises: edu re: role of PT, role of rehab, importance of progressing mobitliy    General  Comments General comments (skin integrity, edema, etc.): HFNC, 40L, FiO2 75%, SpO2 remained in upper 90's throughout session      Pertinent Vitals/Pain Pain Assessment Pain Assessment: No/denies pain    Home Living                          Prior Function            PT Goals (current goals can now be found in the care plan section) Acute Rehab PT Goals Patient Stated Goal: to get out of bed    Frequency    Min 1X/week      PT Plan      Co-evaluation PT/OT/SLP Co-Evaluation/Treatment: Yes Reason for Co-Treatment: Complexity of the patient's impairments (multi-system involvement);For patient/therapist safety PT goals addressed during session: Mobility/safety with mobility OT goals addressed during session: ADL's and self-care      AM-PAC PT "6 Clicks" Mobility   Outcome Measure  Help needed turning from your back to your side while in a flat bed without using bedrails?: A Lot Help needed moving from lying on your back to sitting on the side of a flat bed without using bedrails?: A Lot Help needed moving to and from a bed to a chair (including a wheelchair)?: Total Help needed standing up from a chair using your arms (e.g., wheelchair or bedside chair)?: Total Help needed to walk in hospital room?: Total Help needed climbing 3-5 steps with a railing? : Total 6 Click Score: 8    End of Session Equipment Utilized During Treatment: Gait belt;Oxygen Activity Tolerance: Patient tolerated treatment well;Other (comment) (Did not comply with staying up in chair requiring assistance back to bed) Patient left: in bed;with call bell/phone within reach;with bed alarm set Nurse Communication: Mobility status PT Visit Diagnosis: Muscle weakness (generalized) (M62.81);Unsteadiness on feet (R26.81)     Time: 4098-1191 PT Time Calculation (min) (ACUTE ONLY): 29 min  Charges:    $Therapeutic Activity: 8-22 mins PT General Charges $$ ACUTE PT VISIT: 1 Visit                     Zadie Cleverly, PTA  Jannet Askew 07/23/2023, 4:43 PM

## 2023-07-23 NOTE — Progress Notes (Addendum)
 PROGRESS NOTE [name]    DOB: 09/04/1955, 68 y.o.  FAO:[id number]    Code Status: Prior   DOA: 07/12/2023   LOS: 11  Brief hospital course  Jeremiah Nguyen is a 68 year old male with a past medical history significant for COPD, alcohol dependence, and seizure disorder who presented to [facility] ED on 07/12/2023 from his group home due to altered mental status.  Per report he has been altered over the last 24 hours.  On arrival to the ED he remained somnolent and altered, oriented only to self.  Given his respiratory distress he was placed on BiPAP and eventually required intubation and levophed.  Positive for influenza A. Started on tamiflu.   07/15/23- patient passed SBT today.  He is liberated from MV on to [facility]. Remains with encephalopathy.  07/16/23- patient is improved overnight. He is on home psychiatric medications now.   07/23/23 -continue to wean  Assessment & Plan  Principal Problem:   Acute respiratory failure with hypoxia (HCC) Active Problems:   Malnutrition of moderate degree  Severe ACUTE Hypoxic and Hypercapnic Respiratory Failure Influenza positive- S/p intubation. S/p tamiflu. Currently HHFNC 45 and progress appears to have been plateaued for a couple days. Normal work of breathing today - RT following to guide weaning -continue full scope of therapy - IS, nebulizer therapy, IV steroids - ICS - wean oxygen as tolerated - mucolytics  Antibiotics completed. Resp culture- multi-species, Bcx NGTD, MRSA swab neg - flutter valve  Hypoxic delirium- improving. Oriented to self, place, month. Mitts have been removed - PT/OT   SEPTIC shock-PRESENT ON ADMISSION - NOW RESOLVED SOURCE-flu, pneumonia. Blood cultures negative. Hemodynamically stable Completed tamiflu     ACUTE KIDNEY INJURY/Renal Failure- resolved Foley removed -Avoid nephrotoxic agents -Follow urine output, BMP  H/o seizures-  Continue home meds, lamictal  HTN  HFrEF-  - continue home metoprolol and  decrease dose due to low/normal BP  Schizophrenia - continue chloromazine - continue sertraline   Disposition - TOC consulted - PT/OT   Body mass index is 20.55 kg/m.  VTE ppx: enoxaparin (LOVENOX) injection 40 mg Start: 07/14/23 2200  Diet:     Diet   Diet regular Room service appropriate? Yes; Fluid consistency: Thin   Consultants: CCM  Subjective 07/23/23    Pt reports feeling well. Denies concerns. Family at bedside has him in good spirits.    Objective   Vitals:   07/22/23 2012 07/22/23 2344 07/23/23 0347 07/23/23 0500  BP: 109/61 (!) 97/51 (!) 106/54   Pulse: 90 87 75   Resp: 18 18 18    Temp: 98.7 F (37.1 C) 98.9 F (37.2 C) 97.9 F (36.6 C)   TempSrc:  Oral Oral   SpO2: 97% 91% 100%   Weight:    61.3 kg  Height:        Intake/Output Summary (Last 24 hours) at 07/23/2023 0816 Last data filed at 07/23/2023 0347 Gross per 24 hour  Intake 0 ml  Output 950 ml  Net -950 ml   Filed Weights   07/20/23 0500 07/21/23 0451 07/23/23 0500  Weight: 58.9 kg 60.2 kg 61.3 kg    Physical Exam:  General: awake, alert, NAD HEENT: dry mucuous membranes mouth, hearing grossly normal Respiratory: normal WOB. No wheezing, Shallow breathing Cardiovascular: quick capillary refill, normal S1/S2, RRR, no JVD, murmurs Gastrointestinal: soft, NT, ND Nervous: Alert, able to follow commands. Minimally conversant Extremities: moves all equally, no edema, normal tone Skin: dry, intact, normal temperature, normal color. No rashes,  lesions or ulcers on exposed skin Psychiatry: flat mood, congruent affect  Labs   I have personally reviewed the following labs and imaging studies CBC    Component Value Date/Time   WBC 6.9 07/20/2023 0616   RBC 5.36 07/20/2023 0616   HGB 15.8 07/20/2023 0616   HGB 13.8 04/02/2023 1005   HGB 14.3 12/08/2013 1530   HCT 47.8 07/20/2023 0616   HCT 44.5 12/08/2013 1530   PLT 216 07/20/2023 0616   PLT 213 04/02/2023 1005   PLT 237 12/08/2013  1530   MCV 89.2 07/20/2023 0616   MCV 94 12/08/2013 1530   MCH 29.5 07/20/2023 0616   MCHC 33.1 07/20/2023 0616   RDW 14.4 07/20/2023 0616   RDW 14.8 (H) 12/08/2013 1530   LYMPHSABS 0.4 (L) 07/12/2023 0812   LYMPHSABS 1.4 12/08/2013 1530   MONOABS 0.5 07/12/2023 0812   MONOABS 0.5 12/08/2013 1530   EOSABS 0.3 07/12/2023 0812   EOSABS 0.3 12/08/2013 1530   BASOSABS 0.1 07/12/2023 0812   BASOSABS 0.1 12/08/2013 1530      Latest Ref Rng & Units 07/20/2023    6:16 AM 07/17/2023    4:57 AM 07/16/2023    4:15 AM  BMP  Glucose 70 - 99 mg/dL 161  93  72   BUN 8 - 23 mg/dL 27  21  29    Creatinine 0.61 - 1.24 mg/dL 0.96  0.45  4.09   Sodium 135 - 145 mmol/L 148  145  152   Potassium 3.5 - 5.1 mmol/L 4.1  3.7  4.2   Chloride 98 - 111 mmol/L 108  105  109   CO2 22 - 32 mmol/L 29  32  32   Calcium 8.9 - 10.3 mg/dL 9.9  9.1  9.6     No results found.  Disposition Plan & Communication  Patient status: Inpatient  Admitted From: Group home Planned disposition location: Group home Anticipated discharge date: TBD pending respiratory improvement   Family Communication: sister and cousin at bedside    Author: Leeroy Bock, DO [facility] 07/23/2023, 8:16 AM   Available by Epic secure chat 7AM-7PM. If 7PM-7AM, please contact night-coverage.  [facility] contact information found on [email or web address].

## 2023-07-23 NOTE — Plan of Care (Signed)
  Problem: Respiratory: Goal: Ability to maintain a clear airway and adequate ventilation will improve Outcome: Progressing   

## 2023-07-24 DIAGNOSIS — J9601 Acute respiratory failure with hypoxia: Secondary | ICD-10-CM | POA: Diagnosis not present

## 2023-07-24 NOTE — Plan of Care (Signed)
  Problem: Activity: Goal: Ability to tolerate increased activity will improve Outcome: Progressing   Problem: Respiratory: Goal: Ability to maintain a clear airway and adequate ventilation will improve Outcome: Progressing   Problem: Role Relationship: Goal: Method of communication will improve Outcome: Progressing   Problem: Education: Goal: Knowledge of General Education information will improve Description: Including pain rating scale, medication(s)/side effects and non-pharmacologic comfort measures Outcome: Progressing   Problem: Health Behavior/Discharge Planning: Goal: Ability to manage health-related needs will improve Outcome: Progressing   Problem: Clinical Measurements: Goal: Ability to maintain clinical measurements within normal limits will improve Outcome: Progressing Goal: Will remain free from infection Outcome: Progressing Goal: Diagnostic test results will improve Outcome: Progressing Goal: Respiratory complications will improve Outcome: Progressing Goal: Cardiovascular complication will be avoided Outcome: Progressing   Problem: Activity: Goal: Risk for activity intolerance will decrease Outcome: Progressing   Problem: Nutrition: Goal: Adequate nutrition will be maintained Outcome: Progressing   Problem: Coping: Goal: Level of anxiety will decrease Outcome: Progressing   Problem: Elimination: Goal: Will not experience complications related to bowel motility Outcome: Progressing Goal: Will not experience complications related to urinary retention Outcome: Progressing   Problem: Pain Managment: Goal: General experience of comfort will improve and/or be controlled Outcome: Progressing   Problem: Safety: Goal: Ability to remain free from injury will improve Outcome: Progressing   Problem: Skin Integrity: Goal: Risk for impaired skin integrity will decrease Outcome: Progressing   Problem: Education: Goal: Ability to describe self-care  measures that may prevent or decrease complications (Diabetes Survival Skills Education) will improve Outcome: Progressing Goal: Individualized Educational Video(s) Outcome: Progressing   Problem: Coping: Goal: Ability to adjust to condition or change in health will improve Outcome: Progressing   Problem: Fluid Volume: Goal: Ability to maintain a balanced intake and output will improve Outcome: Progressing   Problem: Health Behavior/Discharge Planning: Goal: Ability to identify and utilize available resources and services will improve Outcome: Progressing Goal: Ability to manage health-related needs will improve Outcome: Progressing   Problem: Metabolic: Goal: Ability to maintain appropriate glucose levels will improve Outcome: Progressing   Problem: Nutritional: Goal: Maintenance of adequate nutrition will improve Outcome: Progressing Goal: Progress toward achieving an optimal weight will improve Outcome: Progressing   Problem: Skin Integrity: Goal: Risk for impaired skin integrity will decrease Outcome: Progressing   Problem: Tissue Perfusion: Goal: Adequacy of tissue perfusion will improve Outcome: Progressing

## 2023-07-24 NOTE — Progress Notes (Signed)
 Patient scored yellow MEWS due to respirations being 35 as charted by RT. Rechecked respirations and they were at 24. Will recheck vitals in 2 hours per yellow MEWS. Notified charge nurse and will continue to monitor.

## 2023-07-24 NOTE — Progress Notes (Signed)
 Progress Note   [name]:[id number] DOB: 12/22/1955 DOA: 07/12/2023     12 DOS: the patient was seen and examined on 07/24/2023   Brief hospital course:  "Jeremiah Nguyen is a 68 year old male with a past medical history significant for COPD, alcohol dependence, and seizure disorder who presented to [facility] ED on 07/12/2023 from his group home due to altered mental status.  Per report he has been altered over the last 24 hours.   On arrival to the ED he remained somnolent and altered, oriented only to self.  Given his respiratory distress he was placed on BiPAP and eventually required intubation and levophed.  Positive for influenza A. Started on tamiflu.    07/15/23- patient passed SBT today.  He is liberated from MV on to [facility]. Remains with encephalopathy.  07/16/23- patient is improved overnight. He is on home psychiatric medications now.    07/23/23 -continue to wean O2"   I assumed care on 07/24/2023.  Further hospital course and management as outlined below.   Assessment and Plan:  Severe Acute Hypoxic and Hypercapnic Respiratory Failure Influenza positive- S/p intubation. S/p tamiflu.  Antibiotics completed. Resp culture- multi-species, Bcx NGTD, MRSA swab neg Very slow to wean down oxygen since admission. Remains on HHFNC oxygen at 40 L/min, 75 >> 60% FiO2 today Work of breathing appears normal today. --RT following to guide weaning --Continue IV steroids --Continue supportive care with IS & flutter, nebulizer therapy, mucolytics  --ICS --wean oxygen as tolerated, target sats > 90%    Acute metabolic encephalopathy - due to Hypoxia and septic shock- improving.  Oriented to self, place, month. Mitts have been removed - PT/OT --Delirium precautions --Minimize any sedating meds   Septic Shock - POA, now resolved Due to Flu, pneumonia.  Blood cultures negative. Hemodynamically stable Completed Tamiflu --Monitor clinically' --Weaning oxygen as above      Acute Kidney Injury - resolved.   Acute urinary  - resolved. Foley removed. --Avoid nephrotoxic agents --Follow urine output --Monitor BMP   H/o seizures -  --Continue home meds, lamictal   HTN - currently with soft/low BP's Chronic HFrEF - compensated --Decreased dose of metoprolol - continue with hold parameter for MAP<65 --Monitor volume status   Schizophrenia --Continue home Thorazine - continue sertraline       Subjective: Pt awake resting in bed this AM.  Trying to reposition his pillows by HF O2 tubing in the way.  He denies SOB or other complaints.    Physical Exam: Vitals:   07/24/23 0839 07/24/23 1130 07/24/23 1412 07/24/23 1425  BP: (!) 89/50 (!) 95/57    Pulse: 99 100    Resp: 20 20    Temp: 98.4 F (36.9 C) 98.3 F (36.8 C)    TempSrc:      SpO2: 100% 99% 96% 93%  Weight:      Height:       General exam: awake, alert, no acute distress HEENT: moist mucus membranes, hearing grossly normal  Respiratory system: CTAB on heated HFNC at 40 L/min, no wheezes or rhonchi, normal respiratory effort at rest, minimal to no accessory muscle use. Cardiovascular system: normal S1/S2, RRR, no pedal edema.   Gastrointestinal system: soft, NT, ND, no HSM felt, +bowel sounds. Central nervous system: no gross focal neurologic deficits Extremities: moves all, no edema, normal tone Skin: dry, intact, normal temperature Psychiatry: normal mood, flat affect   Data Reviewed:  Notable labs --  Glucose 106 otherwise normal BMP  Last CBC  3/1 normal   Family Communication: none present. Will attempt to call as time allows.  Disposition: Status is: Inpatient Remains inpatient appropriate because: ongoing high oxygen requirements, remains on heated HF O2    Planned Discharge Destination: Skilled nursing facility    Time spent: 45 minutes  Author: Pennie Banter, DO 07/24/2023 4:15 PM  For on call review [email or web address].

## 2023-07-24 NOTE — TOC Progression Note (Signed)
 Transition of Care [facility]) - Progression Note    Patient Details  Name: Jeremiah Nguyen MRN: [id number] Date of Birth: 02-02-1956  Transition of Care [facility]) CM/SW Contact  Truddie Hidden, RN Phone Number: 07/24/2023, 12:45 PM  Clinical Narrative:    TOC continuing to follow patient's progress throughout discharge planning.        Expected Discharge Plan and Services                                               Social Determinants of Health (SDOH) Interventions SDOH Screenings   Food Insecurity: Patient Unable To Answer (07/13/2023)  Housing: Patient Unable To Answer (07/13/2023)  Transportation Needs: Patient Unable To Answer (07/13/2023)  Utilities: Patient Unable To Answer (07/13/2023)  Depression (PHQ2-9): Low Risk  (02/25/2019)  Financial Resource Strain: Low Risk  (06/30/2018)  Physical Activity: Inactive (06/30/2018)  Social Connections: Patient Unable To Answer (07/13/2023)  Stress: Stress Concern Present (06/30/2018)  Tobacco Use: High Risk (07/12/2023)    Readmission Risk Interventions     No data to display

## 2023-07-24 NOTE — Progress Notes (Signed)
 Patient is alert and oriented x4. While rounding and obtaining vitals, noticed that patient's oxygen saturation was normal at 97%, but his effort to breathe had increased which raised a concern. Performed manual respirations and got 24. Patient is using his abdomen and chest to breath. He is not agonal but definitely has an increased effort from when I saw him last night/this morning. Called respiratory to report concern and they will come to evaluate him.

## 2023-07-25 DIAGNOSIS — J9601 Acute respiratory failure with hypoxia: Secondary | ICD-10-CM | POA: Diagnosis not present

## 2023-07-25 NOTE — Progress Notes (Signed)
 Nutrition Follow-up  DOCUMENTATION CODES:   Non-severe (moderate) malnutrition in context of chronic illness  INTERVENTION:   -Continue regular diet -Continue Ensure Enlive po TID, each supplement provides 350 kcal and 20 grams of protein -Continue Magic cup TID with meals, each supplement provides 290 kcal and 9 grams of protein  -Continue MVI with minerals daily -Continue 1 mg folic acid daily -Continue 409 mg thiamine daily  NUTRITION DIAGNOSIS:   Moderate Malnutrition related to chronic illness as evidenced by severe fat depletion, severe muscle depletion.  Ongoing  GOAL:   Patient will meet greater than or equal to 90% of their needs  Progressing   MONITOR:   PO intake, Supplement acceptance, Labs, Weight trends, I & O's, Skin  REASON FOR ASSESSMENT:   Ventilator    ASSESSMENT:   68 y/o male with h/o seizures, etoh abuse, GERD, liver disease, OSA, COPD and resides in a group home who is admitted with acute metabolic encephalopathy, severe sepsis with septic shock, AKI, acute hypoxic and hypercapnic respiratory failure in the setting of acute COPD exacerbation due to influenza A infection and superimposed bacterial pneumonia requiring intubation and mechanical ventilation.  Reviewed I/O's: -1.7 L x 24 hours and -8.7 L since admission   Spoke with pt at bedside, who was on [facility] at time of visit. Pt reports feeling better today. Pt with flat affect and not as animated in comparision to prior visit. He reports he has a good appetite and is trying to eat, but "I don't like some of the food y'all send". RD asked pt to elaborate on what foods he did or didn't like, but pt would not provide examples. Informed RD that he was on a regular diet and that he did not have any men restrictions. Documented meal completions 40-50%.   Pt reports consuming Ensure supplements and likes them. RD discussed importance of good meal and supplement intake to promote healing.   Wt has been  stable over the past week.   Medications reviewed and include folic acid, solu-medrol, protonix, miralax, and thiamine.   Per MD notes, plan to discharge back to group home once respiratory status improves. TOC following for discharge needs.   Labs reviewed: CBGS: 104-229 (inpatient orders for glycemic control are none).    Diet Order:   Diet Order             Diet regular Room service appropriate? Yes; Fluid consistency: Thin  Diet effective now                   EDUCATION NEEDS:   No education needs have been identified at this time  Skin:  Skin Assessment: Reviewed RN Assessment  Last BM:  07/22/23 (type 6)  Height:   Ht Readings from Last 1 Encounters:  07/12/23 5' 7.99" (1.727 m)    Weight:   Wt Readings from Last 1 Encounters:  07/25/23 60 kg    Ideal Body Weight:  70 kg  BMI:  Body mass index is 20.12 kg/m.  Estimated Nutritional Needs:   Kcal:  1900-2200kcal/day  Protein:  95-110g/day  Fluid:  1.8-2.1L/day    Levada Schilling, RD, LDN, CDCES Registered Dietitian III Certified Diabetes Care and Education Specialist If unable to reach this RD, please use "RD Inpatient" group chat on secure chat between hours of 8am-4 pm daily

## 2023-07-25 NOTE — Progress Notes (Signed)
 Progress Note   [name] ZOX:[id number] DOB: 09/26/1955 DOA: 07/12/2023     13 DOS: the patient was seen and examined on 07/25/2023   Brief hospital course:  "Jeremiah Nguyen is a 68 year old male with a past medical history significant for COPD, alcohol dependence, and seizure disorder who presented to [facility] ED on 07/12/2023 from his group home due to altered mental status.  Per report he has been altered over the last 24 hours.   On arrival to the ED he remained somnolent and altered, oriented only to self.  Given his respiratory distress he was placed on BiPAP and eventually required intubation and levophed.  Positive for influenza A. Started on tamiflu.    07/15/23- patient passed SBT today.  He is liberated from MV on to [facility]. Remains with encephalopathy.  07/16/23- patient is improved overnight. He is on home psychiatric medications now.    07/23/23 -continue to wean O2"   I assumed care on 07/24/2023.  Further hospital course and management as outlined below.   Assessment and Plan:  Severe Acute Hypoxic and Hypercapnic Respiratory Failure Influenza positive- S/p intubation. S/p tamiflu.  Antibiotics completed. Resp culture- multi-species, Bcx NGTD, MRSA swab neg Very slow to wean down oxygen since admission. Remains on HHFNC oxygen at 40 L/min, 75 >> 60% FiO2 today Work of breathing appears normal today. --RT following to guide weaning --Continue IV steroids --Continue supportive care with IS & flutter, nebulizer therapy, mucolytics  --ICS --wean oxygen as tolerated, target sats > 90%    Acute metabolic encephalopathy - due to Hypoxia and septic shock- improving.  Oriented to self, place, month. Mitts have been removed - PT/OT --Delirium precautions --Minimize any sedating meds   Septic Shock - POA, now resolved Due to Flu, pneumonia.  Blood cultures negative. Hemodynamically stable Completed Tamiflu --Monitor clinically' --Weaning oxygen as above      Acute Kidney Injury - resolved.   Acute urinary  - resolved. Foley removed. --Avoid nephrotoxic agents --Follow urine output --Monitor BMP   H/o seizures -  --Continue home meds, lamictal   HTN - currently with soft/low BP's Chronic HFrEF - compensated --Decreased dose of metoprolol - continue with hold parameter for MAP<65 --Monitor volume status   Schizophrenia --Continue home Thorazine - continue sertraline       Subjective: Pt awake resting in bed this AM.  Remains on heated HF O2.  Denies feeling short of breath or other acute complaints.     Physical Exam: Vitals:   07/25/23 1443 07/25/23 1447 07/25/23 1601 07/25/23 1646  BP:   118/72 (!) 107/36  Pulse:   (!) 109 (!) 118  Resp:      Temp:   (!) 97.2 F (36.2 C)   TempSrc:      SpO2: 94% 95% 96% 95%  Weight:      Height:       General exam: awake, alert, no acute distress HEENT: moist mucus membranes, hearing grossly normal  Respiratory system: CTAB on heated HFNC at 40 L/min, no wheezes or rhonchi, normal respiratory effort at rest, minimal to no accessory muscle use. Cardiovascular system: normal S1/S2, RRR, no pedal edema.   Gastrointestinal system: soft, NT, ND Central nervous system: no gross focal neurologic deficits Extremities: moves all, no edema, normal tone Psychiatry: normal mood, flat affect   Data Reviewed:  No new labs today  Notable labs 3/4 --  Glucose 106 otherwise normal BMP  Last CBC 3/1 normal   Family Communication: none present.  Will attempt to call as time allows.  Disposition: Status is: Inpatient Remains inpatient appropriate because: ongoing high oxygen requirements, remains on heated HF O2    Planned Discharge Destination: Skilled nursing facility vs ?LTAC    Time spent: 38 minutes  Author: Pennie Banter, DO 07/25/2023 4:50 PM  For on call review [email or web address].

## 2023-07-25 NOTE — Progress Notes (Signed)
 PT Cancellation Note  Patient Details Name: Jeremiah Nguyen MRN: [id number] DOB: 1955/05/24   Cancelled Treatment:     PT attempt. Pt was long sitting in bed upon arrival with supportive first cousin at bedside. Pt's is agreeable to OOB activity however once he sat up EOB, he begin throwing up out his nose and mouth. Per pt'c cousin," He just ate two cheeseburgers."  PT will continue to follow and return when pt is not so nauseous/ vomiting. Per chart, looks to have weaned down on O2 significantly.     Rushie Chestnut 07/25/2023, 3:55 PM

## 2023-07-25 NOTE — Plan of Care (Signed)
  Problem: Activity: Goal: Ability to tolerate increased activity will improve Outcome: Progressing   Problem: Respiratory: Goal: Ability to maintain a clear airway and adequate ventilation will improve Outcome: Progressing   Problem: Role Relationship: Goal: Method of communication will improve Outcome: Progressing   Problem: Education: Goal: Knowledge of General Education information will improve Description: Including pain rating scale, medication(s)/side effects and non-pharmacologic comfort measures Outcome: Progressing   Problem: Health Behavior/Discharge Planning: Goal: Ability to manage health-related needs will improve Outcome: Progressing   Problem: Clinical Measurements: Goal: Ability to maintain clinical measurements within normal limits will improve Outcome: Progressing Goal: Will remain free from infection Outcome: Progressing Goal: Diagnostic test results will improve Outcome: Progressing Goal: Respiratory complications will improve Outcome: Progressing Goal: Cardiovascular complication will be avoided Outcome: Progressing   Problem: Activity: Goal: Risk for activity intolerance will decrease Outcome: Progressing   Problem: Nutrition: Goal: Adequate nutrition will be maintained Outcome: Progressing   Problem: Coping: Goal: Level of anxiety will decrease Outcome: Progressing   Problem: Elimination: Goal: Will not experience complications related to bowel motility Outcome: Progressing Goal: Will not experience complications related to urinary retention Outcome: Progressing   Problem: Pain Managment: Goal: General experience of comfort will improve and/or be controlled Outcome: Progressing   Problem: Safety: Goal: Ability to remain free from injury will improve Outcome: Progressing   Problem: Skin Integrity: Goal: Risk for impaired skin integrity will decrease Outcome: Progressing   Problem: Education: Goal: Ability to describe self-care  measures that may prevent or decrease complications (Diabetes Survival Skills Education) will improve Outcome: Progressing Goal: Individualized Educational Video(s) Outcome: Progressing   Problem: Coping: Goal: Ability to adjust to condition or change in health will improve Outcome: Progressing   Problem: Fluid Volume: Goal: Ability to maintain a balanced intake and output will improve Outcome: Progressing   Problem: Health Behavior/Discharge Planning: Goal: Ability to identify and utilize available resources and services will improve Outcome: Progressing Goal: Ability to manage health-related needs will improve Outcome: Progressing   Problem: Metabolic: Goal: Ability to maintain appropriate glucose levels will improve Outcome: Progressing   Problem: Nutritional: Goal: Maintenance of adequate nutrition will improve Outcome: Progressing Goal: Progress toward achieving an optimal weight will improve Outcome: Progressing   Problem: Skin Integrity: Goal: Risk for impaired skin integrity will decrease Outcome: Progressing   Problem: Tissue Perfusion: Goal: Adequacy of tissue perfusion will improve Outcome: Progressing

## 2023-07-25 NOTE — TOC Progression Note (Addendum)
 Transition of Care [facility]) - Progression Note    Patient Details  Name: Jeremiah Nguyen MRN: [id number] Date of Birth: 11/21/1955  Transition of Care [facility]) CM/SW Contact  Truddie Hidden, RN Phone Number: 07/25/2023, 4:20 PM  Clinical Narrative:    Attempt to reach patient's sister, [name] regarding LTACH. No answer. Left a message.   4:22 Referral for LTACH screening sent to [facility] from [facility]. Per Consuello Closs she will look to see if patient meets criteria for admission.          Expected Discharge Plan and Services                                               Social Determinants of Health (SDOH) Interventions SDOH Screenings   Food Insecurity: Patient Unable To Answer (07/13/2023)  Housing: Patient Unable To Answer (07/13/2023)  Transportation Needs: Patient Unable To Answer (07/13/2023)  Utilities: Patient Unable To Answer (07/13/2023)  Depression (PHQ2-9): Low Risk  (02/25/2019)  Financial Resource Strain: Low Risk  (06/30/2018)  Physical Activity: Inactive (06/30/2018)  Social Connections: Patient Unable To Answer (07/13/2023)  Stress: Stress Concern Present (06/30/2018)  Tobacco Use: High Risk (07/12/2023)    Readmission Risk Interventions     No data to display

## 2023-07-26 DIAGNOSIS — J9601 Acute respiratory failure with hypoxia: Secondary | ICD-10-CM | POA: Diagnosis not present

## 2023-07-26 MED ORDER — ONDANSETRON HCL 4 MG/2ML IJ SOLN
4.0000 mg | Freq: Four times a day (QID) | INTRAMUSCULAR | Status: DC | PRN
  Administered 2023-07-27 – 2023-07-31 (×3): 4 mg via INTRAVENOUS
  Filled 2023-07-26 (×4): qty 2

## 2023-07-26 NOTE — Progress Notes (Signed)
 Occupational Therapy Treatment Patient Details Name: Jeremiah Nguyen MRN: [id number] DOB: 07/29/1955 Today's Date: 07/26/2023   History of present illness Patient is a 68 year old male with history of COPD, alcohol dependence, and seizure disorder who presented on 07/12/2023 from group home due to altered mental status. Positive for influenza A, required mechanical ventilation, now on HHFNC.   OT comments  Chart reviewed to date, pt greeted in room, oriented to self and place, stating multiple times "I need to get out of here". Pt follows one step commands with increased time, multi modal cues. Improvements noted on this date with pt performing supine>sit with MOD A, STS with MIN A +1 with RW, static standing was fair, then progressed to poor with fatigue. Pt took three lateral steps up the bed with MIN A+1, attempted to progress to chair but pt with posterior lean and reporting "no" to sitting in chair Additional STS attempted with MIN A but pt limited in further progressing of amb away from bed. MOD A required for LB dressing (improvement from previous sessions). Pt is making progress towards goals, discharge recommendation remains appropriate. OT will continue to follow.       If plan is discharge home, recommend the following:  Help with stairs or ramp for entrance;Supervision due to cognitive status;A lot of help with walking and/or transfers;A lot of help with bathing/dressing/bathroom   Equipment Recommendations  Other (comment) (defer)    Recommendations for Other Services      Precautions / Restrictions Precautions Precautions: Fall Recall of Precautions/Restrictions: Impaired Precaution/Restrictions Comments: monitor Sp02 Restrictions Weight Bearing Restrictions Per Provider Order: No       Mobility Bed Mobility Overal bed mobility: Needs Assistance Bed Mobility: Supine to Sit, Sit to Supine     Supine to sit: Mod assist, HOB elevated Sit to supine: Max assist   General  bed mobility comments: for BLE management    Transfers Overall transfer level: Needs assistance Equipment used: Rolling walker (2 wheels) Transfers: Sit to/from Stand Sit to Stand: Min assist (two attempts from regular bed height)           General transfer comment: frequent multi modal cues required, pt takes lateral steps up the bed with MIN A +1 with RW, attempt to further amb but pt with posterior lean and ultimately required sitting on edge of bed     Balance Overall balance assessment: Needs assistance Sitting-balance support: Feet supported Sitting balance-Leahy Scale: Good   Postural control: Posterior lean Standing balance support: Bilateral upper extremity supported Standing balance-Leahy Scale: Poor Standing balance comment: Fair, then poor with fatigue                           ADL either performed or assessed with clinical judgement   ADL Overall ADL's : Needs assistance/impaired     Grooming: Bed level;Wash/dry face;Moderate assistance               Lower Body Dressing: Moderate assistance Lower Body Dressing Details (indicate cue type and reason): donn/doff socks                    Extremity/Trunk Assessment              Vision       Perception     Praxis     Communication Communication Communication: Impaired Factors Affecting Communication: Difficulty expressing self;Reduced clarity of speech   Cognition Arousal: Alert Behavior During Therapy:  Restless Cognition: Cognition impaired, History of cognitive impairments   Orientation impairments: Situation, Time Awareness: Online awareness impaired, Intellectual awareness impaired Memory impairment (select all impairments): Short-term memory, Working [employer] fast streamer, Conservation officer, historic buildings, Non-declarative long-term memory Attention impairment (select first level of impairment): Sustained attention Executive functioning impairment (select all impairments): Initiation,  Organization, Sequencing, Reasoning, Problem solving OT - Cognition Comments: continued impaired insight into current situation- pt reports he wants to go home                 Following commands: Impaired Following commands impaired: Follows one step commands with increased time      Cueing   Cueing Techniques: Verbal cues, Tactile cues, Visual cues  Exercises Other Exercises Other Exercises: edu re: role of OT, role of rehab, importance of progressing mobility    Shoulder Instructions       General Comments pt spo2 >90% on 8 L via HFNC throughout    Pertinent Vitals/ Pain       Pain Assessment Pain Assessment: No/denies pain  Home Living                                          Prior Functioning/Environment              Frequency  Min 2X/week        Progress Toward Goals  OT Goals(current goals can now be found in the care plan section)  Progress towards OT goals: Progressing toward goals  Acute Rehab OT Goals Time For Goal Achievement: 08/01/23  Plan      Co-evaluation                 AM-PAC OT "6 Clicks" Daily Activity     Outcome Measure   Help from another person eating meals?: A Little Help from another person taking care of personal grooming?: A Little Help from another person toileting, which includes using toliet, bedpan, or urinal?: A Lot Help from another person bathing (including washing, rinsing, drying)?: A Lot Help from another person to put on and taking off regular upper body clothing?: A Little Help from another person to put on and taking off regular lower body clothing?: A Lot 6 Click Score: 15    End of Session Equipment Utilized During Treatment: Gait belt;Oxygen;Rolling walker (2 wheels)  OT Visit Diagnosis: Other abnormalities of gait and mobility (R26.89);Muscle weakness (generalized) (M62.81)   Activity Tolerance Patient tolerated treatment well   Patient Left in bed;with call bell/phone  within reach;with bed alarm set   Nurse Communication Mobility status        Time: 1027-2536 OT Time Calculation (min): 16 min  Charges: OT General Charges $OT Visit: 1 Visit OT Treatments $Therapeutic Activity: 8-22 mins Oleta Mouse, OTD OTR/L  07/26/23, 3:45 PM

## 2023-07-26 NOTE — Progress Notes (Signed)
 Progress Note   [name] Nguyen DOB: 1955-08-26 DOA: 07/12/2023     14 DOS: the patient was seen and examined on 07/26/2023   Brief hospital course:  "Jeremiah Nguyen is a 68 year old male with a past medical history significant for COPD, alcohol dependence, and seizure disorder who presented to [facility] ED on 07/12/2023 from his group home due to altered mental status.  Per report he has been altered over the last 24 hours.   On arrival to the ED he remained somnolent and altered, oriented only to self.  Given his respiratory distress he was placed on BiPAP and eventually required intubation and levophed.  Positive for influenza A. Started on tamiflu.    07/15/23- patient passed SBT today.  He is liberated from MV on to [facility]. Remains with encephalopathy.  07/16/23- patient is improved overnight. He is on home psychiatric medications now.    07/23/23 -continue to wean O2"   I assumed care on 07/24/2023.  Further hospital course and management as outlined below.   Assessment and Plan:  Severe Acute Hypoxic and Hypercapnic Respiratory Failure Influenza positive- S/p intubation. S/p tamiflu.  Antibiotics completed. Resp culture- multi-species, Bcx NGTD, MRSA swab neg Very slow to wean down oxygen since admission. Remains on HHFNC oxygen at 40 L/min, 75 >> 60% FiO2 today Work of breathing appears normal today. --RT following, weaning down oxygen --Treated with IV steroids, completed --Supportive care with IS & flutter, nebulizer therapy, mucolytics, ICS --wean oxygen as tolerated, target sats > 90%   Acute metabolic encephalopathy - due to Hypoxia and septic shock- improving.  Oriented to self, place, month. Mitts have been removed --PT/OT --Delirium precautions --Minimize any sedating meds   Septic Shock - POA, now resolved Due to Flu, pneumonia.  Blood cultures negative. Hemodynamically stable Completed Tamiflu --Monitor clinically --Weaning oxygen as above      Acute Kidney Injury - resolved.   Acute urinary  - resolved. Foley removed. --Avoid nephrotoxic agents --Follow urine output --Monitor BMP   H/o seizures -  --Continue home meds, lamictal   HTN - currently with soft/low BP's Chronic HFrEF - compensated --Decreased dose of metoprolol - continue with hold parameter for MAP<65 --Monitor volume status   Schizophrenia --Continue home Thorazine - continue sertraline       Subjective: Pt sleeping, woke to voice this AM.  Denies complaints. Later this AM, RN reported pt vomited once.  No other acute complaints or events reported.     Physical Exam: Vitals:   07/26/23 0436 07/26/23 0500 07/26/23 0726 07/26/23 0840  BP: (!) 111/54   (!) 102/49  Pulse: 86   97  Resp: 20     Temp: 98.2 F (36.8 C)   98.5 F (36.9 C)  TempSrc: Oral     SpO2: 100%  96% 98%  Weight:  60.6 kg    Height:       General exam: sleeping, wakes to voice , no acute distress HEENT: moist mucus membranes, hearing grossly normal  Respiratory system: CTAB on 10 L/min HFNC off heated HF O2, no wheezes or rhonchi, normal respiratory effort at rest, minimal to no accessory muscle use. Cardiovascular system: normal S1/S2, RRR, no pedal edema.   Gastrointestinal system: soft, NT, ND Central nervous system: no gross focal neurologic deficits Extremities: moves all, no edema, normal tone Psychiatry: normal mood, flat affect   Data Reviewed:  No new labs today  Notable labs 3/4 --  Glucose 106 otherwise normal BMP  Last CBC  3/1 normal   Family Communication: none present. Will attempt to call as time allows.  Disposition: Status is: Inpatient Remains inpatient appropriate because: weaning off oxygen    Planned Discharge Destination: Skilled nursing facility vs ?LTAC    Time spent: 35 minutes  Author: Pennie Banter, DO 07/26/2023 12:16 PM  For on call review [email or web address].

## 2023-07-26 NOTE — TOC Progression Note (Signed)
 Transition of Care [facility]) - Progression Note    Patient Details  Name: Jeremiah Nguyen MRN: [id number] Date of Birth: Jan 19, 1956  [facility]) CM/SW Contact  Truddie Hidden, RN Phone Number: 07/26/2023, 9:53 AM  Clinical Narrative:    Retrieved message from [facility] stating patient meets criteria for admission pending [facility] and insurance approval. MD notified.   Attempt to contact patient's sister, [name]. No answer. Unable to leave a message.         Expected Discharge Plan and Services                                               Social Determinants of Health (SDOH) Interventions SDOH Screenings   Food Insecurity: Patient Unable To Answer (07/13/2023)  Housing: Patient Unable To Answer (07/13/2023)  Transportation Needs: Patient Unable To Answer (07/13/2023)  Utilities: Patient Unable To Answer (07/13/2023)  Depression (PHQ2-9): Low Risk  (02/25/2019)  Financial Resource Strain: Low Risk  (06/30/2018)  Physical Activity: Inactive (06/30/2018)  Social Connections: Patient Unable To Answer (07/13/2023)  Stress: Stress Concern Present (06/30/2018)  Tobacco Use: High Risk (07/12/2023)    Readmission Risk Interventions     No data to display

## 2023-07-26 NOTE — Plan of Care (Signed)
  Problem: Activity: Goal: Ability to tolerate increased activity will improve Outcome: Progressing   Problem: Respiratory: Goal: Ability to maintain a clear airway and adequate ventilation will improve Outcome: Progressing   Problem: Role Relationship: Goal: Method of communication will improve Outcome: Progressing   Problem: Education: Goal: Knowledge of General Education information will improve Description: Including pain rating scale, medication(s)/side effects and non-pharmacologic comfort measures Outcome: Progressing   Problem: Health Behavior/Discharge Planning: Goal: Ability to manage health-related needs will improve Outcome: Progressing   Problem: Clinical Measurements: Goal: Ability to maintain clinical measurements within normal limits will improve Outcome: Progressing Goal: Will remain free from infection Outcome: Progressing Goal: Diagnostic test results will improve Outcome: Progressing Goal: Respiratory complications will improve Outcome: Progressing Goal: Cardiovascular complication will be avoided Outcome: Progressing   Problem: Activity: Goal: Risk for activity intolerance will decrease Outcome: Progressing   Problem: Nutrition: Goal: Adequate nutrition will be maintained Outcome: Progressing   Problem: Coping: Goal: Level of anxiety will decrease Outcome: Progressing   Problem: Elimination: Goal: Will not experience complications related to bowel motility Outcome: Progressing Goal: Will not experience complications related to urinary retention Outcome: Progressing   Problem: Pain Managment: Goal: General experience of comfort will improve and/or be controlled Outcome: Progressing   Problem: Safety: Goal: Ability to remain free from injury will improve Outcome: Progressing   Problem: Skin Integrity: Goal: Risk for impaired skin integrity will decrease Outcome: Progressing   Problem: Education: Goal: Ability to describe self-care  measures that may prevent or decrease complications (Diabetes Survival Skills Education) will improve Outcome: Progressing Goal: Individualized Educational Video(s) Outcome: Progressing   Problem: Coping: Goal: Ability to adjust to condition or change in health will improve Outcome: Progressing   Problem: Fluid Volume: Goal: Ability to maintain a balanced intake and output will improve Outcome: Progressing   Problem: Health Behavior/Discharge Planning: Goal: Ability to identify and utilize available resources and services will improve Outcome: Progressing Goal: Ability to manage health-related needs will improve Outcome: Progressing   Problem: Metabolic: Goal: Ability to maintain appropriate glucose levels will improve Outcome: Progressing   Problem: Nutritional: Goal: Maintenance of adequate nutrition will improve Outcome: Progressing Goal: Progress toward achieving an optimal weight will improve Outcome: Progressing   Problem: Skin Integrity: Goal: Risk for impaired skin integrity will decrease Outcome: Progressing   Problem: Tissue Perfusion: Goal: Adequacy of tissue perfusion will improve Outcome: Progressing

## 2023-07-26 NOTE — Plan of Care (Signed)
  Problem: Activity: Goal: Ability to tolerate increased activity will improve Outcome: Progressing   Problem: Respiratory: Goal: Ability to maintain a clear airway and adequate ventilation will improve Outcome: Progressing   Problem: Education: Goal: Knowledge of General Education information will improve Description: Including pain rating scale, medication(s)/side effects and non-pharmacologic comfort measures Outcome: Progressing   Problem: Health Behavior/Discharge Planning: Goal: Ability to manage health-related needs will improve Outcome: Progressing

## 2023-07-27 DIAGNOSIS — J9601 Acute respiratory failure with hypoxia: Secondary | ICD-10-CM | POA: Diagnosis not present

## 2023-07-27 LAB — BASIC METABOLIC PANEL
Anion gap: 8 (ref 5–15)
BUN: 16 mg/dL (ref 8–23)
CO2: 35 mmol/L — ABNORMAL HIGH (ref 22–32)
Calcium: 9.4 mg/dL (ref 8.9–10.3)
Chloride: 97 mmol/L — ABNORMAL LOW (ref 98–111)
Creatinine, Ser: 0.89 mg/dL (ref 0.61–1.24)
GFR, Estimated: >60 mL/min (ref >60)
Glucose, Bld: 115 mg/dL — ABNORMAL HIGH (ref 70–99)
Potassium: 3.7 mmol/L (ref 3.5–5.1)
Sodium: 140 mmol/L (ref 135–145)

## 2023-07-27 LAB — CBC
HCT: 41.1 % (ref 39.0–52.0)
Hemoglobin: 13.6 g/dL (ref 13.0–17.0)
MCH: 30.2 pg (ref 26.0–34.0)
MCHC: 33.1 g/dL (ref 30.0–36.0)
MCV: 91.1 fL (ref 80.0–100.0)
Platelets: 303 10*3/uL (ref 150–400)
RBC: 4.51 MIL/uL (ref 4.22–5.81)
RDW: 14 % (ref 11.5–15.5)
WBC: 7.2 10*3/uL (ref 4.0–10.5)
nRBC: 0 % (ref 0.0–0.2)

## 2023-07-27 MED ORDER — IPRATROPIUM-ALBUTEROL 0.5-2.5 (3) MG/3ML IN SOLN
3.0000 mL | RESPIRATORY_TRACT | Status: DC | PRN
  Administered 2023-07-27 – 2023-08-09 (×10): 3 mL via RESPIRATORY_TRACT
  Filled 2023-07-27 (×11): qty 3

## 2023-07-27 NOTE — Plan of Care (Signed)
  Problem: Activity: Goal: Ability to tolerate increased activity will improve Outcome: Progressing   Problem: Respiratory: Goal: Ability to maintain a clear airway and adequate ventilation will improve Outcome: Progressing   Problem: Role Relationship: Goal: Method of communication will improve Outcome: Progressing   Problem: Education: Goal: Knowledge of General Education information will improve Description: Including pain rating scale, medication(s)/side effects and non-pharmacologic comfort measures Outcome: Progressing   Problem: Health Behavior/Discharge Planning: Goal: Ability to manage health-related needs will improve Outcome: Progressing   Problem: Clinical Measurements: Goal: Ability to maintain clinical measurements within normal limits will improve Outcome: Progressing Goal: Will remain free from infection Outcome: Progressing Goal: Diagnostic test results will improve Outcome: Progressing Goal: Respiratory complications will improve Outcome: Progressing Goal: Cardiovascular complication will be avoided Outcome: Progressing   Problem: Activity: Goal: Risk for activity intolerance will decrease Outcome: Progressing   Problem: Nutrition: Goal: Adequate nutrition will be maintained Outcome: Progressing   Problem: Coping: Goal: Level of anxiety will decrease Outcome: Progressing   Problem: Elimination: Goal: Will not experience complications related to bowel motility Outcome: Progressing Goal: Will not experience complications related to urinary retention Outcome: Progressing   Problem: Pain Managment: Goal: General experience of comfort will improve and/or be controlled Outcome: Progressing   Problem: Safety: Goal: Ability to remain free from injury will improve Outcome: Progressing   Problem: Skin Integrity: Goal: Risk for impaired skin integrity will decrease Outcome: Progressing   Problem: Education: Goal: Ability to describe self-care  measures that may prevent or decrease complications (Diabetes Survival Skills Education) will improve Outcome: Progressing Goal: Individualized Educational Video(s) Outcome: Progressing   Problem: Coping: Goal: Ability to adjust to condition or change in health will improve Outcome: Progressing   Problem: Fluid Volume: Goal: Ability to maintain a balanced intake and output will improve Outcome: Progressing   Problem: Health Behavior/Discharge Planning: Goal: Ability to identify and utilize available resources and services will improve Outcome: Progressing Goal: Ability to manage health-related needs will improve Outcome: Progressing   Problem: Metabolic: Goal: Ability to maintain appropriate glucose levels will improve Outcome: Progressing   Problem: Nutritional: Goal: Maintenance of adequate nutrition will improve Outcome: Progressing Goal: Progress toward achieving an optimal weight will improve Outcome: Progressing   Problem: Skin Integrity: Goal: Risk for impaired skin integrity will decrease Outcome: Progressing   Problem: Tissue Perfusion: Goal: Adequacy of tissue perfusion will improve Outcome: Progressing

## 2023-07-27 NOTE — Progress Notes (Signed)
 Progress Note   [name]:[id number] DOB: Jun 23, 1955 DOA: 07/12/2023     15 DOS: the patient was seen and examined on 07/27/2023   Brief hospital course:  "Jeremiah Nguyen is a 68 year old male with a past medical history significant for COPD, alcohol dependence, and seizure disorder who presented to [facility] ED on 07/12/2023 from his group home due to altered mental status.  Per report he has been altered over the last 24 hours.   On arrival to the ED he remained somnolent and altered, oriented only to self.  Given his respiratory distress he was placed on BiPAP and eventually required intubation and levophed.  Positive for influenza A. Started on tamiflu.    07/15/23- patient passed SBT today.  He is liberated from MV on to [facility]. Remains with encephalopathy.  07/16/23- patient is improved overnight. He is on home psychiatric medications now.    07/23/23 -continue to wean O2"   I assumed care on 07/24/2023.  Further hospital course and management as outlined below.   Assessment and Plan:  Severe Acute Hypoxic and Hypercapnic Respiratory Failure Influenza positive- S/p intubation. S/p tamiflu.  Antibiotics completed. Resp culture- multi-species, Bcx NGTD, MRSA swab neg Very slow to wean down oxygen since admission. 3/8 -- weaned to 10 L/min HFNC O2 --RT following, weaning down oxygen --Treated with IV steroids, completed --Supportive care with IS & flutter, nebulizer therapy, mucolytics, ICS --wean oxygen as tolerated, target sats > 90%   Acute metabolic encephalopathy - due to Hypoxia and septic shock- improving.  Oriented to self, place, month. Mitts have been removed --PT/OT --Delirium precautions --Minimize any sedating meds   Septic Shock - POA, now resolved Due to Flu, pneumonia.  Blood cultures negative. Hemodynamically stable Completed Tamiflu --Monitor clinically --Weaning oxygen as above     Acute Kidney Injury - resolved.   Acute urinary  - resolved.  Foley removed. --Avoid nephrotoxic agents --Follow urine output --Monitor BMP   H/o seizures -  --Continue home meds, lamictal   HTN - currently with soft/low BP's Chronic HFrEF - compensated --Decreased dose of metoprolol - continue with hold parameter for MAP<65 --Monitor volume status   Schizophrenia --Continue home Thorazine - continue sertraline       Subjective: Pt awake sitting up in bed this AM.  He asks when he'll get his morning meds and asking for ice water. Otherwise denies complaints including SOB, F/C or CP.     Physical Exam: Vitals:   07/26/23 2015 07/27/23 0423 07/27/23 0812 07/27/23 0837  BP:  107/64  139/74  Pulse:  90 92 (!) 102  Resp:   20   Temp:  98.3 F (36.8 C)  (!) 97.3 F (36.3 C)  TempSrc:  Oral    SpO2: 98% 93% 92% 95%  Weight:  62.6 kg    Height:       General exam: awake & alert, wakes to voice , no acute distress HEENT: moist mucus membranes, hearing grossly normal  Respiratory system: CTAB on 10 L/min HFNC off heated HF O2, no wheezes or rhonchi, normal respiratory effort at rest, no accessory muscle use. Cardiovascular system: normal S1/S2, RRR, no pedal edema.   Gastrointestinal system: soft, NT, ND Central nervous system: no gross focal neurologic deficits Extremities: moves all, no edema, normal tone Psychiatry: normal mood, flat affect   Data Reviewed:  Notable labs --  BMP normal except Cl 97, bicarb 35, glucose 115 Normal CBC   Family Communication: none present. Will attempt to  call as time allows.  Disposition: Status is: Inpatient Remains inpatient appropriate because: weaning off oxygen    Planned Discharge Destination: Skilled nursing facility vs ?LTAC    Time spent: 35 minutes  Author: Pennie Banter, DO 07/27/2023 12:22 PM  For on call review [email or web address].

## 2023-07-28 ENCOUNTER — Inpatient Hospital Stay

## 2023-07-28 DIAGNOSIS — E440 Moderate protein-calorie malnutrition: Secondary | ICD-10-CM

## 2023-07-28 DIAGNOSIS — Z515 Encounter for palliative care: Secondary | ICD-10-CM | POA: Diagnosis not present

## 2023-07-28 DIAGNOSIS — J101 Influenza due to other identified influenza virus with other respiratory manifestations: Secondary | ICD-10-CM

## 2023-07-28 DIAGNOSIS — J9601 Acute respiratory failure with hypoxia: Secondary | ICD-10-CM | POA: Diagnosis not present

## 2023-07-28 LAB — RESPIRATORY PANEL BY PCR

## 2023-07-28 LAB — BLOOD GAS, ARTERIAL
Acid-Base Excess: 15.4 mmol/L — ABNORMAL HIGH (ref 0.0–2.0)
Bicarbonate: 41.7 mmol/L — ABNORMAL HIGH (ref 20.0–28.0)
FIO2: 70 %
O2 Saturation: 91.6 %
Patient temperature: 37
pCO2 arterial: 56 mmHg — ABNORMAL HIGH (ref 32–48)
pH, Arterial: 7.48 — ABNORMAL HIGH (ref 7.35–7.45)
pO2, Arterial: 59 mmHg — ABNORMAL LOW (ref 83–108)

## 2023-07-28 LAB — LACTIC ACID, PLASMA: Lactic Acid, Venous: 0.8 mmol/L (ref 0.5–1.9)

## 2023-07-28 LAB — GLUCOSE, CAPILLARY: Glucose-Capillary: 134 mg/dL — ABNORMAL HIGH (ref 70–99)

## 2023-07-28 LAB — SARS CORONAVIRUS 2 BY RT PCR: SARS Coronavirus 2 by RT PCR: NEGATIVE

## 2023-07-28 LAB — BRAIN NATRIURETIC PEPTIDE: B Natriuretic Peptide: 32.8 pg/mL (ref 0.0–100.0)

## 2023-07-28 MED ORDER — CHLORHEXIDINE GLUCONATE CLOTH 2 % EX PADS
6.0000 | MEDICATED_PAD | Freq: Every day | CUTANEOUS | Status: DC
  Administered 2023-07-28 – 2023-08-08 (×11): 6 via TOPICAL

## 2023-07-28 MED ORDER — ACETAMINOPHEN 325 MG PO TABS
ORAL_TABLET | ORAL | Status: AC
Start: 2023-07-28 — End: 2023-07-28
  Filled 2023-07-28: qty 2

## 2023-07-28 MED ORDER — ACETAMINOPHEN 325 MG PO TABS
650.0000 mg | ORAL_TABLET | ORAL | Status: DC | PRN
  Administered 2023-07-28 – 2023-07-29 (×2): 650 mg via ORAL
  Filled 2023-07-28: qty 2

## 2023-07-28 MED ORDER — METHYLPREDNISOLONE SODIUM SUCC 125 MG IJ SOLR
125.0000 mg | INTRAMUSCULAR | Status: DC
  Administered 2023-07-28: 125 mg via INTRAVENOUS
  Filled 2023-07-28: qty 2

## 2023-07-28 NOTE — TOC Progression Note (Addendum)
 Transition of Care [facility]) - Progression Note    Patient Details  Name: Jeremiah Nguyen MRN: [id number] Date of Birth: July 12, 1955  Transition of Care [facility]) CM/SW Contact  [name], LCSW Phone Number: 07/28/2023, 1:01 PM  Clinical Narrative:    CSW notified by DO that she has been unable to reach sister, needs contact person for goals of care discussion. CSW attempted call to sister Jeremiah Nguyen [phone] - left a VM requesting a return call.  CSW called numbers listed online/in chart for group home including  [phone] - this is also listed as sister Jeremiah Nguyen in contact list -  number disconnected.  [phone] - listed as Jeremiah Nguyen - voicemail full [phone] - listed as group home number - left a VM requesting a return call [phone] - listed as group home number - kept ringing with no answer [phone]- spoke with Jeremiah Nguyen, he states he is the Presenter, broadcasting for patient's group home. Jeremiah Nguyen confirms the group home is [facility] on [address] He states the main number for the facility is [phone], but they would not have the information needed for family. Jeremiah Nguyen states patient's cousin Jeremiah Nguyen owns the facility and it would be best to call her. [phone]- called Jeremiah Nguyen, left a VM requesting an urgent return call. DO updated.   2:30- Call from sister Jeremiah Nguyen - she states she is patient's sister, but would want Jeremiah Nguyen to "make any decisions about him." She states she is going to reach out to Jeremiah Nguyen now and ask her to call CSW back.   4:08- Awaiting return call from Jeremiah Nguyen. Updated DO, NP, and weekday TOC.        Expected Discharge Plan and Services                                               Social Determinants of Health (SDOH) Interventions SDOH Screenings   Food Insecurity: Patient Unable To Answer (07/13/2023)  Housing: Patient Unable To Answer (07/13/2023)  Transportation Needs: Patient Unable To Answer (07/13/2023)   Utilities: Patient Unable To Answer (07/13/2023)  Depression (PHQ2-9): Low Risk  (02/25/2019)  Financial Resource Strain: Low Risk  (06/30/2018)  Physical Activity: Inactive (06/30/2018)  Social Connections: Patient Unable To Answer (07/13/2023)  Stress: Stress Concern Present (06/30/2018)  Tobacco Use: High Risk (07/12/2023)    Readmission Risk Interventions     No data to display

## 2023-07-28 NOTE — Progress Notes (Signed)
 Switched pt to heated high flow nasal cannula due to ongoing desats on bubble version.  Pt appears comfortable.  O2 sat currently on 94-95%

## 2023-07-28 NOTE — Progress Notes (Addendum)
 Progress Note   [name] ZOX:[id number] DOB: 1955/11/03 DOA: 07/12/2023     16 DOS: the patient was seen and examined on 07/28/2023   Brief hospital course:  "Jeremiah Nguyen is a 68 year old male with a past medical history significant for COPD, alcohol dependence, and seizure disorder who presented to [facility] ED on 07/12/2023 from his group home due to altered mental status.  Per report he has been altered over the last 24 hours.   On arrival to the ED he remained somnolent and altered, oriented only to self.  Given his respiratory distress he was placed on BiPAP and eventually required intubation and levophed.  Positive for influenza A. Started on tamiflu.    07/15/23- patient passed SBT today.  He is liberated from MV on to [facility]. Remains with encephalopathy.  07/16/23- patient is improved overnight. He is on home psychiatric medications now.    07/23/23 -continue to wean O2"   I assumed care on 07/24/2023.  Further hospital course and management as outlined below.   Assessment and Plan:  Severe Acute Hypoxic and Hypercapnic Respiratory Failure Influenza positive- S/p intubation. S/p tamiflu.  Antibiotics completed. Resp culture- multi-species, Bcx NGTD, MRSA swab neg Very slow to wean down oxygen since admission. 3/8 -- weaned to 10 L/min HFNC O2 3/9 -- O2 sats decompensated early AM, transferred to stepdown, back on heated HF o2 at 70-80%FiO2 --RT following --Treated with IV steroids, completed --3/9 -- resume high dose IV steroids, chest x-ray, check Covid & full respiratory panel, check lactic acid and BNP --Supportive care with IS & flutter, nebulizer therapy, mucolytics, ICS --wean oxygen as tolerated, target sats > 90% --Will re-address code status with family/POA given prolonged tenuous respiratory status   Acute metabolic encephalopathy - due to Hypoxia and septic shock- improving.  Oriented to self, place, month. Mitts have been removed --PT/OT --Delirium  precautions --Minimize any sedating meds   Septic Shock - POA, now resolved Due to Flu, pneumonia.  Blood cultures negative. Hemodynamically stable Completed Tamiflu --Monitor clinically --Weaning oxygen as above     Acute Kidney Injury - resolved.   Acute urinary  - resolved. Foley removed. --Avoid nephrotoxic agents --Follow urine output --Monitor BMP   H/o seizures -  --Continue home meds, lamictal   HTN - currently with soft/low BP's Chronic HFrEF - compensated --Decreased dose of metoprolol - continue with hold parameter for MAP<65 --Monitor volume status   Schizophrenia --Continue home Thorazine - continue sertraline       Subjective: Pt seen in stepdown this AM, after transfer there earlier this AM with O2 desaturations requiring him to be placed back on heated high-flow O2.  Pt denies shortness of breath or any other complaints.  States he feels fine.     Physical Exam: Vitals:   07/28/23 0403 07/28/23 0508 07/28/23 0752 07/28/23 0757  BP:  (!) 115/56 116/65   Pulse:  (!) 103    Resp:  18 (!) 22   Temp:  99 F (37.2 C) 99.2 F (37.3 C)   TempSrc:  Oral Oral   SpO2: 94% 97% 100% 94%  Weight:  61.7 kg 61.2 kg   Height:       General exam: awake & alert, wakes to voice , no acute distress HEENT: moist mucus membranes, hearing grossly normal  Respiratory system: lungs diminished without audible no wheezes or rhonchi, normal respiratory effort at rest, on heated HFO2 at 40 L/min, 70% FiO2 with sats upper 90's on mointor Cardiovascular system:  normal S1/S2, tachycardic, regular rhythm, no pedal edema.   Gastrointestinal system: soft, NT, ND Central nervous system: no gross focal neurologic deficits Extremities: moves all, no edema, normal tone Psychiatry: normal mood, flat affect   Data Reviewed:  ABG -- 7.48 / pCO2 56, pO2 59 borderline, bicarb 41.7  Notable labs 3/8 --  BMP normal except Cl 97, bicarb 35, glucose 115 Normal CBC   Family  Communication: none present. Will attempt to call sister.  Disposition: Status is: Inpatient Remains inpatient appropriate because: increased O2 requirements, back on heated HF O2 in stepdown unit this AM (3/9)    Planned Discharge Destination: Skilled nursing facility vs ?LTAC    Time spent: 55 minutes including time at bedside and in coordination of care with staff, cross coverage provider and consultant/s.  Author: Pennie Banter, DO 07/28/2023 12:09 PM  For on call review [email or web address].

## 2023-07-28 NOTE — TOC Progression Note (Signed)
 Transition of Care [facility]) - Progression Note    Patient Details  Name: Jeremiah Nguyen MRN: [id number] Date of Birth: Sep 26, 1955  Transition of Care [facility]) CM/SW Contact  [name], LCSW Phone Number: 07/28/2023, 9:24 AM  Clinical Narrative:    CSW reached out to [facility] at [facility] and requested an update.        Expected Discharge Plan and Services                                               Social Determinants of Health (SDOH) Interventions SDOH Screenings   Food Insecurity: Patient Unable To Answer (07/13/2023)  Housing: Patient Unable To Answer (07/13/2023)  Transportation Needs: Patient Unable To Answer (07/13/2023)  Utilities: Patient Unable To Answer (07/13/2023)  Depression (PHQ2-9): Low Risk  (02/25/2019)  Financial Resource Strain: Low Risk  (06/30/2018)  Physical Activity: Inactive (06/30/2018)  Social Connections: Patient Unable To Answer (07/13/2023)  Stress: Stress Concern Present (06/30/2018)  Tobacco Use: High Risk (07/12/2023)    Readmission Risk Interventions     No data to display

## 2023-07-28 NOTE — Progress Notes (Signed)
 Patient motioned for this nurse to come to his room and said, "Take this off" as he was holding his B/P cuff.  Upon entering room patient had his previously placed IV sitting on the bedside table.  All parts intact to the IV and patient unsure as to why he removed the IV.  Site where the IV was is intact.

## 2023-07-28 NOTE — Consult Note (Signed)
 Consultation Note Date: 07/28/2023   Patient Name: Jeremiah Nguyen  DOB: 1955-12-10  MRN: [id number]  Age / Sex: 68 y.o., male  PCP: Armando Gang, FNP Referring Physician: Pennie Banter, DO  Reason for Consultation: Establishing goals of care   HPI/Brief Hospital Course: 68 y.o. male  with past medical history of COPD, current smoker, alcohol dependence, schizophrenia and seizure disorder admitted from group home on 07/12/2023 with altered mental status.  Found to be influenza A positive with pneumonia and septic shock-required mechanical ventilation and vasopressor support, successfully extubated 2/24, has remained encephalopathic, slow weaning of supplemental oxygen Has completed course of antibiotic therapy  3/9 RRT called early AM-oxygen saturations acutely decompensated requiring titration of HHFNC to 70-80% FiO2--COVID negative, RVP pending  Palliative medicine was consulted for assisting with goals of care conversations  Subjective:  Extensive chart review has been completed prior to meeting patient including labs, vital signs, imaging, progress notes, orders, and available advanced directive documents from current and previous encounters.  Collaborated with primary team, NOK and point of contact unclear. TOC assisted in reaching out to Group Home-Jeremiah Nguyen-cousin is owner of Group Home and per facility staff serves as Management consultant for Jeremiah Nguyen.  Visited with Jeremiah Nguyen at his bedside. He is awake and alert. He is unable to answer most orientation questions. His response being "I do not know" to where he is at, why he is in hospital and where he lived prior to hospitalization. He is able to share that overall he just "feels bad." No specific complaints of pain. Jeremiah Nguyen able to confirm Jeremiah Nguyen is his cousin and that he stays at her facility.  Called and spoke with Jeremiah Nguyen.  Introduced myself as a Publishing rights manager as a member of the palliative care  team. Explained palliative medicine is specialized medical care for people living with serious illness. It focuses on providing relief from the symptoms and stress of a serious illness. The goal is to improve quality of life for both the patient and the family.   Provided Jeremiah Nguyen with medical updates. Shared unfortunately Jeremiah Nguyen required transfer back to ICU due to acute decline in oxygen status-now requiring significant support from [facility].  Jeremiah Nguyen shares prior to hospitalization, Jeremiah Nguyen functions independently. He has lived within his family's group home for over 30 years. He attends a program within a school 5 days per week. He does not require assistance with ADL's or IADL's. Jeremiah Nguyen shares in the past, Jeremiah Nguyen has served as his own Management consultant. Jeremiah Nguyen shares Jeremiah Nguyen does not have a HCPOA or Legal Guardian in place. Jeremiah Nguyen has a total of 3 sisters that would be NOK, Jeremiah Nguyen shares they have not clearly established anyone as HCPOA within the family.  Discussed code status and the high risk of further decompensation and importance of having discussions regarding intubation and escalation of care. Jeremiah Nguyen shares she will reach out to all sisters and attempt to have them appoint point of contact and/or decision maker.  I discussed importance of continued conversations with family/support persons and all members of their medical team regarding overall plan of care and treatment options ensuring decisions are in alignment with patients goals of care.  All questions/concerns addressed. PMT will continue to follow and support patient as needed.  Objective: Primary Diagnoses: Present on Admission:  Acute respiratory failure with hypoxia (HCC)   Physical Exam Constitutional:      General: He is not in acute distress.    Appearance: He  is ill-appearing.  Pulmonary:     Effort: No respiratory distress.  Skin:    General: Skin is warm and dry.  Neurological:     Mental Status: He is alert. He is  disoriented.     Motor: Weakness present.     Vital Signs: BP 120/68   Pulse 88   Temp 99 F (37.2 C) (Oral)   Resp (!) 24   Ht 5' 7.99" (1.727 m)   Wt 61.2 kg   SpO2 97%   BMI 20.52 kg/m  Pain Scale: 0-10 POSS *See Group Information*: 2-Acceptable,Slightly drowsy, easily aroused Pain Score: 0-No pain   IO: Intake/output summary:  Intake/Output Summary (Last 24 hours) at 07/28/2023 1632 Last data filed at 07/28/2023 1100 Gross per 24 hour  Intake 100 ml  Output 1400 ml  Net -1300 ml    LBM: Last BM Date : 07/27/23 Baseline Weight: Weight: 68 kg Most recent weight: Weight: 61.2 kg      Assessment and Plan  SUMMARY OF RECOMMENDATIONS   Family to have discussions regarding decision maker High risk of decompensation PMT to continue to follow for ongoing needs and support  Discussed With: Primary team, nursing staff and [facility]   Thank you for this consult and allowing Palliative Medicine to participate in the care of Jeremiah Nguyen. Palliative medicine will continue to follow and assist as needed.   Time Total: 75 minutes  Time spent includes: Detailed review of medical records (labs, imaging, vital signs), medically appropriate exam (mental status, respiratory, cardiac, skin), discussed with treatment team, counseling and educating patient, family and staff, documenting clinical information, medication management and coordination of care.   Signed by: Leeanne Deed, DNP, AGNP-C Palliative Medicine    Please contact Palliative Medicine Team phone at [phone] for questions and concerns.  For individual provider: See Loretha Stapler

## 2023-07-28 NOTE — Plan of Care (Signed)
  Problem: Activity: Goal: Ability to tolerate increased activity will improve Outcome: Progressing   Problem: Respiratory: Goal: Ability to maintain a clear airway and adequate ventilation will improve Outcome: Progressing   Problem: Role Relationship: Goal: Method of communication will improve Outcome: Progressing   Problem: Education: Goal: Knowledge of General Education information will improve Description: Including pain rating scale, medication(s)/side effects and non-pharmacologic comfort measures Outcome: Progressing   Problem: Health Behavior/Discharge Planning: Goal: Ability to manage health-related needs will improve Outcome: Progressing   Problem: Clinical Measurements: Goal: Ability to maintain clinical measurements within normal limits will improve Outcome: Progressing Goal: Will remain free from infection Outcome: Progressing Goal: Diagnostic test results will improve Outcome: Progressing Goal: Respiratory complications will improve Outcome: Progressing Goal: Cardiovascular complication will be avoided Outcome: Progressing   Problem: Activity: Goal: Risk for activity intolerance will decrease Outcome: Progressing   Problem: Nutrition: Goal: Adequate nutrition will be maintained Outcome: Progressing   Problem: Coping: Goal: Level of anxiety will decrease Outcome: Progressing   Problem: Elimination: Goal: Will not experience complications related to bowel motility Outcome: Progressing Goal: Will not experience complications related to urinary retention Outcome: Progressing   Problem: Pain Managment: Goal: General experience of comfort will improve and/or be controlled Outcome: Progressing   Problem: Safety: Goal: Ability to remain free from injury will improve Outcome: Progressing   Problem: Skin Integrity: Goal: Risk for impaired skin integrity will decrease Outcome: Progressing   Problem: Education: Goal: Ability to describe self-care  measures that may prevent or decrease complications (Diabetes Survival Skills Education) will improve Outcome: Progressing Goal: Individualized Educational Video(s) Outcome: Progressing   Problem: Coping: Goal: Ability to adjust to condition or change in health will improve Outcome: Progressing   Problem: Fluid Volume: Goal: Ability to maintain a balanced intake and output will improve Outcome: Progressing   Problem: Health Behavior/Discharge Planning: Goal: Ability to identify and utilize available resources and services will improve Outcome: Progressing Goal: Ability to manage health-related needs will improve Outcome: Progressing   Problem: Metabolic: Goal: Ability to maintain appropriate glucose levels will improve Outcome: Progressing   Problem: Nutritional: Goal: Maintenance of adequate nutrition will improve Outcome: Progressing Goal: Progress toward achieving an optimal weight will improve Outcome: Progressing   Problem: Skin Integrity: Goal: Risk for impaired skin integrity will decrease Outcome: Progressing   Problem: Tissue Perfusion: Goal: Adequacy of tissue perfusion will improve Outcome: Progressing

## 2023-07-28 NOTE — Progress Notes (Signed)
       CROSS COVER NOTE  NAME: Jeremiah Nguyen MRN: [id number] DOB : 12/18/1955 ATTENDING PHYSICIAN: Pennie Banter, DO    Date of Service   07/28/2023   HPI/Events of Note   Patietn desat now requiring heaterd high flow 70%  Interventions   Assessment/Plan:    07/28/2023    5:08 AM 07/28/2023   12:58 AM 07/27/2023    8:48 PM  Vitals with BMI  Weight 136 lbs    BMI 20.69    Systolic 115 120 782  Diastolic 56 97 78  Pulse 103 106 68   Patient without distress. Following commands. Sats initially 88 %  Improved to 91 BBS diminished  ABG Chest xray Transfer to step down       Donnie Mesa NP [facility] Cross Cover 7pm-7am - check amion for availability Pager [phone]

## 2023-07-28 NOTE — Plan of Care (Signed)
 Continuing with plan of care.

## 2023-07-29 ENCOUNTER — Inpatient Hospital Stay

## 2023-07-29 LAB — D-DIMER, QUANTITATIVE: D-Dimer, Quant: 1.01 ug{FEU}/mL — ABNORMAL HIGH (ref 0.00–0.50)

## 2023-07-29 MED ORDER — ORAL CARE MOUTH RINSE: 15.0000 mL | OROMUCOSAL | Status: DC | PRN

## 2023-07-29 MED ORDER — SODIUM CHLORIDE 0.9 % IV SOLN
3.0000 g | Freq: Four times a day (QID) | INTRAVENOUS | Status: AC
  Administered 2023-07-29 – 2023-08-03 (×20): 3 g via INTRAVENOUS
  Filled 2023-07-29 (×20): qty 8

## 2023-07-29 MED ORDER — METHYLPREDNISOLONE SODIUM SUCC 125 MG IJ SOLR
60.0000 mg | INTRAMUSCULAR | Status: DC
  Administered 2023-07-29 – 2023-07-30 (×2): 60 mg via INTRAVENOUS
  Filled 2023-07-29 (×2): qty 2

## 2023-07-29 MED ORDER — IOHEXOL 350 MG/ML SOLN
75.0000 mL | Freq: Once | INTRAVENOUS | Status: AC | PRN
  Administered 2023-07-29: 75 mL via INTRAVENOUS

## 2023-07-29 NOTE — Progress Notes (Signed)
 Occupational Therapy Treatment Patient Details Name: Jeremiah Nguyen MRN: [id number] DOB: 1955/12/16 Today's Date: 07/29/2023   History of present illness Patient is a 68 year old male with history of COPD, alcohol dependence, and seizure disorder who presented on 07/12/2023 from group home due to altered mental status. Positive for influenza A, required mechanical ventilation, now on HHFNC.   OT comments  Pt seen for skilled co-treatment with PT for safety. Pt needing max A of 2 for bed mobility with total A to don B socks.Pt standing with RW and +2 assistance to take several side steps to the L. Pt sitting on EOB for ~ 10 minutes.Pt bringing cup to mouth for sips once assisted getting it into his hands. Pt returning to supine at end of session with call bell and all needed items within reach.       If plan is discharge home, recommend the following:  Help with stairs or ramp for entrance;Supervision due to cognitive status;A lot of help with walking and/or transfers;A lot of help with bathing/dressing/bathroom   Equipment Recommendations  Other (comment) (defer to next venue of care)       Precautions / Restrictions Precautions Precautions: Fall Recall of Precautions/Restrictions: Impaired Precaution/Restrictions Comments: monitor Sp02       Mobility Bed Mobility Overal bed mobility: Needs Assistance Bed Mobility: Supine to Sit, Sit to Supine Rolling: Max assist   Supine to sit: Mod assist, Max assist, +2 for physical assistance Sit to supine: Mod assist, Max assist, +2 for physical assistance   General bed mobility comments: verbal cues for sequencing and task initiation. increased time for processing    Transfers Overall transfer level: Needs assistance Equipment used: 2 person hand held assist Transfers: Sit to/from Stand Sit to Stand: Mod assist, Max assist, +2 physical assistance           General transfer comment: verbal cues for technique. lifting and lowering  assistance provided     Balance Overall balance assessment: Needs assistance Sitting-balance support: Feet supported Sitting balance-Leahy Scale: Good Sitting balance - Comments: sitting time around 10 minutes   Standing balance support: Bilateral upper extremity supported Standing balance-Leahy Scale: Poor Standing balance comment: external support required                           ADL either performed or assessed with clinical judgement   ADL Overall ADL's : Needs assistance/impaired                                       General ADL Comments: Pt bringing cup to mouth for sips of water while seated on EOB with increased time and cup placed in his hand.    Extremity/Trunk Assessment Upper Extremity Assessment Upper Extremity Assessment: Generalized weakness   Lower Extremity Assessment Lower Extremity Assessment: Generalized weakness        Vision Patient Visual Report: No change from baseline           Communication Communication Communication: Impaired Factors Affecting Communication: Difficulty expressing self   Cognition Arousal: Alert Behavior During Therapy: WFL for tasks assessed/performed                                 Following commands: Impaired Following commands impaired: Follows one step commands with increased time  Cueing   Cueing Techniques: Verbal cues, Tactile cues        General Comments Sp02 94% on HHFNC    Pertinent Vitals/ Pain       Pain Assessment Pain Assessment: Faces Faces Pain Scale: No hurt         Frequency  Min 2X/week        Progress Toward Goals  OT Goals(current goals can now be found in the care plan section)  Progress towards OT goals: Progressing toward goals         Co-evaluation    PT/OT/SLP Co-Evaluation/Treatment: Yes Reason for Co-Treatment: Complexity of the patient's impairments (multi-system involvement);For patient/therapist safety PT goals  addressed during session: Mobility/safety with mobility OT goals addressed during session: ADL's and self-care      AM-PAC OT "6 Clicks" Daily Activity     Outcome Measure   Help from another person eating meals?: A Little Help from another person taking care of personal grooming?: A Little Help from another person toileting, which includes using toliet, bedpan, or urinal?: A Lot Help from another person bathing (including washing, rinsing, drying)?: A Lot Help from another person to put on and taking off regular upper body clothing?: A Little Help from another person to put on and taking off regular lower body clothing?: A Lot 6 Click Score: 15    End of Session Equipment Utilized During Treatment: Gait belt;Oxygen;Rolling walker (2 wheels)  OT Visit Diagnosis: Other abnormalities of gait and mobility (R26.89);Muscle weakness (generalized) (M62.81)   Activity Tolerance Patient tolerated treatment well   Patient Left in bed;with call bell/phone within reach;with bed alarm set   Nurse Communication Mobility status        Time: 1610-9604 OT Time Calculation (min): 23 min  Charges: OT General Charges $OT Visit: 1 Visit OT Treatments $Therapeutic Activity: 8-22 mins  Jackquline Denmark, MS, OTR/L , CBIS ascom [phone]  07/29/23, 1:23 PM

## 2023-07-29 NOTE — Progress Notes (Signed)
 Physical Therapy Treatment Patient Details Name: Jeremiah Nguyen MRN: [id number] DOB: January 03, 1956 Today's Date: 07/29/2023   History of Present Illness Patient is a 68 year old male with history of COPD, alcohol dependence, and seizure disorder who presented on 07/12/2023 from group home due to altered mental status. Positive for influenza A, required mechanical ventilation, now on HHFNC.    PT Comments  Patient is agreeable to PT session. He required +2 person assistance for mobility. Standing tolerance is limited by fatigue. Weight shifting facilitation provided and patient able to take several side step with assistance. Sp02 94% on 40L HHFNC. Recommend to continue PT to maximize independence and facilitate return to prior level of function. Anticipate patient will need rehabilitation < 3 hours/day after this hospital stay.    If plan is discharge home, recommend the following: Two people to help with walking and/or transfers;A lot of help with bathing/dressing/bathroom;Assist for transportation;Help with stairs or ramp for entrance;Supervision due to cognitive status;Assistance with cooking/housework;Assistance with feeding;Direct supervision/assist for medications management;Direct supervision/assist for financial management   Can travel by private vehicle     No  Equipment Recommendations   (to be determined at next level of care)    Recommendations for Other Services       Precautions / Restrictions Precautions Precautions: Fall Recall of Precautions/Restrictions: Impaired Precaution/Restrictions Comments: monitor Sp02 Restrictions Weight Bearing Restrictions Per Provider Order: No     Mobility  Bed Mobility Overal bed mobility: Needs Assistance Bed Mobility: Supine to Sit, Sit to Supine     Supine to sit: Mod assist, Max assist, +2 for physical assistance Sit to supine: Mod assist, Max assist, +2 for physical assistance   General bed mobility comments: verbal cues for  sequencing and task initiation. increased time for processing    Transfers Overall transfer level: Needs assistance Equipment used: 2 person hand held assist Transfers: Sit to/from Stand Sit to Stand: Mod assist, Max assist, +2 physical assistance           General transfer comment: verbal cues for technique. lifting and lowering assistance provided    Ambulation/Gait         Gait velocity: patient is able to take several side steps to the left with Mod-Max A +2 person. faciliation for weight shifting. activity tolerance limited by fatigue         Stairs             Wheelchair Mobility     Tilt Bed    Modified Rankin (Stroke Patients Only)       Balance Overall balance assessment: Needs assistance Sitting-balance support: Feet supported Sitting balance-Leahy Scale: Good Sitting balance - Comments: sitting time around 10 minutes   Standing balance support: Bilateral upper extremity supported Standing balance-Leahy Scale: Poor Standing balance comment: external support required                            Communication Communication Communication: Impaired Factors Affecting Communication: Difficulty expressing self  Cognition Arousal: Alert Behavior During Therapy: WFL for tasks assessed/performed   PT - Cognitive impairments: No apparent impairments, Memory, Sequencing                         Following commands: Impaired Following commands impaired: Follows one step commands with increased time    Cueing Cueing Techniques: Verbal cues, Tactile cues  Exercises General Exercises - Lower Extremity Long Arc Quad: AROM, AAROM, Strengthening, Both,  10 reps, Seated Other Exercises Other Exercises: verbal and visual cues for exercise technique for strengthening    General Comments General comments (skin integrity, edema, etc.): Sp02 94% on HHFNC      Pertinent Vitals/Pain Pain Assessment Pain Assessment: No/denies pain     Home Living                          Prior Function            PT Goals (current goals can now be found in the care plan section) Acute Rehab PT Goals Patient Stated Goal: none stated PT Goal Formulation: With patient Time For Goal Achievement: 08/01/23 Potential to Achieve Goals: Fair Progress towards PT goals: Progressing toward goals    Frequency    Min 1X/week      PT Plan      Co-evaluation PT/OT/SLP Co-Evaluation/Treatment: Yes Reason for Co-Treatment: Complexity of the patient's impairments (multi-system involvement);For patient/therapist safety PT goals addressed during session: Mobility/safety with mobility        AM-PAC PT "6 Clicks" Mobility   Outcome Measure  Help needed turning from your back to your side while in a flat bed without using bedrails?: A Lot Help needed moving from lying on your back to sitting on the side of a flat bed without using bedrails?: A Lot Help needed moving to and from a bed to a chair (including a wheelchair)?: Total Help needed standing up from a chair using your arms (e.g., wheelchair or bedside chair)?: Total Help needed to walk in hospital room?: Total Help needed climbing 3-5 steps with a railing? : Total 6 Click Score: 8    End of Session Equipment Utilized During Treatment: Oxygen (40L HHFNC) Activity Tolerance: Patient tolerated treatment well;Patient limited by fatigue Patient left: in bed;with call bell/phone within reach;with bed alarm set Nurse Communication: Mobility status PT Visit Diagnosis: Muscle weakness (generalized) (M62.81);Unsteadiness on feet (R26.81)     Time: 1610-9604 PT Time Calculation (min) (ACUTE ONLY): 23 min  Charges:    $Therapeutic Activity: 8-22 mins PT General Charges $$ ACUTE PT VISIT: 1 Visit                    Donna Bernard, PT, MPT    [name] 07/29/2023, 1:04 PM

## 2023-07-29 NOTE — Plan of Care (Signed)
  Problem: Activity: Goal: Ability to tolerate increased activity will improve Outcome: Progressing   Problem: Respiratory: Goal: Ability to maintain a clear airway and adequate ventilation will improve Outcome: Progressing   Problem: Role Relationship: Goal: Method of communication will improve Outcome: Progressing   Problem: Education: Goal: Knowledge of General Education information will improve Description: Including pain rating scale, medication(s)/side effects and non-pharmacologic comfort measures Outcome: Progressing   Problem: Health Behavior/Discharge Planning: Goal: Ability to manage health-related needs will improve Outcome: Progressing   Problem: Clinical Measurements: Goal: Ability to maintain clinical measurements within normal limits will improve Outcome: Progressing Goal: Will remain free from infection Outcome: Progressing Goal: Diagnostic test results will improve Outcome: Progressing Goal: Respiratory complications will improve Outcome: Progressing Goal: Cardiovascular complication will be avoided Outcome: Progressing   Problem: Activity: Goal: Risk for activity intolerance will decrease Outcome: Progressing   Problem: Nutrition: Goal: Adequate nutrition will be maintained Outcome: Progressing   Problem: Coping: Goal: Level of anxiety will decrease Outcome: Progressing   Problem: Elimination: Goal: Will not experience complications related to bowel motility Outcome: Progressing Goal: Will not experience complications related to urinary retention Outcome: Progressing   Problem: Pain Managment: Goal: General experience of comfort will improve and/or be controlled Outcome: Progressing   Problem: Safety: Goal: Ability to remain free from injury will improve Outcome: Progressing   Problem: Skin Integrity: Goal: Risk for impaired skin integrity will decrease Outcome: Progressing   Problem: Education: Goal: Ability to describe self-care  measures that may prevent or decrease complications (Diabetes Survival Skills Education) will improve Outcome: Progressing Goal: Individualized Educational Video(s) Outcome: Progressing   Problem: Coping: Goal: Ability to adjust to condition or change in health will improve Outcome: Progressing   Problem: Fluid Volume: Goal: Ability to maintain a balanced intake and output will improve Outcome: Progressing   Problem: Health Behavior/Discharge Planning: Goal: Ability to identify and utilize available resources and services will improve Outcome: Progressing Goal: Ability to manage health-related needs will improve Outcome: Progressing   Problem: Metabolic: Goal: Ability to maintain appropriate glucose levels will improve Outcome: Progressing   Problem: Nutritional: Goal: Maintenance of adequate nutrition will improve Outcome: Progressing Goal: Progress toward achieving an optimal weight will improve Outcome: Progressing   Problem: Skin Integrity: Goal: Risk for impaired skin integrity will decrease Outcome: Progressing   Problem: Tissue Perfusion: Goal: Adequacy of tissue perfusion will improve Outcome: Progressing

## 2023-07-29 NOTE — Progress Notes (Signed)
 Progress Note   [name] YQI:[id number] DOB: 06/10/1955 DOA: 07/12/2023     17 DOS: the patient was seen and examined on 07/29/2023   Brief hospital course:  "Jeremiah Nguyen is a 68 year old male with a past medical history significant for COPD, alcohol dependence, and seizure disorder who presented to [facility] ED on 07/12/2023 from his group home due to altered mental status.  Per report he has been altered over the last 24 hours.   On arrival to the ED he remained somnolent and altered, oriented only to self.  Given his respiratory distress he was placed on BiPAP and eventually required intubation and levophed.  Positive for influenza A. Started on tamiflu.    07/15/23- patient passed SBT today.  He is liberated from MV on to [facility]. Remains with encephalopathy.  07/16/23- patient is improved overnight. He is on home psychiatric medications now.    07/23/23 -continue to wean O2"   I assumed care on 07/24/2023.  Further hospital course and management as outlined below.   Assessment and Plan:  Severe Acute Hypoxic and Hypercapnic Respiratory Failure Influenza positive- S/p intubation. S/p tamiflu.  Antibiotics completed. Resp culture- multi-species, Bcx NGTD, MRSA swab neg Very slow to wean down oxygen since admission. 3/8 -- weaned to 10 L/min HFNC O2 3/9 -- O2 sats decompensated early AM, transferred to stepdown, back on heated HF o2 at 70-80%FiO2 - resume IV steroids.  Negative RVP, normal BMP, normal LA, CXR neg 3/10 -- remains on heated HF O2 50 L/min 65% FiO2 --RT following --Treated with IV steroids, completed --Continue IV Solu-medrol 60 mg daily --Check D-dimer --Supportive care with IS & flutter, nebulizer therapy, mucolytics, ICS --wean oxygen as tolerated, target sats > 90% --Will re-address code status with family/POA given prolonged tenuous respiratory status   Acute metabolic encephalopathy - due to Hypoxia and septic shock- improving.  Oriented to self,  place, month. Mitts have been removed --PT/OT --Delirium precautions --Minimize any sedating meds   Septic Shock - POA, now resolved Due to Flu, pneumonia.  Blood cultures negative. Hemodynamically stable Completed Tamiflu --Monitor clinically --Weaning oxygen as above     Acute Kidney Injury - resolved.   Acute urinary  - resolved. Foley removed. --Avoid nephrotoxic agents --Follow urine output --Monitor BMP   H/o seizures -  --Continue home meds, lamictal   HTN - currently with soft/low BP's Chronic HFrEF - compensated --Decreased dose of metoprolol - continue with hold parameter for MAP<65 --Monitor volume status   Schizophrenia --Continue home Thorazine - continue sertraline        Subjective: Pt seen in stepdown this AM.  He was sleeping but woke to voice. Denies difficulty breathing or other acute complaints.       Physical Exam: Vitals:   07/29/23 1000 07/29/23 1100 07/29/23 1200 07/29/23 1300  BP: (!) 122/54 112/69 (!) 104/55 114/65  Pulse:  (!) 29 89 87  Resp: (!) 22 (!) 26 (!) 30 (!) 30  Temp:    97.8 F (36.6 C)  TempSrc:    Axillary  SpO2:  92% 91% 94%  Weight:      Height:       General exam: awake & alert, wakes to voice , no acute distress HEENT: moist mucus membranes, hearing grossly normal  Respiratory system: lungs clear but generally diminished, no audible wheezing, on heated HF O2 at 50 L/min and 65% FiO2 Cardiovascular system: normal S1/S2, RRR, no pedal edema.   Gastrointestinal system: soft, NT, ND Central  nervous system: no gross focal neurologic deficits Extremities: moves all, no edema, normal tone Psychiatry: normal mood, flat affect   Data Reviewed:  No new labs today.  3/9 - ABG 7.48 / 56 / 58 / 41.7,  BNP normal 32.8,  LA normal 0.8   Family Communication: none present. Have been unable to reach sister on attempts to call her.  Will continue to try to reach her. TOC and Palliative care also communicating with group  home, attempting family - see notes.  Disposition: Status is: Inpatient Remains inpatient appropriate because: remains on heated HF oxygen    Planned Discharge Destination: Skilled nursing facility vs ?LTAC    Time spent: 42 minutes   Author: Pennie Banter, DO 07/29/2023 2:30 PM  For on call review [email or web address].

## 2023-07-29 NOTE — Progress Notes (Signed)
 Patient was transferred to room 252 after report given to [facility]. Answered all questions ans concerns. Patient transferred with all belongings, Medications, and chart. Family made aware. Patient transferred on non rebreather and respiratory placed back on HHFNC upon arrival to room. Patient in no active distress with transfer. Will transfer care at this time.

## 2023-07-29 NOTE — Plan of Care (Signed)
   Problem: Activity: Goal: Risk for activity intolerance will decrease Outcome: Progressing   Problem: Nutrition: Goal: Adequate nutrition will be maintained Outcome: Progressing   Problem: Coping: Goal: Level of anxiety will decrease Outcome: Progressing

## 2023-07-29 NOTE — Progress Notes (Signed)
 Pharmacy Antibiotic Note  Jeremiah Nguyen is a 68 y.o. male admitted on 07/12/2023 with  aspiration pneumonia .  Pharmacy has been consulted for Unasyn dosing.  Today, 07/29/23 Last BMP/CBC 3/8 3/9 Respiratory panel negative, COVID negative HHFNC 40 L/min w/ FiO2 48% Afebrile 3/10 CT Chest: No PE, faint tree-in-bud opacities in the right lower lobe, likely infectious/inflammatory. Aspiration is considered. Bilateral lower lobe atelectasis improved from CT last month  Plan: Start Unasyn 3 g IV Q6H Continue to monitor renal function and follow culture results   Height: 5' 7.99" (172.7 cm) Weight: 61.7 kg (136 lb 0.4 oz) IBW/kg (Calculated) : 68.38  Temp (24hrs), Avg:98.1 F (36.7 C), Min:97.8 F (36.6 C), Max:98.6 F (37 C)  Recent Labs  Lab 07/23/23 0836 07/27/23 0916 07/28/23 0901  WBC  --  7.2  --   CREATININE 0.93 0.89  --   LATICACIDVEN  --   --  0.8    Estimated Creatinine Clearance: 69.3 mL/min (by C-G formula based on SCr of 0.89 mg/dL).    No Known Allergies  Antimicrobials this admission: Azithromycin 2/21-2/25  Ceftriaxone 2/21-2/25 Oseltamivir 2/21-2/26 Unasyn>>  Microbiology results: 2/21 Bcx: NGTD 2/21 MRSA PCR: negative 2/23 Trach aspirate: few candida albicans 3/9 Respiratory panel: negative  Thank you for allowing pharmacy to be a part of this patient's care.  Merryl Hacker, PharmD Clinical Pharmacist  07/29/2023 7:59 PM

## 2023-07-30 DIAGNOSIS — J9601 Acute respiratory failure with hypoxia: Secondary | ICD-10-CM | POA: Diagnosis not present

## 2023-07-30 DIAGNOSIS — A419 Sepsis, unspecified organism: Secondary | ICD-10-CM | POA: Diagnosis not present

## 2023-07-30 DIAGNOSIS — J101 Influenza due to other identified influenza virus with other respiratory manifestations: Secondary | ICD-10-CM | POA: Diagnosis not present

## 2023-07-30 DIAGNOSIS — Z515 Encounter for palliative care: Secondary | ICD-10-CM | POA: Diagnosis not present

## 2023-07-30 LAB — BASIC METABOLIC PANEL
Anion gap: 9 (ref 5–15)
BUN: 16 mg/dL (ref 8–23)
CO2: 32 mmol/L (ref 22–32)
Calcium: 9.2 mg/dL (ref 8.9–10.3)
Chloride: 97 mmol/L — ABNORMAL LOW (ref 98–111)
Creatinine, Ser: 0.86 mg/dL (ref 0.61–1.24)
GFR, Estimated: >60 mL/min (ref >60)
Glucose, Bld: 93 mg/dL (ref 70–99)
Potassium: 3.5 mmol/L (ref 3.5–5.1)
Sodium: 138 mmol/L (ref 135–145)

## 2023-07-30 LAB — MAGNESIUM: Magnesium: 2 mg/dL (ref 1.7–2.4)

## 2023-07-30 LAB — CBC
HCT: 39.5 % (ref 39.0–52.0)
Hemoglobin: 13.3 g/dL (ref 13.0–17.0)
MCH: 30.3 pg (ref 26.0–34.0)
MCHC: 33.7 g/dL (ref 30.0–36.0)
MCV: 90 fL (ref 80.0–100.0)
Platelets: 260 10*3/uL (ref 150–400)
RBC: 4.39 MIL/uL (ref 4.22–5.81)
RDW: 13.9 % (ref 11.5–15.5)
WBC: 9.9 10*3/uL (ref 4.0–10.5)
nRBC: 0 % (ref 0.0–0.2)

## 2023-07-30 MED ORDER — METHYLPREDNISOLONE SODIUM SUCC 40 MG IJ SOLR
40.0000 mg | INTRAMUSCULAR | Status: DC
  Administered 2023-07-31 – 2023-08-05 (×6): 40 mg via INTRAVENOUS
  Filled 2023-07-30 (×6): qty 1

## 2023-07-30 NOTE — Progress Notes (Signed)
                                                     Palliative Care Progress Note, Assessment & Plan   Patient Name: Jeremiah Nguyen       Date: 07/30/2023 DOB: 1955/12/17  Age: 68 y.o. MRN#: [id number] Attending Physician: Pennie Banter, DO Primary Care Physician: Armando Gang, FNP Admit Date: 07/12/2023  Subjective: Patient is sitting up in bed with high flow nasal cannula in place.  He acknowledges my presence.  He is able to make his wishes known.  RN is at bedside during my visit.  No family or friends present during my visit.  HPI:  68 y.o. male  with past medical history of COPD, current smoker, alcohol dependence, schizophrenia and seizure disorder admitted from group home on 07/12/2023 with altered mental status.   Found to be influenza A positive with pneumonia and septic shock-required mechanical ventilation and vasopressor support, successfully extubated 2/24, has remained encephalopathic, slow weaning of supplemental oxygen Has completed course of antibiotic therapy   Palliative medicine was consulted for assisting with goals of care conversations  Summary of counseling/coordination of care: Extensive chart review completed prior to meeting patient including labs, vital signs, imaging, progress notes, orders, and available advanced directive documents from current and previous encounters.   After reviewing the patient's chart and assessing the patient at bedside, I spoke with patient in regards to symptom management and goals of care.   Patient endorses that he needs to have a bowel movement.  Counseled with RN who confirmed that patient was given MiraLAX this morning.  Patient endorses he needs to go to the bathroom right now.  RN placed patient on bedpan.  Discussed plan of care and symptom management with patient.  He says "I do not  know what you are talking about".  Patient remains unable to participate in goals of care medical decision making independently at this time.  No adjustment to [facility] needed at this time.  PMT will continue to follow and support patient and family throughout his hospitalization.  Physical Exam Vitals reviewed.  Constitutional:      General: He is not in acute distress.    Appearance: He is normal weight.  HENT:     Head: Normocephalic.     Nose: Nose normal.     Mouth/Throat:     Mouth: Mucous membranes are moist.  Pulmonary:     Effort: Pulmonary effort is normal.  Abdominal:     Palpations: Abdomen is soft.  Neurological:     Mental Status: He is alert.     Comments: Oriented to self  Psychiatric:        Mood and Affect: Mood normal.        Behavior: Behavior normal.             Total Time 25 minutes   Time spent includes: Detailed review of medical records (labs, imaging, vital signs), medically appropriate exam (mental status, respiratory, cardiac, skin), discussed with treatment team, counseling and educating patient, family and staff, documenting clinical information, medication management and coordination of care.  Samara Deist L. Bonita Quin, DNP, FNP-BC Palliative Medicine Team

## 2023-07-30 NOTE — Progress Notes (Signed)
 Patient transferred to room 252 from the ED. He is alert to self. No c/o pain or discomfort. V/S obtained and tele monitor applied. Oriented to room. Call bell placed within reach. Bed locked and in lowest position. Report received from Hoover Browns, RN

## 2023-07-30 NOTE — Plan of Care (Signed)
   Problem: Education: Goal: Knowledge of General Education information will improve Description: Including pain rating scale, medication(s)/side effects and non-pharmacologic comfort measures Outcome: Progressing   Problem: Skin Integrity: Goal: Risk for impaired skin integrity will decrease Outcome: Progressing

## 2023-07-30 NOTE — TOC Progression Note (Addendum)
 Transition of Care [facility]) - Progression Note    Patient Details  Name: Jeremiah Nguyen MRN: [id number] Date of Birth: 15-May-1956  [facility] CM/SW Contact  Truddie Hidden, RN Phone Number: 07/30/2023, 9:59 AM  Clinical Narrative:    Attempt to reach patient's cousin, Jeremiah Nguyen to discuss discharge planning. No answer. Left a message.   Per Jeremiah Nguyen, [employer] she has spoken with patient's cousin, Jeremiah Nguyen who was previously given permission to by patient's sister Jeremiah Nguyen and Jeremiah Nguyen to make decisions. She is not comfortable doing so. Jeremiah Nguyen (aunt) contacted who agreed with Jeremiah Nguyen that LTAC is the best option for the patient. Jeremiah Nguyen will call Jeremiah Nguyen and will update her and provide her with Jeremiah Nguyen's contact information.        Expected Discharge Plan and Services                                               Social Determinants of Health (SDOH) Interventions SDOH Screenings   Food Insecurity: Patient Unable To Answer (07/13/2023)  Housing: Patient Unable To Answer (07/13/2023)  Transportation Needs: Patient Unable To Answer (07/13/2023)  Utilities: Patient Unable To Answer (07/13/2023)  Depression (PHQ2-9): Low Risk  (02/25/2019)  Financial Resource Strain: Low Risk  (06/30/2018)  Physical Activity: Inactive (06/30/2018)  Social Connections: Patient Unable To Answer (07/13/2023)  Stress: Stress Concern Present (06/30/2018)  Tobacco Use: High Risk (07/12/2023)    Readmission Risk Interventions     No data to display

## 2023-07-30 NOTE — Progress Notes (Signed)
 Progress Note   [name] ZOX:[id number] DOB: July 29, 1955 DOA: 07/12/2023     18 DOS: the patient was seen and examined on 07/30/2023   Brief hospital course:  "Jeremiah Nguyen is a 68 year old male with a past medical history significant for COPD, alcohol dependence, and seizure disorder who presented to [facility] ED on 07/12/2023 from his group home due to altered mental status.  Per report he has been altered over the last 24 hours.   On arrival to the ED he remained somnolent and altered, oriented only to self.  Given his respiratory distress he was placed on BiPAP and eventually required intubation and levophed.  Positive for influenza A. Started on tamiflu.    07/15/23- patient passed SBT today.  He is liberated from MV on to [facility]. Remains with encephalopathy.  07/16/23- patient is improved overnight. He is on home psychiatric medications now.    07/23/23 -continue to wean O2"   I assumed care on 07/24/2023.  Further hospital course and management as outlined below.   Assessment and Plan:  Severe Acute Hypoxic and Hypercapnic Respiratory Failure Right lower lobe pneumonia - not POA, on CTA chest 3/10 Influenza positive- S/p intubation. S/p tamiflu.  Antibiotics completed. Resp culture- multi-species, Bcx NGTD, MRSA swab neg Emphysema  - baesd on imaging Very slow to wean down oxygen since admission. 3/8 -- weaned to 10 L/min HFNC O2 3/9 -- O2 sats decompensated early AM, transferred to stepdown, back on heated HF o2 at 70-80%FiO2 - resume IV steroids.  Negative RVP, normal BMP, normal LA, CXR neg 3/10 -- remains on heated HF O2 50 L/min 65% FiO2.  Negative CTA chest for PE, but showed new RLL infiltrate, possible  3/11 -- 40 L/min 60-65% fiO2  --RT following --Treated with IV steroids, completed --Reduce IV Solu-medrol 60 >> 40 mg daily, wean steroids --Started on Unasyn 3/10 PM --Supportive care with IS & flutter, nebulizer therapy, mucolytics, ICS --wean oxygen as  tolerated, target sats > 90% --Palliative care following for GOC --[facility] is evaluating pt and discussing with family for possible dispo    Acute metabolic encephalopathy - due to Hypoxia and septic shock- improving.  Oriented to self, place, month. Mitts have been removed --PT/OT --Delirium precautions --Minimize any sedating meds   Septic Shock - POA, now resolved Due to Flu, pneumonia.  Blood cultures negative. Hemodynamically stable Completed Tamiflu --Monitor clinically --Weaning oxygen as above     Acute Kidney Injury - resolved.   Acute urinary  - resolved. Foley removed. --Avoid nephrotoxic agents --Follow urine output --Monitor BMP   H/o seizures -  --Continue home meds, lamictal   HTN - currently with soft/low BP's Chronic HFrEF - compensated --Decreased dose of metoprolol - continue with hold parameter for MAP<65 --Monitor volume status   Schizophrenia --Continue home Thorazine - continue sertraline        Subjective: Pt seen awake resting in bed, asks for head of bed to be lowered.  He denies shortness of breath, cough or other complaints.      Physical Exam: Vitals:   07/30/23 0832 07/30/23 1145 07/30/23 1435 07/30/23 1452  BP:  (!) 103/56  101/66  Pulse:  93  98  Resp:  (!) 25  (!) 25  Temp:  98.7 F (37.1 C)  98.6 F (37 C)  TempSrc:  Oral    SpO2: 94% 92% 93% 94%  Weight:      Height:       General exam: awake &  alert, wakes to voice , no acute distress HEENT: moist mucus membranes, hearing grossly normal  Respiratory system: lungs clear but generally diminished, no audible wheezing, on heated HF O2 at 40 L/min and 60% FiO2 Cardiovascular system: normal S1/S2, RRR, no pedal edema.   Gastrointestinal system: soft, NT, ND Central nervous system: no gross focal neurologic deficits Extremities: moves all, no edema, normal tone Psychiatry: normal mood, flat affect   Data Reviewed:  Notable labs -- BMP normal except Cl 97   Family  Communication: none present. Have been unable to reach sister on attempts to call her.     Disposition: Status is: Inpatient Remains inpatient appropriate because: remains on heated HF oxygen    Planned Discharge Destination: [facility] - [facility]     Time spent: 38 minutes   Author: Pennie Banter, DO 07/30/2023 5:07 PM  For on call review [email or web address].

## 2023-07-31 DIAGNOSIS — G9341 Metabolic encephalopathy: Secondary | ICD-10-CM | POA: Diagnosis not present

## 2023-07-31 DIAGNOSIS — N179 Acute kidney failure, unspecified: Secondary | ICD-10-CM

## 2023-07-31 DIAGNOSIS — E440 Moderate protein-calorie malnutrition: Secondary | ICD-10-CM | POA: Diagnosis not present

## 2023-07-31 DIAGNOSIS — R569 Unspecified convulsions: Secondary | ICD-10-CM

## 2023-07-31 DIAGNOSIS — J9601 Acute respiratory failure with hypoxia: Secondary | ICD-10-CM | POA: Diagnosis not present

## 2023-07-31 DIAGNOSIS — J09X1 Influenza due to identified novel influenza A virus with pneumonia: Secondary | ICD-10-CM | POA: Diagnosis present

## 2023-07-31 DIAGNOSIS — K219 Gastro-esophageal reflux disease without esophagitis: Secondary | ICD-10-CM

## 2023-07-31 HISTORY — DX: Metabolic encephalopathy: G93.41

## 2023-07-31 HISTORY — DX: Acute kidney failure, unspecified: N17.9

## 2023-07-31 HISTORY — DX: Influenza due to identified novel influenza A virus with pneumonia: J09.X1

## 2023-07-31 MED ORDER — UMECLIDINIUM BROMIDE 62.5 MCG/ACT IN AEPB
1.0000 | INHALATION_SPRAY | Freq: Every day | RESPIRATORY_TRACT | Status: DC
  Administered 2023-07-31 – 2023-08-12 (×12): 1 via RESPIRATORY_TRACT
  Filled 2023-07-31 (×2): qty 7

## 2023-07-31 MED ORDER — PANTOPRAZOLE SODIUM 40 MG PO TBEC
40.0000 mg | DELAYED_RELEASE_TABLET | Freq: Two times a day (BID) | ORAL | Status: DC
  Administered 2023-07-31 – 2023-08-12 (×24): 40 mg via ORAL
  Filled 2023-07-31 (×24): qty 1

## 2023-07-31 MED ORDER — BENZONATATE 100 MG PO CAPS
100.0000 mg | ORAL_CAPSULE | Freq: Three times a day (TID) | ORAL | Status: DC
  Administered 2023-07-31 – 2023-08-12 (×38): 100 mg via ORAL
  Filled 2023-07-31 (×38): qty 1

## 2023-07-31 MED ORDER — GUAIFENESIN 100 MG/5ML PO LIQD: 5.0000 mL | ORAL | Status: DC | PRN

## 2023-07-31 MED ORDER — POLYETHYLENE GLYCOL 3350 17 G PO PACK
17.0000 g | PACK | Freq: Two times a day (BID) | ORAL | Status: DC
  Administered 2023-07-31 – 2023-08-12 (×17): 17 g via ORAL
  Filled 2023-07-31 (×19): qty 1

## 2023-07-31 MED ORDER — FLUTICASONE FUROATE-VILANTEROL 100-25 MCG/ACT IN AEPB
1.0000 | INHALATION_SPRAY | Freq: Every day | RESPIRATORY_TRACT | Status: DC
  Administered 2023-07-31 – 2023-08-12 (×13): 1 via RESPIRATORY_TRACT
  Filled 2023-07-31: qty 28

## 2023-07-31 MED ORDER — SENNA 8.6 MG PO TABS
2.0000 | ORAL_TABLET | Freq: Every day | ORAL | Status: DC
Start: 2023-07-31 — End: 2023-08-12
  Administered 2023-07-31 – 2023-08-11 (×10): 17.2 mg via ORAL
  Filled 2023-07-31 (×10): qty 2

## 2023-07-31 NOTE — Progress Notes (Signed)
 Progress Note   [name] LKG:[id number] DOB: [birth year] DOA: 07/12/2023     19 DOS: the patient was seen and examined on 07/31/2023   Brief hospital course: 68 year old male with a past medical history significant for COPD, alcohol dependence, and seizure disorder who presented to [facility] ED on 07/12/2023 from his group home due to altered mental status.  Per report he has been altered over the last 24 hours.   On arrival to the ED he remained somnolent and altered, oriented only to self.  Given his respiratory distress he was placed on BiPAP and eventually required intubation and levophed.  Positive for influenza A. Started on tamiflu.    07/15/23- patient passed SBT today.  He is liberated from MV on to [facility]. Remains with encephalopathy.  07/16/23- patient is improved overnight. He is on home psychiatric medications now.  3/8 -- weaned to 10 L/min HFNC O2 3/9 -- O2 sats decompensated early AM, transferred to stepdown, back on heated HF o2 at 70-80%FiO2 - resume IV steroids.  Negative RVP, normal BMP, normal LA, CXR neg 3/10 -- remains on heated HF O2 50 L/min 65% FiO2.  Negative CTA chest for PE, but showed new RLL infiltrate, possible and started on Unasyn 3/11 -- 40 L/min 60-65% fiO2 3/12.  Patient on heated high flow nasal cannula 49% oxygen this morning with 15 L flow.  Assessment and Plan: * Acute respiratory failure with hypoxia and hypercapnia (HCC) Patient on heated high flow nasal cannula 49% oxygen this morning with 15 L flow.  Since the patient has been here 19 days and still on heated high flow nasal cannula will reconsult pulmonary.  Patient intubated during the hospital course.  Added inhalers.  Patient on Solu-Medrol and Unasyn.  AKI (acute kidney injury) (HCC) Resolved.  Creatinine 2.0 on admission and creatinine 0.86 on last check.  Acute metabolic encephalopathy Secondary to septic shock  Influenza A with pneumonia On admission  Malnutrition of moderate  degree Continue supplements  Gastroesophageal reflux disease Increase PPI to twice daily patient has dilated fluid-filled esophagus which can increase risk of of aspiration.  Patient does not complain of any nausea or vomiting.  Wondering if patient has achalasia.  Requiring too much oxygen for any endoscopy procedure at this point  Seizures (HCC) Continue Lamictal  Septic shock (HCC) Present on admission and now has resolved.        Subjective: Patient feels okay.  Some shortness of breath.  Some cough.  No trouble swallowing.  No vomiting.  Positive for constipation.  Admitted for septic shock.  Still on heated high flow nasal cannula 49% oxygen this morning.  Physical Exam: Vitals:   07/31/23 0458 07/31/23 0527 07/31/23 0815 07/31/23 1224  BP:  (!) 99/55  (!) 105/56  Pulse:  93  98  Resp:  19  20  Temp:  98.5 F (36.9 C)  98.1 F (36.7 C)  TempSrc:  Oral  Oral  SpO2:  92% 90% 95%  Weight: 62.7 kg     Height:       Physical Exam HENT:     Head: Normocephalic.     Mouth/Throat:     Pharynx: No oropharyngeal exudate.  Eyes:     General: Lids are normal.     Conjunctiva/sclera: Conjunctivae normal.  Cardiovascular:     Rate and Rhythm: Normal rate and regular rhythm.     Heart sounds: Normal heart sounds, S1 normal and S2 normal.  Pulmonary:  Breath sounds: Examination of the right-lower field reveals decreased breath sounds. Examination of the left-lower field reveals decreased breath sounds. Decreased breath sounds present. No wheezing, rhonchi or rales.  Abdominal:     Palpations: Abdomen is soft.     Tenderness: There is no abdominal tenderness.  Musculoskeletal:     Right lower leg: No swelling.     Left lower leg: No swelling.  Skin:    General: Skin is warm.     Findings: No rash.  Neurological:     Mental Status: He is alert.     Data Reviewed: Creatinine 0.86, white blood cell count 9.9, hemoglobin 13.3, platelet count 260  Family  Communication: Tried to reach [facility] on the phone  Disposition: Status is: Inpatient Patient still on heated high flow nasal cannula  Planned Discharge Destination: LTAC TOC looking into this possibility.    Time spent: 28 minutes  Author: Alford Highland, MD 07/31/2023 2:15 PM  For on call review [email or web address].

## 2023-07-31 NOTE — Assessment & Plan Note (Addendum)
 Resolved.  Creatinine 2.0 on admission and creatinine 0.97 on last check.

## 2023-07-31 NOTE — Hospital Course (Addendum)
 68 year old male with a past medical history significant for COPD, alcohol dependence, and seizure disorder who presented to [facility] ED on 07/12/2023 from his group home due to altered mental status.  Per report he has been altered over the last 24 hours.   On arrival to the ED he remained somnolent and altered, oriented only to self.  Given his respiratory distress he was placed on BiPAP and eventually required intubation and levophed.  Positive for influenza A. Started on tamiflu.    07/15/23- patient passed SBT today.  He is liberated from MV on to [facility]. Remains with encephalopathy.  07/16/23- patient is improved overnight. He is on home psychiatric medications now.  3/8 -- weaned to 10 L/min HFNC O2 3/9 -- O2 sats decompensated early AM, transferred to stepdown, back on heated HF o2 at 70-80%FiO2 - resume IV steroids.  Negative RVP, normal BMP, normal LA, CXR neg 3/10 -- remains on heated HF O2 50 L/min 65% FiO2.  Negative CTA chest for PE, but showed new RLL infiltrate, possible and started on Unasyn 3/11 -- 40 L/min 60-65% fiO2 3/12.  Patient on heated high flow nasal cannula 49% oxygen this morning with 40 L flow.  Pulmonary reconsulted. 3/13.  Patient still on 47% oxygen and 40 L flow on heated high flow nasal cannula.  Patient had a nosebleed this morning requiring me to stop aspirin and Lovenox injections and give Afrin nasal spray. 3/14.  No further nosebleed.  Awaiting insurance authorization for LTAC.  Still on heated high flow nasal cannula around 51% oxygen and 40 L flow as of this morning. 3/15.  Patient down to 34% oxygen this am heated high flow nasal cannula when I saw him this morning on 40 L flow. 3/16.  Patient was up to 60% oxygen on heated high flow nasal cannula 50 L flow and respiratory therapist was able to get down to 45% oxygen 45 L flow. 3/17.  Patient was on 50% oxygen 45 L flow when I saw him today.  I had to do a peer to peer for LTAC approval.  Repeat chest x-ray still  showing infiltrates.  Will repeat MRSA PCR.  Repeat CRP and obtain a procalcitonin.

## 2023-07-31 NOTE — Assessment & Plan Note (Signed)
-  Continue Lamictal

## 2023-07-31 NOTE — Assessment & Plan Note (Signed)
 On admission

## 2023-07-31 NOTE — Progress Notes (Signed)
 Nutrition Follow-up  DOCUMENTATION CODES:   Non-severe (moderate) malnutrition in context of chronic illness  INTERVENTION:   -Continue regular diet -Continue Ensure Enlive po TID, each supplement provides 350 kcal and 20 grams of protein -Continue Magic cup TID with meals, each supplement provides 290 kcal and 9 grams of protein  -Continue MVI with minerals daily -Continue 1 mg folic acid daily -Continue 161 mg thiamine daily  NUTRITION DIAGNOSIS:   Moderate Malnutrition related to chronic illness as evidenced by severe fat depletion, severe muscle depletion.  Ongoing  GOAL:   Patient will meet greater than or equal to 90% of their needs  Progressing   MONITOR:   PO intake, Supplement acceptance, Labs, Weight trends, I & O's, Skin  REASON FOR ASSESSMENT:   Ventilator    ASSESSMENT:   68 y/o male with h/o seizures, etoh abuse, GERD, liver disease, OSA, COPD and resides in a group home who is admitted with acute metabolic encephalopathy, severe sepsis with septic shock, AKI, acute hypoxic and hypercapnic respiratory failure in the setting of acute COPD exacerbation due to influenza A infection and superimposed bacterial pneumonia requiring intubation and mechanical ventilation.  Reviewed I/O's: +80 ml x 24 hours and -16.3 L since 07/17/23  UOP: 970 ml x 24 hours  Pt remains on HHFNC.   Pt sitting up in bed at time of visit. Pt more talkative in comparison to prior visit. Pt reports feeling better and with improved appetite. Noted meal completions 25-100%. Pt shares that he likes the Ensure supplements and continues to drink them. Pt excited that visitors have brought him in snacks, including cookies, cheeze its, and tootsie rolls. Noted pt occasionally consuming outside food brought in by visitors. Pt thanked this RD for checking in on him.   Wt has been stable over the past week.   Medications reviewed and include solu-medrol, folic acid, and thiamine.   Per TOC  notes, pt awaiting insurance authorization for [facility] placement.   Palliative care following for goals of care discussions.   Labs reviewed: CBGS: 134 (inpatient orders for glycemic control are none).    Diet Order:   Diet Order             Diet regular Room service appropriate? Yes; Fluid consistency: Thin  Diet effective now                   EDUCATION NEEDS:   No education needs have been identified at this time  Skin:  Skin Assessment: Reviewed RN Assessment  Last BM:  07/30/23  Height:   Ht Readings from Last 1 Encounters:  07/12/23 5' 7.99" (1.727 m)    Weight:   Wt Readings from Last 1 Encounters:  07/31/23 62.7 kg    Ideal Body Weight:  70 kg  BMI:  Body mass index is 21.02 kg/m.  Estimated Nutritional Needs:   Kcal:  1900-2200kcal/day  Protein:  95-110g/day  Fluid:  1.8-2.1L/day    Levada Schilling, RD, LDN, CDCES Registered Dietitian III Certified Diabetes Care and Education Specialist If unable to reach this RD, please use "RD Inpatient" group chat on secure chat between hours of 8am-4 pm daily

## 2023-07-31 NOTE — Assessment & Plan Note (Signed)
 Present on admission and now has resolved.

## 2023-07-31 NOTE — TOC Progression Note (Signed)
 Transition of Care [facility]) - Progression Note    Patient Details  Name: Jeremiah Nguyen MRN: [id number] Date of Birth: 01-29-1956  Transition of [facility]) CM/SW Contact  Truddie Hidden, RN Phone Number: 07/31/2023, 2:19 PM  Clinical Narrative:    Attempt to reach patient's sister, Darl Pikes, number is not in working order.  Contacted patient's sister, [name]. She stated she is working and unable to take calls as needed. The family has decided the best point of contact is Anthony Sar, patient's sister. She can be reached at [phone]. [name] is also the decision maker and will update the rest of the family. [name] 's contact information provided to [facility] from [facility].   2:30pm Spoke with [name] regarding discharge plan for [facility]. She stated she felt the best place for patient is LTACH. RNCM advised patien'ts auth from his payor would have to be approved. [name] was advised Ciera from [facility] would be in contact with her.             Expected Discharge Plan and Services                                               Social Determinants of Health (SDOH) Interventions SDOH Screenings   Food Insecurity: Patient Unable To Answer (07/13/2023)  Housing: Patient Unable To Answer (07/13/2023)  Transportation Needs: Patient Unable To Answer (07/13/2023)  Utilities: Patient Unable To Answer (07/13/2023)  Depression (PHQ2-9): Low Risk  (02/25/2019)  Financial Resource Strain: Low Risk  (06/30/2018)  Physical Activity: Inactive (06/30/2018)  Social Connections: Patient Unable To Answer (07/13/2023)  Stress: Stress Concern Present (06/30/2018)  Tobacco Use: High Risk (07/12/2023)    Readmission Risk Interventions     No data to display

## 2023-07-31 NOTE — Assessment & Plan Note (Addendum)
 Patient on heated high flow nasal cannula 50% oxygen this morning with 45 L flow.  Appreciate pulmonary consultation.  Patient intubated during the hospital course.  Added inhalers on 3/12.  Will switch Solu-Medrol over to prednisone for tomorrow.  Incentive spirometry and MetaNeb treatments.  Awaiting insurance authorization for LTAC.  Repeat chest x-ray still showing infiltrates.  Will repeat an MRSA PCR, CRP and procalcitonin.  (Patient completed Rocephin and Zithromax earlier in the hospital course and Unasyn recently)

## 2023-07-31 NOTE — Assessment & Plan Note (Signed)
 Secondary to septic shock.

## 2023-07-31 NOTE — Assessment & Plan Note (Addendum)
 PPI to twice daily.  Patient has dilated fluid-filled esophagus which can increase risk of of aspiration.  Patient does not complain of any nausea or vomiting.  Wondering if patient has achalasia.  Requiring too much oxygen for any endoscopy procedure at this point.  Patient's sister told me that he has had hiccups in the past and used to vomit in order to make himself stop hiccuping.

## 2023-07-31 NOTE — Assessment & Plan Note (Signed)
 Continue supplements

## 2023-07-31 NOTE — Plan of Care (Signed)
  Problem: Activity: Goal: Ability to tolerate increased activity will improve Outcome: Progressing   Problem: Respiratory: Goal: Ability to maintain a clear airway and adequate ventilation will improve Outcome: Progressing   Problem: Role Relationship: Goal: Method of communication will improve Outcome: Progressing   Problem: Education: Goal: Knowledge of General Education information will improve Description: Including pain rating scale, medication(s)/side effects and non-pharmacologic comfort measures Outcome: Progressing   Problem: Health Behavior/Discharge Planning: Goal: Ability to manage health-related needs will improve Outcome: Progressing   Problem: Clinical Measurements: Goal: Ability to maintain clinical measurements within normal limits will improve Outcome: Progressing Goal: Will remain free from infection Outcome: Progressing Goal: Diagnostic test results will improve Outcome: Progressing Goal: Respiratory complications will improve Outcome: Progressing Goal: Cardiovascular complication will be avoided Outcome: Progressing   Problem: Activity: Goal: Risk for activity intolerance will decrease Outcome: Progressing   Problem: Nutrition: Goal: Adequate nutrition will be maintained Outcome: Progressing   Problem: Coping: Goal: Level of anxiety will decrease Outcome: Progressing   Problem: Elimination: Goal: Will not experience complications related to bowel motility Outcome: Progressing Goal: Will not experience complications related to urinary retention Outcome: Progressing   Problem: Pain Managment: Goal: General experience of comfort will improve and/or be controlled Outcome: Progressing   Problem: Safety: Goal: Ability to remain free from injury will improve Outcome: Progressing   Problem: Skin Integrity: Goal: Risk for impaired skin integrity will decrease Outcome: Progressing   Problem: Education: Goal: Ability to describe self-care  measures that may prevent or decrease complications (Diabetes Survival Skills Education) will improve Outcome: Progressing Goal: Individualized Educational Video(s) Outcome: Progressing   Problem: Coping: Goal: Ability to adjust to condition or change in health will improve Outcome: Progressing   Problem: Fluid Volume: Goal: Ability to maintain a balanced intake and output will improve Outcome: Progressing   Problem: Health Behavior/Discharge Planning: Goal: Ability to identify and utilize available resources and services will improve Outcome: Progressing Goal: Ability to manage health-related needs will improve Outcome: Progressing   Problem: Metabolic: Goal: Ability to maintain appropriate glucose levels will improve Outcome: Progressing   Problem: Nutritional: Goal: Maintenance of adequate nutrition will improve Outcome: Progressing Goal: Progress toward achieving an optimal weight will improve Outcome: Progressing   Problem: Skin Integrity: Goal: Risk for impaired skin integrity will decrease Outcome: Progressing   Problem: Tissue Perfusion: Goal: Adequacy of tissue perfusion will improve Outcome: Progressing

## 2023-07-31 NOTE — Progress Notes (Signed)
 Occupational Therapy Treatment Patient Details Name: Jeremiah Nguyen MRN: [id number] DOB: 1956-04-07 Today's Date: 07/31/2023   History of present illness Patient is a 68 year old male with history of COPD, alcohol dependence, and seizure disorder who presented on 07/12/2023 from group home due to altered mental status. Positive for influenza A, required mechanical ventilation, now on HHFNC.   OT comments  Upon entering the room, pt supine in bed and agreeable to OT intervention. He is agreeable to skilled co-treatment with PT to address functional deficits safely for pt and therapist. Pt needing min A of 2 for bed mobility and to stand <>sit from EOB. Pt seated on EOB wash face and blow nose with set up A and cues to return O2 to nares. Pt taking side steps along EOB towards R side with min HHA of 2. Pt returning to supine with call bell and all needed items within reach upon exiting the room.       If plan is discharge home, recommend the following:  Help with stairs or ramp for entrance;Supervision due to cognitive status;A lot of help with walking and/or transfers;A lot of help with bathing/dressing/bathroom   Equipment Recommendations  Other (comment) (defer to next venue of care)       Precautions / Restrictions Precautions Precautions: Fall Recall of Precautions/Restrictions: Impaired Precaution/Restrictions Comments: monitor Sp02       Mobility Bed Mobility Overal bed mobility: Needs Assistance Bed Mobility: Supine to Sit, Sit to Supine     Supine to sit: Min assist, +2 for physical assistance Sit to supine: Min assist, +2 for physical assistance        Transfers Overall transfer level: Needs assistance Equipment used: 2 person hand held assist Transfers: Sit to/from Stand Sit to Stand: +2 physical assistance, Min assist                 Balance Overall balance assessment: Needs assistance Sitting-balance support: Feet supported Sitting balance-Leahy Scale:  Good     Standing balance support: Bilateral upper extremity supported, During functional activity Standing balance-Leahy Scale: Fair                             ADL either performed or assessed with clinical judgement   ADL Overall ADL's : Needs assistance/impaired                                       General ADL Comments: Pt needing total A to don B socks at bed level. He washes face with set up A while seated on EOB.    Extremity/Trunk Assessment Upper Extremity Assessment Upper Extremity Assessment: Generalized weakness   Lower Extremity Assessment Lower Extremity Assessment: Generalized weakness        Vision Patient Visual Report: No change from baseline           Communication Communication Communication: Impaired Factors Affecting Communication: Difficulty expressing self   Cognition Arousal: Alert Behavior During Therapy: WFL for tasks assessed/performed Cognition: Cognition impaired, History of cognitive impairments                               Following commands: Impaired Following commands impaired: Follows one step commands with increased time      Cueing   Cueing Techniques: Verbal cues, Tactile cues  Exercises  Shoulder Instructions       General Comments      Pertinent Vitals/ Pain       Pain Assessment Pain Assessment: Faces Faces Pain Scale: No hurt         Frequency  Min 2X/week        Progress Toward Goals  OT Goals(current goals can now be found in the care plan section)  Progress towards OT goals: Progressing toward goals     Plan      Co-evaluation    PT/OT/SLP Co-Evaluation/Treatment: Yes Reason for Co-Treatment: Complexity of the patient's impairments (multi-system involvement);For patient/therapist safety PT goals addressed during session: Mobility/safety with mobility OT goals addressed during session: ADL's and self-care      AM-PAC OT "6 Clicks" Daily  Activity     Outcome Measure   Help from another person eating meals?: A Little Help from another person taking care of personal grooming?: A Little Help from another person toileting, which includes using toliet, bedpan, or urinal?: A Lot Help from another person bathing (including washing, rinsing, drying)?: A Lot Help from another person to put on and taking off regular upper body clothing?: A Little Help from another person to put on and taking off regular lower body clothing?: A Lot 6 Click Score: 15    End of Session Equipment Utilized During Treatment: Gait belt;Oxygen (45Ls HFNC)  OT Visit Diagnosis: Other abnormalities of gait and mobility (R26.89);Muscle weakness (generalized) (M62.81)   Activity Tolerance Patient tolerated treatment well   Patient Left in bed;with call bell/phone within reach;with bed alarm set   Nurse Communication Mobility status        Time: 8295-6213 OT Time Calculation (min): 23 min  Charges: OT General Charges $OT Visit: 1 Visit OT Treatments $Therapeutic Activity: 8-22 mins  Jackquline Denmark, MS, OTR/L , CBIS ascom [phone]  07/31/23, 3:39 PM

## 2023-07-31 NOTE — Progress Notes (Signed)
 Physical Therapy Treatment Patient Details Name: Jeremiah Nguyen MRN: [id number] DOB: 1955-06-24 Today's Date: 07/31/2023   History of Present Illness Patient is a 68 year old male with history of COPD, alcohol dependence, and seizure disorder who presented on 07/12/2023 from group home due to altered mental status. Positive for influenza A, required mechanical ventilation, now on HHFNC.    PT Comments  PT/OT co-treat this date. Pt is making good progress towards goals with ability to demonstrate decreased physical assist for all mobility this date. Goals updated this session. Still remains at 40L of HFNC with quick desat with exertion. Pt complains of the high flow mask and frequently removes with cues for keeping on. Able to sit at EOB for prolonged time-encouraged to sit at EOB 2x/day with family/nursing/staff. Checklist on board to facilitate. Would be excellent candidate for mobility referral. Will continue to progress as able.   If plan is discharge home, recommend the following: Two people to help with walking and/or transfers;A lot of help with bathing/dressing/bathroom;Assist for transportation;Help with stairs or ramp for entrance;Supervision due to cognitive status;Assistance with cooking/housework;Assistance with feeding;Direct supervision/assist for medications management;Direct supervision/assist for financial management   Can travel by private vehicle     No  Equipment Recommendations   (TBD)    Recommendations for Other Services       Precautions / Restrictions Precautions Precautions: Fall Recall of Precautions/Restrictions: Impaired Precaution/Restrictions Comments: monitor Sp02 Restrictions Weight Bearing Restrictions Per Provider Order: No     Mobility  Bed Mobility Overal bed mobility: Needs Assistance Bed Mobility: Supine to Sit, Sit to Supine     Supine to sit: Min assist, +2 for physical assistance Sit to supine: Min assist, +2 for physical assistance    General bed mobility comments: responds well to verbal cues and is able to initiate task. Once seated at EOB, upright posture noted    Transfers Overall transfer level: Needs assistance Equipment used: 2 person hand held assist Transfers: Sit to/from Stand Sit to Stand: +2 physical assistance, Min assist           General transfer comment: flexed posture. All mobility performed on 40L of HFNC. 2 sit<>Stands performed    Ambulation/Gait Ambulation/Gait assistance: Min assist, +2 physical assistance Gait Distance (Feet): 3 Feet Assistive device: 2 person hand held assist Gait Pattern/deviations: Step-to pattern       General Gait Details: able to take lateral steps up towards HOB. O2 sats decrease quickly. Cues for PLB.   Stairs             Wheelchair Mobility     Tilt Bed    Modified Rankin (Stroke Patients Only)       Balance Overall balance assessment: Needs assistance Sitting-balance support: Feet supported Sitting balance-Leahy Scale: Good     Standing balance support: Bilateral upper extremity supported, During functional activity Standing balance-Leahy Scale: Fair                              Hotel manager: Impaired Factors Affecting Communication: Difficulty expressing self  Cognition Arousal: Alert Behavior During Therapy: WFL for tasks assessed/performed   PT - Cognitive impairments: No apparent impairments, Memory, Sequencing                       PT - Cognition Comments: very flat affect, however agreeable to session Following commands: Impaired      Cueing Cueing Techniques: Verbal cues,  Tactile cues  Exercises Other Exercises Other Exercises: seated ADL with OT    General Comments        Pertinent Vitals/Pain Pain Assessment Pain Assessment: No/denies pain    Home Living                          Prior Function            PT Goals (current goals can now be  found in the care plan section) Acute Rehab PT Goals Patient Stated Goal: none stated PT Goal Formulation: With patient Time For Goal Achievement: 08/14/23 Potential to Achieve Goals: Fair Progress towards PT goals: Progressing toward goals    Frequency    Min 2X/week      PT Plan      Co-evaluation PT/OT/SLP Co-Evaluation/Treatment: Yes Reason for Co-Treatment: Complexity of the patient's impairments (multi-system involvement);For patient/therapist safety PT goals addressed during session: Mobility/safety with mobility OT goals addressed during session: ADL's and self-care      AM-PAC PT "6 Clicks" Mobility   Outcome Measure  Help needed turning from your back to your side while in a flat bed without using bedrails?: A Lot Help needed moving from lying on your back to sitting on the side of a flat bed without using bedrails?: A Lot Help needed moving to and from a bed to a chair (including a wheelchair)?: Total Help needed standing up from a chair using your arms (e.g., wheelchair or bedside chair)?: Total Help needed to walk in hospital room?: Total Help needed climbing 3-5 steps with a railing? : Total 6 Click Score: 8    End of Session Equipment Utilized During Treatment: Oxygen Activity Tolerance: Patient tolerated treatment well;Patient limited by fatigue Patient left: in bed;with call bell/phone within reach;with bed alarm set Nurse Communication: Mobility status PT Visit Diagnosis: Muscle weakness (generalized) (M62.81);Unsteadiness on feet (R26.81)     Time: 1610-9604 PT Time Calculation (min) (ACUTE ONLY): 23 min  Charges:    $Therapeutic Activity: 8-22 mins PT General Charges $$ ACUTE PT VISIT: 1 Visit                     Elizabeth Palau, PT, DPT, GCS [phone]    Jeremiah Nguyen 07/31/2023, 4:30 PM

## 2023-08-01 DIAGNOSIS — K219 Gastro-esophageal reflux disease without esophagitis: Secondary | ICD-10-CM | POA: Diagnosis not present

## 2023-08-01 DIAGNOSIS — E440 Moderate protein-calorie malnutrition: Secondary | ICD-10-CM | POA: Diagnosis not present

## 2023-08-01 DIAGNOSIS — N179 Acute kidney failure, unspecified: Secondary | ICD-10-CM | POA: Diagnosis not present

## 2023-08-01 DIAGNOSIS — Z515 Encounter for palliative care: Secondary | ICD-10-CM | POA: Diagnosis not present

## 2023-08-01 DIAGNOSIS — J9601 Acute respiratory failure with hypoxia: Secondary | ICD-10-CM | POA: Diagnosis not present

## 2023-08-01 DIAGNOSIS — G9341 Metabolic encephalopathy: Secondary | ICD-10-CM | POA: Diagnosis not present

## 2023-08-01 LAB — C-REACTIVE PROTEIN: CRP: 7.9 mg/dL — ABNORMAL HIGH (ref <1.0)

## 2023-08-01 MED ORDER — ACETYLCYSTEINE 20 % IN SOLN
4.0000 mL | Freq: Two times a day (BID) | RESPIRATORY_TRACT | Status: DC
  Administered 2023-08-01 – 2023-08-04 (×7): 4 mL via RESPIRATORY_TRACT
  Filled 2023-08-01 (×8): qty 4

## 2023-08-01 MED ORDER — OXYMETAZOLINE HCL 0.05 % NA SOLN
4.0000 | Freq: Two times a day (BID) | NASAL | Status: AC | PRN
  Filled 2023-08-01: qty 15

## 2023-08-01 NOTE — Progress Notes (Signed)
 Progress Note   [name] ZOX:[id number] DOB: Oct 30, 1955 DOA: 07/12/2023     20 DOS: the patient was seen and examined on 08/01/2023   Brief hospital course: 68 year old male with a past medical history significant for COPD, alcohol dependence, and seizure disorder who presented to [facility] ED on 07/12/2023 from his group home due to altered mental status.  Per report he has been altered over the last 24 hours.   On arrival to the ED he remained somnolent and altered, oriented only to self.  Given his respiratory distress he was placed on BiPAP and eventually required intubation and levophed.  Positive for influenza A. Started on tamiflu.    07/15/23- patient passed SBT today.  He is liberated from MV on to [facility]. Remains with encephalopathy.  07/16/23- patient is improved overnight. He is on home psychiatric medications now.  3/8 -- weaned to 10 L/min HFNC O2 3/9 -- O2 sats decompensated early AM, transferred to stepdown, back on heated HF o2 at 70-80%FiO2 - resume IV steroids.  Negative RVP, normal BMP, normal LA, CXR neg 3/10 -- remains on heated HF O2 50 L/min 65% FiO2.  Negative CTA chest for PE, but showed new RLL infiltrate, possible and started on Unasyn 3/11 -- 40 L/min 60-65% fiO2 3/12.  Patient on heated high flow nasal cannula 49% oxygen this morning with 40 L flow.  Pulmonary reconsulted. 3/13.  Patient still on 47% oxygen and 40 L flow on heated high flow nasal cannula.  Patient had a nosebleed this morning requiring me to stop aspirin and Lovenox injections and give Afrin nasal spray.   Assessment and Plan: * Acute respiratory failure with hypoxia and hypercapnia (HCC) Patient on heated high flow nasal cannula 49% oxygen this morning with 15 L flow.  Since the patient has been here 19 days and still on heated high flow nasal cannula will reconsult pulmonary.  Patient intubated during the hospital course.  Added inhalers.  Patient on Solu-Medrol and Unasyn.  AKI (acute  kidney injury) (HCC) Resolved.  Creatinine 2.0 on admission and creatinine 0.86 on last check.  Acute metabolic encephalopathy Secondary to septic shock  Influenza A with pneumonia On admission  Malnutrition of moderate degree Continue supplements  Gastroesophageal reflux disease Increase PPI to twice daily patient has dilated fluid-filled esophagus which can increase risk of of aspiration.  Patient does not complain of any nausea or vomiting.  Wondering if patient has achalasia.  Requiring too much oxygen for any endoscopy procedure at this point  Seizures (HCC) Continue Lamictal  Septic shock (HCC) Present on admission and now has resolved.        Subjective: Patient seen this morning and again this afternoon.  Patient sat up with breakfast today.  Still on heated high flow nasal cannula.  Admitted with septic shock.  Physical Exam: Vitals:   08/01/23 0500 08/01/23 0827 08/01/23 0943 08/01/23 1137  BP:   (!) 91/54 (!) 112/59  Pulse:   (!) 110 96  Resp:      Temp:   99.9 F (37.7 C) 99.5 F (37.5 C)  TempSrc:      SpO2:  90% 91% 93%  Weight: 63.1 kg     Height:       Physical Exam HENT:     Head: Normocephalic.     Mouth/Throat:     Pharynx: No oropharyngeal exudate.  Eyes:     General: Lids are normal.     Conjunctiva/sclera: Conjunctivae normal.  Cardiovascular:  Rate and Rhythm: Normal rate and regular rhythm.     Heart sounds: Normal heart sounds, S1 normal and S2 normal.  Pulmonary:     Breath sounds: Examination of the right-lower field reveals decreased breath sounds. Examination of the left-lower field reveals decreased breath sounds. Decreased breath sounds present. No wheezing, rhonchi or rales.  Abdominal:     Palpations: Abdomen is soft.     Tenderness: There is no abdominal tenderness.  Musculoskeletal:     Right lower leg: No swelling.     Left lower leg: No swelling.  Skin:    General: Skin is warm.     Findings: No rash.   Neurological:     Mental Status: He is alert.     Data Reviewed: Creatinine 0.86, CBC within normal range.  Family Communication: Spoke with sister at the bedside  Disposition: Status is: Inpatient Remains inpatient appropriate because: Still on heated high flow nasal cannula.  Looking into LTAC facility  Planned Discharge Destination: LTAC    Time spent: 28 minutes  Author: Alford Highland, MD 08/01/2023 1:26 PM  For on call review [email or web address].

## 2023-08-01 NOTE — Consult Note (Signed)
 PULMONOLOGY         Date: 08/01/2023,   MRN# [id number] Jeremiah Nguyen [birth year]     AdmissionWeight: 68 kg                 CurrentWeight: 63.1 kg  Referring provider: Dr Renae Gloss   CHIEF COMPLAINT:   Acute on chronic hypoxemic respiratory failure   HISTORY OF PRESENT ILLNESS   This is a 68 yo M with hx of COPD, seizure disorder, liver disease, GERD who intially came in 06/2023 with AECOPD with influenza A infection.  He was found to be hypoxemic and altered with encephalopathy. He was treated for bacterial pneumonia and required ICU hospitalization. He developed septic shock and was intubated placed on MV and was on vasopressor support while in intensive care. He eventually improved and was weaned from MV.  At baseline he does have psychiatric disease and his home regimen was restarted.  He also developed component of metabolic encephalopathy presumably from renal impairment. Despite full scope of therapy he continues to require HFNC support and is on 50%/40L.  He does use IS but seems to be labored and at times regurgitates whittish phlegm. He had CT chest PE protocol performed 07/29/23 , I reviewed this independently and placed pictorial documentation below. PCCM consultation for further evaluation and management.    PAST MEDICAL HISTORY   Past Medical History:  Diagnosis Date   COPD (chronic obstructive pulmonary disease) (HCC)    Eating disorder    GERD (gastroesophageal reflux disease)    Liver disease    Seizures (HCC)    Sleep apnea      SURGICAL HISTORY   Past Surgical History:  Procedure Laterality Date   COLONOSCOPY WITH PROPOFOL N/A 12/05/2020   Procedure: COLONOSCOPY WITH PROPOFOL;  Surgeon: Regis Bill, MD;  Location: [facility] ENDOSCOPY;  Service: Endoscopy;  Laterality: N/A;   ESOPHAGOGASTRODUODENOSCOPY (EGD) WITH PROPOFOL N/A 02/25/2018   Procedure: ESOPHAGOGASTRODUODENOSCOPY (EGD) WITH PROPOFOL;  Surgeon: Wyline Mood, MD;  Location: [facility]  ENDOSCOPY;  Service: Gastroenterology;  Laterality: N/A;   LESION EXCISION Left 12-15-13   follicular cyst   SKIN BIOPSY       FAMILY HISTORY   Family History  Problem Relation Age of Onset   Cancer Mother        breast     SOCIAL HISTORY   Social History   Tobacco Use   Smoking status: Every Day    Current packs/day: 0.50    Average packs/day: 0.5 packs/day for 30.0 years (15.0 ttl pk-yrs)    Types: Cigarettes   Smokeless tobacco: Never  Vaping Use   Vaping status: Never Used  Substance Use Topics   Alcohol use: No   Drug use: No     MEDICATIONS    Home Medication:    Current Medication:  Current Facility-Administered Medications:    acetaminophen (TYLENOL) tablet 650 mg, 650 mg, Oral, Q4H PRN, Mansy, Jan A, MD, 650 mg at 07/29/23 0828   Ampicillin-Sulbactam (UNASYN) 3 g in sodium chloride 0.9 % 100 mL IVPB, 3 g, Intravenous, Q6H, Hunt, Madison H, RPH, Last Rate: 200 mL/hr at 08/01/23 0405, 3 g at 08/01/23 0405   aspirin EC tablet 81 mg, 81 mg, Oral, Daily, Leeroy Bock, MD, 81 mg at 07/31/23 1030   benzonatate (TESSALON) capsule 100 mg, 100 mg, Oral, TID, Renae Gloss, Richard, MD, 100 mg at 07/31/23 2210   Chlorhexidine Gluconate Cloth 2 % PADS 6 each, 6 each, Topical, Daily, Esaw Grandchild  A, DO, 6 each at 07/31/23 1010   chlorproMAZINE (THORAZINE) tablet 50 mg, 50 mg, Oral, QID, Leeroy Bock, MD, 50 mg at 07/31/23 2216   enoxaparin (LOVENOX) injection 40 mg, 40 mg, Subcutaneous, QHS, Kasa, Wallis Bamberg, MD, 40 mg at 07/31/23 2211   feeding supplement (ENSURE ENLIVE / ENSURE PLUS) liquid 237 mL, 237 mL, Oral, TID BM, Karna Christmas, Ednah Hammock, MD, 237 mL at 07/31/23 2208   fluticasone furoate-vilanterol (BREO ELLIPTA) 100-25 MCG/ACT 1 puff, 1 puff, Inhalation, Daily, Wieting, Richard, MD, 1 puff at 07/31/23 1227   folic acid (FOLVITE) tablet 1 mg, 1 mg, Oral, Daily, Karna Christmas, Lafawn Lenoir, MD, 1 mg at 07/31/23 1031   guaiFENesin (ROBITUSSIN) 100 MG/5ML liquid 5 mL, 5 mL,  Oral, Q4H PRN, Renae Gloss, Richard, MD   ipratropium-albuterol (DUONEB) 0.5-2.5 (3) MG/3ML nebulizer solution 3 mL, 3 mL, Nebulization, Q4H PRN, Esaw Grandchild A, DO, 3 mL at 07/27/23 2331   lamoTRIgine (LAMICTAL) tablet 150 mg, 150 mg, Oral, BID, Rust-Chester, Britton L, NP, 150 mg at 07/31/23 2210   methylPREDNISolone sodium succinate (SOLU-MEDROL) 40 mg/mL injection 40 mg, 40 mg, Intravenous, Q24H, Esaw Grandchild A, DO, 40 mg at 07/31/23 1032   metoprolol succinate (TOPROL-XL) 24 hr tablet 25 mg, 25 mg, Oral, Daily, Esaw Grandchild A, DO, 25 mg at 07/31/23 1031   multivitamin with minerals tablet 1 tablet, 1 tablet, Oral, Daily, Gilman Olazabal, MD, 1 tablet at 07/31/23 1030   nicotine (NICODERM CQ - dosed in mg/24 hours) patch 14 mg, 14 mg, Transdermal, Daily, Ezequiel Essex, NP, 14 mg at 07/31/23 1032   ondansetron (ZOFRAN) injection 4 mg, 4 mg, Intravenous, Q6H PRN, Esaw Grandchild A, DO, 4 mg at 07/31/23 2159   Oral care mouth rinse, 15 mL, Mouth Rinse, PRN, Esaw Grandchild A, DO   pantoprazole (PROTONIX) EC tablet 40 mg, 40 mg, Oral, BID, Wieting, Richard, MD, 40 mg at 07/31/23 2210   polyethylene glycol (MIRALAX / GLYCOLAX) packet 17 g, 17 g, Oral, BID, Wieting, Richard, MD, 17 g at 07/31/23 2211   senna (SENOKOT) tablet 17.2 mg, 2 tablet, Oral, QHS, Wieting, Richard, MD, 17.2 mg at 07/31/23 2210   sertraline (ZOLOFT) tablet 25 mg, 25 mg, Oral, Daily, Yanette Tripoli, MD, 25 mg at 07/31/23 1030   thiamine (VITAMIN B1) tablet 100 mg, 100 mg, Oral, Daily, Rust-Chester, Britton L, NP, 100 mg at 07/31/23 1033   traZODone (DESYREL) tablet 50 mg, 50 mg, Oral, QHS PRN, Rust-Chester, Britton L, NP, 50 mg at 07/30/23 2141   umeclidinium bromide (INCRUSE ELLIPTA) 62.5 MCG/ACT 1 puff, 1 puff, Inhalation, Daily, Renae Gloss, Richard, MD, 1 puff at 07/31/23 1227    ALLERGIES   Patient has no known allergies.     REVIEW OF SYSTEMS    Review of Systems:  Gen:  Denies  fever, sweats, chills weigh  loss  HEENT: Denies blurred vision, double vision, ear pain, eye pain, hearing loss, nose bleeds, sore throat Cardiac:  No dizziness, chest pain or heaviness, chest tightness,edema Resp:   reports dyspnea chronically  Gi: Denies swallowing difficulty, stomach pain, nausea or vomiting, diarrhea, constipation, bowel incontinence Gu:  Denies bladder incontinence, burning urine Ext:   Denies Joint pain, stiffness or swelling Skin: Denies  skin rash, easy bruising or bleeding or hives Endoc:  Denies polyuria, polydipsia , polyphagia or weight change Psych:   Denies depression, insomnia or hallucinations   Other:  All other systems negative   VS: BP 114/63 (BP Location: Right Arm)   Pulse 94   Temp  98.3 F (36.8 C)   Resp 18   Ht 5' 7.99" (1.727 m)   Wt 63.1 kg   SpO2 91%   BMI 21.16 kg/m      PHYSICAL EXAM    GENERAL:NAD, no fevers, chills, no weakness no fatigue HEAD: Normocephalic, atraumatic.  EYES: Pupils equal, round, reactive to light. Extraocular muscles intact. No scleral icterus.  MOUTH: Moist mucosal membrane. Dentition intact. No abscess noted.  EAR, NOSE, THROAT: Clear without exudates. No external lesions.  NECK: Supple. No thyromegaly. No nodules. No JVD.  PULMONARY: decreased breath sounds with mild rhonchi worse at bases bilaterally.  CARDIOVASCULAR: S1 and S2. Regular rate and rhythm. No murmurs, rubs, or gallops. No edema. Pedal pulses 2+ bilaterally.  GASTROINTESTINAL: Soft, nontender, nondistended. No masses. Positive bowel sounds. No hepatosplenomegaly.  MUSCULOSKELETAL: No swelling, clubbing, or edema. Range of motion full in all extremities.  NEUROLOGIC: Cranial nerves II through XII are intact. No gross focal neurological deficits. Sensation intact. Reflexes intact.  SKIN: No ulceration, lesions, rashes, or cyanosis. Skin warm and dry. Turgor intact.  PSYCHIATRIC: Mood, affect within normal limits. The patient is awake, alert and oriented x 3. Insight,  judgment intact.       IMAGING     CT CHEST WITH BILATERAL BRONCHITIC CHANGES, ANTERIORLY ON RIGHT THERE IS A LARGE BULLA AT RIGHT LOWER LOBE, POSTERIORLY THERE IS ATELECTASIS AT LEFT LOWER LOBE.    ASSESSMENT/PLAN   Acute on chronic hypoxemic respiratory failure     - noted residual bronchitic changes - agree with current solumedrol       - agree with unasyn - patient noted to regurgitate while using incentive spirometry and may have episodic aspirations      -continue COPD care path with neb treatments       -initiate mucomyst 20% - 4ml BID  COPD-chronic with exacerbation post influenza -continue solumedrol 40mg  -BNP is normal unlikely overlap of CHF -continue current antimicrobials   Atelectasis   - worse at LLL  Starting metaneb with albuterol q8h x 3 d   Tobacco dependence   Continue nicotine replacement   OSA - CPAP at bedtime with setting by RT               Thank you for allowing me to participate in the care of this patient.   Patient/Family are satisfied with care plan and all questions have been answered.    Provider disclosure: Patient with at least one acute or chronic illness or injury that poses a threat to life or bodily function and is being managed actively during this encounter.  All of the below services have been performed independently by signing provider:  review of prior documentation from internal and or external health records.  Review of previous and current lab results.  Interview and comprehensive assessment during patient visit today. Review of current and previous chest radiographs/CT scans. Discussion of management and test interpretation with health care team and patient/family.   This document was prepared using Dragon voice recognition software and may include unintentional dictation errors.     Vida Rigger, M.D.  Division of Pulmonary & Critical Care Medicine

## 2023-08-01 NOTE — Progress Notes (Signed)
 Pt is noted with nosebleed, pressure is applied and MD is notified. Per MD, hold asa lovenox and afrin nasal sray 4 sprays in bleeding nostril. Will continue to monitor and control bleed.

## 2023-08-01 NOTE — Progress Notes (Signed)
 Family member [name] is at the bedside and wants an update on patient status. As this nurse review labs and vitals with [name], she yells at the patient " ARE YOU READY TO GO SEE GOD". Patient is visibly uneasy and began to shake his leg and tremors. His eyes began to water as [name] speaks to him. As this nurse intervenes to go over improvement of health with Gwenlyn Perking points her finger at this nurse and yells, " I am not talking to you, I am talking to him because he do not need to be babied like he is stupid". The encounter is so loud that the unit's charge Nurse, Metro Kung enters the room and assist with de-escalation of family member's tone and verbal attacks on the patient. Family member is asked to leave the bedside as it is upsetting to the patient. Comfort is given to the patient by this nurse after the family leaves. Patient expresses appreciation of staff intervention.

## 2023-08-01 NOTE — Progress Notes (Signed)
 Palliative Care Progress Note, Assessment & Plan   Patient Name: Jeremiah Nguyen       Date: 08/01/2023 DOB: February 20, 1956  Age: 68 y.o. MRN#: [id number] Attending Physician: Alford Highland, MD Primary Care Physician: Armando Gang, FNP Admit Date: 07/12/2023  Subjective: Patient is lying in bed in mild respiratory distress.  His high flow nasal cannula is not in place.  I replaced it.  No family or friends present during my visit.  I remained bedside while patient recovered.  Respirations were even and unlabored once high flow nasal cannula was back in place.  HPI: 68 y.o. male  with past medical history of COPD, current smoker, alcohol dependence, schizophrenia and seizure disorder admitted from group home on 07/12/2023 with altered mental status.   Found to be influenza A positive with pneumonia and septic shock-required mechanical ventilation and vasopressor support, successfully extubated 2/24, has remained encephalopathic, slow weaning of supplemental oxygen Has completed course of antibiotic therapy. Awaiting insurance approval for LTACH.    Palliative medicine was consulted for assisting with goals of care conversations  Summary of counseling/coordination of care: Extensive chart review completed prior to meeting patient including labs, vital signs, imaging, progress notes, orders, and available advanced directive documents from current and previous encounters.   After reviewing the patient's chart and assessing the patient at bedside, I spoke with patient in regards to symptom management and goals of care.  Him Somes assessed.  I found patient with mild shortness of breath.  I asked what happened with his nasal cannula.  He said he took it off because "it was annoying me".  Discussed importance of  supplemental oxygen to sustain patient's breathing.  He says he understands but just does not like to wear it.  Therapeutic silence, active listening, and emotional support provided.  Discussed other acute issues such as headache, chest pain, or pain.  Patient denies.  No adjustment to [facility] needed.  I attempted to discuss values and goals important to the patient.  He says "I do not know what you are talking about".  He remains unable to participate in goals of care medical decision making independently at this time.  After visiting with the patient, I spoke with his Jeremiah Nguyen over the phone.  Archie Patten shares that her Sister Jeremiah Nguyen is the point of contact for patient.  She declined a medical update saying that she did not want me to repeat it twice but that I should speak with Jeremiah Nguyen.  I then spoke with patient's Sister Jeremiah Nguyen over the phone.  Described PMT's role in patient's care.  Brief medical update given, including discussion of nosebleeds.  Jeremiah Nguyen shares patient has been prone to nosebleeds since he was a child.  I attempted to elicit values and goals important to Rennert.  She shares that she does not believe the patient "should be babied".  She shares he is a "grown man" that sometimes acts "like he is stupid just to be spiteful".  She shares she believes that he can do more for himself than he is letting on.    I shared that we are encouraging patient to be as independent as possible but that he is requiring high levels  of oxygen, which limit his energy, mobility, functional status.  Reviewed importance of adherence to supplemental oxygen in order to improve overall prognosis.  She shares she will be at the hospital this afternoon and plans to speak with him about keeping the oxygen in place.  I attempted to discuss other boundaries of care with Jeremiah Nguyen.  However, she continued to discussed that patient should not be pamper and that he has to do for himself if he wants to do better. She chose to focus  on keeping his oxygen in place despite my efforts to discuss other advanced care planning elements. She was at work and shared she did not want to take up any more of my time. She was in agreement for me to speak with her again at a later date/time.   Jeremiah Nguyen was appreciative of my call.  Ongoing discussion of boundaries and goals of care if needed as code status and what is important to patient/family has yet to be clarified.  PMT will continue to follow and support patient and family throughout his hospitalization.  No change to plan of care at this time.  Physical Exam Vitals reviewed.  Constitutional:      Appearance: He is not ill-appearing.  HENT:     Head: Normocephalic.     Mouth/Throat:     Mouth: Mucous membranes are moist.  Eyes:     Pupils: Pupils are equal, round, and reactive to light.  Pulmonary:     Comments: HFNC Musculoskeletal:     Comments: Generalized weakness  Skin:    General: Skin is warm and dry.  Neurological:     Mental Status: He is alert.     Comments: Oriented to self  Psychiatric:        Mood and Affect: Mood normal.        Behavior: Behavior normal.        Thought Content: Thought content normal.        Judgment: Judgment normal.             Total Time 35 minutes   Time spent includes: Detailed review of medical records (labs, imaging, vital signs), medically appropriate exam (mental status, respiratory, cardiac, skin), discussed with treatment team, counseling and educating patient, family and staff, documenting clinical information, medication management and coordination of care.  Samara Deist L. Bonita Quin, DNP, FNP-BC Palliative Medicine Team

## 2023-08-01 NOTE — Plan of Care (Signed)
  Problem: Activity: Goal: Ability to tolerate increased activity will improve Outcome: Progressing   Problem: Role Relationship: Goal: Method of communication will improve Outcome: Progressing   Problem: Clinical Measurements: Goal: Ability to maintain clinical measurements within normal limits will improve Outcome: Progressing Goal: Will remain free from infection Outcome: Progressing

## 2023-08-01 NOTE — Progress Notes (Signed)
 Ice pack applied to bridge of nose per MD request.

## 2023-08-02 DIAGNOSIS — J9601 Acute respiratory failure with hypoxia: Secondary | ICD-10-CM | POA: Diagnosis not present

## 2023-08-02 DIAGNOSIS — G9341 Metabolic encephalopathy: Secondary | ICD-10-CM | POA: Diagnosis not present

## 2023-08-02 DIAGNOSIS — J9602 Acute respiratory failure with hypercapnia: Secondary | ICD-10-CM | POA: Diagnosis not present

## 2023-08-02 DIAGNOSIS — Z515 Encounter for palliative care: Secondary | ICD-10-CM | POA: Diagnosis not present

## 2023-08-02 DIAGNOSIS — N179 Acute kidney failure, unspecified: Secondary | ICD-10-CM | POA: Diagnosis not present

## 2023-08-02 DIAGNOSIS — E440 Moderate protein-calorie malnutrition: Secondary | ICD-10-CM | POA: Diagnosis not present

## 2023-08-02 NOTE — Progress Notes (Signed)
 Palliative Care Progress Note, Assessment & Plan   Patient Name: Jeremiah Nguyen       Date: 08/02/2023 DOB: March 04, 1956  Age: 68 y.o. MRN#: [id number] Attending Physician: Alford Highland, MD Primary Care Physician: Armando Gang, FNP Admit Date: 07/12/2023  Subjective: Patient is sitting at edge of bed and working with PT.  He is preparing to move to recliner.  He acknowledges my presence and is able to make his wishes known.  He has no acute complaints at this time.  High flow nasal cannula remains in place.  No family or friends present during my visit.  HPI: 68 y.o. male  with past medical history of COPD, current smoker, alcohol dependence, schizophrenia and seizure disorder admitted from group home on 07/12/2023 with altered mental status.   Found to be influenza A positive with pneumonia and septic shock-required mechanical ventilation and vasopressor support, successfully extubated 2/24, has remained encephalopathic, slow weaning of supplemental oxygen Has completed course of antibiotic therapy. Awaiting insurance approval for LTACH.    Palliative medicine was consulted for assisting with goals of care conversations  Summary of counseling/coordination of care: Extensive chart review completed prior to meeting patient including labs, vital signs, imaging, progress notes, orders, and available advanced directive documents from current and previous encounters.   After reviewing the patient's chart and assessing the patient at bedside, I spoke with patient in regards to symptom management and goals of care.   Symptoms assessed.  Patient endorses that he feels fine.  PT endorses that he drops his oxygen saturations with movement but is able to quickly recover to the high 80s.  He denies chest pain,  headache, palpitations, or other issues with movement other than feeling short of breath.  No adjustment to [facility] needed.  After assessing the patient, I spoke with his Sister Jeremiah Nguyen over the phone.  Jeremiah Nguyen shares that she was trying to give her brother "tough love" when she visited yesterday.  She shares that since security was called on her, she does not plan to return to the hospital to visit with him.  She shares she will remain in contact as his point person.  I discussed advanced care planning and CODE STATUS with Jeremiah Nguyen.  Reviewed that patient is a full code.  Discussed difference between full code, DNR with full interventions, and DNR with limited interventions.  Jeremiah Nguyen shares that she is going to send a message to all of the other siblings to gather their thoughts on patient's boundaries of care.  Jeremiah Nguyen shares that she would like time to speak with her family over the weekend.  She shares she will return my call and give me an update on what the family consensus is on Monday.  PMT contact info given.  No adjustment to plan of care at this time.  Plan remains for patient to go to LTAC if insurance authorization approved.  I am off service until Tuesday but will ensure one of my palliative colleagues can follow-up on Monday.  Physical Exam Vitals reviewed.  Constitutional:      General: He is not in acute distress.    Appearance: He is normal weight.  HENT:     Head: Normocephalic.  Nose: Nose normal.     Mouth/Throat:     Mouth: Mucous membranes are moist.  Eyes:     Pupils: Pupils are equal, round, and reactive to light.  Cardiovascular:     Rate and Rhythm: Normal rate.  Pulmonary:     Comments: HFNC in place Abdominal:     Palpations: Abdomen is soft.  Musculoskeletal:     Comments: Dyspnea with exertion MAETC  Skin:    General: Skin is warm and dry.  Neurological:     Mental Status: He is alert.     Comments: Oriented to self  Psychiatric:        Mood and Affect:  Mood normal.        Behavior: Behavior normal.        Thought Content: Thought content normal.        Judgment: Judgment normal.             Total Time 25 minutes   Time spent includes: Detailed review of medical records (labs, imaging, vital signs), medically appropriate exam (mental status, respiratory, cardiac, skin), discussed with treatment team, counseling and educating patient, family and staff, documenting clinical information, medication management and coordination of care.  Jeremiah Deist L. Jeremiah Quin, DNP, FNP-BC Palliative Medicine Team

## 2023-08-02 NOTE — Progress Notes (Signed)
 Pharmacy Antibiotic Note  Jeremiah Nguyen is a 68 y.o. male admitted on 07/12/2023 with  aspiration pneumonia .  Pharmacy has been consulted for Unasyn dosing.  Today, 07/29/23 3/9 Respiratory panel negative, COVID negative HHFNC 40 L/min w/ FiO2 48% Afebrile 3/10 CT Chest: No PE, faint tree-in-bud opacities in the right lower lobe, likely infectious/inflammatory. Aspiration is considered. Bilateral lower lobe atelectasis improved from CT last month  Plan: Continue Unasyn 3 g IV Q6H. Therapy discussed with MD. End date added in epic Continue to monitor renal function and follow culture results   Height: 5' 7.99" (172.7 cm) Weight: 64.2 kg (141 lb 8.6 oz) IBW/kg (Calculated) : 68.38  Temp (24hrs), Avg:98.9 F (37.2 C), Min:98.4 F (36.9 C), Max:99.8 F (37.7 C)  Recent Labs  Lab 07/27/23 0916 07/28/23 0901 07/30/23 0453  WBC 7.2  --  9.9  CREATININE 0.89  --  0.86  LATICACIDVEN  --  0.8  --     Estimated Creatinine Clearance: 74.7 mL/min (by C-G formula based on SCr of 0.86 mg/dL).    No Known Allergies  Antimicrobials this admission: Azithromycin 2/21-2/25  Ceftriaxone 2/21-2/25 Oseltamivir 2/21-2/26 Unasyn>>  Microbiology results: 2/21 Bcx: NGTD 2/21 MRSA PCR: negative 2/23 Trach aspirate: few candida albicans 3/9 Respiratory panel: negative  Jeremiah Nguyen PharmD, BCPS 08/02/2023 12:45 PM

## 2023-08-02 NOTE — TOC Progression Note (Signed)
 Transition of Care [facility]) - Progression Note    Patient Details  Name: Jeremiah Nguyen MRN: [id number] Date of Birth: 03-09-1956  Transition of [facility]) CM/SW Contact  Truddie Hidden, RN Phone Number: 08/02/2023, 2:47 PM  Clinical Narrative:    Per Consuello Closs at [facility], auth still pending. MD aware.          Expected Discharge Plan and Services                                               Social Determinants of Health (SDOH) Interventions SDOH Screenings   Food Insecurity: Patient Unable To Answer (07/13/2023)  Housing: Patient Unable To Answer (07/13/2023)  Transportation Needs: Patient Unable To Answer (07/13/2023)  Utilities: Patient Unable To Answer (07/13/2023)  Depression (PHQ2-9): Low Risk  (02/25/2019)  Financial Resource Strain: Low Risk  (06/30/2018)  Physical Activity: Inactive (06/30/2018)  Social Connections: Patient Unable To Answer (07/13/2023)  Stress: Stress Concern Present (06/30/2018)  Tobacco Use: High Risk (07/12/2023)    Readmission Risk Interventions     No data to display

## 2023-08-02 NOTE — Progress Notes (Addendum)
 Physical Therapy Treatment Patient Details Name: Jeremiah Nguyen MRN: [id number] DOB: 1955/10/02 Today's Date: 08/02/2023   History of Present Illness Patient is a 68 year old male with history of COPD, alcohol dependence, and seizure disorder who presented on 07/12/2023 from group home due to altered mental status. Positive for influenza A, required mechanical ventilation, now on HHFNC.    PT Comments  PT/OT co-treat performed this date. Pt is making limited progress with ability to tolerate transfer bed->chair this date. Still required +2 assist and has very low tolerance for exertion with quick desat to 83%. Pt recovers to mid 80% with rest. Encouraging pt to continue mobility over weekend, sitting at EOB with RN staff at least 2x/day to increase tolerance. Pt agreeable, chart on whiteboard for compliance.    If plan is discharge home, recommend the following: Two people to help with walking and/or transfers;A lot of help with bathing/dressing/bathroom;Assist for transportation;Help with stairs or ramp for entrance;Supervision due to cognitive status;Assistance with cooking/housework;Assistance with feeding;Direct supervision/assist for medications management;Direct supervision/assist for financial management   Can travel by private vehicle     No  Equipment Recommendations  Other (comment)    Recommendations for Other Services       Precautions / Restrictions Precautions Precautions: Fall Recall of Precautions/Restrictions: Impaired Precaution/Restrictions Comments: monitor Sp02 Restrictions Weight Bearing Restrictions Per Provider Order: No     Mobility  Bed Mobility Overal bed mobility: Needs Assistance Bed Mobility: Supine to Sit     Supine to sit: Supervision     General bed mobility comments: improved bed mobility today    Transfers Overall transfer level: Needs assistance Equipment used: 2 person hand held assist Transfers: Sit to/from Stand Sit to Stand: Min  assist, +2 physical assistance   Step pivot transfers: Mod assist, +2 physical assistance       General transfer comment: able to stand with HHA. Takes several steps over to recliner. O2 sats decrease to 83% with exertion. Still on 40L of HFNC.    Ambulation/Gait               General Gait Details: unable   Stairs             Wheelchair Mobility     Tilt Bed    Modified Rankin (Stroke Patients Only)       Balance Overall balance assessment: Needs assistance Sitting-balance support: Feet supported Sitting balance-Leahy Scale: Good     Standing balance support: Bilateral upper extremity supported, During functional activity Standing balance-Leahy Scale: Fair                              Hotel manager: Impaired Factors Affecting Communication: Difficulty expressing self  Cognition Arousal: Alert Behavior During Therapy: WFL for tasks assessed/performed   PT - Cognitive impairments: No apparent impairments, Memory, Sequencing                       PT - Cognition Comments: very flat affect, however agreeable to session Following commands: Impaired Following commands impaired: Follows one step commands with increased time    Cueing Cueing Techniques: Verbal cues, Tactile cues, Gestural cues  Exercises Other Exercises Other Exercises: seated ADL with OT including changing linens and gown. +2 for line management. Able to tolerate sitting at EOB for prolonged time    General Comments General comments (skin integrity, edema, etc.): Pt on 40L HFNC FiO2 45, O2 consistently  85% throughout session (with minimum seen 83% and maximum seen 89%).      Pertinent Vitals/Pain Pain Assessment Pain Assessment: No/denies pain    Home Living Family/patient expects to be discharged to:: Group home Living Arrangements: Group Home Available Help at Discharge: Available 24 hours/day               Additional  Comments: patient is a poor historian    Prior Function            PT Goals (current goals can now be found in the care plan section) Acute Rehab PT Goals Patient Stated Goal: none stated PT Goal Formulation: With patient Time For Goal Achievement: 08/14/23 Potential to Achieve Goals: Fair Progress towards PT goals: Progressing toward goals    Frequency    Min 2X/week      PT Plan      Co-evaluation PT/OT/SLP Co-Evaluation/Treatment: Yes Reason for Co-Treatment: Complexity of the patient's impairments (multi-system involvement);For patient/therapist safety PT goals addressed during session: Mobility/safety with mobility OT goals addressed during session: ADL's and self-care      AM-PAC PT "6 Clicks" Mobility   Outcome Measure  Help needed turning from your back to your side while in a flat bed without using bedrails?: A Lot Help needed moving from lying on your back to sitting on the side of a flat bed without using bedrails?: A Lot Help needed moving to and from a bed to a chair (including a wheelchair)?: A Lot Help needed standing up from a chair using your arms (e.g., wheelchair or bedside chair)?: A Lot Help needed to walk in hospital room?: Total Help needed climbing 3-5 steps with a railing? : Total 6 Click Score: 10    End of Session Equipment Utilized During Treatment: Oxygen Activity Tolerance: Patient tolerated treatment well;Patient limited by fatigue Patient left: in chair (chair alarm, but no chair alarm box noted) Nurse Communication: Mobility status PT Visit Diagnosis: Muscle weakness (generalized) (M62.81);Unsteadiness on feet (R26.81)     Time: 4782-9562 PT Time Calculation (min) (ACUTE ONLY): 23 min  Charges:    $Therapeutic Activity: 8-22 mins PT General Charges $$ ACUTE PT VISIT: 1 Visit                     Elizabeth Palau, PT, DPT, GCS [phone]    Jeremiah Nguyen 08/02/2023, 2:28 PM

## 2023-08-02 NOTE — Progress Notes (Signed)
 PULMONOLOGY         Date: 08/02/2023,   MRN# [id number] Jeremiah Nguyen 1956/02/16     AdmissionWeight: 68 kg                 CurrentWeight: 64.2 kg  Referring provider: Dr Renae Gloss   CHIEF COMPLAINT:   Acute on chronic hypoxemic respiratory failure   HISTORY OF PRESENT ILLNESS   This is a 68 yo M with hx of COPD, seizure disorder, liver disease, GERD who intially came in 06/2023 with AECOPD with influenza A infection.  He was found to be hypoxemic and altered with encephalopathy. He was treated for bacterial pneumonia and required ICU hospitalization. He developed septic shock and was intubated placed on MV and was on vasopressor support while in intensive care. He eventually improved and was weaned from MV.  At baseline he does have psychiatric disease and his home regimen was restarted.  He also developed component of metabolic encephalopathy presumably from renal impairment. Despite full scope of therapy he continues to require HFNC support and is on 50%/40L.  He does use IS but seems to be labored and at times regurgitates whittish phlegm. He had CT chest PE protocol performed 07/29/23 , I reviewed this independently and placed pictorial documentation below. PCCM consultation for further evaluation and management.    Patient seen at bedside. He is conversive and is lucid.  He is in good spirits wants to walk.  His nasal passages seem to be more obstructed then before with clotted debri.  I think we should place him in face mask Oxygen therapy to help [place] wean down his O2 requirement.   PAST MEDICAL HISTORY   Past Medical History:  Diagnosis Date   COPD (chronic obstructive pulmonary disease) (HCC)    Eating disorder    GERD (gastroesophageal reflux disease)    Liver disease    Seizures (HCC)    Sleep apnea      SURGICAL HISTORY   Past Surgical History:  Procedure Laterality Date   COLONOSCOPY WITH PROPOFOL N/A 12/05/2020   Procedure: COLONOSCOPY WITH PROPOFOL;   Surgeon: Regis Bill, MD;  Location: [facility] ENDOSCOPY;  Service: Endoscopy;  Laterality: N/A;   ESOPHAGOGASTRODUODENOSCOPY (EGD) WITH PROPOFOL N/A 02/25/2018   Procedure: ESOPHAGOGASTRODUODENOSCOPY (EGD) WITH PROPOFOL;  Surgeon: Wyline Mood, MD;  Location: [facility] ENDOSCOPY;  Service: Gastroenterology;  Laterality: N/A;   LESION EXCISION Left 12-15-13   follicular cyst   SKIN BIOPSY       FAMILY HISTORY   Family History  Problem Relation Age of Onset   Cancer Mother        breast     SOCIAL HISTORY   Social History   Tobacco Use   Smoking status: Every Day    Current packs/day: 0.50    Average packs/day: 0.5 packs/day for 30.0 years (15.0 ttl pk-yrs)    Types: Cigarettes   Smokeless tobacco: Never  Vaping Use   Vaping status: Never Used  Substance Use Topics   Alcohol use: No   Drug use: No     MEDICATIONS    Home Medication:    Current Medication:  Current Facility-Administered Medications:    acetaminophen (TYLENOL) tablet 650 mg, 650 mg, Oral, Q4H PRN, Mansy, Jan A, MD, 650 mg at 07/29/23 7829   acetylcysteine (MUCOMYST) 20 % nebulizer / oral solution 4 mL, 4 mL, Nebulization, BID, Nazar Kuan, MD, 4 mL at 08/01/23 2252   Ampicillin-Sulbactam (UNASYN) 3 g in sodium chloride 0.9 %  100 mL IVPB, 3 g, Intravenous, Q6H, Wieting, Richard, MD, Last Rate: 200 mL/hr at 08/02/23 0354, 3 g at 08/02/23 0354   benzonatate (TESSALON) capsule 100 mg, 100 mg, Oral, TID, Renae Gloss, Richard, MD, 100 mg at 08/01/23 2114   Chlorhexidine Gluconate Cloth 2 % PADS 6 each, 6 each, Topical, Daily, Pennie Banter, DO, 6 each at 07/31/23 1010   chlorproMAZINE (THORAZINE) tablet 50 mg, 50 mg, Oral, QID, Leeroy Bock, MD, 50 mg at 08/01/23 2113   feeding supplement (ENSURE ENLIVE / ENSURE PLUS) liquid 237 mL, 237 mL, Oral, TID BM, Karna Christmas, Wrangler Penning, MD, 237 mL at 08/01/23 2112   fluticasone furoate-vilanterol (BREO ELLIPTA) 100-25 MCG/ACT 1 puff, 1 puff, Inhalation, Daily,  Wieting, Richard, MD, 1 puff at 08/01/23 0830   folic acid (FOLVITE) tablet 1 mg, 1 mg, Oral, Daily, Karna Christmas, Jayel Inks, MD, 1 mg at 08/01/23 0944   guaiFENesin (ROBITUSSIN) 100 MG/5ML liquid 5 mL, 5 mL, Oral, Q4H PRN, Renae Gloss, Richard, MD   ipratropium-albuterol (DUONEB) 0.5-2.5 (3) MG/3ML nebulizer solution 3 mL, 3 mL, Nebulization, Q4H PRN, Esaw Grandchild A, DO, 3 mL at 08/01/23 2252   lamoTRIgine (LAMICTAL) tablet 150 mg, 150 mg, Oral, BID, Rust-Chester, Britton L, NP, 150 mg at 08/01/23 2113   methylPREDNISolone sodium succinate (SOLU-MEDROL) 40 mg/mL injection 40 mg, 40 mg, Intravenous, Q24H, Esaw Grandchild A, DO, 40 mg at 08/01/23 0945   metoprolol succinate (TOPROL-XL) 24 hr tablet 25 mg, 25 mg, Oral, Daily, Esaw Grandchild A, DO, 25 mg at 08/01/23 0944   multivitamin with minerals tablet 1 tablet, 1 tablet, Oral, Daily, Karna Christmas, Deiona Hooper, MD, 1 tablet at 08/01/23 0944   nicotine (NICODERM CQ - dosed in mg/24 hours) patch 14 mg, 14 mg, Transdermal, Daily, Ezequiel Essex, NP, 14 mg at 08/01/23 0943   ondansetron (ZOFRAN) injection 4 mg, 4 mg, Intravenous, Q6H PRN, Esaw Grandchild A, DO, 4 mg at 07/31/23 2159   Oral care mouth rinse, 15 mL, Mouth Rinse, PRN, Esaw Grandchild A, DO   oxymetazoline (AFRIN) 0.05 % nasal spray 4 spray, 4 spray, Each Nare, BID PRN, Renae Gloss, Richard, MD   pantoprazole (PROTONIX) EC tablet 40 mg, 40 mg, Oral, BID, Wieting, Richard, MD, 40 mg at 08/01/23 2115   polyethylene glycol (MIRALAX / GLYCOLAX) packet 17 g, 17 g, Oral, BID, Wieting, Richard, MD, 17 g at 08/01/23 2115   senna (SENOKOT) tablet 17.2 mg, 2 tablet, Oral, QHS, Wieting, Richard, MD, 17.2 mg at 08/01/23 2115   sertraline (ZOLOFT) tablet 25 mg, 25 mg, Oral, Daily, Ledonna Dormer, MD, 25 mg at 08/01/23 1610   thiamine (VITAMIN B1) tablet 100 mg, 100 mg, Oral, Daily, Rust-Chester, Britton L, NP, 100 mg at 08/01/23 0944   traZODone (DESYREL) tablet 50 mg, 50 mg, Oral, QHS PRN, Rust-Chester, Britton L, NP, 50  mg at 07/30/23 2141   umeclidinium bromide (INCRUSE ELLIPTA) 62.5 MCG/ACT 1 puff, 1 puff, Inhalation, Daily, Alford Highland, MD, 1 puff at 08/01/23 9604    ALLERGIES   Patient has no known allergies.     REVIEW OF SYSTEMS    Review of Systems:  Gen:  Denies  fever, sweats, chills weigh loss  HEENT: Denies blurred vision, double vision, ear pain, eye pain, hearing loss, nose bleeds, sore throat Cardiac:  No dizziness, chest pain or heaviness, chest tightness,edema Resp:   reports dyspnea chronically  Gi: Denies swallowing difficulty, stomach pain, nausea or vomiting, diarrhea, constipation, bowel incontinence Gu:  Denies bladder incontinence, burning urine Ext:   Denies Joint  pain, stiffness or swelling Skin: Denies  skin rash, easy bruising or bleeding or hives Endoc:  Denies polyuria, polydipsia , polyphagia or weight change Psych:   Denies depression, insomnia or hallucinations   Other:  All other systems negative   VS: BP 113/62 (BP Location: Right Arm)   Pulse 85   Temp 98.7 F (37.1 C) (Oral)   Resp 20   Ht 5' 7.99" (1.727 m)   Wt 64.2 kg   SpO2 97%   BMI 21.53 kg/m      PHYSICAL EXAM    GENERAL:NAD, no fevers, chills, no weakness no fatigue HEAD: Normocephalic, atraumatic.  EYES: Pupils equal, round, reactive to light. Extraocular muscles intact. No scleral icterus.  MOUTH: Moist mucosal membrane. Dentition intact. No abscess noted.  EAR, NOSE, THROAT: Clear without exudates. No external lesions.  NECK: Supple. No thyromegaly. No nodules. No JVD.  PULMONARY: decreased breath sounds with mild rhonchi worse at bases bilaterally.  CARDIOVASCULAR: S1 and S2. Regular rate and rhythm. No murmurs, rubs, or gallops. No edema. Pedal pulses 2+ bilaterally.  GASTROINTESTINAL: Soft, nontender, nondistended. No masses. Positive bowel sounds. No hepatosplenomegaly.  MUSCULOSKELETAL: No swelling, clubbing, or edema. Range of motion full in all extremities.   NEUROLOGIC: Cranial nerves II through XII are intact. No gross focal neurological deficits. Sensation intact. Reflexes intact.  SKIN: No ulceration, lesions, rashes, or cyanosis. Skin warm and dry. Turgor intact.  PSYCHIATRIC: Mood, affect within normal limits. The patient is awake, alert and oriented x 3. Insight, judgment intact.       IMAGING     CT CHEST WITH BILATERAL BRONCHITIC CHANGES, ANTERIORLY ON RIGHT THERE IS A LARGE BULLA AT RIGHT LOWER LOBE, POSTERIORLY THERE IS ATELECTASIS AT LEFT LOWER LOBE.    ASSESSMENT/PLAN   Acute on chronic hypoxemic respiratory failure     - noted residual bronchitic changes - agree with current solumedrol       - agree with unasyn - patient noted to regurgitate while using incentive spirometry and may have episodic aspirations      -continue COPD care path with neb treatments       -initiate mucomyst 20% - 4ml BID  COPD-chronic with exacerbation post influenza -continue solumedrol 40mg  -BNP is normal unlikely overlap of CHF -continue current antimicrobials   Atelectasis   - worse at LLL  Starting metaneb with albuterol q8h x 3 d   Tobacco dependence   Continue nicotine replacement   OSA - CPAP at bedtime with setting by RT           Thank you for allowing me to participate in the care of this patient.   Patient/Family are satisfied with care plan and all questions have been answered.    Provider disclosure: Patient with at least one acute or chronic illness or injury that poses a threat to life or bodily function and is being managed actively during this encounter.  All of the below services have been performed independently by signing provider:  review of prior documentation from internal and or external health records.  Review of previous and current lab results.  Interview and comprehensive assessment during patient visit today. Review of current and previous chest radiographs/CT scans. Discussion of management and  test interpretation with health care team and patient/family.   This document was prepared using Dragon voice recognition software and may include unintentional dictation errors.     Vida Rigger, M.D.  Division of Pulmonary & Critical Care Medicine

## 2023-08-02 NOTE — Evaluation (Signed)
 Occupational Therapy Re-Evaluation Patient Details Name: Jeremiah Nguyen MRN: [id number] DOB: [birth year] Today's Date: 08/02/2023   History of Present Illness   Patient is a 68 year old male with history of COPD, alcohol dependence, and seizure disorder who presented on 07/12/2023 from group home due to altered mental status. Positive for influenza A, required mechanical ventilation, now on HHFNC.     Clinical Impressions Pt is making progress towards goals with improved activity tolerance; continues to require x2 for safety during transfers and significant assist with ADLs; on 40L O2 via HFNC today. Continue OT while in acute care.     If plan is discharge home, recommend the following:   Help with stairs or ramp for entrance;Supervision due to cognitive status;A lot of help with bathing/dressing/bathroom;Two people to help with walking and/or transfers     Functional Status Assessment   Patient has had a recent decline in their functional status and demonstrates the ability to make significant improvements in function in a reasonable and predictable amount of time.     Equipment Recommendations   Other (comment) (defer to next venue of care)     Recommendations for Other Services         Precautions/Restrictions   Precautions Precautions: Fall Recall of Precautions/Restrictions: Impaired Precaution/Restrictions Comments: monitor Sp02 Restrictions Weight Bearing Restrictions Per Provider Order: No     Mobility Bed Mobility Overal bed mobility: Needs Assistance Bed Mobility: Supine to Sit     Supine to sit: Supervision     General bed mobility comments: improved bed mobility today    Transfers Overall transfer level: Needs assistance Equipment used: 2 person hand held assist Transfers: Sit to/from Stand Sit to Stand: Min assist, +2 physical assistance           General transfer comment: slow, but steady with x2 assist      Balance Overall  balance assessment: Needs assistance Sitting-balance support: Feet supported Sitting balance-Leahy Scale: Good     Standing balance support: Bilateral upper extremity supported, During functional activity Standing balance-Leahy Scale: Fair Standing balance comment: external support required                           ADL either performed or assessed with clinical judgement   ADL Overall ADL's : Needs assistance/impaired                                       General ADL Comments: Pt requiring MAX A for donning BIL socks; while seated EOB, pt able to reach down to floor to pick up socks, however while seated in modified figure four position pt unable to lean forward with BIL hands to don sock; OT assisting with one hand and ultimately requiring two hands to assist. Transferred with HHA MIN A x2 to recliner next to bed.     Vision         Perception         Praxis         Pertinent Vitals/Pain Pain Assessment Pain Assessment: No/denies pain     Extremity/Trunk Assessment Upper Extremity Assessment Upper Extremity Assessment: Generalized weakness   Lower Extremity Assessment Lower Extremity Assessment: Generalized weakness   Cervical / Trunk Assessment Cervical / Trunk Assessment: Normal   Communication Communication Communication: Impaired Factors Affecting Communication: Difficulty expressing self   Cognition Arousal: Alert Behavior During Therapy:  WFL for tasks assessed/performed Cognition: Cognition impaired, History of cognitive impairments             OT - Cognition Comments: Pt follows one step commands consistently. Does need encouragement to try donning socks, as stating "I can't do it today".                 Following commands: Impaired Following commands impaired: Follows one step commands with increased time     Cueing  General Comments   Cueing Techniques: Verbal cues;Tactile cues;Gestural cues  Pt on 40L  HFNC FiO2 45, O2 consistently 85% throughout session (with minimum seen 83% and maximum seen 89%).   Exercises     Shoulder Instructions      Home Living Family/patient expects to be discharged to:: Group home Living Arrangements: Group Home Available Help at Discharge: Available 24 hours/day                             Additional Comments: patient is a poor historian      Prior Functioning/Environment Prior Level of Function : Independent/Modified Independent;Patient poor historian/Family not available             Mobility Comments: patient reports he is independent without assistance and no DME. no family in room to confirm ADLs Comments: states he is (I); no family to confirm.    OT Problem List: Decreased strength;Decreased range of motion;Decreased activity tolerance;Impaired balance (sitting and/or standing);Decreased safety awareness;Decreased cognition   OT Treatment/Interventions: Self-care/ADL training;Therapeutic exercise;Energy conservation;DME and/or AE instruction;Therapeutic activities;Patient/family education      OT Goals(Current goals can be found in the care plan section)   Acute Rehab OT Goals Patient Stated Goal: get better OT Goal Formulation: With patient Time For Goal Achievement: 08/16/23 Potential to Achieve Goals: Fair   OT Frequency:  Min 2X/week    Co-evaluation PT/OT/SLP Co-Evaluation/Treatment: Yes Reason for Co-Treatment: Complexity of the patient's impairments (multi-system involvement);For patient/therapist safety   OT goals addressed during session: ADL's and self-care      AM-PAC OT "6 Clicks" Daily Activity     Outcome Measure Help from another person eating meals?: A Little Help from another person taking care of personal grooming?: A Little Help from another person toileting, which includes using toliet, bedpan, or urinal?: A Lot Help from another person bathing (including washing, rinsing, drying)?: A  Lot Help from another person to put on and taking off regular upper body clothing?: A Little Help from another person to put on and taking off regular lower body clothing?: A Lot 6 Click Score: 15   End of Session Equipment Utilized During Treatment: Oxygen Nurse Communication: Mobility status  Activity Tolerance: Patient tolerated treatment well Patient left: in chair;with call bell/phone within reach;Other (comment) (no chair alarm box in room)  OT Visit Diagnosis: Other abnormalities of gait and mobility (R26.89);Muscle weakness (generalized) (M62.81)                Time: 4098-1191 OT Time Calculation (min): 24 min Charges:  OT General Charges $OT Visit: 1 Visit OT Evaluation $OT Re-eval: 1 Re-eval  Linward Foster, MS, OTR/L  Alvester Morin 08/02/2023, 12:12 PM

## 2023-08-02 NOTE — Plan of Care (Signed)
  Problem: Role Relationship: Goal: Method of communication will improve Outcome: Progressing   Problem: Clinical Measurements: Goal: Cardiovascular complication will be avoided Outcome: Progressing   Problem: Pain Managment: Goal: General experience of comfort will improve and/or be controlled Outcome: Progressing   Problem: Safety: Goal: Ability to remain free from injury will improve Outcome: Progressing

## 2023-08-02 NOTE — Progress Notes (Signed)
 Progress Note   Patient: GLENDA KUNST QIO:[id number] DOB: 09/06/1955 DOA: 07/12/2023     21 DOS: the patient was seen and examined on 08/02/2023   Brief hospital course: 68 year old male with a past medical history significant for COPD, alcohol dependence, and seizure disorder who presented to [facility] ED on 07/12/2023 from his group home due to altered mental status.  Per report he has been altered over the last 24 hours.   On arrival to the ED he remained somnolent and altered, oriented only to self.  Given his respiratory distress he was placed on BiPAP and eventually required intubation and levophed.  Positive for influenza A. Started on tamiflu.    07/15/23- patient passed SBT today.  He is liberated from MV on to [facility]. Remains with encephalopathy.  07/16/23- patient is improved overnight. He is on home psychiatric medications now.  3/8 -- weaned to 10 L/min HFNC O2 3/9 -- O2 sats decompensated early AM, transferred to stepdown, back on heated HF o2 at 70-80%FiO2 - resume IV steroids.  Negative RVP, normal BMP, normal LA, CXR neg 3/10 -- remains on heated HF O2 50 L/min 65% FiO2.  Negative CTA chest for PE, but showed new RLL infiltrate, possible and started on Unasyn 3/11 -- 40 L/min 60-65% fiO2 3/12.  Patient on heated high flow nasal cannula 49% oxygen this morning with 40 L flow.  Pulmonary reconsulted. 3/13.  Patient still on 47% oxygen and 40 L flow on heated high flow nasal cannula.  Patient had a nosebleed this morning requiring me to stop aspirin and Lovenox injections and give Afrin nasal spray. 3/14.  No further nosebleed.  Awaiting insurance authorization for LTAC.  Still on heated high flow nasal cannula around 51% oxygen and 40 L flow as of this morning.   Assessment and Plan: * Acute respiratory failure with hypoxia and hypercapnia (HCC) Patient on heated high flow nasal cannula 51% oxygen this morning with 40 L flow.  Appreciate pulmonary consultation.  Patient intubated  during the hospital course.  Added inhalers on 3/12.  Patient on Solu-Medrol and Unasyn.  Incentive spirometry and nebulizer treatments.  Awaiting insurance authorization for LTAC.  AKI (acute kidney injury) (HCC) Resolved.  Creatinine 2.0 on admission and creatinine 0.86 on last check.  Acute metabolic encephalopathy Secondary to septic shock  Influenza A with pneumonia On admission  Malnutrition of moderate degree Continue supplements  Gastroesophageal reflux disease Increase PPI to twice daily patient has dilated fluid-filled esophagus which can increase risk of of aspiration.  Patient does not complain of any nausea or vomiting.  Wondering if patient has achalasia.  Requiring too much oxygen for any endoscopy procedure at this point.  Patient's sister told me that he has had hiccups in the past and used to vomit in order to make himself stop hiccuping.  Seizures (HCC) Continue Lamictal  Septic shock (HCC) Present on admission and now has resolved.        Subjective: Patient feels okay.  Encouraged to sit up with meals and sit up in the chair.  Encouraged to use incentive spirometer.  Case discussed with nursing staff and respiratory therapist.  Admitted 21 days ago with acute hypoxic respiratory failure requiring intubation.  Physical Exam: Vitals:   08/02/23 0355 08/02/23 0500 08/02/23 0908 08/02/23 1253  BP: 113/62  (!) 117/59 126/65  Pulse: 85  91 99  Resp:      Temp: 98.7 F (37.1 C)  98.7 F (37.1 C) 99.5 F (37.5 C)  TempSrc: Oral     SpO2: 97%  (!) 88% 93%  Weight:  64.2 kg    Height:       Physical Exam HENT:     Head: Normocephalic.     Mouth/Throat:     Pharynx: No oropharyngeal exudate.  Eyes:     General: Lids are normal.     Conjunctiva/sclera: Conjunctivae normal.  Cardiovascular:     Rate and Rhythm: Normal rate and regular rhythm.     Heart sounds: Normal heart sounds, S1 normal and S2 normal.  Pulmonary:     Breath sounds: Examination of  the right-lower field reveals decreased breath sounds. Examination of the left-lower field reveals decreased breath sounds. Decreased breath sounds present. No wheezing, rhonchi or rales.  Abdominal:     Palpations: Abdomen is soft.     Tenderness: There is no abdominal tenderness.  Musculoskeletal:     Right lower leg: No swelling.     Left lower leg: No swelling.  Skin:    General: Skin is warm.     Findings: No rash.  Neurological:     Mental Status: He is alert.     Data Reviewed: CRP 7 point  Family Communication: Spoke with family at the bedside yesterday  Disposition: Status is: Inpatient Remains inpatient appropriate because: Awaiting insurance authorization for LTAC  Planned Discharge Destination: LTAC    Time spent: 28 minutes  Author: Alford Highland, MD 08/02/2023 1:03 PM  For on call review [email or web address].

## 2023-08-03 DIAGNOSIS — N179 Acute kidney failure, unspecified: Secondary | ICD-10-CM | POA: Diagnosis not present

## 2023-08-03 DIAGNOSIS — G9341 Metabolic encephalopathy: Secondary | ICD-10-CM | POA: Diagnosis not present

## 2023-08-03 DIAGNOSIS — J9601 Acute respiratory failure with hypoxia: Secondary | ICD-10-CM | POA: Diagnosis not present

## 2023-08-03 DIAGNOSIS — E440 Moderate protein-calorie malnutrition: Secondary | ICD-10-CM | POA: Diagnosis not present

## 2023-08-03 NOTE — Progress Notes (Signed)
 PULMONOLOGY         Date: 08/03/2023,   MRN# [id number] Jeremiah Nguyen 1956/01/07     AdmissionWeight: 68 kg                 CurrentWeight: 61 kg  Referring provider: Dr Renae Gloss   CHIEF COMPLAINT:   Acute on chronic hypoxemic respiratory failure   HISTORY OF PRESENT ILLNESS   This is a 68 yo M with hx of COPD, seizure disorder, liver disease, GERD who intially came in 06/2023 with AECOPD with influenza A infection.  He was found to be hypoxemic and altered with encephalopathy. He was treated for bacterial pneumonia and required ICU hospitalization. He developed septic shock and was intubated placed on MV and was on vasopressor support while in intensive care. He eventually improved and was weaned from MV.  At baseline he does have psychiatric disease and his home regimen was restarted.  He also developed component of metabolic encephalopathy presumably from renal impairment. Despite full scope of therapy he continues to require HFNC support and is on 50%/40L.  He does use IS but seems to be labored and at times regurgitates whittish phlegm. He had CT chest PE protocol performed 07/29/23 , I reviewed this independently and placed pictorial documentation below. PCCM consultation for further evaluation and management.    Patient seen at bedside. He is conversive and is lucid.  He is in good spirits wants to walk.  His nasal passages seem to be more obstructed then before with clotted debri.  I think we should place him in face mask Oxygen therapy to help [place] wean down his O2 requirement.    08/03/23- patient seen at bedside, he had large diarreah during my eval.  He was weaned down to 38%/40.  Reviewed with RT regarding weaning to venti mask or NRB with goal SPO2 >90%.  He seems improved clinically and is able to participate with metaneb and mucomyst  PAST MEDICAL HISTORY   Past Medical History:  Diagnosis Date   COPD (chronic obstructive pulmonary disease) (HCC)    Eating  disorder    GERD (gastroesophageal reflux disease)    Liver disease    Seizures (HCC)    Sleep apnea      SURGICAL HISTORY   Past Surgical History:  Procedure Laterality Date   COLONOSCOPY WITH PROPOFOL N/A 12/05/2020   Procedure: COLONOSCOPY WITH PROPOFOL;  Surgeon: Regis Bill, MD;  Location: [facility] ENDOSCOPY;  Service: Endoscopy;  Laterality: N/A;   ESOPHAGOGASTRODUODENOSCOPY (EGD) WITH PROPOFOL N/A 02/25/2018   Procedure: ESOPHAGOGASTRODUODENOSCOPY (EGD) WITH PROPOFOL;  Surgeon: Wyline Mood, MD;  Location: [facility] ENDOSCOPY;  Service: Gastroenterology;  Laterality: N/A;   LESION EXCISION Left 12-15-13   follicular cyst   SKIN BIOPSY       FAMILY HISTORY   Family History  Problem Relation Age of Onset   Cancer Mother        breast     SOCIAL HISTORY   Social History   Tobacco Use   Smoking status: Every Day    Current packs/day: 0.50    Average packs/day: 0.5 packs/day for 30.0 years (15.0 ttl pk-yrs)    Types: Cigarettes   Smokeless tobacco: Never  Vaping Use   Vaping status: Never Used  Substance Use Topics   Alcohol use: No   Drug use: No     MEDICATIONS    Home Medication:    Current Medication:  Current Facility-Administered Medications:    acetaminophen (TYLENOL) tablet  650 mg, 650 mg, Oral, Q4H PRN, Mansy, Jan A, MD, 650 mg at 07/29/23 9604   acetylcysteine (MUCOMYST) 20 % nebulizer / oral solution 4 mL, 4 mL, Nebulization, BID, Amirr Achord, MD, 4 mL at 08/03/23 0835   Ampicillin-Sulbactam (UNASYN) 3 g in sodium chloride 0.9 % 100 mL IVPB, 3 g, Intravenous, Q6H, Wieting, Richard, MD, Last Rate: 200 mL/hr at 08/03/23 1013, 3 g at 08/03/23 1013   benzonatate (TESSALON) capsule 100 mg, 100 mg, Oral, TID, Renae Gloss, Richard, MD, 100 mg at 08/03/23 5409   Chlorhexidine Gluconate Cloth 2 % PADS 6 each, 6 each, Topical, Daily, Pennie Banter, DO, 6 each at 08/03/23 1009   chlorproMAZINE (THORAZINE) tablet 50 mg, 50 mg, Oral, QID, Jamelle Rushing  L, MD, 50 mg at 08/03/23 0958   feeding supplement (ENSURE ENLIVE / ENSURE PLUS) liquid 237 mL, 237 mL, Oral, TID BM, Quida Glasser, MD, 237 mL at 08/03/23 0956   fluticasone furoate-vilanterol (BREO ELLIPTA) 100-25 MCG/ACT 1 puff, 1 puff, Inhalation, Daily, Wieting, Richard, MD, 1 puff at 08/03/23 1009   folic acid (FOLVITE) tablet 1 mg, 1 mg, Oral, Daily, Karna Christmas, Marelyn Rouser, MD, 1 mg at 08/03/23 0959   guaiFENesin (ROBITUSSIN) 100 MG/5ML liquid 5 mL, 5 mL, Oral, Q4H PRN, Renae Gloss, Richard, MD   ipratropium-albuterol (DUONEB) 0.5-2.5 (3) MG/3ML nebulizer solution 3 mL, 3 mL, Nebulization, Q4H PRN, Esaw Grandchild A, DO, 3 mL at 08/03/23 0835   lamoTRIgine (LAMICTAL) tablet 150 mg, 150 mg, Oral, BID, Rust-Chester, Britton L, NP, 150 mg at 08/03/23 0958   methylPREDNISolone sodium succinate (SOLU-MEDROL) 40 mg/mL injection 40 mg, 40 mg, Intravenous, Q24H, Esaw Grandchild A, DO, 40 mg at 08/03/23 8119   metoprolol succinate (TOPROL-XL) 24 hr tablet 25 mg, 25 mg, Oral, Daily, Esaw Grandchild A, DO, 25 mg at 08/03/23 1478   multivitamin with minerals tablet 1 tablet, 1 tablet, Oral, Daily, Karna Christmas, Charisa Twitty, MD, 1 tablet at 08/03/23 2956   nicotine (NICODERM CQ - dosed in mg/24 hours) patch 14 mg, 14 mg, Transdermal, Daily, Ezequiel Essex, NP, 14 mg at 08/03/23 1003   ondansetron (ZOFRAN) injection 4 mg, 4 mg, Intravenous, Q6H PRN, Esaw Grandchild A, DO, 4 mg at 07/31/23 2159   Oral care mouth rinse, 15 mL, Mouth Rinse, PRN, Esaw Grandchild A, DO   oxymetazoline (AFRIN) 0.05 % nasal spray 4 spray, 4 spray, Each Nare, BID PRN, Renae Gloss, Richard, MD   pantoprazole (PROTONIX) EC tablet 40 mg, 40 mg, Oral, BID, Renae Gloss, Richard, MD, 40 mg at 08/03/23 0959   polyethylene glycol (MIRALAX / GLYCOLAX) packet 17 g, 17 g, Oral, BID, Wieting, Richard, MD, 17 g at 08/03/23 0957   senna (SENOKOT) tablet 17.2 mg, 2 tablet, Oral, QHS, Wieting, Richard, MD, 17.2 mg at 08/02/23 2130   sertraline (ZOLOFT) tablet 25 mg, 25 mg,  Oral, Daily, Jaskirat Zertuche, MD, 25 mg at 08/03/23 2130   thiamine (VITAMIN B1) tablet 100 mg, 100 mg, Oral, Daily, Rust-Chester, Britton L, NP, 100 mg at 08/03/23 0958   traZODone (DESYREL) tablet 50 mg, 50 mg, Oral, QHS PRN, Rust-Chester, Britton L, NP, 50 mg at 07/30/23 2141   umeclidinium bromide (INCRUSE ELLIPTA) 62.5 MCG/ACT 1 puff, 1 puff, Inhalation, Daily, Renae Gloss, Richard, MD, 1 puff at 08/03/23 1008    ALLERGIES   Patient has no known allergies.     REVIEW OF SYSTEMS    Review of Systems:  Gen:  Denies  fever, sweats, chills weigh loss  HEENT: Denies blurred vision, double vision, ear  pain, eye pain, hearing loss, nose bleeds, sore throat Cardiac:  No dizziness, chest pain or heaviness, chest tightness,edema Resp:   reports dyspnea chronically  Gi: Denies swallowing difficulty, stomach pain, nausea or vomiting, diarrhea, constipation, bowel incontinence Gu:  Denies bladder incontinence, burning urine Ext:   Denies Joint pain, stiffness or swelling Skin: Denies  skin rash, easy bruising or bleeding or hives Endoc:  Denies polyuria, polydipsia , polyphagia or weight change Psych:   Denies depression, insomnia or hallucinations   Other:  All other systems negative   VS: BP 123/68 (BP Location: Right Arm)   Pulse (!) 101   Temp 98.1 F (36.7 C)   Resp 18   Ht 5' 7.99" (1.727 m)   Wt 61 kg   SpO2 91%   BMI 20.45 kg/m      PHYSICAL EXAM    GENERAL:NAD, no fevers, chills, no weakness no fatigue HEAD: Normocephalic, atraumatic.  EYES: Pupils equal, round, reactive to light. Extraocular muscles intact. No scleral icterus.  MOUTH: Moist mucosal membrane. Dentition intact. No abscess noted.  EAR, NOSE, THROAT: Clear without exudates. No external lesions.  NECK: Supple. No thyromegaly. No nodules. No JVD.  PULMONARY: decreased breath sounds with mild rhonchi worse at bases bilaterally.  CARDIOVASCULAR: S1 and S2. Regular rate and rhythm. No murmurs, rubs, or  gallops. No edema. Pedal pulses 2+ bilaterally.  GASTROINTESTINAL: Soft, nontender, nondistended. No masses. Positive bowel sounds. No hepatosplenomegaly.  MUSCULOSKELETAL: No swelling, clubbing, or edema. Range of motion full in all extremities.  NEUROLOGIC: Cranial nerves II through XII are intact. No gross focal neurological deficits. Sensation intact. Reflexes intact.  SKIN: No ulceration, lesions, rashes, or cyanosis. Skin warm and dry. Turgor intact.  PSYCHIATRIC: Mood, affect within normal limits. The patient is awake, alert and oriented x 3. Insight, judgment intact.       IMAGING     CT CHEST WITH BILATERAL BRONCHITIC CHANGES, ANTERIORLY ON RIGHT THERE IS A LARGE BULLA AT RIGHT LOWER LOBE, POSTERIORLY THERE IS ATELECTASIS AT LEFT LOWER LOBE.    ASSESSMENT/PLAN   Acute on chronic hypoxemic respiratory failure     - noted residual bronchitic changes - agree with current solumedrol       - agree with unasyn - patient noted to regurgitate while using incentive spirometry and may have episodic aspirations      -continue COPD care path with neb treatments       -initiate mucomyst 20% - 4ml BID  COPD-chronic with exacerbation post influenza -continue solumedrol 40mg  -BNP is normal unlikely overlap of CHF -continue current antimicrobials   Atelectasis   - worse at LLL  Starting metaneb with albuterol q8h x 3 d   Tobacco dependence   Continue nicotine replacement   OSA - CPAP at bedtime with setting by RT           Thank you for allowing me to participate in the care of this patient.   Patient/Family are satisfied with care plan and all questions have been answered.    Provider disclosure: Patient with at least one acute or chronic illness or injury that poses a threat to life or bodily function and is being managed actively during this encounter.  All of the below services have been performed independently by signing provider:  review of prior documentation  from internal and or external health records.  Review of previous and current lab results.  Interview and comprehensive assessment during patient visit today. Review of current and previous chest radiographs/CT  scans. Discussion of management and test interpretation with health care team and patient/family.   This document was prepared using Dragon voice recognition software and may include unintentional dictation errors.     Vida Rigger, M.D.  Division of Pulmonary & Critical Care Medicine

## 2023-08-03 NOTE — TOC Progression Note (Signed)
 Transition of Care [facility]) - Progression Note    Patient Details  Name: Jeremiah Nguyen MRN: [id number] Date of Birth: 11-29-1955  [facility] of [facility]) CM/SW Contact  [name], LCSW Phone Number: 08/03/2023, 10:00 AM  Clinical Narrative:    Update received from Select Representative Ciera - Berkley Harvey is pending and she does not expect to have an update over the weekend.        Expected Discharge Plan and Services                                               Social Determinants of Health (SDOH) Interventions SDOH Screenings   Food Insecurity: Patient Unable To Answer (07/13/2023)  Housing: Patient Unable To Answer (07/13/2023)  Transportation Needs: Patient Unable To Answer (07/13/2023)  Utilities: Patient Unable To Answer (07/13/2023)  Depression (PHQ2-9): Low Risk  (02/25/2019)  Financial Resource Strain: Low Risk  (06/30/2018)  Physical Activity: Inactive (06/30/2018)  Social Connections: Patient Unable To Answer (07/13/2023)  Stress: Stress Concern Present (06/30/2018)  Tobacco Use: High Risk (07/12/2023)    Readmission Risk Interventions     No data to display

## 2023-08-03 NOTE — Plan of Care (Signed)
  Problem: Activity: Goal: Ability to tolerate increased activity will improve Outcome: Progressing   Problem: Respiratory: Goal: Ability to maintain a clear airway and adequate ventilation will improve Outcome: Progressing   Problem: Role Relationship: Goal: Method of communication will improve Outcome: Progressing   Problem: Education: Goal: Knowledge of General Education information will improve Description: Including pain rating scale, medication(s)/side effects and non-pharmacologic comfort measures Outcome: Progressing   Problem: Health Behavior/Discharge Planning: Goal: Ability to manage health-related needs will improve Outcome: Progressing   Problem: Clinical Measurements: Goal: Ability to maintain clinical measurements within normal limits will improve Outcome: Progressing Goal: Will remain free from infection Outcome: Progressing Goal: Diagnostic test results will improve Outcome: Progressing Goal: Respiratory complications will improve Outcome: Progressing Goal: Cardiovascular complication will be avoided Outcome: Progressing   Problem: Activity: Goal: Risk for activity intolerance will decrease Outcome: Progressing   Problem: Nutrition: Goal: Adequate nutrition will be maintained Outcome: Progressing   Problem: Coping: Goal: Level of anxiety will decrease Outcome: Progressing   Problem: Elimination: Goal: Will not experience complications related to bowel motility Outcome: Progressing Goal: Will not experience complications related to urinary retention Outcome: Progressing   Problem: Pain Managment: Goal: General experience of comfort will improve and/or be controlled Outcome: Progressing   Problem: Safety: Goal: Ability to remain free from injury will improve Outcome: Progressing   Problem: Skin Integrity: Goal: Risk for impaired skin integrity will decrease Outcome: Progressing   Problem: Education: Goal: Ability to describe self-care  measures that may prevent or decrease complications (Diabetes Survival Skills Education) will improve Outcome: Progressing Goal: Individualized Educational Video(s) Outcome: Progressing   Problem: Coping: Goal: Ability to adjust to condition or change in health will improve Outcome: Progressing   Problem: Fluid Volume: Goal: Ability to maintain a balanced intake and output will improve Outcome: Progressing   Problem: Health Behavior/Discharge Planning: Goal: Ability to identify and utilize available resources and services will improve Outcome: Progressing Goal: Ability to manage health-related needs will improve Outcome: Progressing   Problem: Metabolic: Goal: Ability to maintain appropriate glucose levels will improve Outcome: Progressing   Problem: Nutritional: Goal: Maintenance of adequate nutrition will improve Outcome: Progressing Goal: Progress toward achieving an optimal weight will improve Outcome: Progressing   Problem: Skin Integrity: Goal: Risk for impaired skin integrity will decrease Outcome: Progressing   Problem: Tissue Perfusion: Goal: Adequacy of tissue perfusion will improve Outcome: Progressing

## 2023-08-03 NOTE — Progress Notes (Signed)
 Progress Note   [name] ZOX:[id number] DOB: 04/25/1956 DOA: 07/12/2023     22 DOS: the patient was seen and examined on 08/03/2023   Brief hospital course: 68 year old male with a past medical history significant for COPD, alcohol dependence, and seizure disorder who presented to [facility] ED on 07/12/2023 from his group home due to altered mental status.  Per report he has been altered over the last 24 hours.   On arrival to the ED he remained somnolent and altered, oriented only to self.  Given his respiratory distress he was placed on BiPAP and eventually required intubation and levophed.  Positive for influenza A. Started on tamiflu.    07/15/23- patient passed SBT today.  He is liberated from MV on to [facility]. Remains with encephalopathy.  07/16/23- patient is improved overnight. He is on home psychiatric medications now.  3/8 -- weaned to 10 L/min HFNC O2 3/9 -- O2 sats decompensated early AM, transferred to stepdown, back on heated HF o2 at 70-80%FiO2 - resume IV steroids.  Negative RVP, normal BMP, normal LA, CXR neg 3/10 -- remains on heated HF O2 50 L/min 65% FiO2.  Negative CTA chest for PE, but showed new RLL infiltrate, possible and started on Unasyn 3/11 -- 40 L/min 60-65% fiO2 3/12.  Patient on heated high flow nasal cannula 49% oxygen this morning with 40 L flow.  Pulmonary reconsulted. 3/13.  Patient still on 47% oxygen and 40 L flow on heated high flow nasal cannula.  Patient had a nosebleed this morning requiring me to stop aspirin and Lovenox injections and give Afrin nasal spray. 3/14.  No further nosebleed.  Awaiting insurance authorization for LTAC.  Still on heated high flow nasal cannula around 51% oxygen and 40 L flow as of this morning. 3/15.  Patient down to 34% oxygen this am heated high flow nasal cannula when I saw him this morning on 40 L flow.   Assessment and Plan: * Acute respiratory failure with hypoxia and hypercapnia (HCC) Patient on heated high  flow nasal cannula 51% oxygen this morning with 40 L flow.  Appreciate pulmonary consultation.  Patient intubated during the hospital course.  Added inhalers on 3/12.  Patient on Solu-Medrol and Unasyn.  Incentive spirometry and nebulizer treatments.  Awaiting insurance authorization for LTAC.  AKI (acute kidney injury) (HCC) Resolved.  Creatinine 2.0 on admission and creatinine 0.86 on last check.  Acute metabolic encephalopathy Secondary to septic shock  Influenza A with pneumonia On admission  Malnutrition of moderate degree Continue supplements  Gastroesophageal reflux disease Increase PPI to twice daily patient has dilated fluid-filled esophagus which can increase risk of of aspiration.  Patient does not complain of any nausea or vomiting.  Wondering if patient has achalasia.  Requiring too much oxygen for any endoscopy procedure at this point.  Patient's sister told me that he has had hiccups in the past and used to vomit in order to make himself stop hiccuping.  Seizures (HCC) Continue Lamictal  Septic shock (HCC) Present on admission and now has resolved.        Subjective: Patient feels okay.  Offers no complaints.  This morning down on heated high flow nasal cannula 34% oxygen 40 L flow.  Admitted 22 days ago with acute respiratory failure.  Physical Exam: Vitals:   08/03/23 0500 08/03/23 0817 08/03/23 0835 08/03/23 1149  BP:  (!) 110/53  123/68  Pulse:  94  (!) 101  Resp:  18  18  Temp:  97.7 F (36.5 C)  98.1 F (36.7 C)  TempSrc:      SpO2:  91% 92% 91%  Weight: 61 kg     Height:       Physical Exam HENT:     Head: Normocephalic.     Mouth/Throat:     Pharynx: No oropharyngeal exudate.  Eyes:     General: Lids are normal.     Conjunctiva/sclera: Conjunctivae normal.  Cardiovascular:     Rate and Rhythm: Normal rate and regular rhythm.     Heart sounds: Normal heart sounds, S1 normal and S2 normal.  Pulmonary:     Breath sounds: Examination of the  right-middle field reveals decreased breath sounds. Examination of the right-lower field reveals decreased breath sounds and rhonchi. Examination of the left-lower field reveals decreased breath sounds. Decreased breath sounds and rhonchi present. No wheezing or rales.  Abdominal:     Palpations: Abdomen is soft.     Tenderness: There is no abdominal tenderness.  Musculoskeletal:     Right lower leg: No swelling.     Left lower leg: No swelling.  Skin:    General: Skin is warm.     Findings: No rash.  Neurological:     Mental Status: He is alert.     Data Reviewed: No new data today  Family Communication: Updated sister [name] on the phone  Disposition: Status is: Inpatient Remains inpatient appropriate because: Awaiting insurance authorization for LTAC.  Patient still on heated high flow nasal cannula.  Planned Discharge Destination: LTAC    Time spent: 28 minutes  Author: Alford Highland, MD 08/03/2023 12:30 PM  For on call review [email or web address].

## 2023-08-04 DIAGNOSIS — J9601 Acute respiratory failure with hypoxia: Secondary | ICD-10-CM | POA: Diagnosis not present

## 2023-08-04 DIAGNOSIS — G9341 Metabolic encephalopathy: Secondary | ICD-10-CM | POA: Diagnosis not present

## 2023-08-04 DIAGNOSIS — J09X1 Influenza due to identified novel influenza A virus with pneumonia: Secondary | ICD-10-CM | POA: Diagnosis not present

## 2023-08-04 DIAGNOSIS — R040 Epistaxis: Secondary | ICD-10-CM | POA: Clinically undetermined

## 2023-08-04 DIAGNOSIS — N179 Acute kidney failure, unspecified: Secondary | ICD-10-CM | POA: Diagnosis not present

## 2023-08-04 HISTORY — DX: Epistaxis: R04.0

## 2023-08-04 LAB — CBC
HCT: 35 % — ABNORMAL LOW (ref 39.0–52.0)
Hemoglobin: 11.9 g/dL — ABNORMAL LOW (ref 13.0–17.0)
MCH: 30.1 pg (ref 26.0–34.0)
MCHC: 34 g/dL (ref 30.0–36.0)
MCV: 88.6 fL (ref 80.0–100.0)
Platelets: 244 10*3/uL (ref 150–400)
RBC: 3.95 MIL/uL — ABNORMAL LOW (ref 4.22–5.81)
RDW: 14.4 % (ref 11.5–15.5)
WBC: 11.8 10*3/uL — ABNORMAL HIGH (ref 4.0–10.5)
nRBC: 0 % (ref 0.0–0.2)

## 2023-08-04 LAB — BASIC METABOLIC PANEL
Anion gap: 7 (ref 5–15)
BUN: 18 mg/dL (ref 8–23)
CO2: 34 mmol/L — ABNORMAL HIGH (ref 22–32)
Calcium: 8.9 mg/dL (ref 8.9–10.3)
Chloride: 98 mmol/L (ref 98–111)
Creatinine, Ser: 0.97 mg/dL (ref 0.61–1.24)
GFR, Estimated: >60 mL/min (ref >60)
Glucose, Bld: 96 mg/dL (ref 70–99)
Potassium: 3.9 mmol/L (ref 3.5–5.1)
Sodium: 139 mmol/L (ref 135–145)

## 2023-08-04 NOTE — Progress Notes (Signed)
 PULMONOLOGY         Date: 08/04/2023,   MRN# [id number] Jeremiah Nguyen 1955/07/21     AdmissionWeight: 68 kg                 CurrentWeight: 63.2 kg  Referring provider: Dr Renae Gloss   CHIEF COMPLAINT:   Acute on chronic hypoxemic respiratory failure   HISTORY OF PRESENT ILLNESS   This is a 68 yo M with hx of COPD, seizure disorder, liver disease, GERD who intially came in 06/2023 with AECOPD with influenza A infection.  He was found to be hypoxemic and altered with encephalopathy. He was treated for bacterial pneumonia and required ICU hospitalization. He developed septic shock and was intubated placed on MV and was on vasopressor support while in intensive care. He eventually improved and was weaned from MV.  At baseline he does have psychiatric disease and his home regimen was restarted.  He also developed component of metabolic encephalopathy presumably from renal impairment. Despite full scope of therapy he continues to require HFNC support and is on 50%/40L.  He does use IS but seems to be labored and at times regurgitates whittish phlegm. He had CT chest PE protocol performed 07/29/23 , I reviewed this independently and placed pictorial documentation below. PCCM consultation for further evaluation and management.    Patient seen at bedside. He is conversive and is lucid.  He is in good spirits wants to walk.  His nasal passages seem to be more obstructed then before with clotted debri.  I think we should place him in face mask Oxygen therapy to help [place] wean down his O2 requirement.    08/03/23- patient seen at bedside, he had large diarreah during my eval.  He was weaned down to 38%/40.  Reviewed with RT regarding weaning to venti mask or NRB with goal SPO2 >90%.  He seems improved clinically and is able to participate with metaneb and mucomyst  08/04/23- patient still on HFNC now on 45/45 but resting with spO2 >95% during examination.  He's receiving maximal medical therapy  with steroids, antibiotics, metaneb recruitment and mucomyst, despite this has had poor improvement.  Overall has not done well in the past year and has not walked in appx 1/2 year.  He remains full code and I think it we may need to lean towards palliative approach at this point. He is being seen by palliative care team.   PAST MEDICAL HISTORY   Past Medical History:  Diagnosis Date   COPD (chronic obstructive pulmonary disease) (HCC)    Eating disorder    GERD (gastroesophageal reflux disease)    Liver disease    Seizures (HCC)    Sleep apnea      SURGICAL HISTORY   Past Surgical History:  Procedure Laterality Date   COLONOSCOPY WITH PROPOFOL N/A 12/05/2020   Procedure: COLONOSCOPY WITH PROPOFOL;  Surgeon: Regis Bill, MD;  Location: [facility] ENDOSCOPY;  Service: Endoscopy;  Laterality: N/A;   ESOPHAGOGASTRODUODENOSCOPY (EGD) WITH PROPOFOL N/A 02/25/2018   Procedure: ESOPHAGOGASTRODUODENOSCOPY (EGD) WITH PROPOFOL;  Surgeon: Wyline Mood, MD;  Location: [facility] ENDOSCOPY;  Service: Gastroenterology;  Laterality: N/A;   LESION EXCISION Left 12-15-13   follicular cyst   SKIN BIOPSY       FAMILY HISTORY   Family History  Problem Relation Age of Onset   Cancer Mother        breast     SOCIAL HISTORY   Social History   Tobacco Use  Smoking status: Every Day    Current packs/day: 0.50    Average packs/day: 0.5 packs/day for 30.0 years (15.0 ttl pk-yrs)    Types: Cigarettes   Smokeless tobacco: Never  Vaping Use   Vaping status: Never Used  Substance Use Topics   Alcohol use: No   Drug use: No     MEDICATIONS    Home Medication:    Current Medication:  Current Facility-Administered Medications:    acetaminophen (TYLENOL) tablet 650 mg, 650 mg, Oral, Q4H PRN, Mansy, Jan A, MD, 650 mg at 07/29/23 4540   acetylcysteine (MUCOMYST) 20 % nebulizer / oral solution 4 mL, 4 mL, Nebulization, BID, Karna Christmas, Raydon Chappuis, MD, 4 mL at 08/04/23 0750   benzonatate (TESSALON)  capsule 100 mg, 100 mg, Oral, TID, Renae Gloss, Richard, MD, 100 mg at 08/04/23 1533   Chlorhexidine Gluconate Cloth 2 % PADS 6 each, 6 each, Topical, Daily, Pennie Banter, DO, 6 each at 08/04/23 1109   chlorproMAZINE (THORAZINE) tablet 50 mg, 50 mg, Oral, QID, Leeroy Bock, MD, 50 mg at 08/04/23 1415   feeding supplement (ENSURE ENLIVE / ENSURE PLUS) liquid 237 mL, 237 mL, Oral, TID BM, Ira Busbin, MD, 237 mL at 08/04/23 1020   fluticasone furoate-vilanterol (BREO ELLIPTA) 100-25 MCG/ACT 1 puff, 1 puff, Inhalation, Daily, Wieting, Richard, MD, 1 puff at 08/04/23 0815   folic acid (FOLVITE) tablet 1 mg, 1 mg, Oral, Daily, Karna Christmas, Deandria Klute, MD, 1 mg at 08/04/23 1056   guaiFENesin (ROBITUSSIN) 100 MG/5ML liquid 5 mL, 5 mL, Oral, Q4H PRN, Renae Gloss, Richard, MD   ipratropium-albuterol (DUONEB) 0.5-2.5 (3) MG/3ML nebulizer solution 3 mL, 3 mL, Nebulization, Q4H PRN, Esaw Grandchild A, DO, 3 mL at 08/04/23 0750   lamoTRIgine (LAMICTAL) tablet 150 mg, 150 mg, Oral, BID, Rust-Chester, Britton L, NP, 150 mg at 08/04/23 1056   methylPREDNISolone sodium succinate (SOLU-MEDROL) 40 mg/mL injection 40 mg, 40 mg, Intravenous, Q24H, Esaw Grandchild A, DO, 40 mg at 08/04/23 1057   metoprolol succinate (TOPROL-XL) 24 hr tablet 25 mg, 25 mg, Oral, Daily, Esaw Grandchild A, DO, 25 mg at 08/04/23 1056   multivitamin with minerals tablet 1 tablet, 1 tablet, Oral, Daily, Karna Christmas, Melvina Pangelinan, MD, 1 tablet at 08/04/23 1056   nicotine (NICODERM CQ - dosed in mg/24 hours) patch 14 mg, 14 mg, Transdermal, Daily, Ezequiel Essex, NP, 14 mg at 08/04/23 1057   ondansetron (ZOFRAN) injection 4 mg, 4 mg, Intravenous, Q6H PRN, Esaw Grandchild A, DO, 4 mg at 07/31/23 2159   Oral care mouth rinse, 15 mL, Mouth Rinse, PRN, Esaw Grandchild A, DO   pantoprazole (PROTONIX) EC tablet 40 mg, 40 mg, Oral, BID, Wieting, Richard, MD, 40 mg at 08/04/23 1056   polyethylene glycol (MIRALAX / GLYCOLAX) packet 17 g, 17 g, Oral, BID, Wieting,  Richard, MD, 17 g at 08/04/23 1057   senna (SENOKOT) tablet 17.2 mg, 2 tablet, Oral, QHS, Wieting, Richard, MD, 17.2 mg at 08/02/23 2130   sertraline (ZOLOFT) tablet 25 mg, 25 mg, Oral, Daily, Morna Flud, MD, 25 mg at 08/04/23 1056   thiamine (VITAMIN B1) tablet 100 mg, 100 mg, Oral, Daily, Rust-Chester, Britton L, NP, 100 mg at 08/04/23 1056   traZODone (DESYREL) tablet 50 mg, 50 mg, Oral, QHS PRN, Rust-Chester, Britton L, NP, 50 mg at 07/30/23 2141   umeclidinium bromide (INCRUSE ELLIPTA) 62.5 MCG/ACT 1 puff, 1 puff, Inhalation, Daily, Alford Highland, MD, 1 puff at 08/04/23 0815    ALLERGIES   Patient has no known allergies.  REVIEW OF SYSTEMS    Review of Systems:  Gen:  Denies  fever, sweats, chills weigh loss  HEENT: Denies blurred vision, double vision, ear pain, eye pain, hearing loss, nose bleeds, sore throat Cardiac:  No dizziness, chest pain or heaviness, chest tightness,edema Resp:   reports dyspnea chronically  Gi: Denies swallowing difficulty, stomach pain, nausea or vomiting, diarrhea, constipation, bowel incontinence Gu:  Denies bladder incontinence, burning urine Ext:   Denies Joint pain, stiffness or swelling Skin: Denies  skin rash, easy bruising or bleeding or hives Endoc:  Denies polyuria, polydipsia , polyphagia or weight change Psych:   Denies depression, insomnia or hallucinations   Other:  All other systems negative   VS: BP (!) 100/58 (BP Location: Right Arm)   Pulse 87   Temp 98.6 F (37 C)   Resp (!) 22   Ht 5' 7.99" (1.727 m)   Wt 63.2 kg   SpO2 (!) 87%   BMI 21.19 kg/m      PHYSICAL EXAM    GENERAL:NAD, no fevers, chills, no weakness no fatigue HEAD: Normocephalic, atraumatic.  EYES: Pupils equal, round, reactive to light. Extraocular muscles intact. No scleral icterus.  MOUTH: Moist mucosal membrane. Dentition intact. No abscess noted.  EAR, NOSE, THROAT: Clear without exudates. No external lesions.  NECK: Supple. No  thyromegaly. No nodules. No JVD.  PULMONARY: decreased breath sounds with mild rhonchi worse at bases bilaterally.  CARDIOVASCULAR: S1 and S2. Regular rate and rhythm. No murmurs, rubs, or gallops. No edema. Pedal pulses 2+ bilaterally.  GASTROINTESTINAL: Soft, nontender, nondistended. No masses. Positive bowel sounds. No hepatosplenomegaly.  MUSCULOSKELETAL: No swelling, clubbing, or edema. Range of motion full in all extremities.  NEUROLOGIC: Cranial nerves II through XII are intact. No gross focal neurological deficits. Sensation intact. Reflexes intact.  SKIN: No ulceration, lesions, rashes, or cyanosis. Skin warm and dry. Turgor intact.  PSYCHIATRIC: Mood, affect within normal limits. The patient is awake, alert and oriented x 3. Insight, judgment intact.       IMAGING     CT CHEST WITH BILATERAL BRONCHITIC CHANGES, ANTERIORLY ON RIGHT THERE IS A LARGE BULLA AT RIGHT LOWER LOBE, POSTERIORLY THERE IS ATELECTASIS AT LEFT LOWER LOBE.    ASSESSMENT/PLAN   Acute on chronic hypoxemic respiratory failure     - noted residual bronchitic changes - agree with current solumedrol       - agree with unasyn - patient noted to regurgitate while using incentive spirometry and may have episodic aspirations      -continue COPD care path with neb treatments       -initiate mucomyst 20% - 4ml BID  COPD-chronic with exacerbation post influenza -continue solumedrol 40mg  -BNP is normal unlikely overlap of CHF -continue current antimicrobials   Atelectasis   - worse at LLL  Starting metaneb with albuterol q8h x 3 d   Tobacco dependence   Continue nicotine replacement   OSA - CPAP at bedtime with setting by RT           Thank you for allowing me to participate in the care of this patient.   Patient/Family are satisfied with care plan and all questions have been answered.    Provider disclosure: Patient with at least one acute or chronic illness or injury that poses a threat to  life or bodily function and is being managed actively during this encounter.  All of the below services have been performed independently by signing provider:  review of prior documentation from internal  and or external health records.  Review of previous and current lab results.  Interview and comprehensive assessment during patient visit today. Review of current and previous chest radiographs/CT scans. Discussion of management and test interpretation with health care team and patient/family.   This document was prepared using Dragon voice recognition software and may include unintentional dictation errors.     Vida Rigger, M.D.  Division of Pulmonary & Critical Care Medicine

## 2023-08-04 NOTE — Assessment & Plan Note (Signed)
 No recurrence.  Likely from heated high flow nasal cannula prolonged use.  Aspirin and Lovenox discontinued.

## 2023-08-04 NOTE — Plan of Care (Signed)
  Problem: Activity: Goal: Ability to tolerate increased activity will improve Outcome: Progressing   Problem: Respiratory: Goal: Ability to maintain a clear airway and adequate ventilation will improve Outcome: Progressing   Problem: Role Relationship: Goal: Method of communication will improve Outcome: Progressing   Problem: Education: Goal: Knowledge of General Education information will improve Description: Including pain rating scale, medication(s)/side effects and non-pharmacologic comfort measures Outcome: Progressing   Problem: Health Behavior/Discharge Planning: Goal: Ability to manage health-related needs will improve Outcome: Progressing   Problem: Clinical Measurements: Goal: Ability to maintain clinical measurements within normal limits will improve Outcome: Progressing Goal: Will remain free from infection Outcome: Progressing Goal: Diagnostic test results will improve Outcome: Progressing Goal: Respiratory complications will improve Outcome: Progressing Goal: Cardiovascular complication will be avoided Outcome: Progressing   Problem: Activity: Goal: Risk for activity intolerance will decrease Outcome: Progressing   Problem: Nutrition: Goal: Adequate nutrition will be maintained Outcome: Progressing   Problem: Coping: Goal: Level of anxiety will decrease Outcome: Progressing   Problem: Elimination: Goal: Will not experience complications related to bowel motility Outcome: Progressing Goal: Will not experience complications related to urinary retention Outcome: Progressing   Problem: Pain Managment: Goal: General experience of comfort will improve and/or be controlled Outcome: Progressing   Problem: Safety: Goal: Ability to remain free from injury will improve Outcome: Progressing   Problem: Skin Integrity: Goal: Risk for impaired skin integrity will decrease Outcome: Progressing   Problem: Education: Goal: Ability to describe self-care  measures that may prevent or decrease complications (Diabetes Survival Skills Education) will improve Outcome: Progressing Goal: Individualized Educational Video(s) Outcome: Progressing   Problem: Coping: Goal: Ability to adjust to condition or change in health will improve Outcome: Progressing   Problem: Fluid Volume: Goal: Ability to maintain a balanced intake and output will improve Outcome: Progressing   Problem: Health Behavior/Discharge Planning: Goal: Ability to identify and utilize available resources and services will improve Outcome: Progressing Goal: Ability to manage health-related needs will improve Outcome: Progressing   Problem: Metabolic: Goal: Ability to maintain appropriate glucose levels will improve Outcome: Progressing   Problem: Nutritional: Goal: Maintenance of adequate nutrition will improve Outcome: Progressing Goal: Progress toward achieving an optimal weight will improve Outcome: Progressing   Problem: Skin Integrity: Goal: Risk for impaired skin integrity will decrease Outcome: Progressing   Problem: Tissue Perfusion: Goal: Adequacy of tissue perfusion will improve Outcome: Progressing

## 2023-08-04 NOTE — Progress Notes (Signed)
 Progress Note   [name] AYT:[id number] DOB: [birth year] DOA: 07/12/2023     23 DOS: the patient was seen and examined on 08/04/2023   Brief hospital course: 68 year old male with a past medical history significant for COPD, alcohol dependence, and seizure disorder who presented to [facility] ED on 07/12/2023 from his group home due to altered mental status.  Per report he has been altered over the last 24 hours.   On arrival to the ED he remained somnolent and altered, oriented only to self.  Given his respiratory distress he was placed on BiPAP and eventually required intubation and levophed.  Positive for influenza A. Started on tamiflu.    07/15/23- patient passed SBT today.  He is liberated from MV on to [facility]. Remains with encephalopathy.  07/16/23- patient is improved overnight. He is on home psychiatric medications now.  3/8 -- weaned to 10 L/min HFNC O2 3/9 -- O2 sats decompensated early AM, transferred to stepdown, back on heated HF o2 at 70-80%FiO2 - resume IV steroids.  Negative RVP, normal BMP, normal LA, CXR neg 3/10 -- remains on heated HF O2 50 L/min 65% FiO2.  Negative CTA chest for PE, but showed new RLL infiltrate, possible and started on Unasyn 3/11 -- 40 L/min 60-65% fiO2 3/12.  Patient on heated high flow nasal cannula 49% oxygen this morning with 40 L flow.  Pulmonary reconsulted. 3/13.  Patient still on 47% oxygen and 40 L flow on heated high flow nasal cannula.  Patient had a nosebleed this morning requiring me to stop aspirin and Lovenox injections and give Afrin nasal spray. 3/14.  No further nosebleed.  Awaiting insurance authorization for LTAC.  Still on heated high flow nasal cannula around 51% oxygen and 40 L flow as of this morning. 3/15.  Patient down to 34% oxygen this am heated high flow nasal cannula when I saw him this morning on 40 L flow. 3/16.  Patient was up to 60% oxygen on heated high flow nasal cannula 50 L flow and respiratory therapist was able  to get down to 45% oxygen 45 L flow.   Assessment and Plan: * Acute respiratory failure with hypoxia and hypercapnia (HCC) Patient on heated high flow nasal cannula 34% oxygen this morning with 40 L flow.  Appreciate pulmonary consultation.  Patient intubated during the hospital course.  Added inhalers on 3/12.  Patient on Solu-Medrol.  Completed Unasyn.  Incentive spirometry and MetaNeb treatments.  Awaiting insurance authorization for LTAC.  Septic shock (HCC) Present on admission and now has resolved.  Seizures (HCC) Continue Lamictal  Malnutrition of moderate degree Continue supplements  Gastroesophageal reflux disease PPI to twice daily.  Patient has dilated fluid-filled esophagus which can increase risk of of aspiration.  Patient does not complain of any nausea or vomiting.  Wondering if patient has achalasia.  Requiring too much oxygen for any endoscopy procedure at this point.  Patient's sister told me that he has had hiccups in the past and used to vomit in order to make himself stop hiccuping.  Influenza A with pneumonia On admission  Epistaxis A few days ago.  No recurrence.  Likely from heated high flow nasal cannula prolonged use.  Aspirin and Lovenox discontinued.  AKI (acute kidney injury) (HCC) Resolved.  Creatinine 2.0 on admission and creatinine 0.97 on last check.  Acute metabolic encephalopathy Secondary to septic shock        Subjective: Patient feels okay.  Some shortness of breath.  Advised he must  get up and eat with meals.  Received a MetaNeb prior to me seeing him.  Physical Exam: Vitals:   08/04/23 0557 08/04/23 0747 08/04/23 0750 08/04/23 0804  BP:  120/65    Pulse:  85    Resp:      Temp:  98.6 F (37 C)    TempSrc:      SpO2: 94% 95% 94% 91%  Weight:      Height:       Physical Exam HENT:     Head: Normocephalic.     Mouth/Throat:     Pharynx: No oropharyngeal exudate.  Eyes:     General: Lids are normal.     Conjunctiva/sclera:  Conjunctivae normal.  Cardiovascular:     Rate and Rhythm: Normal rate and regular rhythm.     Heart sounds: Normal heart sounds, S1 normal and S2 normal.  Pulmonary:     Breath sounds: Examination of the right-middle field reveals decreased breath sounds. Examination of the right-lower field reveals decreased breath sounds and rhonchi. Examination of the left-lower field reveals decreased breath sounds. Decreased breath sounds and rhonchi present. No wheezing or rales.  Abdominal:     Palpations: Abdomen is soft.     Tenderness: There is no abdominal tenderness.  Musculoskeletal:     Right lower leg: No swelling.     Left lower leg: No swelling.  Skin:    General: Skin is warm.     Findings: No rash.  Neurological:     Mental Status: He is alert.     Data Reviewed: Creatinine 0.97, white blood count 11.8, hemoglobin 11.9, platelet count 244  Family Communication: Spoke with sister yesterday  Disposition: Status is: Inpatient Remains inpatient appropriate because: Awaiting insurance authorization for LTAC.  Still on heated high flow nasal cannula prolonged course.  Planned Discharge Destination: LTAC    Time spent: 28 minutes  Author: Alford Highland, MD 08/04/2023 10:36 AM  For on call review [email or web address].

## 2023-08-05 ENCOUNTER — Inpatient Hospital Stay

## 2023-08-05 DIAGNOSIS — R569 Unspecified convulsions: Secondary | ICD-10-CM | POA: Diagnosis not present

## 2023-08-05 DIAGNOSIS — A419 Sepsis, unspecified organism: Secondary | ICD-10-CM | POA: Diagnosis not present

## 2023-08-05 DIAGNOSIS — J690 Pneumonitis due to inhalation of food and vomit: Secondary | ICD-10-CM

## 2023-08-05 DIAGNOSIS — J9601 Acute respiratory failure with hypoxia: Secondary | ICD-10-CM | POA: Diagnosis not present

## 2023-08-05 DIAGNOSIS — J189 Pneumonia, unspecified organism: Secondary | ICD-10-CM | POA: Diagnosis not present

## 2023-08-05 HISTORY — DX: Pneumonia, unspecified organism: J18.9

## 2023-08-05 LAB — MRSA NEXT GEN BY PCR, NASAL: MRSA by PCR Next Gen: NOT DETECTED

## 2023-08-05 LAB — C-REACTIVE PROTEIN: CRP: 3 mg/dL — ABNORMAL HIGH (ref <1.0)

## 2023-08-05 LAB — PROCALCITONIN: Procalcitonin: <0.1 ng/mL

## 2023-08-05 MED ORDER — PREDNISONE 20 MG PO TABS
30.0000 mg | ORAL_TABLET | Freq: Every day | ORAL | Status: DC
  Administered 2023-08-06 – 2023-08-12 (×7): 30 mg via ORAL
  Filled 2023-08-05 (×7): qty 1

## 2023-08-05 NOTE — Progress Notes (Signed)
 PULMONOLOGY         Date: 08/05/2023,   MRN# [id number] Jeremiah Nguyen [birth year]     AdmissionWeight: 68 kg                 CurrentWeight: 63.2 kg  Referring provider: Dr Renae Gloss   CHIEF COMPLAINT:   Acute on chronic hypoxemic respiratory failure   HISTORY OF PRESENT ILLNESS   This is a 68 yo M with hx of COPD, seizure disorder, liver disease, GERD who intially came in 06/2023 with AECOPD with influenza A infection.  He was found to be hypoxemic and altered with encephalopathy. He was treated for bacterial pneumonia and required ICU hospitalization. He developed septic shock and was intubated placed on MV and was on vasopressor support while in intensive care. He eventually improved and was weaned from MV.  At baseline he does have psychiatric disease and his home regimen was restarted.  He also developed component of metabolic encephalopathy presumably from renal impairment. Despite full scope of therapy he continues to require HFNC support and is on 50%/40L.  He does use IS but seems to be labored and at times regurgitates whittish phlegm. He had CT chest PE protocol performed 07/29/23 , I reviewed this independently and placed pictorial documentation below. PCCM consultation for further evaluation and management.    Patient seen at bedside. He is conversive and is lucid.  He is in good spirits wants to walk.  His nasal passages seem to be more obstructed then before with clotted debri.  I think we should place him in face mask Oxygen therapy to help [place] wean down his O2 requirement.    08/03/23- patient seen at bedside, he had large diarreah during my eval.  He was weaned down to 38%/40.  Reviewed with RT regarding weaning to venti mask or NRB with goal SPO2 >90%.  He seems improved clinically and is able to participate with metaneb and mucomyst  08/04/23- patient still on HFNC now on 45/45 but resting with spO2 >95% during examination.  He's receiving maximal medical therapy  with steroids, antibiotics, metaneb recruitment and mucomyst, despite this has had poor improvement.  Overall has not done well in the past year and has not walked in appx 1/2 year.  He remains full code and I think it we may need to lean towards palliative approach at this point. He is being seen by palliative care team.   08/05/23- patient is still with significant O2 req and minimal improvement over past 1 week.  He does follow instructions and reports feeling better but admits his baseline is very frail and weak. He relates being bedbound due to lower extermity weakness for past 6 months or so.  There is plan for [facility] admission due to expected need for longer therapy and recovery.  PAST MEDICAL HISTORY   Past Medical History:  Diagnosis Date   COPD (chronic obstructive pulmonary disease) (HCC)    Eating disorder    GERD (gastroesophageal reflux disease)    Liver disease    Seizures (HCC)    Sleep apnea      SURGICAL HISTORY   Past Surgical History:  Procedure Laterality Date   COLONOSCOPY WITH PROPOFOL N/A 12/05/2020   Procedure: COLONOSCOPY WITH PROPOFOL;  Surgeon: Regis Bill, MD;  Location: [facility] ENDOSCOPY;  Service: Endoscopy;  Laterality: N/A;   ESOPHAGOGASTRODUODENOSCOPY (EGD) WITH PROPOFOL N/A 02/25/2018   Procedure: ESOPHAGOGASTRODUODENOSCOPY (EGD) WITH PROPOFOL;  Surgeon: Wyline Mood, MD;  Location: [facility] ENDOSCOPY;  Service: Gastroenterology;  Laterality: N/A;   LESION EXCISION Left 12-15-13   follicular cyst   SKIN BIOPSY       FAMILY HISTORY   Family History  Problem Relation Age of Onset   Cancer Mother        breast     SOCIAL HISTORY   Social History   Tobacco Use   Smoking status: Every Day    Current packs/day: 0.50    Average packs/day: 0.5 packs/day for 30.0 years (15.0 ttl pk-yrs)    Types: Cigarettes   Smokeless tobacco: Never  Vaping Use   Vaping status: Never Used  Substance Use Topics   Alcohol use: No   Drug use: No      MEDICATIONS    Home Medication:    Current Medication:  Current Facility-Administered Medications:    acetaminophen (TYLENOL) tablet 650 mg, 650 mg, Oral, Q4H PRN, Mansy, Jan A, MD, 650 mg at 07/29/23 5784   benzonatate (TESSALON) capsule 100 mg, 100 mg, Oral, TID, Renae Gloss, Richard, MD, 100 mg at 08/04/23 2028   Chlorhexidine Gluconate Cloth 2 % PADS 6 each, 6 each, Topical, Daily, Pennie Banter, DO, 6 each at 08/04/23 1109   chlorproMAZINE (THORAZINE) tablet 50 mg, 50 mg, Oral, QID, Leeroy Bock, MD, 50 mg at 08/04/23 2028   feeding supplement (ENSURE ENLIVE / ENSURE PLUS) liquid 237 mL, 237 mL, Oral, TID BM, Yanky Vanderburg, MD, 237 mL at 08/04/23 2000   fluticasone furoate-vilanterol (BREO ELLIPTA) 100-25 MCG/ACT 1 puff, 1 puff, Inhalation, Daily, Wieting, Richard, MD, 1 puff at 08/04/23 0815   folic acid (FOLVITE) tablet 1 mg, 1 mg, Oral, Daily, Karna Christmas, Kieanna Rollo, MD, 1 mg at 08/04/23 1056   guaiFENesin (ROBITUSSIN) 100 MG/5ML liquid 5 mL, 5 mL, Oral, Q4H PRN, Renae Gloss, Richard, MD   ipratropium-albuterol (DUONEB) 0.5-2.5 (3) MG/3ML nebulizer solution 3 mL, 3 mL, Nebulization, Q4H PRN, Esaw Grandchild A, DO, 3 mL at 08/04/23 1955   lamoTRIgine (LAMICTAL) tablet 150 mg, 150 mg, Oral, BID, Rust-Chester, Micheline Rough L, NP, 150 mg at 08/04/23 2028   methylPREDNISolone sodium succinate (SOLU-MEDROL) 40 mg/mL injection 40 mg, 40 mg, Intravenous, Q24H, Esaw Grandchild A, DO, 40 mg at 08/04/23 1057   metoprolol succinate (TOPROL-XL) 24 hr tablet 25 mg, 25 mg, Oral, Daily, Esaw Grandchild A, DO, 25 mg at 08/04/23 1056   multivitamin with minerals tablet 1 tablet, 1 tablet, Oral, Daily, Karna Christmas, Aryiah Monterosso, MD, 1 tablet at 08/04/23 1056   nicotine (NICODERM CQ - dosed in mg/24 hours) patch 14 mg, 14 mg, Transdermal, Daily, Ezequiel Essex, NP, 14 mg at 08/04/23 1057   ondansetron (ZOFRAN) injection 4 mg, 4 mg, Intravenous, Q6H PRN, Esaw Grandchild A, DO, 4 mg at 07/31/23 2159   Oral care  mouth rinse, 15 mL, Mouth Rinse, PRN, Esaw Grandchild A, DO   pantoprazole (PROTONIX) EC tablet 40 mg, 40 mg, Oral, BID, Wieting, Richard, MD, 40 mg at 08/04/23 2028   polyethylene glycol (MIRALAX / GLYCOLAX) packet 17 g, 17 g, Oral, BID, Wieting, Richard, MD, 17 g at 08/04/23 1057   senna (SENOKOT) tablet 17.2 mg, 2 tablet, Oral, QHS, Wieting, Richard, MD, 17.2 mg at 08/02/23 2130   sertraline (ZOLOFT) tablet 25 mg, 25 mg, Oral, Daily, Mort Smelser, MD, 25 mg at 08/04/23 1056   thiamine (VITAMIN B1) tablet 100 mg, 100 mg, Oral, Daily, Rust-Chester, Britton L, NP, 100 mg at 08/04/23 1056   traZODone (DESYREL) tablet 50 mg, 50 mg, Oral, QHS PRN, Rust-Chester, Cecelia Byars,  NP, 50 mg at 07/30/23 2141   umeclidinium bromide (INCRUSE ELLIPTA) 62.5 MCG/ACT 1 puff, 1 puff, Inhalation, Daily, Alford Highland, MD, 1 puff at 08/04/23 0815    ALLERGIES   Patient has no known allergies.     REVIEW OF SYSTEMS    Review of Systems:  Gen:  Denies  fever, sweats, chills weigh loss  HEENT: Denies blurred vision, double vision, ear pain, eye pain, hearing loss, nose bleeds, sore throat Cardiac:  No dizziness, chest pain or heaviness, chest tightness,edema Resp:   reports dyspnea chronically  Gi: Denies swallowing difficulty, stomach pain, nausea or vomiting, diarrhea, constipation, bowel incontinence Gu:  Denies bladder incontinence, burning urine Ext:   Denies Joint pain, stiffness or swelling Skin: Denies  skin rash, easy bruising or bleeding or hives Endoc:  Denies polyuria, polydipsia , polyphagia or weight change Psych:   Denies depression, insomnia or hallucinations   Other:  All other systems negative   VS: BP (!) 105/58   Pulse 75   Temp 97.9 F (36.6 C) (Oral)   Resp (!) 24   Ht 5' 7.99" (1.727 m)   Wt 63.2 kg   SpO2 94%   BMI 21.19 kg/m      PHYSICAL EXAM    GENERAL:NAD, no fevers, chills, no weakness no fatigue HEAD: Normocephalic, atraumatic.  EYES: Pupils equal,  round, reactive to light. Extraocular muscles intact. No scleral icterus.  MOUTH: Moist mucosal membrane. Dentition intact. No abscess noted.  EAR, NOSE, THROAT: Clear without exudates. No external lesions.  NECK: Supple. No thyromegaly. No nodules. No JVD.  PULMONARY: decreased breath sounds with mild rhonchi worse at bases bilaterally.  CARDIOVASCULAR: S1 and S2. Regular rate and rhythm. No murmurs, rubs, or gallops. No edema. Pedal pulses 2+ bilaterally.  GASTROINTESTINAL: Soft, nontender, nondistended. No masses. Positive bowel sounds. No hepatosplenomegaly.  MUSCULOSKELETAL: No swelling, clubbing, or edema. Range of motion full in all extremities.  NEUROLOGIC: Cranial nerves II through XII are intact. No gross focal neurological deficits. Sensation intact. Reflexes intact.  SKIN: No ulceration, lesions, rashes, or cyanosis. Skin warm and dry. Turgor intact.  PSYCHIATRIC: Mood, affect within normal limits. The patient is awake, alert and oriented x 3. Insight, judgment intact.       IMAGING     CT CHEST WITH BILATERAL BRONCHITIC CHANGES, ANTERIORLY ON RIGHT THERE IS A LARGE BULLA AT RIGHT LOWER LOBE, POSTERIORLY THERE IS ATELECTASIS AT LEFT LOWER LOBE.    ASSESSMENT/PLAN   Acute on chronic hypoxemic respiratory failure     - noted residual bronchitic changes - agree with current solumedrol       - agree with unasyn - patient noted to regurgitate while using incentive spirometry and may have episodic aspirations      -continue COPD care path with neb treatments       -continue mucomyst 20% - 4ml BID  COPD-chronic with exacerbation post influenza -continue solumedrol 40mg - can wean to prednisone with taper -BNP is normal unlikely overlap of CHF -continue current antimicrobials   Atelectasis -RESOLVED  - worse at LLL  Starting metaneb with albuterol q8h x 3 d   Tobacco dependence   Continue nicotine replacement   OSA - CPAP at bedtime with setting by RT            Thank you for allowing me to participate in the care of this patient.   Patient/Family are satisfied with care plan and all questions have been answered.    Provider disclosure: Patient with at  least one acute or chronic illness or injury that poses a threat to life or bodily function and is being managed actively during this encounter.  All of the below services have been performed independently by signing provider:  review of prior documentation from internal and or external health records.  Review of previous and current lab results.  Interview and comprehensive assessment during patient visit today. Review of current and previous chest radiographs/CT scans. Discussion of management and test interpretation with health care team and patient/family.   This document was prepared using Dragon voice recognition software and may include unintentional dictation errors.     Vida Rigger, M.D.  Division of Pulmonary & Critical Care Medicine

## 2023-08-05 NOTE — Assessment & Plan Note (Signed)
 Repeat chest x-ray read as worsening infiltrates.  Patient had Rocephin and Zithromax earlier in the hospital course and recently Unasyn.  Will get an MRSA PCR, CRP and procalcitonin to determine whether or not to give another course of antibiotics or just watch clinically.

## 2023-08-05 NOTE — Progress Notes (Signed)
 Progress Note   [name] BJY:[id number] DOB: Oct 25, 1955 DOA: 07/12/2023     24 DOS: the patient was seen and examined on 08/05/2023   Brief hospital course: 68 year old male with a past medical history significant for COPD, alcohol dependence, and seizure disorder who presented to [facility] ED on 07/12/2023 from his group home due to altered mental status.  Per report he has been altered over the last 24 hours.   On arrival to the ED he remained somnolent and altered, oriented only to self.  Given his respiratory distress he was placed on BiPAP and eventually required intubation and levophed.  Positive for influenza A. Started on tamiflu.    07/15/23- patient passed SBT today.  He is liberated from MV on to [facility]. Remains with encephalopathy.  07/16/23- patient is improved overnight. He is on home psychiatric medications now.  3/8 -- weaned to 10 L/min HFNC O2 3/9 -- O2 sats decompensated early AM, transferred to stepdown, back on heated HF o2 at 70-80%FiO2 - resume IV steroids.  Negative RVP, normal BMP, normal LA, CXR neg 3/10 -- remains on heated HF O2 50 L/min 65% FiO2.  Negative CTA chest for PE, but showed new RLL infiltrate, possible and started on Unasyn 3/11 -- 40 L/min 60-65% fiO2 3/12.  Patient on heated high flow nasal cannula 49% oxygen this morning with 40 L flow.  Pulmonary reconsulted. 3/13.  Patient still on 47% oxygen and 40 L flow on heated high flow nasal cannula.  Patient had a nosebleed this morning requiring me to stop aspirin and Lovenox injections and give Afrin nasal spray. 3/14.  No further nosebleed.  Awaiting insurance authorization for LTAC.  Still on heated high flow nasal cannula around 51% oxygen and 40 L flow as of this morning. 3/15.  Patient down to 34% oxygen this am heated high flow nasal cannula when I saw him this morning on 40 L flow. 3/16.  Patient was up to 60% oxygen on heated high flow nasal cannula 50 L flow and respiratory therapist was able  to get down to 45% oxygen 45 L flow. 3/17.  Patient was on 50% oxygen 45 L flow when I saw him today.  I had to do a peer to peer for LTAC approval.  Repeat chest x-ray still showing infiltrates.  Will repeat MRSA PCR.  Repeat CRP and obtain a procalcitonin.   Assessment and Plan: * Acute respiratory failure with hypoxia and hypercapnia (HCC) Patient on heated high flow nasal cannula 50% oxygen this morning with 45 L flow.  Appreciate pulmonary consultation.  Patient intubated during the hospital course.  Added inhalers on 3/12.  Will switch Solu-Medrol over to prednisone for tomorrow.  Incentive spirometry and MetaNeb treatments.  Awaiting insurance authorization for LTAC.  Repeat chest x-ray still showing infiltrates.  Will repeat an MRSA PCR, CRP and procalcitonin.  (Patient completed Rocephin and Zithromax earlier in the hospital course and Unasyn recently)  Multifocal pneumonia Repeat chest x-ray read as worsening infiltrates.  Patient had Rocephin and Zithromax earlier in the hospital course and recently Unasyn.  Will get an MRSA PCR, CRP and procalcitonin to determine whether or not to give another course of antibiotics or just watch clinically.  Septic shock (HCC) Present on admission and now has resolved.  Seizures (HCC) Continue Lamictal  Malnutrition of moderate degree Continue supplements  Gastroesophageal reflux disease PPI to twice daily.  Patient has dilated fluid-filled esophagus which can increase risk of of aspiration.  Patient does  not complain of any nausea or vomiting.  Wondering if patient has achalasia.  Requiring too much oxygen for any endoscopy procedure at this point.  Patient's sister told me that he has had hiccups in the past and used to vomit in order to make himself stop hiccuping.  Influenza A with pneumonia On admission  Epistaxis No recurrence.  Likely from heated high flow nasal cannula prolonged use.  Aspirin and Lovenox discontinued.  AKI (acute  kidney injury) (HCC) Resolved.  Creatinine 2.0 on admission and creatinine 0.97 on last check.  Acute metabolic encephalopathy Secondary to septic shock        Subjective: Patient feels okay.  Occasional cough and some shortness of breath.  Admitted 24 days ago with septic shock.  Physical Exam: Vitals:   08/05/23 0500 08/05/23 0816 08/05/23 0939 08/05/23 1151  BP:   (!) 110/53 (!) 111/56  Pulse:   91 99  Resp:      Temp:   98.7 F (37.1 C) 98.6 F (37 C)  TempSrc:    Oral  SpO2:  94% 92% 90%  Weight: 63.2 kg     Height:       Physical Exam HENT:     Head: Normocephalic.     Mouth/Throat:     Pharynx: No oropharyngeal exudate.  Eyes:     General: Lids are normal.     Conjunctiva/sclera: Conjunctivae normal.  Cardiovascular:     Rate and Rhythm: Normal rate and regular rhythm.     Heart sounds: Normal heart sounds, S1 normal and S2 normal.  Pulmonary:     Breath sounds: Examination of the right-middle field reveals decreased breath sounds. Examination of the right-lower field reveals decreased breath sounds and rhonchi. Examination of the left-lower field reveals decreased breath sounds. Decreased breath sounds and rhonchi present. No wheezing or rales.  Abdominal:     Palpations: Abdomen is soft.     Tenderness: There is no abdominal tenderness.  Musculoskeletal:     Right lower leg: No swelling.     Left lower leg: No swelling.  Skin:    General: Skin is warm.     Findings: No rash.  Neurological:     Mental Status: He is alert.     Data Reviewed: Last white blood cell count 11.8, hemoglobin 11.9, creatinine 0.97  Disposition: Status is: Inpatient Remains inpatient appropriate because: Awaiting insurance authorization for LTAC  Planned Discharge Destination: Back facility    Time spent: 28 minutes  Author: Alford Highland, MD 08/05/2023 3:39 PM  For on call review [email or web address].

## 2023-08-05 NOTE — Progress Notes (Signed)
 Occupational Therapy Treatment Patient Details Name: Jeremiah Nguyen MRN: [id number] DOB: [birth year] Today's Date: 08/05/2023   History of present illness Patient is a 68 year old male with history of COPD, alcohol dependence, and seizure disorder who presented on 07/12/2023 from group home due to altered mental status. Positive for influenza A, required mechanical ventilation, now on HHFNC.   OT comments  Pt seen for skilled co-treatment with PT. Pt performing supine >sit with min A to EOB. Pt stands min-mod A of 2 from standard bed height with use of RW. Pt taking several side steps to the R and able to perform sit <>stand x 3 reps this session. Pt needing multiple seated rest breaks secondary to O2 saturation being in 80% with activity. Pt refusing to sit in recliner chair and placed into chair position at end of session. Call bell and all needed items within reach.       If plan is discharge home, recommend the following:  Help with stairs or ramp for entrance;Supervision due to cognitive status;A lot of help with bathing/dressing/bathroom;Two people to help with walking and/or transfers   Equipment Recommendations  Other (comment) (defer to next venue of care)       Precautions / Restrictions Precautions Precautions: Fall Precaution/Restrictions Comments: monitor Sp02       Mobility Bed Mobility Overal bed mobility: Needs Assistance Bed Mobility: Supine to Sit, Sit to Supine     Supine to sit: Min assist Sit to supine: Min assist, +2 for physical assistance        Transfers Overall transfer level: Needs assistance Equipment used: 2 person hand held assist, Rolling walker (2 wheels) Transfers: Sit to/from Stand Sit to Stand: Min assist, +2 physical assistance, Mod assist           General transfer comment: Pt performs sit <>stand x 3 reps from EOB with min A of 2 from standard bed height and side steps to the R with min A of 2 for safety.     Balance Overall balance  assessment: Needs assistance Sitting-balance support: Feet supported Sitting balance-Leahy Scale: Good Sitting balance - Comments: sitting time around ~12 minutes   Standing balance support: Bilateral upper extremity supported, During functional activity, Reliant on assistive device for balance Standing balance-Leahy Scale: Fair Standing balance comment: external support required                           ADL either performed or assessed with clinical judgement    Extremity/Trunk Assessment Upper Extremity Assessment Upper Extremity Assessment: Generalized weakness   Lower Extremity Assessment Lower Extremity Assessment: Generalized weakness        Vision Patient Visual Report: No change from baseline           Communication Communication Communication: Impaired Factors Affecting Communication: Difficulty expressing self   Cognition Arousal: Alert Behavior During Therapy: Flat affect Cognition: Cognition impaired, History of cognitive impairments             OT - Cognition Comments: Pt follows one step commands consistently.                 Following commands: Impaired Following commands impaired: Follows one step commands with increased time      Cueing   Cueing Techniques: Verbal cues, Gestural cues, Tactile cues             Pertinent Vitals/ Pain       Pain Assessment Pain Assessment: Faces  Faces Pain Scale: No hurt         Frequency  Min 2X/week        Progress Toward Goals  OT Goals(current goals can now be found in the care plan section)  Progress towards OT goals: Progressing toward goals         Co-evaluation    PT/OT/SLP Co-Evaluation/Treatment: Yes Reason for Co-Treatment: Complexity of the patient's impairments (multi-system involvement);For patient/therapist safety PT goals addressed during session: Mobility/safety with mobility OT goals addressed during session: ADL's and self-care      AM-PAC OT "6  Clicks" Daily Activity     Outcome Measure   Help from another person eating meals?: A Little Help from another person taking care of personal grooming?: A Little Help from another person toileting, which includes using toliet, bedpan, or urinal?: A Lot Help from another person bathing (including washing, rinsing, drying)?: A Lot Help from another person to put on and taking off regular upper body clothing?: A Little Help from another person to put on and taking off regular lower body clothing?: A Lot 6 Click Score: 15    End of Session Equipment Utilized During Treatment: Oxygen (45Ls HFNC)  OT Visit Diagnosis: Other abnormalities of gait and mobility (R26.89);Muscle weakness (generalized) (M62.81)   Activity Tolerance Patient tolerated treatment well   Patient Left in bed;with call bell/phone within reach;with bed alarm set;Other (comment) (in chair position)   Nurse Communication Mobility status        Time: 1610-9604 OT Time Calculation (min): 23 min  Charges: OT General Charges $OT Visit: 1 Visit OT Treatments $Therapeutic Activity: 8-22 mins  Jackquline Denmark, MS, OTR/L , CBIS ascom [phone]  08/05/23, 1:51 PM

## 2023-08-05 NOTE — TOC Progression Note (Addendum)
 Transition of Care [facility]) - Progression Note    Patient Details  Name: Jeremiah Nguyen MRN: [id number] Date of Birth: [birth year]  [facility] CM/SW Contact  Truddie Hidden, RN Phone Number: 08/05/2023, 10:30 AM  Clinical Narrative:    Per Consuello Closs at [employer], [employer] auth is still pending.   Per [facility] assistant  a peer to peer is being requested.MD can call [phone] option 5. Deadline for call is 3.17.25 at 4pm EST. Patient information provided to MD. The following provided. DOB:12/30/1955 Plan QM:[id number]. MD notified.         Expected Discharge Plan and Services                                               Social Determinants of Health (SDOH) Interventions SDOH Screenings   Food Insecurity: Patient Unable To Answer (07/13/2023)  Housing: Patient Unable To Answer (07/13/2023)  Transportation Needs: Patient Unable To Answer (07/13/2023)  Utilities: Patient Unable To Answer (07/13/2023)  Depression (PHQ2-9): Low Risk  (02/25/2019)  Financial Resource Strain: Low Risk  (06/30/2018)  Physical Activity: Inactive (06/30/2018)  Social Connections: Patient Unable To Answer (07/13/2023)  Stress: Stress Concern Present (06/30/2018)  Tobacco Use: High Risk (07/12/2023)    Readmission Risk Interventions     No data to display

## 2023-08-05 NOTE — Plan of Care (Signed)
  Problem: Activity: Goal: Ability to tolerate increased activity will improve Outcome: Progressing   Problem: Respiratory: Goal: Ability to maintain a clear airway and adequate ventilation will improve Outcome: Progressing   Problem: Role Relationship: Goal: Method of communication will improve Outcome: Progressing   Problem: Education: Goal: Knowledge of General Education information will improve Description: Including pain rating scale, medication(s)/side effects and non-pharmacologic comfort measures Outcome: Progressing   Problem: Health Behavior/Discharge Planning: Goal: Ability to manage health-related needs will improve Outcome: Progressing   Problem: Clinical Measurements: Goal: Ability to maintain clinical measurements within normal limits will improve Outcome: Progressing Goal: Will remain free from infection Outcome: Progressing Goal: Diagnostic test results will improve Outcome: Progressing Goal: Respiratory complications will improve Outcome: Progressing Goal: Cardiovascular complication will be avoided Outcome: Progressing   Problem: Activity: Goal: Risk for activity intolerance will decrease Outcome: Progressing   Problem: Nutrition: Goal: Adequate nutrition will be maintained Outcome: Progressing   Problem: Coping: Goal: Level of anxiety will decrease Outcome: Progressing   Problem: Elimination: Goal: Will not experience complications related to bowel motility Outcome: Progressing Goal: Will not experience complications related to urinary retention Outcome: Progressing   Problem: Pain Managment: Goal: General experience of comfort will improve and/or be controlled Outcome: Progressing   Problem: Safety: Goal: Ability to remain free from injury will improve Outcome: Progressing   Problem: Skin Integrity: Goal: Risk for impaired skin integrity will decrease Outcome: Progressing   Problem: Education: Goal: Ability to describe self-care  measures that may prevent or decrease complications (Diabetes Survival Skills Education) will improve Outcome: Progressing Goal: Individualized Educational Video(s) Outcome: Progressing   Problem: Coping: Goal: Ability to adjust to condition or change in health will improve Outcome: Progressing   Problem: Fluid Volume: Goal: Ability to maintain a balanced intake and output will improve Outcome: Progressing   Problem: Health Behavior/Discharge Planning: Goal: Ability to identify and utilize available resources and services will improve Outcome: Progressing Goal: Ability to manage health-related needs will improve Outcome: Progressing   Problem: Metabolic: Goal: Ability to maintain appropriate glucose levels will improve Outcome: Progressing   Problem: Nutritional: Goal: Maintenance of adequate nutrition will improve Outcome: Progressing Goal: Progress toward achieving an optimal weight will improve Outcome: Progressing   Problem: Skin Integrity: Goal: Risk for impaired skin integrity will decrease Outcome: Progressing   Problem: Tissue Perfusion: Goal: Adequacy of tissue perfusion will improve Outcome: Progressing

## 2023-08-05 NOTE — Progress Notes (Signed)
 Physical Therapy Treatment Patient Details Name: RENTON BERKLEY MRN: [id number] DOB: [birth year] Today's Date: 08/05/2023   History of Present Illness Patient is a 68 year old male with history of COPD, alcohol dependence, and seizure disorder who presented on 07/12/2023 from group home due to altered mental status. Positive for influenza A, required mechanical ventilation, now on HHFNC.    PT Comments  Patient seen in conjunction with OT to maximize patient's tolerance to activity. Required minA for bed mobility and min-modA+2 to stand from EOB with RW. Able to perform repeated sit to stand x 3 from EOB. Required multiple prolonged seated rest breaks (~3 minutes) 2/2 spO2 dropping to 80% with activity. Placed in chair position at end of session. Discharge plan remains appropriate.     If plan is discharge home, recommend the following: Two people to help with walking and/or transfers;A lot of help with bathing/dressing/bathroom;Assist for transportation;Help with stairs or ramp for entrance;Supervision due to cognitive status;Assistance with cooking/housework;Assistance with feeding;Direct supervision/assist for medications management;Direct supervision/assist for financial management   Can travel by private vehicle     No  Equipment Recommendations  Other (comment)    Recommendations for Other Services       Precautions / Restrictions Precautions Precautions: Fall Precaution/Restrictions Comments: monitor Sp02     Mobility  Bed Mobility Overal bed mobility: Needs Assistance Bed Mobility: Supine to Sit, Sit to Supine     Supine to sit: Min assist Sit to supine: Min assist, +2 for physical assistance        Transfers Overall transfer level: Needs assistance Equipment used: 2 person hand held assist, Rolling Kishan [place] (2 wheels) Transfers: Sit to/from Stand Sit to Stand: Min assist, +2 physical assistance, Mod assist           General transfer comment: Pt performs sit  <>stand x 3 reps from EOB with min A of 2 from standard bed height and side steps to the R with min A of 2 for safety.    Ambulation/Gait                   Stairs             Wheelchair Mobility     Tilt Bed    Modified Rankin (Stroke Patients Only)       Balance Overall balance assessment: Needs assistance Sitting-balance support: Feet supported Sitting balance-Leahy Scale: Good     Standing balance support: Bilateral upper extremity supported, During functional activity, Reliant on assistive device for balance Standing balance-Leahy Scale: Fair                              Hotel manager: Impaired Factors Affecting Communication: Difficulty expressing self  Cognition Arousal: Alert Behavior During Therapy: Flat affect   PT - Cognitive impairments: No apparent impairments, Memory, Sequencing                         Following commands: Impaired Following commands impaired: Follows one step commands with increased time    Cueing Cueing Techniques: Verbal cues, Gestural cues, Tactile cues  Exercises      General Comments        Pertinent Vitals/Pain Pain Assessment Pain Assessment: Faces Faces Pain Scale: No hurt    Home Living  Prior Function            PT Goals (current goals can now be found in the care plan section) Acute Rehab PT Goals PT Goal Formulation: With patient Time For Goal Achievement: 08/14/23 Potential to Achieve Goals: Fair Progress towards PT goals: Progressing toward goals    Frequency    Min 2X/week      PT Plan      Co-evaluation PT/OT/SLP Co-Evaluation/Treatment: Yes Reason for Co-Treatment: Complexity of the patient's impairments (multi-system involvement);For patient/therapist safety PT goals addressed during session: Mobility/safety with mobility OT goals addressed during session: ADL's and self-care      AM-PAC  PT "6 Clicks" Mobility   Outcome Measure  Help needed turning from your back to your side while in a flat bed without using bedrails?: A Lot Help needed moving from lying on your back to sitting on the side of a flat bed without using bedrails?: A Lot Help needed moving to and from a bed to a chair (including a wheelchair)?: A Lot Help needed standing up from a chair using your arms (e.g., wheelchair or bedside chair)?: A Lot Help needed to walk in hospital room?: Total Help needed climbing 3-5 steps with a railing? : Total 6 Click Score: 10    End of Session Equipment Utilized During Treatment: Oxygen Activity Tolerance: Patient tolerated treatment well;Patient limited by fatigue Patient left: in bed;with call bell/phone within reach;with bed alarm set Nurse Communication: Mobility status PT Visit Diagnosis: Muscle weakness (generalized) (M62.81);Unsteadiness on feet (R26.81)     Time: 6962-9528 PT Time Calculation (min) (ACUTE ONLY): 23 min  Charges:    $Therapeutic Activity: 8-22 mins PT General Charges $$ ACUTE PT VISIT: 1 Visit                     Maylon Peppers, PT, DPT Physical Therapist - [facility]  [facility]    Zuhair Lariccia A Genasis Zingale 08/05/2023, 2:05 PM

## 2023-08-06 DIAGNOSIS — G9341 Metabolic encephalopathy: Secondary | ICD-10-CM | POA: Diagnosis not present

## 2023-08-06 DIAGNOSIS — A419 Sepsis, unspecified organism: Secondary | ICD-10-CM | POA: Diagnosis not present

## 2023-08-06 DIAGNOSIS — R569 Unspecified convulsions: Secondary | ICD-10-CM | POA: Diagnosis not present

## 2023-08-06 DIAGNOSIS — J9601 Acute respiratory failure with hypoxia: Secondary | ICD-10-CM | POA: Diagnosis not present

## 2023-08-06 DIAGNOSIS — J189 Pneumonia, unspecified organism: Secondary | ICD-10-CM | POA: Diagnosis not present

## 2023-08-06 DIAGNOSIS — J101 Influenza due to other identified influenza virus with other respiratory manifestations: Secondary | ICD-10-CM | POA: Diagnosis not present

## 2023-08-06 LAB — BASIC METABOLIC PANEL
Anion gap: 7 (ref 5–15)
BUN: 19 mg/dL (ref 8–23)
CO2: 30 mmol/L (ref 22–32)
Calcium: 9.1 mg/dL (ref 8.9–10.3)
Chloride: 102 mmol/L (ref 98–111)
Creatinine, Ser: 0.96 mg/dL (ref 0.61–1.24)
GFR, Estimated: >60 mL/min (ref >60)
Glucose, Bld: 82 mg/dL (ref 70–99)
Potassium: 3.6 mmol/L (ref 3.5–5.1)
Sodium: 139 mmol/L (ref 135–145)

## 2023-08-06 LAB — CBC
HCT: 35.7 % — ABNORMAL LOW (ref 39.0–52.0)
Hemoglobin: 11.8 g/dL — ABNORMAL LOW (ref 13.0–17.0)
MCH: 29.6 pg (ref 26.0–34.0)
MCHC: 33.1 g/dL (ref 30.0–36.0)
MCV: 89.7 fL (ref 80.0–100.0)
Platelets: 260 10*3/uL (ref 150–400)
RBC: 3.98 MIL/uL — ABNORMAL LOW (ref 4.22–5.81)
RDW: 14.5 % (ref 11.5–15.5)
WBC: 10.1 10*3/uL (ref 4.0–10.5)
nRBC: 0 % (ref 0.0–0.2)

## 2023-08-06 LAB — PROCALCITONIN: Procalcitonin: <0.1 ng/mL

## 2023-08-06 MED ORDER — NYSTATIN 100000 UNIT/ML MT SUSP
5.0000 mL | Freq: Four times a day (QID) | OROMUCOSAL | Status: DC
Start: 2023-08-06 — End: 2023-08-12
  Administered 2023-08-06 – 2023-08-12 (×27): 500000 [IU] via ORAL
  Filled 2023-08-06 (×26): qty 5

## 2023-08-06 NOTE — Progress Notes (Signed)
 Palliative Care Progress Note, Assessment & Plan   Patient Name: Jeremiah Nguyen       Date: 08/06/2023 DOB: 1956/01/01  Age: 68 y.o. MRN#: [id number] Attending Physician: Alford Highland, MD Primary Care Physician: Armando Gang, FNP Admit Date: 07/12/2023  Subjective: Patient is lying in bed with high flow nasal cannula in place.  Respirations are even and unlabored.  No family or friends present during my visit.  HPI: 68 y.o. male  with past medical history of COPD, current smoker, alcohol dependence, schizophrenia and seizure disorder admitted from group home on 07/12/2023 with altered mental status.   Found to be influenza A positive with pneumonia and septic shock-required mechanical ventilation and vasopressor support, successfully extubated 2/24, has remained encephalopathic, slow weaning of supplemental oxygen Has completed course of antibiotic therapy. Awaiting insurance approval after peer to peer for LTACH.    Palliative medicine was consulted for assisting with goals of care conversations  Summary of counseling/coordination of care: Extensive chart review completed prior to meeting patient including labs, vital signs, imaging, progress notes, orders, and available advanced directive documents from current and previous encounters.   After reviewing the patient's chart and assessing the patient at bedside, I spoke with patient in regards to symptom management and goals of care.  He awakens to my presence but quickly returns back to rest/with his eyes closed.  After visiting with the patient, I spoke with his Sister [name] over the phone.  She shares she spoke with other family members who did not give their opinions on boundaries or goals of care for patient.  She shares 1 family member  responded but that she does not have the majority of siblings input.  I attempted to elicit values and goals important to patient and [name].  [name] shares that she believes he should receive resuscitation and accept all appropriate medical interventions to sustain his life.  She shares that if the patient does suffer an arrest then they would want him placed on ventilatory support so that they could all come to the hospital.  She believes if this happens the family would then come together to make a decision for the patient.  Full code and full scope remain.  Symptom burden is low.  Awaiting insurance approval for LTAC and TOC following closely for disposition planning.  Goals are clear.  No acute palliative needs.  PMT will step back from daily visits and monitor the patient peripherally.  Please reengage with PMT if goals change, at patient/family's request, or if patient's health deteriorates during hospitalization.  [name] has PMT contact info and was encouraged to reach out to PMT with any acute palliative needs during this hospitalization.  Physical Exam Vitals reviewed.  Constitutional:      General: He is not in acute distress.    Appearance: He is normal weight.  HENT:     Head: Normocephalic.     Mouth/Throat:     Mouth: Mucous membranes are moist.  Eyes:     Pupils: Pupils are equal, round, and reactive to light.  Cardiovascular:     Pulses: Normal pulses.  Pulmonary:     Effort: Pulmonary effort is normal.     Comments:  HFNC in place Abdominal:     Palpations: Abdomen is soft.  Skin:    General: Skin is warm and dry.  Psychiatric:        Behavior: Behavior normal.        Judgment: Judgment normal.             Total Time 35 minutes   Time spent includes: Detailed review of medical records (labs, imaging, vital signs), medically appropriate exam (mental status, respiratory, cardiac, skin), discussed with treatment team, counseling and educating patient, family and  staff, documenting clinical information, medication management and coordination of care.  Samara Deist L. Bonita Quin, DNP, FNP-BC Palliative Medicine Team

## 2023-08-06 NOTE — Progress Notes (Signed)
 Progress Note   [name] MWU:[id number] DOB: 13-Dec-1955 DOA: 07/12/2023     25 DOS: the patient was seen and examined on 08/06/2023   Brief hospital course: 68 year old male with a past medical history significant for COPD, alcohol dependence, and seizure disorder who presented to [facility] ED on 07/12/2023 from his group home due to altered mental status.  Per report he has been altered over the last 24 hours.   On arrival to the ED he remained somnolent and altered, oriented only to self.  Given his respiratory distress he was placed on BiPAP and eventually required intubation and levophed.  Positive for influenza A. Started on tamiflu.    07/15/23- patient passed SBT today.  He is liberated from MV on to [facility]. Remains with encephalopathy.  07/16/23- patient is improved overnight. He is on home psychiatric medications now.  3/8 -- weaned to 10 L/min HFNC O2 3/9 -- O2 sats decompensated early AM, transferred to stepdown, back on heated HF o2 at 70-80%FiO2 - resume IV steroids.  Negative RVP, normal BMP, normal LA, CXR neg 3/10 -- remains on heated HF O2 50 L/min 65% FiO2.  Negative CTA chest for PE, but showed new RLL infiltrate, possible and started on Unasyn 3/11 -- 40 L/min 60-65% fiO2 3/12.  Patient on heated high flow nasal cannula 49% oxygen this morning with 40 L flow.  Pulmonary reconsulted. 3/13.  Patient still on 47% oxygen and 40 L flow on heated high flow nasal cannula.  Patient had a nosebleed this morning requiring me to stop aspirin and Lovenox injections and give Afrin nasal spray. 3/14.  No further nosebleed.  Awaiting insurance authorization for LTAC.  Still on heated high flow nasal cannula around 51% oxygen and 40 L flow as of this morning. 3/15.  Patient down to 34% oxygen this am heated high flow nasal cannula when I saw him this morning on 40 L flow. 3/16.  Patient was up to 60% oxygen on heated high flow nasal cannula 50 L flow and respiratory therapist was able  to get down to 45% oxygen 45 L flow. 3/17.  Patient was on 50% oxygen 45 L flow when I saw him today.  I had to do a peer to peer for LTAC approval.  Repeat chest x-ray still showing infiltrates.  Will repeat MRSA PCR which is negative.  Repeat CRP trended down to 3.0.  Procalcitonin negative x 2. 3/18.  Patient accepted to LTAC but LTAC does not have a bed today.  This morning the patient was on heated high flow nasal cannula 43% oxygen and 47% liter flow but looks like this afternoon and is on 50% oxygen 50 L flow.   Assessment and Plan: * Acute respiratory failure with hypoxia and hypercapnia (HCC) Patient on heated high flow nasal cannula 43% oxygen this morning with 47 L flow.  Appreciate pulmonary consultation.  Patient intubated during the hospital course.  Added inhalers on 3/12.  Will switch Solu-Medrol over to prednisone.  Incentive spirometry and MetaNeb treatments.  Awaiting insurance authorization for LTAC.  Repeat chest x-ray still showing infiltrates. (Patient completed Rocephin and Zithromax earlier in the hospital course and Unasyn recently). Cr trended better to 3 and procalcitonin negative x 2.  Multifocal pneumonia Repeat chest x-ray read as worsening infiltrates.  Patient had Rocephin and Zithromax earlier in the hospital course and recently Unasyn.  Repeat mrsa pcr is negative, procalcitonin negative x 2 and crp trended better to 3  Septic shock (HCC)  Present on admission and now has resolved.  Seizures (HCC) Continue Lamictal  Malnutrition of moderate degree Continue supplements  Gastroesophageal reflux disease PPI to twice daily.  Patient has dilated fluid-filled esophagus which can increase risk of of aspiration.  Patient does not complain of any nausea or vomiting.  Wondering if patient has achalasia.  Requiring too much oxygen for any endoscopy procedure at this point.  Patient's sister told me that he has had hiccups in the past and used to vomit in order to make  himself stop hiccuping.  Influenza A with pneumonia On admission  Epistaxis No recurrence.  Likely from heated high flow nasal cannula prolonged use.  Aspirin and Lovenox discontinued.  AKI (acute kidney injury) (HCC) Resolved.  Creatinine 2.0 on admission and creatinine 0.96 on last check.  Acute metabolic encephalopathy Secondary to septic shock        Subjective: Patient feels okay.  Offers no complaints.  Some slight cough.  Some shortness of breath.  Admitted 25 days ago with septic shock, acute respiratory failure.  Physical Exam: Vitals:   08/06/23 0838 08/06/23 0840 08/06/23 1122 08/06/23 1500  BP:   133/66   Pulse:   95   Resp:      Temp:   98.8 F (37.1 C)   TempSrc:      SpO2: (!) 88% 91% 90% 91%  Weight:      Height:       Physical Exam HENT:     Head: Normocephalic.     Mouth/Throat:     Pharynx: No oropharyngeal exudate.  Eyes:     General: Lids are normal.     Conjunctiva/sclera: Conjunctivae normal.  Cardiovascular:     Rate and Rhythm: Normal rate and regular rhythm.     Heart sounds: Normal heart sounds, S1 normal and S2 normal.  Pulmonary:     Breath sounds: Examination of the right-middle field reveals decreased breath sounds. Examination of the right-lower field reveals decreased breath sounds and rhonchi. Examination of the left-lower field reveals decreased breath sounds. Decreased breath sounds and rhonchi present. No wheezing or rales.  Abdominal:     Palpations: Abdomen is soft.     Tenderness: There is no abdominal tenderness.  Musculoskeletal:     Right lower leg: No swelling.     Left lower leg: No swelling.  Skin:    General: Skin is warm.     Findings: No rash.  Neurological:     Mental Status: He is alert.     Data Reviewed: Creatinine 0.96, procalcitonin negative x 2, CRP trended down to 3, hemoglobin 11.8, white blood count 10.1, platelet count 260  Family Communication: Spoke with Darl Pikes on the  phone  Disposition: Status is: Inpatient Remains inpatient appropriate because: Insurance approved LTAC facility but the LTAC does not have a bed.  Planned Discharge Destination: LTAC    Time spent: 27 minutes  Author: Alford Highland, MD 08/06/2023 4:50 PM  For on call review [email or web address].

## 2023-08-06 NOTE — Progress Notes (Signed)
 PULMONOLOGY         Date: 08/06/2023,   MRN# [id number] Jeremiah Nguyen [birth year]     AdmissionWeight: 68 kg                 CurrentWeight: 63.2 kg  Referring provider: Dr Renae Gloss   CHIEF COMPLAINT:   Acute on chronic hypoxemic respiratory failure   HISTORY OF PRESENT ILLNESS   This is a 68 yo M with hx of COPD, seizure disorder, liver disease, GERD who intially came in 06/2023 with AECOPD with influenza A infection.  He was found to be hypoxemic and altered with encephalopathy. He was treated for bacterial pneumonia and required ICU hospitalization. He developed septic shock and was intubated placed on MV and was on vasopressor support while in intensive care. He eventually improved and was weaned from MV.  At baseline he does have psychiatric disease and his home regimen was restarted.  He also developed component of metabolic encephalopathy presumably from renal impairment. Despite full scope of therapy he continues to require HFNC support and is on 50%/40L.  He does use IS but seems to be labored and at times regurgitates whittish phlegm. He had CT chest PE protocol performed 07/29/23 , I reviewed this independently and placed pictorial documentation below. PCCM consultation for further evaluation and management.    Patient seen at bedside. He is conversive and is lucid.  He is in good spirits wants to walk.  His nasal passages seem to be more obstructed then before with clotted debri.  I think we should place him in face mask Oxygen therapy to help [place] wean down his O2 requirement.    08/03/23- patient seen at bedside, he had large diarreah during my eval.  He was weaned down to 38%/40.  Reviewed with RT regarding weaning to venti mask or NRB with goal SPO2 >90%.  He seems improved clinically and is able to participate with metaneb and mucomyst  08/04/23- patient still on HFNC now on 45/45 but resting with spO2 >95% during examination.  He's receiving maximal medical therapy  with steroids, antibiotics, metaneb recruitment and mucomyst, despite this has had poor improvement.  Overall has not done well in the past year and has not walked in appx 1/2 year.  He remains full code and I think it we may need to lean towards palliative approach at this point. He is being seen by palliative care team.   08/05/23- patient is still with significant O2 req and minimal improvement over past 1 week.  He does follow instructions and reports feeling better but admits his baseline is very frail and weak. He relates being bedbound due to lower extermity weakness for past 6 months or so.  There is plan for [facility] admission due to expected need for longer therapy and recovery.  08/06/23- patient had large bowel foul smelling diarreah during my evaluation today.  He appears to be in mild distress.  He remains hypoxemic despite maximal medical management.  He would benefit from [facility] care due to difficulty weaning from HFNC and comorbid status.   PAST MEDICAL HISTORY   Past Medical History:  Diagnosis Date   COPD (chronic obstructive pulmonary disease) (HCC)    Eating disorder    GERD (gastroesophageal reflux disease)    Liver disease    Seizures (HCC)    Sleep apnea      SURGICAL HISTORY   Past Surgical History:  Procedure Laterality Date   COLONOSCOPY WITH PROPOFOL N/A 12/05/2020  Procedure: COLONOSCOPY WITH PROPOFOL;  Surgeon: Regis Bill, MD;  Location: [facility] ENDOSCOPY;  Service: Endoscopy;  Laterality: N/A;   ESOPHAGOGASTRODUODENOSCOPY (EGD) WITH PROPOFOL N/A 02/25/2018   Procedure: ESOPHAGOGASTRODUODENOSCOPY (EGD) WITH PROPOFOL;  Surgeon: Wyline Mood, MD;  Location: [facility] ENDOSCOPY;  Service: Gastroenterology;  Laterality: N/A;   LESION EXCISION Left 12-15-13   follicular cyst   SKIN BIOPSY       FAMILY HISTORY   Family History  Problem Relation Age of Onset   Cancer Mother        breast     SOCIAL HISTORY   Social History   Tobacco Use   Smoking status:  Every Day    Current packs/day: 0.50    Average packs/day: 0.5 packs/day for 30.0 years (15.0 ttl pk-yrs)    Types: Cigarettes   Smokeless tobacco: Never  Vaping Use   Vaping status: Never Used  Substance Use Topics   Alcohol use: No   Drug use: No     MEDICATIONS    Home Medication:    Current Medication:  Current Facility-Administered Medications:    acetaminophen (TYLENOL) tablet 650 mg, 650 mg, Oral, Q4H PRN, Mansy, Jan A, MD, 650 mg at 07/29/23 0865   benzonatate (TESSALON) capsule 100 mg, 100 mg, Oral, TID, Renae Gloss, Richard, MD, 100 mg at 08/05/23 2023   Chlorhexidine Gluconate Cloth 2 % PADS 6 each, 6 each, Topical, Daily, Pennie Banter, DO, 6 each at 08/05/23 1151   chlorproMAZINE (THORAZINE) tablet 50 mg, 50 mg, Oral, QID, Jamelle Rushing L, MD, 50 mg at 08/05/23 2023   feeding supplement (ENSURE ENLIVE / ENSURE PLUS) liquid 237 mL, 237 mL, Oral, TID BM, Tsugio Elison, MD, 237 mL at 08/05/23 2055   fluticasone furoate-vilanterol (BREO ELLIPTA) 100-25 MCG/ACT 1 puff, 1 puff, Inhalation, Daily, Wieting, Richard, MD, 1 puff at 08/05/23 1006   folic acid (FOLVITE) tablet 1 mg, 1 mg, Oral, Daily, Karna Christmas, Kyriakos Babler, MD, 1 mg at 08/05/23 1005   guaiFENesin (ROBITUSSIN) 100 MG/5ML liquid 5 mL, 5 mL, Oral, Q4H PRN, Renae Gloss, Richard, MD   ipratropium-albuterol (DUONEB) 0.5-2.5 (3) MG/3ML nebulizer solution 3 mL, 3 mL, Nebulization, Q4H PRN, Esaw Grandchild A, DO, 3 mL at 08/04/23 1955   lamoTRIgine (LAMICTAL) tablet 150 mg, 150 mg, Oral, BID, Rust-Chester, Britton L, NP, 150 mg at 08/05/23 2023   metoprolol succinate (TOPROL-XL) 24 hr tablet 25 mg, 25 mg, Oral, Daily, Esaw Grandchild A, DO, 25 mg at 08/05/23 1005   multivitamin with minerals tablet 1 tablet, 1 tablet, Oral, Daily, Karna Christmas, Raneem Mendolia, MD, 1 tablet at 08/05/23 1005   nicotine (NICODERM CQ - dosed in mg/24 hours) patch 14 mg, 14 mg, Transdermal, Daily, Ezequiel Essex, NP, 14 mg at 08/05/23 1004   ondansetron (ZOFRAN)  injection 4 mg, 4 mg, Intravenous, Q6H PRN, Esaw Grandchild A, DO, 4 mg at 07/31/23 2159   Oral care mouth rinse, 15 mL, Mouth Rinse, PRN, Esaw Grandchild A, DO   pantoprazole (PROTONIX) EC tablet 40 mg, 40 mg, Oral, BID, Wieting, Richard, MD, 40 mg at 08/05/23 2023   polyethylene glycol (MIRALAX / GLYCOLAX) packet 17 g, 17 g, Oral, BID, Wieting, Richard, MD, 17 g at 08/04/23 1057   predniSONE (DELTASONE) tablet 30 mg, 30 mg, Oral, Q breakfast, Wieting, Richard, MD   senna (SENOKOT) tablet 17.2 mg, 2 tablet, Oral, QHS, Wieting, Richard, MD, 17.2 mg at 08/05/23 2023   sertraline (ZOLOFT) tablet 25 mg, 25 mg, Oral, Daily, Karna Christmas, Clancy Leiner, MD, 25 mg at 08/05/23 1005  thiamine (VITAMIN B1) tablet 100 mg, 100 mg, Oral, Daily, Rust-Chester, Britton L, NP, 100 mg at 08/05/23 1006   traZODone (DESYREL) tablet 50 mg, 50 mg, Oral, QHS PRN, Rust-Chester, Britton L, NP, 50 mg at 07/30/23 2141   umeclidinium bromide (INCRUSE ELLIPTA) 62.5 MCG/ACT 1 puff, 1 puff, Inhalation, Daily, Renae Gloss, Richard, MD, 1 puff at 08/05/23 1006    ALLERGIES   Patient has no known allergies.     REVIEW OF SYSTEMS    Review of Systems:  Gen:  Denies  fever, sweats, chills weigh loss  HEENT: Denies blurred vision, double vision, ear pain, eye pain, hearing loss, nose bleeds, sore throat Cardiac:  No dizziness, chest pain or heaviness, chest tightness,edema Resp:   reports dyspnea chronically  Gi: Denies swallowing difficulty, stomach pain, nausea or vomiting, diarrhea, constipation, bowel incontinence Gu:  Denies bladder incontinence, burning urine Ext:   Denies Joint pain, stiffness or swelling Skin: Denies  skin rash, easy bruising or bleeding or hives Endoc:  Denies polyuria, polydipsia , polyphagia or weight change Psych:   Denies depression, insomnia or hallucinations   Other:  All other systems negative   VS: BP (!) 128/55 (BP Location: Right Arm)   Pulse 82   Temp 98.5 F (36.9 C)   Resp 19   Ht 5'  7.99" (1.727 m)   Wt 63.2 kg   SpO2 98%   BMI 21.19 kg/m      PHYSICAL EXAM    GENERAL:NAD, no fevers, chills, no weakness no fatigue HEAD: Normocephalic, atraumatic.  EYES: Pupils equal, round, reactive to light. Extraocular muscles intact. No scleral icterus.  MOUTH: Moist mucosal membrane. Dentition intact. No abscess noted.  EAR, NOSE, THROAT: Clear without exudates. No external lesions.  NECK: Supple. No thyromegaly. No nodules. No JVD.  PULMONARY: decreased breath sounds with mild rhonchi worse at bases bilaterally.  CARDIOVASCULAR: S1 and S2. Regular rate and rhythm. No murmurs, rubs, or gallops. No edema. Pedal pulses 2+ bilaterally.  GASTROINTESTINAL: Soft, nontender, nondistended. No masses. Positive bowel sounds. No hepatosplenomegaly.  MUSCULOSKELETAL: No swelling, clubbing, or edema. Range of motion full in all extremities.  NEUROLOGIC: Cranial nerves II through XII are intact. No gross focal neurological deficits. Sensation intact. Reflexes intact.  SKIN: No ulceration, lesions, rashes, or cyanosis. Skin warm and dry. Turgor intact.  PSYCHIATRIC: Mood, affect within normal limits. The patient is awake, alert and oriented x 3. Insight, judgment intact.       IMAGING     CT CHEST WITH BILATERAL BRONCHITIC CHANGES, ANTERIORLY ON RIGHT THERE IS A LARGE BULLA AT RIGHT LOWER LOBE, POSTERIORLY THERE IS ATELECTASIS AT LEFT LOWER LOBE.    ASSESSMENT/PLAN   Acute on chronic hypoxemic respiratory failure     - noted residual bronchitic changes - agree with current solumedrol       - completed unasyn course       -continue COPD care path with neb treatments       -continue mucomyst 20% - 4ml BID  COPD-chronic with exacerbation post influenza -continue solumedrol 40mg - can wean to prednisone with taper -BNP is normal unlikely overlap of CHF -continue current antimicrobials   Atelectasis -RESOLVED  - worse at [facility]  Starting metaneb with albuterol q8h x 3  d   Tobacco dependence   Continue nicotine replacement   OSA - CPAP at bedtime with setting by RT           Thank you for allowing me to participate in the care of this  patient.   Patient/Family are satisfied with care plan and all questions have been answered.    Provider disclosure: Patient with at least one acute or chronic illness or injury that poses a threat to life or bodily function and is being managed actively during this encounter.  All of the below services have been performed independently by signing provider:  review of prior documentation from internal and or external health records.  Review of previous and current lab results.  Interview and comprehensive assessment during patient visit today. Review of current and previous chest radiographs/CT scans. Discussion of management and test interpretation with health care team and patient/family.   This document was prepared using Dragon voice recognition software and may include unintentional dictation errors.     Vida Rigger, M.D.  Division of Pulmonary & Critical Care Medicine

## 2023-08-06 NOTE — TOC Progression Note (Signed)
 Transition of Care [facility]) - Progression Note    Patient Details  Name: Jeremiah Nguyen MRN: [id number] Date of Birth: Oct 18, 1955  [facility]) CM/SW Contact  Margarito Liner, LCSW Phone Number: 08/06/2023, 2:37 PM  Clinical Narrative:   Per [facility] liaison, insurance authorization is approved but no bed today.  Expected Discharge Plan and Services                                               Social Determinants of Health (SDOH) Interventions SDOH Screenings   Food Insecurity: Patient Unable To Answer (07/13/2023)  Housing: Patient Unable To Answer (07/13/2023)  Transportation Needs: Patient Unable To Answer (07/13/2023)  Utilities: Patient Unable To Answer (07/13/2023)  Depression (PHQ2-9): Low Risk  (02/25/2019)  Financial Resource Strain: Low Risk  (06/30/2018)  Physical Activity: Inactive (06/30/2018)  Social Connections: Patient Unable To Answer (07/13/2023)  Stress: Stress Concern Present (06/30/2018)  Tobacco Use: High Risk (07/12/2023)    Readmission Risk Interventions     No data to display

## 2023-08-06 NOTE — Progress Notes (Signed)
 Physical Therapy Treatment Patient Details Name: Jeremiah Nguyen MRN: [id number] DOB: Apr 21, 1956 Today's Date: 08/06/2023   History of Present Illness Patient is a 68 year old male with history of COPD, alcohol dependence, and seizure disorder who presented on 07/12/2023 from group home due to altered mental status. Positive for influenza A, required mechanical ventilation, now on HHFNC.    PT Comments  Patient is agreeable to PT session. Increased independence with bed mobility today. He still requires assistance for standing and only able to achieve partial standing position. Cues for anterior weight shifting to facilitate independence with transfers. Activity tolerance limited by fatigue. Sp02 97% after standing. Recommend to continue PT to maximize independence and facilitate return to prior level of function. Anticipate patient will need continued rehabilitation < 3 hours/day after this hospital stay.    If plan is discharge home, recommend the following: Two people to help with walking and/or transfers;A lot of help with bathing/dressing/bathroom;Assist for transportation;Help with stairs or ramp for entrance;Supervision due to cognitive status;Assistance with cooking/housework;Assistance with feeding;Direct supervision/assist for medications management;Direct supervision/assist for financial management   Can travel by private vehicle     No  Equipment Recommendations  None recommended by PT    Recommendations for Other Services       Precautions / Restrictions Precautions Precautions: Fall Restrictions Weight Bearing Restrictions Per Provider Order: No     Mobility  Bed Mobility Overal bed mobility: Needs Assistance Bed Mobility: Supine to Sit, Sit to Supine     Supine to sit: Min assist Sit to supine: Min assist   General bed mobility comments: assistance for trunk support to sit upright. assistance for LE support to return to bed. cues for technique     Transfers Overall transfer level: Needs assistance Equipment used: 1 person hand held assist Transfers: Sit to/from Stand Sit to Stand: Max assist           General transfer comment: lifting and lowering assistance provided. cues for anterior weight shifting to faciliate lift off    Ambulation/Gait               General Gait Details: unable to at this time   Stairs             Wheelchair Mobility     Tilt Bed    Modified Rankin (Stroke Patients Only)       Balance Overall balance assessment: Needs assistance Sitting-balance support: Feet supported Sitting balance-Leahy Scale: Good     Standing balance support: Single extremity supported Standing balance-Leahy Scale: Poor Standing balance comment: external support required to maintain partial standing balance with flexed trunk                            Communication Communication Communication: Impaired Factors Affecting Communication: Difficulty expressing self  Cognition Arousal: Alert Behavior During Therapy: Flat affect   PT - Cognitive impairments: No apparent impairments, Memory, Sequencing                         Following commands: Impaired Following commands impaired: Follows one step commands with increased time    Cueing Cueing Techniques: Verbal cues, Gestural cues, Tactile cues  Exercises      General Comments General comments (skin integrity, edema, etc.): patient is fatigued with minimal activity and requesting to return to bed after standing once. Sp02 97% after standing on 50L HHFNC      Pertinent  Vitals/Pain Pain Assessment Pain Assessment: No/denies pain    Home Living                          Prior Function            PT Goals (current goals can now be found in the care plan section) Acute Rehab PT Goals Patient Stated Goal: none stated PT Goal Formulation: With patient Time For Goal Achievement: 08/14/23 Potential to  Achieve Goals: Fair Progress towards PT goals: Progressing toward goals    Frequency    Min 2X/week      PT Plan      Co-evaluation              AM-PAC PT "6 Clicks" Mobility   Outcome Measure  Help needed turning from your back to your side while in a flat bed without using bedrails?: A Lot Help needed moving from lying on your back to sitting on the side of a flat bed without using bedrails?: A Lot Help needed moving to and from a bed to a chair (including a wheelchair)?: A Lot Help needed standing up from a chair using your arms (e.g., wheelchair or bedside chair)?: A Lot Help needed to walk in hospital room?: Total Help needed climbing 3-5 steps with a railing? : Total 6 Click Score: 10    End of Session Equipment Utilized During Treatment: Oxygen Activity Tolerance: Patient limited by fatigue Patient left: in bed;with call bell/phone within reach;with bed alarm set Nurse Communication: Mobility status PT Visit Diagnosis: Muscle weakness (generalized) (M62.81);Unsteadiness on feet (R26.81)     Time: 8469-6295 PT Time Calculation (min) (ACUTE ONLY): 13 min  Charges:    $Therapeutic Activity: 8-22 mins PT General Charges $$ ACUTE PT VISIT: 1 Visit                     Jeremiah Nguyen, PT, MPT    Jeremiah Nguyen 08/06/2023, 3:04 PM

## 2023-08-06 NOTE — Plan of Care (Signed)
  Problem: Activity: Goal: Ability to tolerate increased activity will improve Outcome: Progressing   Problem: Respiratory: Goal: Ability to maintain a clear airway and adequate ventilation will improve Outcome: Progressing   Problem: Role Relationship: Goal: Method of communication will improve Outcome: Progressing   Problem: Education: Goal: Knowledge of General Education information will improve Description: Including pain rating scale, medication(s)/side effects and non-pharmacologic comfort measures Outcome: Progressing   Problem: Health Behavior/Discharge Planning: Goal: Ability to manage health-related needs will improve Outcome: Progressing   Problem: Clinical Measurements: Goal: Ability to maintain clinical measurements within normal limits will improve Outcome: Progressing Goal: Will remain free from infection Outcome: Progressing Goal: Diagnostic test results will improve Outcome: Progressing Goal: Respiratory complications will improve Outcome: Progressing Goal: Cardiovascular complication will be avoided Outcome: Progressing   Problem: Activity: Goal: Risk for activity intolerance will decrease Outcome: Progressing   Problem: Nutrition: Goal: Adequate nutrition will be maintained Outcome: Progressing   Problem: Coping: Goal: Level of anxiety will decrease Outcome: Progressing   Problem: Elimination: Goal: Will not experience complications related to bowel motility Outcome: Progressing Goal: Will not experience complications related to urinary retention Outcome: Progressing   Problem: Pain Managment: Goal: General experience of comfort will improve and/or be controlled Outcome: Progressing   Problem: Safety: Goal: Ability to remain free from injury will improve Outcome: Progressing   Problem: Skin Integrity: Goal: Risk for impaired skin integrity will decrease Outcome: Progressing   Problem: Education: Goal: Ability to describe self-care  measures that may prevent or decrease complications (Diabetes Survival Skills Education) will improve Outcome: Progressing Goal: Individualized Educational Video(s) Outcome: Progressing   Problem: Coping: Goal: Ability to adjust to condition or change in health will improve Outcome: Progressing   Problem: Fluid Volume: Goal: Ability to maintain a balanced intake and output will improve Outcome: Progressing   Problem: Health Behavior/Discharge Planning: Goal: Ability to identify and utilize available resources and services will improve Outcome: Progressing Goal: Ability to manage health-related needs will improve Outcome: Progressing   Problem: Metabolic: Goal: Ability to maintain appropriate glucose levels will improve Outcome: Progressing   Problem: Nutritional: Goal: Maintenance of adequate nutrition will improve Outcome: Progressing Goal: Progress toward achieving an optimal weight will improve Outcome: Progressing   Problem: Skin Integrity: Goal: Risk for impaired skin integrity will decrease Outcome: Progressing   Problem: Tissue Perfusion: Goal: Adequacy of tissue perfusion will improve Outcome: Progressing

## 2023-08-07 DIAGNOSIS — J9602 Acute respiratory failure with hypercapnia: Secondary | ICD-10-CM | POA: Diagnosis not present

## 2023-08-07 DIAGNOSIS — J9601 Acute respiratory failure with hypoxia: Secondary | ICD-10-CM | POA: Diagnosis not present

## 2023-08-07 NOTE — Progress Notes (Signed)
 Occupational Therapy Treatment Patient Details Name: Jeremiah Nguyen MRN: [id number] DOB: 1956/05/16 Today's Date: 08/07/2023   History of present illness Patient is a 68 year old male with history of COPD, alcohol dependence, and seizure disorder who presented on 07/12/2023 from group home due to altered mental status. Positive for influenza A, required mechanical ventilation, now on HHFNC.   OT comments  Pt is supine in bed on arrival. Family present and pt pleasant and agreeable to OT session. He denies pain. Pt progressed to performing bed mobility with CGA/SUP today with increased time/effort. On 48L HHFNC with lowest sp02 of 93%. STS from EOB x2 trials to RW with Min A/CGA and able to perform dynamic standing activities with constant CGA and x1 posterior LOB requiring Min A to self correct. He continues to fatigue easily with all activity and endorse weakness.  Pt returned to bed with all needs in place and will cont to require skilled acute OT services to maximize his safety and IND to return to PLOF.       If plan is discharge home, recommend the following:  Help with stairs or ramp for entrance;Supervision due to cognitive status;A lot of help with bathing/dressing/bathroom;A lot of help with walking and/or transfers   Equipment Recommendations  Other (comment) (defer)    Recommendations for Other Services      Precautions / Restrictions Precautions Precautions: Fall Recall of Precautions/Restrictions: Impaired Precaution/Restrictions Comments: monitor Sp02 Restrictions Weight Bearing Restrictions Per Provider Order: No       Mobility Bed Mobility Overal bed mobility: Needs Assistance Bed Mobility: Supine to Sit, Sit to Supine     Supine to sit: Supervision Sit to supine: Supervision   General bed mobility comments: SUP for bed mobility this afternoon no physical assist    Transfers Overall transfer level: Needs assistance Equipment used: Rolling walker (2  wheels) Transfers: Sit to/from Stand Sit to Stand: Contact guard assist, Min assist           General transfer comment: Min/CGA for STS from EOB to RW x2 trials; standing marching at EOB and lateral steps     Balance Overall balance assessment: Needs assistance Sitting-balance support: Feet supported Sitting balance-Leahy Scale: Good     Standing balance support: Single extremity supported Standing balance-Leahy Scale: Poor Standing balance comment: RW support and CGA                           ADL either performed or assessed with clinical judgement   ADL                                              Extremity/Trunk Assessment              Vision       Perception     Praxis     Communication Communication Communication: Impaired Factors Affecting Communication: Difficulty expressing self   Cognition Arousal: Alert Behavior During Therapy: Flat affect                                 Following commands: Impaired Following commands impaired: Follows one step commands with increased time      Cueing   Cueing Techniques: Verbal cues, Gestural cues, Tactile cues  Exercises Other Exercises Other  Exercises: Standing exercises at RW at EOB performed to maximize strength/endurance.    Shoulder Instructions       General Comments HHFNC 48-50L and lowest reading of 93%    Pertinent Vitals/ Pain       Pain Assessment Pain Assessment: No/denies pain Pain Intervention(s): Monitored during session  Home Living                                          Prior Functioning/Environment              Frequency  Min 2X/week        Progress Toward Goals  OT Goals(current goals can now be found in the care plan section)  Progress towards OT goals: Progressing toward goals  Acute Rehab OT Goals Patient Stated Goal: get better OT Goal Formulation: With patient Time For Goal Achievement:  08/16/23 Potential to Achieve Goals: Fair  Plan      Co-evaluation                 AM-PAC OT "6 Clicks" Daily Activity     Outcome Measure   Help from another person eating meals?: A Little Help from another person taking care of personal grooming?: A Little Help from another person toileting, which includes using toliet, bedpan, or urinal?: A Lot Help from another person bathing (including washing, rinsing, drying)?: A Lot Help from another person to put on and taking off regular upper body clothing?: A Little Help from another person to put on and taking off regular lower body clothing?: A Lot 6 Click Score: 15    End of Session Equipment Utilized During Treatment: Oxygen (48L HHFNC)  OT Visit Diagnosis: Other abnormalities of gait and mobility (R26.89);Muscle weakness (generalized) (M62.81)   Activity Tolerance Patient tolerated treatment well   Patient Left in bed;with call bell/phone within reach;with bed alarm set;Other (comment)   Nurse Communication Mobility status        Time: 1610-9604 OT Time Calculation (min): 24 min  Charges: OT General Charges $OT Visit: 1 Visit OT Treatments $Therapeutic Activity: 23-37 mins  Dezeray Puccio, OTR/L  08/07/23, 4:18 PM   Boone Gear E Deshane Cotroneo 08/07/2023, 4:16 PM

## 2023-08-07 NOTE — TOC Progression Note (Signed)
 Transition of Care [facility]) - Progression Note    Patient Details  Name: Jeremiah Nguyen MRN: [id number] Date of Birth: [birth year]  [facility]) CM/SW Contact  Truddie Hidden, RN Phone Number: 08/07/2023, 11:01 AM  Clinical Narrative:    Per Consuello Closs at [facility], patient has been approved for [facility] via 3/31. There is no bed availability at this time. MD has been made aware.          Expected Discharge Plan and Services                                               Social Determinants of Health (SDOH) Interventions SDOH Screenings   Food Insecurity: Patient Unable To Answer (07/13/2023)  Housing: Patient Unable To Answer (07/13/2023)  Transportation Needs: Patient Unable To Answer (07/13/2023)  Utilities: Patient Unable To Answer (07/13/2023)  Depression (PHQ2-9): Low Risk  (02/25/2019)  Financial Resource Strain: Low Risk  (06/30/2018)  Physical Activity: Inactive (06/30/2018)  Social Connections: Patient Unable To Answer (07/13/2023)  Stress: Stress Concern Present (06/30/2018)  Tobacco Use: High Risk (07/12/2023)    Readmission Risk Interventions     No data to display

## 2023-08-07 NOTE — Progress Notes (Signed)
 Nutrition Follow-up  DOCUMENTATION CODES:   Non-severe (moderate) malnutrition in context of chronic illness  INTERVENTION:   -Continue regular diet -Continue Ensure Enlive po TID, each supplement provides 350 kcal and 20 grams of protein -Continue Magic cup TID with meals, each supplement provides 290 kcal and 9 grams of protein  -Continue MVI with minerals daily -Continue 1 mg folic acid daily -Continue 644 mg thiamine daily -RD will sign off due to medical stability; if further nutrition-related issues persists, please re-consult RD   NUTRITION DIAGNOSIS:   Moderate Malnutrition related to chronic illness as evidenced by severe fat depletion, severe muscle depletion.  Ongoing  GOAL:   Patient will meet greater than or equal to 90% of their needs  Progressing   MONITOR:   PO intake, Supplement acceptance, Labs, Weight trends, I & O's, Skin  REASON FOR ASSESSMENT:   Ventilator    ASSESSMENT:   68 y/o male with h/o seizures, etoh abuse, GERD, liver disease, OSA, COPD and resides in a group home who is admitted with acute metabolic encephalopathy, severe sepsis with septic shock, AKI, acute hypoxic and hypercapnic respiratory failure in the setting of acute COPD exacerbation due to influenza A infection and superimposed bacterial pneumonia requiring intubation and mechanical ventilation.  Reviewed I/O's: -1.3 L x 24 hours and -20.7 L since 07/24/23  UOP: 1.4 L x 24 hours   Pt remains on a regular diet with good oral intake. Noted meal completions 50-100%. Pt also drinking Ensure supplements.   Wt has been stable over the past week.   Per palliative care notes, plan for full scope care.   Per TOC notes, pt has insurance authorization for [facility]. Pt awaiting bed availability.   Medications reviewed and include folic acid, protonix, miralax, and thiamine.   Labs reviewed: CBGS: 134 (inpatient orders for glycemic control are none).    Diet Order:   Diet Order              Diet regular Room service appropriate? Yes; Fluid consistency: Thin  Diet effective now                   EDUCATION NEEDS:   No education needs have been identified at this time  Skin:  Skin Assessment: Skin Integrity Issues: Skin Integrity Issues:: Other (Comment) Other: skin tear to mid anus  Last BM:  08/06/23 (type 5)  Height:   Ht Readings from Last 1 Encounters:  07/12/23 5' 7.99" (1.727 m)    Weight:   Wt Readings from Last 1 Encounters:  08/07/23 64.3 kg    Ideal Body Weight:  70 kg  BMI:  Body mass index is 21.56 kg/m.  Estimated Nutritional Needs:   Kcal:  1900-2200kcal/day  Protein:  95-110g/day  Fluid:  1.8-2.1L/day    Levada Schilling, RD, LDN, CDCES Registered Dietitian III Certified Diabetes Care and Education Specialist If unable to reach this RD, please use "RD Inpatient" group chat on secure chat between hours of 8am-4 pm daily

## 2023-08-07 NOTE — Progress Notes (Signed)
 Mobility Specialist - Progress Note  Pre-mobility: HR 101, SpO2 90% During mobility: HR 98, SpO2 88% Post-mobility: HR 99, SPO2 89%   08/07/23 1146  Mobility  Activity Stood at bedside  Level of Assistance Contact guard assist, steadying assist (+2 for safety)  Assistive Device Front wheel walker  Range of Motion/Exercises Active;Right leg;Left leg  Activity Response Tolerated well  Mobility visit 1 Mobility  Mobility Specialist Start Time (ACUTE ONLY) 1030  Mobility Specialist Stop Time (ACUTE ONLY) 1041  Mobility Specialist Time Calculation (min) (ACUTE ONLY) 11 min   Pt supine upon entry, utilizing [place]. Pt initial hesitant to participate in activity, however ultimately agrees to OOB activity this date. Pt completed bed mob indep and dangled EOB for ~ 2 mins--- desat to 88%, asymptomatic. Pt STS to RW x2, CGA + 2 for the first bout d/t improper hand placement when standing, CGA of one for the second bout-- stood at bedside for ~1 min each bout. Pt completed a few standing marches and took 2 steps backwards toward the bed before returning EOB. Pt left supine with alarm set and needs within reach.  Zetta Bills Mobility Specialist 08/07/23 11:55 AM

## 2023-08-07 NOTE — Plan of Care (Signed)
  Problem: Activity: Goal: Ability to tolerate increased activity will improve Outcome: Progressing   Problem: Respiratory: Goal: Ability to maintain a clear airway and adequate ventilation will improve Outcome: Progressing   Problem: Role Relationship: Goal: Method of communication will improve Outcome: Progressing   Problem: Education: Goal: Knowledge of General Education information will improve Description: Including pain rating scale, medication(s)/side effects and non-pharmacologic comfort measures Outcome: Progressing   Problem: Health Behavior/Discharge Planning: Goal: Ability to manage health-related needs will improve Outcome: Progressing   Problem: Clinical Measurements: Goal: Ability to maintain clinical measurements within normal limits will improve Outcome: Progressing Goal: Will remain free from infection Outcome: Progressing Goal: Diagnostic test results will improve Outcome: Progressing Goal: Respiratory complications will improve Outcome: Progressing Goal: Cardiovascular complication will be avoided Outcome: Progressing   Problem: Activity: Goal: Risk for activity intolerance will decrease Outcome: Progressing   Problem: Nutrition: Goal: Adequate nutrition will be maintained Outcome: Progressing   Problem: Coping: Goal: Level of anxiety will decrease Outcome: Progressing   Problem: Elimination: Goal: Will not experience complications related to bowel motility Outcome: Progressing Goal: Will not experience complications related to urinary retention Outcome: Progressing   Problem: Pain Managment: Goal: General experience of comfort will improve and/or be controlled Outcome: Progressing   Problem: Safety: Goal: Ability to remain free from injury will improve Outcome: Progressing   Problem: Skin Integrity: Goal: Risk for impaired skin integrity will decrease Outcome: Progressing   Problem: Education: Goal: Ability to describe self-care  measures that may prevent or decrease complications (Diabetes Survival Skills Education) will improve Outcome: Progressing Goal: Individualized Educational Video(s) Outcome: Progressing   Problem: Coping: Goal: Ability to adjust to condition or change in health will improve Outcome: Progressing   Problem: Fluid Volume: Goal: Ability to maintain a balanced intake and output will improve Outcome: Progressing   Problem: Health Behavior/Discharge Planning: Goal: Ability to identify and utilize available resources and services will improve Outcome: Progressing Goal: Ability to manage health-related needs will improve Outcome: Progressing   Problem: Metabolic: Goal: Ability to maintain appropriate glucose levels will improve Outcome: Progressing   Problem: Nutritional: Goal: Maintenance of adequate nutrition will improve Outcome: Progressing Goal: Progress toward achieving an optimal weight will improve Outcome: Progressing   Problem: Skin Integrity: Goal: Risk for impaired skin integrity will decrease Outcome: Progressing   Problem: Tissue Perfusion: Goal: Adequacy of tissue perfusion will improve Outcome: Progressing

## 2023-08-07 NOTE — Progress Notes (Signed)
 PULMONOLOGY         Date: 08/07/2023,   MRN# [id number] Jeremiah Nguyen Dec 04, 1955     AdmissionWeight: 68 kg                 CurrentWeight: 64.3 kg  Referring provider: Dr Renae Gloss   CHIEF COMPLAINT:   Acute on chronic hypoxemic respiratory failure   HISTORY OF PRESENT ILLNESS   This is a 68 yo M with hx of COPD, seizure disorder, liver disease, GERD who intially came in 06/2023 with AECOPD with influenza A infection.  He was found to be hypoxemic and altered with encephalopathy. He was treated for bacterial pneumonia and required ICU hospitalization. He developed septic shock and was intubated placed on MV and was on vasopressor support while in intensive care. He eventually improved and was weaned from MV.  At baseline he does have psychiatric disease and his home regimen was restarted.  He also developed component of metabolic encephalopathy presumably from renal impairment. Despite full scope of therapy he continues to require HFNC support and is on 50%/40L.  He does use IS but seems to be labored and at times regurgitates whittish phlegm. He had CT chest PE protocol performed 07/29/23 , I reviewed this independently and placed pictorial documentation below. PCCM consultation for further evaluation and management.    Patient seen at bedside. He is conversive and is lucid.  He is in good spirits wants to walk.  His nasal passages seem to be more obstructed then before with clotted debri.  I think we should place him in face mask Oxygen therapy to help [place] wean down his O2 requirement.    08/03/23- patient seen at bedside, he had large diarreah during my eval.  He was weaned down to 38%/40.  Reviewed with RT regarding weaning to venti mask or NRB with goal SPO2 >90%.  He seems improved clinically and is able to participate with metaneb and mucomyst  08/04/23- patient still on HFNC now on 45/45 but resting with spO2 >95% during examination.  He's receiving maximal medical therapy  with steroids, antibiotics, metaneb recruitment and mucomyst, despite this has had poor improvement.  Overall has not done well in the past year and has not walked in appx 1/2 year.  He remains full code and I think it we may need to lean towards palliative approach at this point. He is being seen by palliative care team.   08/05/23- patient is still with significant O2 req and minimal improvement over past 1 week.  He does follow instructions and reports feeling better but admits his baseline is very frail and weak. He relates being bedbound due to lower extermity weakness for past 6 months or so.  There is plan for [facility] admission due to expected need for longer therapy and recovery.  08/06/23- patient had large bowel foul smelling diarreah during my evaluation today.  He appears to be in mild distress.  He remains hypoxemic despite maximal medical management.  He would benefit from [facility] care due to difficulty weaning from HFNC and comorbid status.   08/07/23- patient on hFNC 50/50.  There is workup in process with insurance regarding LTACH placement.  Patient remains on maximal medical management.  Prednisone at 30mg  daily.  He has  Breo and incruse ellipta for COPD   PAST MEDICAL HISTORY   Past Medical History:  Diagnosis Date   COPD (chronic obstructive pulmonary disease) (HCC)    Eating disorder    GERD (gastroesophageal  reflux disease)    Liver disease    Seizures (HCC)    Sleep apnea      SURGICAL HISTORY   Past Surgical History:  Procedure Laterality Date   COLONOSCOPY WITH PROPOFOL N/A 12/05/2020   Procedure: COLONOSCOPY WITH PROPOFOL;  Surgeon: Regis Bill, MD;  Location: [facility] ENDOSCOPY;  Service: Endoscopy;  Laterality: N/A;   ESOPHAGOGASTRODUODENOSCOPY (EGD) WITH PROPOFOL N/A 02/25/2018   Procedure: ESOPHAGOGASTRODUODENOSCOPY (EGD) WITH PROPOFOL;  Surgeon: Wyline Mood, MD;  Location: [facility] ENDOSCOPY;  Service: Gastroenterology;  Laterality: N/A;   LESION EXCISION Left  12-15-13   follicular cyst   SKIN BIOPSY       FAMILY HISTORY   Family History  Problem Relation Age of Onset   Cancer Mother        breast     SOCIAL HISTORY   Social History   Tobacco Use   Smoking status: Every Day    Current packs/day: 0.50    Average packs/day: 0.5 packs/day for 30.0 years (15.0 ttl pk-yrs)    Types: Cigarettes   Smokeless tobacco: Never  Vaping Use   Vaping status: Never Used  Substance Use Topics   Alcohol use: No   Drug use: No     MEDICATIONS    Home Medication:    Current Medication:  Current Facility-Administered Medications:    acetaminophen (TYLENOL) tablet 650 mg, 650 mg, Oral, Q4H PRN, Mansy, Jan A, MD, 650 mg at 07/29/23 1610   benzonatate (TESSALON) capsule 100 mg, 100 mg, Oral, TID, Renae Gloss, Richard, MD, 100 mg at 08/06/23 2136   Chlorhexidine Gluconate Cloth 2 % PADS 6 each, 6 each, Topical, Daily, Pennie Banter, DO, 6 each at 08/06/23 9604   chlorproMAZINE (THORAZINE) tablet 50 mg, 50 mg, Oral, QID, Leeroy Bock, MD, 50 mg at 08/06/23 2135   feeding supplement (ENSURE ENLIVE / ENSURE PLUS) liquid 237 mL, 237 mL, Oral, TID BM, Raylyn Speckman, MD, 237 mL at 08/06/23 2055   fluticasone furoate-vilanterol (BREO ELLIPTA) 100-25 MCG/ACT 1 puff, 1 puff, Inhalation, Daily, Wieting, Richard, MD, 1 puff at 08/06/23 0830   folic acid (FOLVITE) tablet 1 mg, 1 mg, Oral, Daily, Karna Christmas, Ayme Short, MD, 1 mg at 08/06/23 5409   guaiFENesin (ROBITUSSIN) 100 MG/5ML liquid 5 mL, 5 mL, Oral, Q4H PRN, Wieting, Richard, MD   ipratropium-albuterol (DUONEB) 0.5-2.5 (3) MG/3ML nebulizer solution 3 mL, 3 mL, Nebulization, Q4H PRN, Esaw Grandchild A, DO, 3 mL at 08/04/23 1955   lamoTRIgine (LAMICTAL) tablet 150 mg, 150 mg, Oral, BID, Rust-Chester, Britton L, NP, 150 mg at 08/06/23 2136   metoprolol succinate (TOPROL-XL) 24 hr tablet 25 mg, 25 mg, Oral, Daily, Esaw Grandchild A, DO, 25 mg at 08/06/23 8119   multivitamin with minerals tablet 1  tablet, 1 tablet, Oral, Daily, Karna Christmas, Persia Lintner, MD, 1 tablet at 08/06/23 0834   nicotine (NICODERM CQ - dosed in mg/24 hours) patch 14 mg, 14 mg, Transdermal, Daily, Ezequiel Essex, NP, 14 mg at 08/06/23 0830   nystatin (MYCOSTATIN) 100000 UNIT/ML suspension 500,000 Units, 5 mL, Oral, QID, Wieting, Richard, MD, 500,000 Units at 08/06/23 2135   ondansetron (ZOFRAN) injection 4 mg, 4 mg, Intravenous, Q6H PRN, Esaw Grandchild A, DO, 4 mg at 07/31/23 2159   Oral care mouth rinse, 15 mL, Mouth Rinse, PRN, Esaw Grandchild A, DO   pantoprazole (PROTONIX) EC tablet 40 mg, 40 mg, Oral, BID, Wieting, Richard, MD, 40 mg at 08/06/23 2136   polyethylene glycol (MIRALAX / GLYCOLAX) packet 17 g, 17 g,  Oral, BID, Alford Highland, MD, 17 g at 08/06/23 2135   predniSONE (DELTASONE) tablet 30 mg, 30 mg, Oral, Q breakfast, Renae Gloss, Richard, MD, 30 mg at 08/06/23 8119   senna (SENOKOT) tablet 17.2 mg, 2 tablet, Oral, QHS, Wieting, Richard, MD, 17.2 mg at 08/06/23 2136   sertraline (ZOLOFT) tablet 25 mg, 25 mg, Oral, Daily, Karna Christmas, Okie Jansson, MD, 25 mg at 08/06/23 0830   thiamine (VITAMIN B1) tablet 100 mg, 100 mg, Oral, Daily, Rust-Chester, Britton L, NP, 100 mg at 08/06/23 0834   traZODone (DESYREL) tablet 50 mg, 50 mg, Oral, QHS PRN, Rust-Chester, Britton L, NP, 50 mg at 08/06/23 2136   umeclidinium bromide (INCRUSE ELLIPTA) 62.5 MCG/ACT 1 puff, 1 puff, Inhalation, Daily, Alford Highland, MD, 1 puff at 08/06/23 1478    ALLERGIES   Patient has no known allergies.     REVIEW OF SYSTEMS    Review of Systems:  Gen:  Denies  fever, sweats, chills weigh loss  HEENT: Denies blurred vision, double vision, ear pain, eye pain, hearing loss, nose bleeds, sore throat Cardiac:  No dizziness, chest pain or heaviness, chest tightness,edema Resp:   reports dyspnea chronically  Gi: Denies swallowing difficulty, stomach pain, nausea or vomiting, diarrhea, constipation, bowel incontinence Gu:  Denies bladder  incontinence, burning urine Ext:   Denies Joint pain, stiffness or swelling Skin: Denies  skin rash, easy bruising or bleeding or hives Endoc:  Denies polyuria, polydipsia , polyphagia or weight change Psych:   Denies depression, insomnia or hallucinations   Other:  All other systems negative   VS: BP (!) 103/56 (BP Location: Right Arm)   Pulse 80   Temp 99 F (37.2 C) (Oral)   Resp 18   Ht 5' 7.99" (1.727 m)   Wt 64.3 kg   SpO2 92%   BMI 21.56 kg/m      PHYSICAL EXAM    GENERAL:NAD, no fevers, chills, no weakness no fatigue HEAD: Normocephalic, atraumatic.  EYES: Pupils equal, round, reactive to light. Extraocular muscles intact. No scleral icterus.  MOUTH: Moist mucosal membrane. Dentition intact. No abscess noted.  EAR, NOSE, THROAT: Clear without exudates. No external lesions.  NECK: Supple. No thyromegaly. No nodules. No JVD.  PULMONARY: decreased breath sounds with mild rhonchi worse at bases bilaterally.  CARDIOVASCULAR: S1 and S2. Regular rate and rhythm. No murmurs, rubs, or gallops. No edema. Pedal pulses 2+ bilaterally.  GASTROINTESTINAL: Soft, nontender, nondistended. No masses. Positive bowel sounds. No hepatosplenomegaly.  MUSCULOSKELETAL: No swelling, clubbing, or edema. Range of motion full in all extremities.  NEUROLOGIC: Cranial nerves II through XII are intact. No gross focal neurological deficits. Sensation intact. Reflexes intact.  SKIN: No ulceration, lesions, rashes, or cyanosis. Skin warm and dry. Turgor intact.  PSYCHIATRIC: Mood, affect within normal limits. The patient is awake, alert and oriented x 3. Insight, judgment intact.       IMAGING     CT CHEST WITH BILATERAL BRONCHITIC CHANGES, ANTERIORLY ON RIGHT THERE IS A LARGE BULLA AT RIGHT LOWER LOBE, POSTERIORLY THERE IS ATELECTASIS AT LEFT LOWER LOBE.    ASSESSMENT/PLAN   Acute on chronic hypoxemic respiratory failure     - noted residual bronchitic changes - agree with current  solumedrol       - completed unasyn course       -continue COPD care path with neb treatments and breo/incruse      -continue mucomyst 20% - 4ml BID  COPD-chronic with exacerbation post influenza -continue solumedrol 40mg - can wean to prednisone  with taper -BNP is normal unlikely overlap of CHF -continue current antimicrobials   Atelectasis -RESOLVED  - worse at LLL  Starting metaneb with albuterol q8h x 3 d   Tobacco dependence   Continue nicotine replacement   OSA - CPAP at bedtime with setting by RT           Thank you for allowing me to participate in the care of this patient.   Patient/Family are satisfied with care plan and all questions have been answered.    Provider disclosure: Patient with at least one acute or chronic illness or injury that poses a threat to life or bodily function and is being managed actively during this encounter.  All of the below services have been performed independently by signing provider:  review of prior documentation from internal and or external health records.  Review of previous and current lab results.  Interview and comprehensive assessment during patient visit today. Review of current and previous chest radiographs/CT scans. Discussion of management and test interpretation with health care team and patient/family.   This document was prepared using Dragon voice recognition software and may include unintentional dictation errors.     Vida Rigger, M.D.  Division of Pulmonary & Critical Care Medicine

## 2023-08-07 NOTE — Progress Notes (Signed)
 Progress Note   [name] UJW:[id number] DOB: [birth year] DOA: 07/12/2023     26 DOS: the patient was seen and examined on 08/07/2023   Brief hospital course: 68 year old male with a past medical history significant for COPD, alcohol dependence, and seizure disorder who presented to [facility] ED on 07/12/2023 from his group home due to altered mental status.  Per report he has been altered over the last 24 hours.   On arrival to the ED he remained somnolent and altered, oriented only to self.  Given his respiratory distress he was placed on BiPAP and eventually required intubation and levophed.  Positive for influenza A. Started on tamiflu.    07/15/23- patient passed SBT today.  He is liberated from MV on to [facility] Remains with encephalopathy.  07/16/23- patient is improved overnight. He is on home psychiatric medications now.  3/8 -- weaned to 10 L/min HFNC O2 3/9 -- O2 sats decompensated early AM, transferred to stepdown, back on heated HF o2 at 70-80%FiO2 - resume IV steroids.  Negative RVP, normal BMP, normal LA, CXR neg 3/10 -- remains on heated HF O2 50 L/min 65% FiO2.  Negative CTA chest for PE, but showed new RLL infiltrate, possible and started on Unasyn 3/11 -- 40 L/min 60-65% fiO2 3/12.  Patient on heated high flow nasal cannula 49% oxygen this morning with 40 L flow.  Pulmonary reconsulted. 3/13.  Patient still on 47% oxygen and 40 L flow on heated high flow nasal cannula.  Patient had a nosebleed this morning requiring me to stop aspirin and Lovenox injections and give Afrin nasal spray. 3/14.  No further nosebleed.  Awaiting insurance authorization for LTAC.  Still on heated high flow nasal cannula around 51% oxygen and 40 L flow as of this morning. 3/15.  Patient down to 34% oxygen this am heated high flow nasal cannula when I saw him this morning on 40 L flow. 3/16.  Patient was up to 60% oxygen on heated high flow nasal cannula 50 L flow and respiratory therapist was able  to get down to 45% oxygen 45 L flow. 3/17.  Patient was on 50% oxygen 45 L flow when I saw him today.  I had to do a peer to peer for LTAC approval.  Repeat chest x-ray still showing infiltrates.  Will repeat MRSA PCR which is negative.  Repeat CRP trended down to 3.0.  Procalcitonin negative x 2. 3/18.  Patient accepted to LTAC but LTAC does not have a bed today.  This morning the patient was on heated high flow nasal cannula 43% oxygen and 47% liter flow but looks like this afternoon and is on 50% oxygen 50 L flow.  I resumed care again on 08/07/2023.  [facility] is pending an available bed.    Assessment and Plan:  * Acute respiratory failure with hypoxia and hypercapnia (HCC) Remains on heated high flow nasal cannula 50% FiO2 this morning with 50 L/min flow.  Appreciate pulmonary consultation.   Patient intubated during the hospital course.   Added inhalers on 3/12.   Continue prednisone.   Incentive spirometry and MetaNeb treatments.   Authorization obtained for LTAC.  Just awaiting available bed.  Repeat chest x-ray still showing infiltrates. (Patient completed Rocephin and Zithromax earlier in the hospital course and Unasyn recently). Cr trended better to 3 and procalcitonin negative x 2.  Multifocal pneumonia Repeat chest x-ray read as worsening infiltrates.  Patient had Rocephin and Zithromax earlier in the hospital  course and recently Unasyn.  Repeat mrsa pcr is negative, procalcitonin negative x 2 and crp trended better to 3  Septic shock (HCC) Present on admission and now has resolved.  Seizures (HCC) Continue Lamictal  Malnutrition of moderate degree Continue supplements  Gastroesophageal reflux disease PPI to twice daily.  Patient has dilated fluid-filled esophagus which can increase risk of of aspiration.  Patient does not complain of any nausea or vomiting.  Wondering if patient has achalasia.  Requiring too much oxygen for any endoscopy procedure at  this point.  Patient's sister told me that he has had hiccups in the past and used to vomit in order to make himself stop hiccuping.  Influenza A with pneumonia On admission  Epistaxis No recurrence.  Likely from heated high flow nasal cannula prolonged use.  Aspirin and Lovenox discontinued.  AKI (acute kidney injury) (HCC) Resolved.  Creatinine 2.0 on admission and creatinine 0.96 on last check.  Acute metabolic encephalopathy Secondary to septic shock      Subjective: Pt seated edge of bed this AM.  Denies any issues or complaints. No trouble breathing.  Physical Exam: Vitals:   08/07/23 0500 08/07/23 0734 08/07/23 0953 08/07/23 1129  BP:  (!) 103/56 126/66 (!) 107/58  Pulse:  80  89  Resp:   20   Temp:  99 F (37.2 C)  98.6 F (37 C)  TempSrc:  Oral    SpO2:  92% 93% 97%  Weight: 64.3 kg     Height:        General exam: awake, alert, no acute distress Respiratory system: diminished breath sounds, normal respiratory effort at rest on HHF O2 50 L/min 50% FiO2 Cardiovascular system: normal S1/S2, RRR Gastrointestinal system: soft, NT, ND, no HSM felt, +bowel sounds. Central nervous system: A&O x 2+. no gross focal neurologic deficits, normal speech Extremities: moves all, no edema, normal tone Psychiatry: normal mood, flat affect     Data Reviewed:  Notable labs 3/18  -- normal BMP.  Hbg stable 11.8 Procal < 0.10  Family Communication: Spoke with Darl Pikes on the phone  Disposition: Status is: Inpatient Remains inpatient appropriate because: Insurance approved LTAC facility but the LTAC does not have a bed.  Planned Discharge Destination: LTAC    Time spent: 32 minutes  Author: Pennie Banter, DO 08/07/2023 1:03 PM  For on call review [email or web address].

## 2023-08-08 ENCOUNTER — Ambulatory Visit: Payer: 59 | Admitting: Podiatry

## 2023-08-08 DIAGNOSIS — J9601 Acute respiratory failure with hypoxia: Secondary | ICD-10-CM | POA: Diagnosis not present

## 2023-08-08 DIAGNOSIS — J9602 Acute respiratory failure with hypercapnia: Secondary | ICD-10-CM | POA: Diagnosis not present

## 2023-08-08 NOTE — Plan of Care (Signed)
  Problem: Activity: Goal: Ability to tolerate increased activity will improve Outcome: Progressing   Problem: Respiratory: Goal: Ability to maintain a clear airway and adequate ventilation will improve Outcome: Progressing   Problem: Role Relationship: Goal: Method of communication will improve Outcome: Progressing   Problem: Education: Goal: Knowledge of General Education information will improve Description: Including pain rating scale, medication(s)/side effects and non-pharmacologic comfort measures Outcome: Progressing   Problem: Health Behavior/Discharge Planning: Goal: Ability to manage health-related needs will improve Outcome: Progressing   Problem: Clinical Measurements: Goal: Ability to maintain clinical measurements within normal limits will improve Outcome: Progressing Goal: Will remain free from infection Outcome: Progressing Goal: Diagnostic test results will improve Outcome: Progressing Goal: Respiratory complications will improve Outcome: Progressing Goal: Cardiovascular complication will be avoided Outcome: Progressing   Problem: Activity: Goal: Risk for activity intolerance will decrease Outcome: Progressing   Problem: Nutrition: Goal: Adequate nutrition will be maintained Outcome: Progressing   Problem: Coping: Goal: Level of anxiety will decrease Outcome: Progressing   Problem: Elimination: Goal: Will not experience complications related to bowel motility Outcome: Progressing Goal: Will not experience complications related to urinary retention Outcome: Progressing   Problem: Pain Managment: Goal: General experience of comfort will improve and/or be controlled Outcome: Progressing   Problem: Safety: Goal: Ability to remain free from injury will improve Outcome: Progressing   Problem: Skin Integrity: Goal: Risk for impaired skin integrity will decrease Outcome: Progressing   Problem: Education: Goal: Ability to describe self-care  measures that may prevent or decrease complications (Diabetes Survival Skills Education) will improve Outcome: Progressing Goal: Individualized Educational Video(s) Outcome: Progressing   Problem: Coping: Goal: Ability to adjust to condition or change in health will improve Outcome: Progressing   Problem: Fluid Volume: Goal: Ability to maintain a balanced intake and output will improve Outcome: Progressing   Problem: Health Behavior/Discharge Planning: Goal: Ability to identify and utilize available resources and services will improve Outcome: Progressing Goal: Ability to manage health-related needs will improve Outcome: Progressing   Problem: Metabolic: Goal: Ability to maintain appropriate glucose levels will improve Outcome: Progressing   Problem: Nutritional: Goal: Maintenance of adequate nutrition will improve Outcome: Progressing Goal: Progress toward achieving an optimal weight will improve Outcome: Progressing   Problem: Skin Integrity: Goal: Risk for impaired skin integrity will decrease Outcome: Progressing   Problem: Tissue Perfusion: Goal: Adequacy of tissue perfusion will improve Outcome: Progressing

## 2023-08-08 NOTE — Progress Notes (Signed)
 Physical Therapy Treatment Patient Details Name: Jeremiah Nguyen MRN: [id number] DOB: 1955-11-03 Today's Date: 08/08/2023   History of Present Illness Patient is a 68 year old male with history of COPD, alcohol dependence, and seizure disorder who presented on 07/12/2023 from group home due to altered mental status. Positive for influenza A, required mechanical ventilation, now on HHFNC.    PT Comments  Patient semi reclined in bed on arrival and agreeable to PT treatment session. Able to complete bed mobility with supervision and sit to stand with CGA-minA. Completed sit to stand 2 x 5 reps from low bed surface with B UE support. Performed standing marching with B UE support. SpO2 dropped as low as 84% with activity on 50L HHFNC. Discharge plan remains appropriate.     If plan is discharge home, recommend the following: Two people to help with walking and/or transfers;A lot of help with bathing/dressing/bathroom;Assist for transportation;Help with stairs or ramp for entrance;Supervision due to cognitive status;Assistance with cooking/housework;Assistance with feeding;Direct supervision/assist for medications management;Direct supervision/assist for financial management   Can travel by private vehicle     No  Equipment Recommendations  None recommended by PT    Recommendations for Other Services       Precautions / Restrictions Precautions Precautions: Fall Recall of Precautions/Restrictions: Impaired Precaution/Restrictions Comments: monitor Sp02 Restrictions Weight Bearing Restrictions Per Provider Order: No     Mobility  Bed Mobility Overal bed mobility: Needs Assistance Bed Mobility: Supine to Sit, Sit to Supine     Supine to sit: Supervision Sit to supine: Supervision        Transfers Overall transfer level: Needs assistance Equipment used: Rolling Davis Vannatter (2 wheels) Transfers: Sit to/from Stand Sit to Stand: Min assist, Contact guard assist           General  transfer comment: performed repeated sit to stand 2 x 5 from low bed surface with CGA-minA    Ambulation/Gait                   Stairs             Wheelchair Mobility     Tilt Bed    Modified Rankin (Stroke Patients Only)       Balance Overall balance assessment: Needs assistance Sitting-balance support: Feet supported Sitting balance-Leahy Scale: Good     Standing balance support: Bilateral upper extremity supported, During functional activity Standing balance-Leahy Scale: Fair                              Hotel manager: Impaired  Cognition Arousal: Alert Behavior During Therapy: Flat affect   PT - Cognitive impairments: No apparent impairments, Memory, Sequencing                         Following commands: Impaired Following commands impaired: Follows one step commands with increased time    Cueing    Exercises Other Exercises Other Exercises: sit to stand 2 x 5 with B UE support Other Exercises: Standing marching x 10 each LE with B UE support    General Comments General comments (skin integrity, edema, etc.): on 50L HHFNC with spO2 dropping to 84%      Pertinent Vitals/Pain Pain Assessment Pain Assessment: No/denies pain    Home Living  Prior Function            PT Goals (current goals can now be found in the care plan section) Acute Rehab PT Goals PT Goal Formulation: With patient Time For Goal Achievement: 08/14/23 Potential to Achieve Goals: Fair Progress towards PT goals: Progressing toward goals    Frequency    Min 2X/week      PT Plan      Co-evaluation              AM-PAC PT "6 Clicks" Mobility   Outcome Measure  Help needed turning from your back to your side while in a flat bed without using bedrails?: A Little Help needed moving from lying on your back to sitting on the side of a flat bed without using bedrails?: A  Little Help needed moving to and from a bed to a chair (including a wheelchair)?: A Little Help needed standing up from a chair using your arms (e.g., wheelchair or bedside chair)?: A Little Help needed to walk in hospital room?: Total Help needed climbing 3-5 steps with a railing? : Total 6 Click Score: 14    End of Session   Activity Tolerance: Patient limited by fatigue Patient left: in bed;with call bell/phone within reach;with bed alarm set Nurse Communication: Mobility status PT Visit Diagnosis: Muscle weakness (generalized) (M62.81);Unsteadiness on feet (R26.81)     Time: 4098-1191 PT Time Calculation (min) (ACUTE ONLY): 13 min  Charges:    $Therapeutic Activity: 8-22 mins PT General Charges $$ ACUTE PT VISIT: 1 Visit                     Maylon Peppers, PT, DPT Physical Therapist - [facility]  [facility]    Sou Nohr A Avari Gelles 08/08/2023, 3:43 PM

## 2023-08-08 NOTE — Progress Notes (Signed)
 Progress Note   [name] NWG:[id number] DOB: 12/16/1955 DOA: 07/12/2023     27 DOS: the patient was seen and examined on 08/08/2023   Brief hospital course: 68 year old male with a past medical history significant for COPD, alcohol dependence, and seizure disorder who presented to [facility] ED on 07/12/2023 from his group home due to altered mental status.  Per report he has been altered over the last 24 hours.   On arrival to the ED he remained somnolent and altered, oriented only to self.  Given his respiratory distress he was placed on BiPAP and eventually required intubation and levophed.  Positive for influenza A. Started on tamiflu.    07/15/23- patient passed SBT today.  He is liberated from MV on to [facility]. Remains with encephalopathy.  07/16/23- patient is improved overnight. He is on home psychiatric medications now.  3/8 -- weaned to 10 L/min HFNC O2 3/9 -- O2 sats decompensated early AM, transferred to stepdown, back on heated HF o2 at 70-80%FiO2 - resume IV steroids.  Negative RVP, normal BMP, normal LA, CXR neg 3/10 -- remains on heated HF O2 50 L/min 65% FiO2.  Negative CTA chest for PE, but showed new RLL infiltrate, possible and started on Unasyn 3/11 -- 40 L/min 60-65% fiO2 3/12.  Patient on heated high flow nasal cannula 49% oxygen this morning with 40 L flow.  Pulmonary reconsulted. 3/13.  Patient still on 47% oxygen and 40 L flow on heated high flow nasal cannula.  Patient had a nosebleed this morning requiring me to stop aspirin and Lovenox injections and give Afrin nasal spray. 3/14.  No further nosebleed.  Awaiting insurance authorization for LTAC.  Still on heated high flow nasal cannula around 51% oxygen and 40 L flow as of this morning. 3/15.  Patient down to 34% oxygen this am heated high flow nasal cannula when I saw him this morning on 40 L flow. 3/16.  Patient was up to 60% oxygen on heated high flow nasal cannula 50 L flow and respiratory therapist was able  to get down to 45% oxygen 45 L flow. 3/17.  Patient was on 50% oxygen 45 L flow when I saw him today.  I had to do a peer to peer for LTAC approval.  Repeat chest x-ray still showing infiltrates.  Will repeat MRSA PCR which is negative.  Repeat CRP trended down to 3.0.  Procalcitonin negative x 2. 3/18.  Patient accepted to LTAC but LTAC does not have a bed today.  This morning the patient was on heated high flow nasal cannula 43% oxygen and 47% liter flow but looks like this afternoon and is on 50% oxygen 50 L flow.  I resumed care again on 08/07/2023.  Discharge to [facility] is pending an available bed.    Assessment and Plan:  * Acute respiratory failure with hypoxia and hypercapnia (HCC) Remains on heated high flow nasal cannula 50% FiO2 this morning with 50 L/min flow.  Appreciate pulmonary consultation.   Patient intubated during the hospital course.   Added inhalers on 3/12.   Continue prednisone.   Incentive spirometry and MetaNeb treatments.   Authorization obtained for LTAC.  Just awaiting available bed.  Repeat chest x-ray still showing infiltrates. (Patient completed Rocephin and Zithromax earlier in the hospital course and Unasyn recently). Cr trended better to 3 and procalcitonin negative x 2.  Multifocal pneumonia Repeat chest x-ray read as worsening infiltrates.  Patient had Rocephin and Zithromax earlier in the hospital  course and recently Unasyn.  Repeat mrsa pcr is negative, procalcitonin negative x 2 and crp trended better to 3  Septic shock (HCC) Present on admission and now has resolved.  Seizures (HCC) Continue Lamictal  Malnutrition of moderate degree Continue supplements  Gastroesophageal reflux disease PPI to twice daily.  Patient has dilated fluid-filled esophagus which can increase risk of of aspiration.  Patient does not complain of any nausea or vomiting.  Wondering if patient has achalasia.  Requiring too much oxygen for any endoscopy procedure at  this point.  Patient's sister told me that he has had hiccups in the past and used to vomit in order to make himself stop hiccuping.  Influenza A with pneumonia On admission  Epistaxis No recurrence.  Likely from heated high flow nasal cannula prolonged use.  Aspirin and Lovenox discontinued.  AKI (acute kidney injury) (HCC) Resolved.  Creatinine 2.0 on admission and creatinine 0.96 on last check.  Acute metabolic encephalopathy Secondary to septic shock      Subjective: Pt sleeping when seen this AM, wakes briefly, Denies complaints.  Physical Exam: Vitals:   08/08/23 0422 08/08/23 0500 08/08/23 0818 08/08/23 1155  BP: 113/65  (!) 108/51 138/74  Pulse: 93  89 (!) 109  Resp: 18   20  Temp: 98.7 F (37.1 C)  (!) 97.4 F (36.3 C) 97.8 F (36.6 C)  TempSrc: Oral     SpO2: 97%  98% 90%  Weight:  66.4 kg    Height:        General exam: sleeping comfortably, wakes to voice, no acute distress Respiratory system: diminished breath sounds, normal respiratory effort at rest on HHF O2 50 L/min 50% FiO2 Cardiovascular system: normal S1/S2, RRR Gastrointestinal system: soft, NT, ND, no HSM felt, +bowel sounds. Central nervous system: no gross focal neurologic deficits, normal speech Extremities: moves all, no edema, normal tone Psychiatry: normal mood, flat affect     Data Reviewed:  Notable labs 3/18  -- normal BMP.  Hbg stable 11.8 Procal < 0.10  Family Communication: none. No new medical updates at this time.  Clinical status is unchanged.  Disposition: Status is: Inpatient Remains inpatient appropriate because: Insurance approved LTAC facility but the LTAC does not have a bed.  Planned Discharge Destination: LTAC    Time spent: 25 minutes  Author: Pennie Banter, DO 08/08/2023 2:06 PM  For on call review [email or web address].

## 2023-08-09 DIAGNOSIS — J9601 Acute respiratory failure with hypoxia: Secondary | ICD-10-CM | POA: Diagnosis not present

## 2023-08-09 DIAGNOSIS — J9602 Acute respiratory failure with hypercapnia: Secondary | ICD-10-CM | POA: Diagnosis not present

## 2023-08-09 NOTE — TOC Progression Note (Signed)
 Transition of Care [facility]) - Progression Note    Patient Details  Name: Jeremiah Nguyen MRN: [id number] Date of Birth: 04/26/1956  Transition of Care [facility]) CM/SW Contact  [name], LCSW Phone Number: 08/09/2023, 10:41 AM  Clinical Narrative:    Consuello Closs at [facility] states she will let CSW know if there is a bed today.        Expected Discharge Plan and Services                                               Social Determinants of Health (SDOH) Interventions SDOH Screenings   Food Insecurity: Patient Unable To Answer (07/13/2023)  Housing: Patient Unable To Answer (07/13/2023)  Transportation Needs: Patient Unable To Answer (07/13/2023)  Utilities: Patient Unable To Answer (07/13/2023)  Depression (PHQ2-9): Low Risk  (02/25/2019)  Financial Resource Strain: Low Risk  (06/30/2018)  Physical Activity: Inactive (06/30/2018)  Social Connections: Patient Unable To Answer (07/13/2023)  Stress: Stress Concern Present (06/30/2018)  Tobacco Use: High Risk (07/12/2023)    Readmission Risk Interventions     No data to display

## 2023-08-09 NOTE — Progress Notes (Signed)
 Progress Note   [name] NWG:[id number] DOB: [birth year] DOA: 07/12/2023     28 DOS: the patient was seen and examined on 08/09/2023   Brief hospital course: 68 year old male with a past medical history significant for COPD, alcohol dependence, and seizure disorder who presented to [facility] ED on 07/12/2023 from his group home due to altered mental status.  Per report he has been altered over the last 24 hours.   On arrival to the ED he remained somnolent and altered, oriented only to self.  Given his respiratory distress he was placed on BiPAP and eventually required intubation and levophed.  Positive for influenza A. Started on tamiflu.    07/15/23- patient passed SBT today.  He is liberated from MV on to [facility]. Remains with encephalopathy.  07/16/23- patient is improved overnight. He is on home psychiatric medications now.  3/8 -- weaned to 10 L/min HFNC O2 3/9 -- O2 sats decompensated early AM, transferred to stepdown, back on heated HF o2 at 70-80%FiO2 - resume IV steroids.  Negative RVP, normal BMP, normal LA, CXR neg 3/10 -- remains on heated HF O2 50 L/min 65% FiO2.  Negative CTA chest for PE, but showed new RLL infiltrate, possible and started on Unasyn 3/11 -- 40 L/min 60-65% fiO2 3/12.  Patient on heated high flow nasal cannula 49% oxygen this morning with 40 L flow.  Pulmonary reconsulted. 3/13.  Patient still on 47% oxygen and 40 L flow on heated high flow nasal cannula.  Patient had a nosebleed this morning requiring me to stop aspirin and Lovenox injections and give Afrin nasal spray. 3/14.  No further nosebleed.  Awaiting insurance authorization for LTAC.  Still on heated high flow nasal cannula around 51% oxygen and 40 L flow as of this morning. 3/15.  Patient down to 34% oxygen this am heated high flow nasal cannula when I saw him this morning on 40 L flow. 3/16.  Patient was up to 60% oxygen on heated high flow nasal cannula 50 L flow and respiratory therapist was able  to get down to 45% oxygen 45 L flow. 3/17.  Patient was on 50% oxygen 45 L flow when I saw him today.  I had to do a peer to peer for LTAC approval.  Repeat chest x-ray still showing infiltrates.  Will repeat MRSA PCR which is negative.  Repeat CRP trended down to 3.0.  Procalcitonin negative x 2. 3/18.  Patient accepted to LTAC but LTAC does not have a bed today.  This morning the patient was on heated high flow nasal cannula 43% oxygen and 47% liter flow but looks like this afternoon and is on 50% oxygen 50 L flow.  I resumed care again on 08/07/2023.  Discharge to [facility] is pending an available bed.    Assessment and Plan:  * Acute respiratory failure with hypoxia and hypercapnia (HCC) Remains on heated high flow nasal cannula 50% FiO2 this morning with 50 L/min flow.  Appreciate pulmonary consultation.   Patient intubated during the hospital course.   Added inhalers on 3/12.   Continue prednisone.   Incentive spirometry and MetaNeb treatments.   Authorization obtained for LTAC.  Just awaiting available bed.  Repeat chest x-ray still showing infiltrates. (Patient completed Rocephin and Zithromax earlier in the hospital course and Unasyn recently). Cr trended better to 3 and procalcitonin negative x 2.  Multifocal pneumonia Repeat chest x-ray read as worsening infiltrates.  Patient had Rocephin and Zithromax earlier in the hospital  course and recently Unasyn.  Repeat mrsa pcr is negative, procalcitonin negative x 2 and crp trended better to 3  Septic shock (HCC) Present on admission and now has resolved.  Seizures (HCC) Continue Lamictal  Malnutrition of moderate degree Continue supplements  Gastroesophageal reflux disease PPI to twice daily.  Patient has dilated fluid-filled esophagus which can increase risk of of aspiration.  Patient does not complain of any nausea or vomiting.  Wondering if patient has achalasia.  Requiring too much oxygen for any endoscopy procedure at  this point.  Patient's sister told me that he has had hiccups in the past and used to vomit in order to make himself stop hiccuping.  Influenza A with pneumonia On admission  Epistaxis No recurrence.  Likely from heated high flow nasal cannula prolonged use.  Aspirin and Lovenox discontinued.  AKI (acute kidney injury) (HCC) Resolved.  Creatinine 2.0 on admission and creatinine 0.96 on last check.  Acute metabolic encephalopathy Secondary to septic shock      Subjective: Pt awake resting in bed this AM.  He denies complaints. States feeling okay.  Asks for more ice water.  Physical Exam: Vitals:   08/09/23 0403 08/09/23 0500 08/09/23 0837 08/09/23 1157  BP:   114/60 115/62  Pulse:   88   Resp:    18  Temp:   98.5 F (36.9 C) 98.6 F (37 C)  TempSrc:    Oral  SpO2: 97%  91% 90%  Weight:  65.7 kg    Height:        General exam: awake, alert, no acute distress Respiratory system: diminished breath sounds, normal respiratory effort at rest on HHF O2 50 L/min 50% FiO2 Cardiovascular system: normal S1/S2, RRR Gastrointestinal system: soft, NT, ND, no HSM felt, +bowel sounds. Central nervous system: no gross focal neurologic deficits, normal speech Extremities: moves all, no edema, normal tone Psychiatry: normal mood, flat affect     Data Reviewed:  Notable labs 3/18  -- normal BMP.  Hbg stable 11.8 Procal < 0.10  Family Communication: none. No new medical updates at this time.  Clinical status is unchanged.  Disposition: Status is: Inpatient Remains inpatient appropriate because: Insurance approved LTAC facility but the LTAC does not have a bed.  Planned Discharge Destination: LTAC    Time spent: 25 minutes  Author: Pennie Banter, DO 08/09/2023 1:10 PM  For on call review [email or web address].

## 2023-08-10 DIAGNOSIS — J9601 Acute respiratory failure with hypoxia: Secondary | ICD-10-CM | POA: Diagnosis not present

## 2023-08-10 DIAGNOSIS — J9602 Acute respiratory failure with hypercapnia: Secondary | ICD-10-CM | POA: Diagnosis not present

## 2023-08-10 MED ORDER — IPRATROPIUM-ALBUTEROL 0.5-2.5 (3) MG/3ML IN SOLN
3.0000 mL | Freq: Three times a day (TID) | RESPIRATORY_TRACT | Status: DC
  Administered 2023-08-10 – 2023-08-12 (×7): 3 mL via RESPIRATORY_TRACT
  Filled 2023-08-10 (×8): qty 3

## 2023-08-10 MED ORDER — ALBUTEROL SULFATE (2.5 MG/3ML) 0.083% IN NEBU: 2.5000 mg | INHALATION_SOLUTION | RESPIRATORY_TRACT | Status: DC | PRN

## 2023-08-10 NOTE — Plan of Care (Signed)
  Problem: Activity: Goal: Ability to tolerate increased activity will improve Outcome: Progressing   Problem: Respiratory: Goal: Ability to maintain a clear airway and adequate ventilation will improve Outcome: Progressing   Problem: Education: Goal: Knowledge of General Education information will improve Description: Including pain rating scale, medication(s)/side effects and non-pharmacologic comfort measures Outcome: Progressing   Problem: Health Behavior/Discharge Planning: Goal: Ability to manage health-related needs will improve Outcome: Progressing   Problem: Clinical Measurements: Goal: Ability to maintain clinical measurements within normal limits will improve Outcome: Progressing Goal: Will remain free from infection Outcome: Progressing Goal: Diagnostic test results will improve Outcome: Progressing Goal: Respiratory complications will improve Outcome: Progressing Goal: Cardiovascular complication will be avoided Outcome: Progressing   Problem: Activity: Goal: Risk for activity intolerance will decrease Outcome: Progressing   Problem: Nutrition: Goal: Adequate nutrition will be maintained Outcome: Progressing   Problem: Coping: Goal: Level of anxiety will decrease Outcome: Progressing   Problem: Elimination: Goal: Will not experience complications related to bowel motility Outcome: Progressing Goal: Will not experience complications related to urinary retention Outcome: Progressing   Problem: Pain Managment: Goal: General experience of comfort will improve and/or be controlled Outcome: Progressing   Problem: Safety: Goal: Ability to remain free from injury will improve Outcome: Progressing   Problem: Coping: Goal: Ability to adjust to condition or change in health will improve Outcome: Progressing

## 2023-08-10 NOTE — Progress Notes (Addendum)
 Progress Note   [name]:[id number] DOB: Jan 17, 1956 DOA: 07/12/2023     29 DOS: the patient was seen and examined on 08/10/2023   Brief hospital course: 68 year old male with a past medical history significant for COPD, alcohol dependence, and seizure disorder who presented to [facility] ED on 07/12/2023 from his group home due to altered mental status.  Per report he has been altered over the last 24 hours.   On arrival to the ED he remained somnolent and altered, oriented only to self.  Given his respiratory distress he was placed on BiPAP and eventually required intubation and levophed.  Positive for influenza A. Started on tamiflu.    07/15/23- patient passed SBT today.  He is liberated from MV on to [facility]. Remains with encephalopathy.  07/16/23- patient is improved overnight. He is on home psychiatric medications now.  3/8 -- weaned to 10 L/min HFNC O2 3/9 -- O2 sats decompensated early AM, transferred to stepdown, back on heated HF o2 at 70-80%FiO2 - resume IV steroids.  Negative RVP, normal BMP, normal LA, CXR neg 3/10 -- remains on heated HF O2 50 L/min 65% FiO2.  Negative CTA chest for PE, but showed new RLL infiltrate, possible and started on Unasyn 3/11 -- 40 L/min 60-65% fiO2 3/12.  Patient on heated high flow nasal cannula 49% oxygen this morning with 40 L flow.  Pulmonary reconsulted. 3/13.  Patient still on 47% oxygen and 40 L flow on heated high flow nasal cannula.  Patient had a nosebleed this morning requiring me to stop aspirin and Lovenox injections and give Afrin nasal spray. 3/14.  No further nosebleed.  Awaiting insurance authorization for LTAC.  Still on heated high flow nasal cannula around 51% oxygen and 40 L flow as of this morning. 3/15.  Patient down to 34% oxygen this am heated high flow nasal cannula when I saw him this morning on 40 L flow. 3/16.  Patient was up to 60% oxygen on heated high flow nasal cannula 50 L flow and respiratory therapist was able  to get down to 45% oxygen 45 L flow. 3/17.  Patient was on 50% oxygen 45 L flow when I saw him today.  I had to do a peer to peer for LTAC approval.  Repeat chest x-ray still showing infiltrates.  Will repeat MRSA PCR which is negative.  Repeat CRP trended down to 3.0.  Procalcitonin negative x 2. 3/18.  Patient accepted to LTAC but LTAC does not have a bed today.  This morning the patient was on heated high flow nasal cannula 43% oxygen and 47% liter flow but looks like this afternoon and is on 50% oxygen 50 L flow.  I resumed care again on 08/07/2023.  Discharge to [facility] is pending an available bed.    Assessment and Plan:  * Acute respiratory failure with hypoxia and hypercapnia (HCC) Remains on HHF oxygen 40% FiO2 with 50 L/min flow.   --Appreciate pulmonary consultation.   --Patient intubated during the hospital course.   --Added inhalers on 3/12.   --Continue prednisone.   --Incentive spirometry and MetaNeb treatments.   --Authorization obtained for LTAC.  Just awaiting available bed. --Repeat CXR again tomorrow AM  Multifocal pneumonia Repeat chest x-ray read as worsening infiltrates.   Patient had Rocephin and Zithromax earlier in the hospital course and recently Unasyn.  Repeat mrsa pcr is negative, procalcitonin negative x 2 and crp trended better to 3  Septic shock (HCC) Present on admission and  now has resolved.  Seizures (HCC) Continue Lamictal  Malnutrition of moderate degree Continue supplements  Gastroesophageal reflux disease PPI to twice daily.  Patient has dilated fluid-filled esophagus which can increase risk of of aspiration.  Patient does not complain of any nausea or vomiting.  Wondering if patient has achalasia.  Requiring too much oxygen for any endoscopy procedure at this point.  Patient's sister told me that he has had hiccups in the past and used to vomit in order to make himself stop hiccuping.  Influenza A with pneumonia On  admission  Epistaxis No recurrence.  Likely from heated high flow nasal cannula prolonged use.  Aspirin and Lovenox discontinued.  AKI (acute kidney injury) (HCC) Resolved.  Creatinine 2.0 on admission and creatinine 0.96 on last check.  Acute metabolic encephalopathy Secondary to septic shock      Subjective: Pt seated edge of bed having breakfast.  Reports feeling well. No complaints or issues.  Physical Exam: Vitals:   08/10/23 0541 08/10/23 0847 08/10/23 1258 08/10/23 1358  BP:  118/76 138/79   Pulse:  87 (!) 113 (!) 103  Resp:    20  Temp:  98.7 F (37.1 C) (!) 97.5 F (36.4 C)   TempSrc:  Oral Oral   SpO2:  90% 97% 93%  Weight: 67.1 kg     Height:        General exam: awake, alert, no acute distress Respiratory system: diminished breath sounds, normal respiratory effort at rest on HHF O2 50 L/min 50% FiO2 Cardiovascular system: normal S1/S2, RRR Gastrointestinal system: soft, NT, ND, no HSM felt, +bowel sounds. Central nervous system: no gross focal neurologic deficits, normal speech Extremities: moves all, no edema, normal tone Psychiatry: normal mood, flat affect     Data Reviewed:  Notable labs 3/18  -- normal BMP.  Hbg stable 11.8 Procal < 0.10  Family Communication: none. No new medical updates at this time.  Clinical status is unchanged.  Disposition: Status is: Inpatient Remains inpatient appropriate because: Insurance approved LTAC facility but the LTAC does not have a bed.  Planned Discharge Destination: LTAC    Time spent: 25 minutes  Author: Pennie Banter, DO 08/10/2023 2:18 PM  For on call review [email or web address].

## 2023-08-11 ENCOUNTER — Inpatient Hospital Stay

## 2023-08-11 DIAGNOSIS — J9602 Acute respiratory failure with hypercapnia: Secondary | ICD-10-CM | POA: Diagnosis not present

## 2023-08-11 DIAGNOSIS — J9601 Acute respiratory failure with hypoxia: Secondary | ICD-10-CM | POA: Diagnosis not present

## 2023-08-11 NOTE — Progress Notes (Signed)
 Progress Note   [name] XBM:[id number] DOB: [birth year] DOA: 07/12/2023     30 DOS: the patient was seen and examined on 08/11/2023   Brief hospital course: 68 year old male with a past medical history significant for COPD, alcohol dependence, and seizure disorder who presented to [facility] ED on 07/12/2023 from his group home due to altered mental status.  Per report he has been altered over the last 24 hours.   On arrival to the ED he remained somnolent and altered, oriented only to self.  Given his respiratory distress he was placed on BiPAP and eventually required intubation and levophed.  Positive for influenza A. Started on tamiflu.    07/15/23- patient passed SBT today.  He is liberated from MV on to [facility]. Remains with encephalopathy.  07/16/23- patient is improved overnight. He is on home psychiatric medications now.  3/8 -- weaned to 10 L/min HFNC O2 3/9 -- O2 sats decompensated early AM, transferred to stepdown, back on heated HF o2 at 70-80%FiO2 - resume IV steroids.  Negative RVP, normal BMP, normal LA, CXR neg 3/10 -- remains on heated HF O2 50 L/min 65% FiO2.  Negative CTA chest for PE, but showed new RLL infiltrate, possible and started on Unasyn 3/11 -- 40 L/min 60-65% fiO2 3/12.  Patient on heated high flow nasal cannula 49% oxygen this morning with 40 L flow.  Pulmonary reconsulted. 3/13.  Patient still on 47% oxygen and 40 L flow on heated high flow nasal cannula.  Patient had a nosebleed this morning requiring me to stop aspirin and Lovenox injections and give Afrin nasal spray. 3/14.  No further nosebleed.  Awaiting insurance authorization for LTAC.  Still on heated high flow nasal cannula around 51% oxygen and 40 L flow as of this morning. 3/15.  Patient down to 34% oxygen this am heated high flow nasal cannula when I saw him this morning on 40 L flow. 3/16.  Patient was up to 60% oxygen on heated high flow nasal cannula 50 L flow and respiratory therapist was able  to get down to 45% oxygen 45 L flow. 3/17.  Patient was on 50% oxygen 45 L flow when I saw him today.  I had to do a peer to peer for LTAC approval.  Repeat chest x-ray still showing infiltrates.  Will repeat MRSA PCR which is negative.  Repeat CRP trended down to 3.0.  Procalcitonin negative x 2. 3/18.  Patient accepted to LTAC but LTAC does not have a bed today.  This morning the patient was on heated high flow nasal cannula 43% oxygen and 47% liter flow but looks like this afternoon and is on 50% oxygen 50 L flow.  I resumed care again on 08/07/2023.  Discharge to [facility] is pending an available bed.    Assessment and Plan:  * Acute respiratory failure with hypoxia and hypercapnia (HCC) Remains on HHF oxygen 40% FiO2 with 50 L/min flow.   --Appreciate pulmonary consultation.   --Patient intubated during the hospital course.   --Added inhalers on 3/12.   --Continue prednisone.   --Incentive spirometry and MetaNeb treatments.   --Authorization obtained for LTAC.  Just awaiting available bed. --Repeat CXR again tomorrow AM  Multifocal pneumonia Repeat chest x-ray read as worsening infiltrates.   Patient had Rocephin and Zithromax earlier in the hospital course and recently Unasyn.  Repeat mrsa pcr is negative, procalcitonin negative x 2 and crp trended better to 3  Septic shock (HCC) Present on admission and  now has resolved.  Seizures (HCC) Continue Lamictal  Malnutrition of moderate degree Continue supplements  Gastroesophageal reflux disease PPI to twice daily.  Patient has dilated fluid-filled esophagus which can increase risk of of aspiration.  Patient does not complain of any nausea or vomiting.  Wondering if patient has achalasia.  Requiring too much oxygen for any endoscopy procedure at this point.  Patient's sister told me that he has had hiccups in the past and used to vomit in order to make himself stop hiccuping.  Influenza A with pneumonia On  admission  Epistaxis No recurrence.  Likely from heated high flow nasal cannula prolonged use.  Aspirin and Lovenox discontinued.  AKI (acute kidney injury) (HCC) Resolved.  Creatinine 2.0 on admission and creatinine 0.96 on last check.  Acute metabolic encephalopathy Secondary to septic shock      Subjective: Pt awake resting in bed this AM.  He denies complaints or issues. RT yesterday afternoon requested continuous pulse ox order as she found pt had removed with high-flow nasal cannula.  Physical Exam: Vitals:   08/11/23 0846 08/11/23 0938 08/11/23 1205 08/11/23 1404  BP:   118/69   Pulse:   (!) 110   Resp:   18   Temp:   98.7 F (37.1 C)   TempSrc:   Oral   SpO2: 90% 92% 93% 94%  Weight:      Height:        General exam: awake, alert, no acute distress Respiratory system: diminished breath sounds, normal respiratory effort at rest on HHF O2 50 L/min 50% FiO2 Cardiovascular system: normal S1/S2, RRR Gastrointestinal system: soft, NT, ND, no HSM felt, +bowel sounds. Central nervous system: no gross focal neurologic deficits, normal speech Extremities: moves all, no edema, normal tone Psychiatry: normal mood, flat affect     Data Reviewed:  Notable labs 3/18  -- normal BMP.  Hbg stable 11.8 Procal < 0.10  Family Communication: none. No new medical updates at this time.  Clinical status is unchanged.  Disposition: Status is: Inpatient Remains inpatient appropriate because: Insurance approved LTAC facility but the LTAC does not have a bed.  Planned Discharge Destination: LTAC    Time spent: 25 minutes  Author: Pennie Banter, DO 08/11/2023 2:12 PM  For on call review [email or web address].

## 2023-08-11 NOTE — Plan of Care (Signed)
  Problem: Activity: Goal: Ability to tolerate increased activity will improve Outcome: Progressing   Problem: Respiratory: Goal: Ability to maintain a clear airway and adequate ventilation will improve Outcome: Progressing   Problem: Education: Goal: Knowledge of General Education information will improve Description: Including pain rating scale, medication(s)/side effects and non-pharmacologic comfort measures Outcome: Progressing   Problem: Health Behavior/Discharge Planning: Goal: Ability to manage health-related needs will improve Outcome: Progressing   Problem: Clinical Measurements: Goal: Ability to maintain clinical measurements within normal limits will improve Outcome: Progressing Goal: Will remain free from infection Outcome: Progressing Goal: Diagnostic test results will improve Outcome: Progressing Goal: Respiratory complications will improve Outcome: Progressing Goal: Cardiovascular complication will be avoided Outcome: Progressing   Problem: Coping: Goal: Level of anxiety will decrease Outcome: Progressing   Problem: Safety: Goal: Ability to remain free from injury will improve Outcome: Progressing   Problem: Coping: Goal: Ability to adjust to condition or change in health will improve Outcome: Progressing   Problem: Health Behavior/Discharge Planning: Goal: Ability to identify and utilize available resources and services will improve Outcome: Progressing   Problem: Nutritional: Goal: Maintenance of adequate nutrition will improve Outcome: Progressing Goal: Progress toward achieving an optimal weight will improve Outcome: Progressing

## 2023-08-12 ENCOUNTER — Inpatient Hospital Stay
Admission: EM | Admit: 2023-08-12 | Discharge: 2023-08-27 | Disposition: A | Discharge status: Skilled Nursing Facility | Source: Other Acute Inpatient Hospital | Attending: Internal Medicine | Admitting: Internal Medicine

## 2023-08-12 ENCOUNTER — Encounter: Payer: Self-pay | Admitting: Student in an Organized Health Care Education/Training Program

## 2023-08-12 DIAGNOSIS — J9601 Acute respiratory failure with hypoxia: Secondary | ICD-10-CM | POA: Diagnosis not present

## 2023-08-12 DIAGNOSIS — J9602 Acute respiratory failure with hypercapnia: Secondary | ICD-10-CM | POA: Diagnosis not present

## 2023-08-12 MED ORDER — FLUTICASONE FUROATE-VILANTEROL 100-25 MCG/ACT IN AEPB: 1.0000 | INHALATION_SPRAY | Freq: Every day | RESPIRATORY_TRACT | Status: AC

## 2023-08-12 MED ORDER — NYSTATIN 100000 UNIT/ML MT SUSP: 5.0000 mL | Freq: Four times a day (QID) | OROMUCOSAL | 0 refills | Status: AC

## 2023-08-12 MED ORDER — NICOTINE 14 MG/24HR TD PT24: 14.0000 mg | MEDICATED_PATCH | Freq: Every day | TRANSDERMAL | 0 refills | Status: AC

## 2023-08-12 MED ORDER — SENNA 8.6 MG PO TABS: 2.0000 | ORAL_TABLET | Freq: Every day | ORAL | 0 refills | Status: AC

## 2023-08-12 MED ORDER — VITAMIN B-1 100 MG PO TABS: 100.0000 mg | ORAL_TABLET | Freq: Every day | ORAL | Status: AC

## 2023-08-12 MED ORDER — ENSURE ENLIVE PO LIQD: 237.0000 mL | Freq: Three times a day (TID) | ORAL | Status: AC

## 2023-08-12 MED ORDER — ORAL CARE MOUTH RINSE: 15.0000 mL | OROMUCOSAL | Status: AC | PRN

## 2023-08-12 MED ORDER — TRAZODONE HCL 50 MG PO TABS: 50.0000 mg | ORAL_TABLET | Freq: Every evening | ORAL | Status: AC | PRN

## 2023-08-12 MED ORDER — METOPROLOL SUCCINATE ER 25 MG PO TB24: 25.0000 mg | ORAL_TABLET | Freq: Every day | ORAL | Status: AC

## 2023-08-12 MED ORDER — ONDANSETRON HCL 4 MG/2ML IJ SOLN: 4.0000 mg | Freq: Four times a day (QID) | INTRAMUSCULAR | 0 refills | Status: DC | PRN

## 2023-08-12 MED ORDER — POLYETHYLENE GLYCOL 3350 17 G PO PACK: 17.0000 g | PACK | Freq: Two times a day (BID) | ORAL | 0 refills | Status: AC

## 2023-08-12 MED ORDER — BENZONATATE 100 MG PO CAPS: 100.0000 mg | ORAL_CAPSULE | Freq: Three times a day (TID) | ORAL | Status: AC

## 2023-08-12 MED ORDER — OMEPRAZOLE 40 MG PO CPDR: 40.0000 mg | DELAYED_RELEASE_CAPSULE | Freq: Two times a day (BID) | ORAL | Status: AC

## 2023-08-12 MED ORDER — GUAIFENESIN 100 MG/5ML PO LIQD: 5.0000 mL | ORAL | Status: AC | PRN

## 2023-08-12 MED ORDER — IPRATROPIUM-ALBUTEROL 0.5-2.5 (3) MG/3ML IN SOLN: 3.0000 mL | Freq: Three times a day (TID) | RESPIRATORY_TRACT | Status: AC

## 2023-08-12 MED ORDER — ADULT MULTIVITAMIN W/MINERALS CH: 1.0000 | ORAL_TABLET | Freq: Every day | ORAL | Status: AC

## 2023-08-12 MED ORDER — UMECLIDINIUM BROMIDE 62.5 MCG/ACT IN AEPB: 1.0000 | INHALATION_SPRAY | Freq: Every day | RESPIRATORY_TRACT | Status: AC

## 2023-08-12 MED ORDER — PREDNISONE 10 MG PO TABS: 30.0000 mg | ORAL_TABLET | Freq: Every day | ORAL | Status: DC

## 2023-08-12 MED ORDER — ALBUTEROL SULFATE (2.5 MG/3ML) 0.083% IN NEBU: 2.5000 mg | INHALATION_SOLUTION | RESPIRATORY_TRACT | Status: AC | PRN

## 2023-08-12 MED ORDER — ACETAMINOPHEN 325 MG PO TABS: 650.0000 mg | ORAL_TABLET | ORAL | Status: AC | PRN

## 2023-08-12 NOTE — TOC Transition Note (Addendum)
 Transition of Care [facility]) - Discharge Note   Patient Details  Name: Jeremiah Nguyen MRN: [id number] Date of Birth: [birth year]  [facility] CM/SW Contact:  Truddie Hidden, RN Phone Number: 08/12/2023, 10:48 AM   Clinical Narrative:    Spoke with patient's sister, Darl Pikes to advised of discharge plan. She voiced her concerns about patient not being moved today related to family not being present. Darl Pikes was advised of bed availability and patient being transported today. She was advised of EMS transport for approximately 12pm.  MD, nurse and Consuello Closs from [facility] informed patient family has been updated for discharge plans today.   TOC signing off.            Patient Goals and CMS Choice            Discharge Placement                       Discharge Plan and Services Additional resources added to the After Visit Summary for                                       Social Drivers of Health (SDOH) Interventions SDOH Screenings   Food Insecurity: Patient Unable To Answer (07/13/2023)  Housing: Patient Unable To Answer (07/13/2023)  Transportation Needs: Patient Unable To Answer (07/13/2023)  Utilities: Patient Unable To Answer (07/13/2023)  Depression (PHQ2-9): Low Risk  (02/25/2019)  Financial Resource Strain: Low Risk  (06/30/2018)  Physical Activity: Inactive (06/30/2018)  Social Connections: Patient Unable To Answer (07/13/2023)  Stress: Stress Concern Present (06/30/2018)  Tobacco Use: High Risk (07/12/2023)     Readmission Risk Interventions     No data to display

## 2023-08-12 NOTE — Discharge Summary (Signed)
 Physician Discharge Summary   Patient: Jeremiah DUNNAWAY MRN: [id number] DOB: January 28, 1956  Admit date:     07/12/2023  Discharge date: 08/12/23  Discharge Physician: Pennie Banter   PCP: Armando Gang, FNP   Recommendations at discharge:   Follow up with [facility] providers upon arrival Continue PT/OT at [facility]  Continue to wean oxygen Continue to slowly taper down prednisone  Discharge Diagnoses: Principal Problem:   Acute respiratory failure with hypoxia and hypercapnia (HCC) Active Problems:   Seizures (HCC)   Malnutrition of moderate degree   Gastroesophageal reflux disease  Resolved Problems:   Multifocal pneumonia   Septic shock (HCC)   Influenza A with pneumonia   Epistaxis   Acute metabolic encephalopathy   AKI (acute kidney injury) [facility])  Hospital Course:  68 year old male with a past medical history significant for COPD, alcohol dependence, and seizure disorder who presented to [facility] ED on 07/12/2023 from his group home due to altered mental status.  Per report he has been altered over the last 24 hours.   On arrival to the ED he remained somnolent and altered, oriented only to self.  Given his respiratory distress he was placed on BiPAP and eventually required intubation and levophed.  Positive for influenza A. Started on tamiflu.    07/15/23- patient passed SBT today.  He is liberated from MV on to [facility]. Remains with encephalopathy.  07/16/23- patient is improved overnight. He is on home psychiatric medications now.   3/8 -- weaned to 10 L/min HFNC O2 3/9 -- O2 sats decompensated early AM, transferred to stepdown, back on heated HF o2 at 70-80%FiO2 - resume IV steroids.  Negative RVP, normal BMP, normal LA, CXR neg 3/10 -- remains on heated HF O2 50 L/min 65% FiO2.  Negative CTA chest for PE, but showed new RLL infiltrate, possible and started on Unasyn 3/11 -- 40 L/min 60-65% fiO2 3/12.  Patient on heated high flow nasal cannula 49% oxygen this morning with  40 L flow.  Pulmonary reconsulted. 3/13.  Patient still on 47% oxygen and 40 L flow on heated high flow nasal cannula.  Patient had a nosebleed this morning requiring me to stop aspirin and Lovenox injections and give Afrin nasal spray. 3/14.  No further nosebleed.  Awaiting insurance authorization for LTAC.  Still on heated high flow nasal cannula around 51% oxygen and 40 L flow as of this morning. 3/15.  Patient down to 34% oxygen this am heated high flow nasal cannula when I saw him this morning on 40 L flow. 3/16.  Patient was up to 60% oxygen on heated high flow nasal cannula 50 L flow and respiratory therapist was able to get down to 45% oxygen 45 L flow. 3/17.  Patient was on 50% oxygen 45 L flow when I saw him today.  I had to do a peer to peer for LTAC approval.  Repeat chest x-ray still showing infiltrates.  Will repeat MRSA PCR which is negative.  Repeat CRP trended down to 3.0.  Procalcitonin negative x 2. 3/18.  Patient accepted to LTAC but LTAC does not have a bed today.  This morning the patient was on heated high flow nasal cannula 43% oxygen and 47% liter flow but looks like this afternoon and is on 50% oxygen 50 L flow.   I resumed care again on 08/07/2023.  3/19 >> 3/24:  Patient has remained overall stable, clinical status unchanged.  Pt remains on heated HF oxygen 55 L/min flow ratet  with 50% FiO2, intermittently on BiPAP. Notified this AM [facility] has had a bed become available for patient later today.   3/24--- Patient is stable for discharge to [facility] today.    Assessment and Plan:  Acute respiratory failure with hypoxia and hypercapnia (HCC) Remains on HHF oxygen 40--55% FiO2 with 50--55 L/min flow in most recent week of this admission.   --Appreciate Pulmonary consultation.   --Patient intubated during the hospital course.   --Remains on HHF oxygen today, intermittent BiPAP PRN --Duonebs, Breo & Incruse --Resume PTA Breztri when off scheduled Duonebs --Continue  prednisone -- taper slowly --Incentive spirometry, flutter --Discharge to [facility] for ongoing care   Multifocal pneumonia Completed antibiotics - Unasyn --Mucolytics, antitussives PRN   Septic shock -- Present on admission and resolved.   Seizures - stable, no acute issues --Continue Lamictal   Malnutrition of moderate degree Continue supplements   Gastroesophageal reflux disease Patient has dilated fluid-filled esophagus which can increase risk of of aspiration.   Query if patient has achalasia.   Requiring too much oxygen for any endoscopy procedure at this point.   --PPI increased to to BID --Reflux precautions  History of Hiccups Patient's sister reported to prior attending that he has had hiccups in the past and used to vomit in order to make himself stop hiccuping.  No acute issues with this. --On baclofen and thorazine   Influenza A with pneumonia Presented on admission. Treatment completed.   Epistaxis - resolved No recurrence.  Likely from heated high flow nasal cannula prolonged use.   --Resume aspirin --Hold aspirin if recurrent --Nasal oxygen humidification and/or saline spray   AKI (acute kidney injury) -- Resolved.   Creatinine 2.0 on admission and creatinine 0.96 on 3/18. --Monitor BMP periodically   Acute metabolic encephalopathy - Resolved Secondary to septic shock --Delirium precautions         Consultants: Pulmonology Procedures performed: Intubation/mechanical ventilation   Disposition:  [facility] - [facility]  Diet recommendation:  Discharge Diet Orders (From admission, onward)     Start     Ordered   08/12/23 0000  Diet - Regular       08/12/23 0944            DISCHARGE MEDICATION: Allergies as of 08/12/2023   No Known Allergies      Medication List     PAUSE taking these medications    Breztri Aerosphere 160-9-4.8 MCG/ACT Aero Wait to take this until your doctor or other care provider tells you to start again. Generic drug:  budeson-glycopyrrolate-formoterol Inhale into the lungs.       STOP taking these medications    isosorbide mononitrate 30 MG 24 hr tablet Commonly known as: IMDUR   losartan 25 MG tablet Commonly known as: COZAAR   mirtazapine 15 MG tablet Commonly known as: REMERON   potassium chloride 10 MEQ tablet Commonly known as: KLOR-CON M   Verquvo 10 MG Tabs Generic drug: Vericiguat       TAKE these medications    acetaminophen 325 MG tablet Commonly known as: TYLENOL Take 2 tablets (650 mg total) by mouth every 4 (four) hours as needed for mild pain (pain score 1-3), moderate pain (pain score 4-6), fever or headache.   albuterol (2.5 MG/3ML) 0.083% nebulizer solution Commonly known as: PROVENTIL Take 3 mLs (2.5 mg total) by nebulization every 4 (four) hours as needed for shortness of breath or wheezing.   ascorbic acid 500 MG tablet Commonly known as: VITAMIN C Take 500 mg  by mouth daily.   aspirin 81 MG chewable tablet Chew by mouth daily.   baclofen 10 MG tablet Commonly known as: LIORESAL TAKE ONE TABLET BY MOUTH FOUR TIMES A DAY   benzonatate 100 MG capsule Commonly known as: TESSALON Take 1 capsule (100 mg total) by mouth 3 (three) times daily.   chlorproMAZINE 50 MG tablet Commonly known as: THORAZINE TAKE ONE TABLET BY MOUTH FOUR TIMES A DAY   empagliflozin 25 MG Tabs tablet Commonly known as: JARDIANCE Take 25 mg by mouth daily.   feeding supplement Liqd Take 237 mLs by mouth 3 (three) times daily between meals.   ferrous sulfate 325 (65 FE) MG EC tablet Take 1 tablet (325 mg total) by mouth every other day.   fluticasone furoate-vilanterol 100-25 MCG/ACT Aepb Commonly known as: BREO ELLIPTA Inhale 1 puff into the lungs daily. Start taking on: August 13, 2023   folic acid 1 MG tablet Commonly known as: FOLVITE Take 1 mg by mouth daily.   guaiFENesin 100 MG/5ML liquid Commonly known as: ROBITUSSIN Take 5 mLs by mouth every 4 (four) hours as  needed for cough or to loosen phlegm.   ipratropium-albuterol 0.5-2.5 (3) MG/3ML Soln Commonly known as: DUONEB Take 3 mLs by nebulization 3 (three) times daily.   lamoTRIgine 150 MG tablet Commonly known as: LAMICTAL Take 150 mg by mouth 2 (two) times daily.   metoprolol succinate 25 MG 24 hr tablet Commonly known as: TOPROL-XL Take 1 tablet (25 mg total) by mouth daily. Start taking on: August 13, 2023   mouth rinse Liqd solution 15 mLs by Mouth Rinse route as needed (for oral care).   multivitamin with minerals Tabs tablet Take 1 tablet by mouth daily. Start taking on: August 13, 2023   nicotine 14 mg/24hr patch Commonly known as: NICODERM CQ - dosed in mg/24 hours Place 1 patch (14 mg total) onto the skin daily. Start taking on: August 13, 2023   nystatin 100000 UNIT/ML suspension Commonly known as: MYCOSTATIN Take 5 mLs (500,000 Units total) by mouth 4 (four) times daily.   omeprazole 40 MG capsule Commonly known as: PRILOSEC Take 1 capsule (40 mg total) by mouth 2 (two) times daily with a meal. What changed: See the new instructions.   ondansetron 4 MG/2ML Soln injection Commonly known as: ZOFRAN Inject 2 mLs (4 mg total) into the vein every 6 (six) hours as needed for vomiting or nausea.   polyethylene glycol 17 g packet Commonly known as: MIRALAX / GLYCOLAX Take 17 g by mouth 2 (two) times daily.   predniSONE 10 MG tablet Commonly known as: DELTASONE Take 3 tablets (30 mg total) by mouth daily with breakfast. Start taking on: August 13, 2023   senna 8.6 MG Tabs tablet Commonly known as: SENOKOT Take 2 tablets (17.2 mg total) by mouth at bedtime. What changed: how much to take   sertraline 25 MG tablet Commonly known as: ZOLOFT Take 1 tablet (25 mg total) by mouth daily.   thiamine 100 MG tablet Commonly known as: Vitamin B-1 Take 1 tablet (100 mg total) by mouth daily. Start taking on: August 13, 2023   traZODone 50 MG tablet Commonly known as:  DESYREL Take 1 tablet (50 mg total) by mouth at bedtime as needed for sleep.   umeclidinium bromide 62.5 MCG/ACT Aepb Commonly known as: INCRUSE ELLIPTA Inhale 1 puff into the lungs daily. Start taking on: August 13, 2023   Vitamin D3 1.25 MG (50000 UT) Caps Take 50,000 Units by  mouth once a week.        Discharge Exam: Filed Weights   08/10/23 0541 08/11/23 0502 08/12/23 0705  Weight: 67.1 kg 65.7 kg 64.5 kg   General exam: awake, alert, no acute distress Respiratory system: CTAB but diminished, no wheezes or rhonchi, normal respiratory effort at rest on HHF O2 55 L/min 50% FiO2 Cardiovascular system: normal S1/S2, RRR, no JVD, murmurs, rubs, gallops, no pedal edema.   Gastrointestinal system: soft, NT, ND, no HSM felt, +bowel sounds. Central nervous system: A&O x2+. no gross focal neurologic deficits, normal speech Extremities: moves all, no edema, normal tone Skin: dry, intact, normal temperature Psychiatry: normal mood, congruent affect   Condition at discharge: stable  The results of significant diagnostics from this hospitalization (including imaging, microbiology, ancillary and laboratory) are listed below for reference.   Imaging Studies: DG Chest Port 1 View Result Date: 08/11/2023 CLINICAL DATA:  Acute on chronic respiratory failure with hypoxia EXAM: PORTABLE CHEST 1 VIEW COMPARISON:  Chest radiograph dated 08/05/2023 FINDINGS: Normal lung volumes. Interval increased patchy left mid lung and confluent left lower lung opacities. Blunting of the left costophrenic angle. No pneumothorax. The heart size and mediastinal contours are within normal limits. No acute osseous abnormality. IMPRESSION: 1. Interval increased patchy left mid lung and confluent left lower lung opacities, suspicious for pneumonia. 2. Blunting of the left costophrenic angle may reflect trace pleural effusion. Electronically Signed   By: Agustin Cree M.D.   On: 08/11/2023 08:59   DG Chest Port 1  View Result Date: 08/05/2023 CLINICAL DATA:  Hypoxia EXAM: PORTABLE CHEST 1 VIEW COMPARISON:  X-ray 07/28/2023 and older. CTA 07/29/2023. Older exams as well. FINDINGS: Hyperinflation with chronic lung changes. No pneumothorax or effusion. Normal cardiopericardial silhouette without edema. Calcified tortuous aorta. There is some increasing lung base opacity seen, left greater than right. Overlapping cardiac leads. IMPRESSION: Lung base opacities identified, left greater than right. Increasing from previous. Recommend follow-up Electronically Signed   By: Karen Kays M.D.   On: 08/05/2023 14:47   US Venous Img Lower Bilateral (DVT) Result Date: 07/29/2023 CLINICAL DATA:  Positive D-dimer. EXAM: BILATERAL LOWER EXTREMITY VENOUS DOPPLER ULTRASOUND TECHNIQUE: Gray-scale sonography with compression, as well as color and duplex ultrasound, were performed to evaluate the deep venous system(s) from the level of the common femoral vein through the popliteal and proximal calf veins. COMPARISON:  None Available. FINDINGS: VENOUS Normal compressibility of the common femoral, superficial femoral, and popliteal veins, as well as the visualized calf veins. Visualized portions of profunda femoral vein and great saphenous vein unremarkable. No filling defects to suggest DVT on grayscale or color Doppler imaging. Doppler waveforms show normal direction of venous flow, normal respiratory plasticity and response to augmentation. OTHER None. Limitations: none IMPRESSION: No evidence of bilateral lower extremity DVT. Electronically Signed   By: Narda Rutherford M.D.   On: 07/29/2023 19:40   CT Angio Chest Pulmonary Embolism (PE) W or WO Contrast Result Date: 07/29/2023 CLINICAL DATA:  Pulmonary embolism (PE) suspected, high prob Respiratory distress. EXAM: CT ANGIOGRAPHY CHEST WITH CONTRAST TECHNIQUE: Multidetector CT imaging of the chest was performed using the standard protocol during bolus administration of intravenous  contrast. Multiplanar CT image reconstructions and MIPs were obtained to evaluate the vascular anatomy. RADIATION DOSE REDUCTION: This exam was performed according to the departmental dose-optimization program which includes automated exposure control, adjustment of the mA and/or kV according to patient size and/or use of iterative reconstruction technique. CONTRAST:  75mL OMNIPAQUE IOHEXOL 350 MG/ML  SOLN COMPARISON:  Radiograph yesterday.  Chest CTA 07/12/2023 FINDINGS: Cardiovascular: Basilar assessment is limited by motion. Allowing for this, no filling defects in the pulmonary arteries to suggest pulmonary embolus. Aortic atherosclerosis without aneurysm or acute aortic findings. The heart is upper normal in size. There are coronary artery calcifications. No pericardial effusion. Mediastinum/Nodes: The esophagus is diffusely dilated, fluid-filled with wall thickening. Shotty mediastinal and hilar lymph nodes, likely reactive, similar to prior exam. Lungs/Pleura: Moderate emphysema. Stable bleb in the medial right middle lobe. Dependent atelectasis is improved from last month. There are faint tree-in-bud opacities in the right lower lobe. No debris in the endobronchial tree. No pleural effusion. 4 mm right apical pulmonary nodule, series 6, image 19, is unchanged from 10/04/2021 exam and considered definitively benign. Upper Abdomen: No acute findings. Moderate distention of the stomach with ingested material. Musculoskeletal: There are no acute or suspicious osseous abnormalities. Review of the MIP images confirms the above findings. IMPRESSION: 1. No pulmonary embolus. 2. Faint tree-in-bud opacities in the right lower lobe, likely infectious/inflammatory. Aspiration is considered. Bilateral lower lobe atelectasis has improved from CT last month. 3. Diffusely dilated, fluid-filled esophagus with wall thickening, suggesting esophagitis/dysmotility. 4. Coronary artery calcifications. Aortic Atherosclerosis  (ICD10-I70.0) and Emphysema (ICD10-J43.9). Electronically Signed   By: Narda Rutherford M.D.   On: 07/29/2023 19:39   DG Chest Port 1 View Result Date: 07/28/2023 CLINICAL DATA:  68 year old male with hypoxia. EXAM: PORTABLE CHEST 1 VIEW COMPARISON:  Portable chest 07/12/2023 and earlier. FINDINGS: Portable AP semi upright view at 0640 hours. Extubated since the February comparison. Other lines and tubes removed. Emphysema demonstrated on CTA that month. Improved but unresolved streaky and confluent left retrocardiac peribronchial opacity. No superimposed pneumothorax or pleural effusion. No other confluent opacity. No acute osseous abnormality identified. Paucity of bowel gas in the visible abdomen. IMPRESSION: Emphysema ([id number]) with confluent left lung base peribronchial opacity which could be regression of that seen last month or recurrent Bronchopneumonia. Electronically Signed   By: Odessa Fleming M.D.   On: 07/28/2023 07:51    Microbiology: Results for orders placed or performed during the hospital encounter of 07/12/23  Resp panel by RT-PCR (RSV, Flu A&B, Covid) Anterior Nasal Swab     Status: Abnormal   Collection Time: 07/12/23  8:12 AM   Specimen: Anterior Nasal Swab  Result Value Ref Range Status   SARS Coronavirus 2 by RT PCR NEGATIVE NEGATIVE Final    Comment: (NOTE) SARS-CoV-2 target nucleic acids are NOT DETECTED.  The SARS-CoV-2 RNA is generally detectable in upper respiratory specimens during the acute phase of infection. The lowest concentration of SARS-CoV-2 viral copies this assay can detect is 138 copies/mL. A negative result does not preclude SARS-Cov-2 infection and should not be used as the sole basis for treatment or other patient management decisions. A negative result may occur with  improper specimen collection/handling, submission of specimen other than nasopharyngeal swab, presence of viral mutation(s) within the areas targeted by this assay, and inadequate number  of viral copies(<138 copies/mL). A negative result must be combined with clinical observations, patient history, and epidemiological information. The expected result is Negative.  Fact Sheet for Patients:  BloggerCourse.com  Fact Sheet for Healthcare Providers:  SeriousBroker.it  This test is no t yet approved or cleared by the [place] FDA and  has been authorized for detection and/or diagnosis of SARS-CoV-2 by FDA under an Emergency Use Authorization (EUA). This EUA will remain  in effect (meaning this test can be used) for  the duration of the COVID-19 declaration under Section 564(b)(1) of the Act, 21 U.S.C.section 360bbb-3(b)(1), unless the authorization is terminated  or revoked sooner.       Influenza A by PCR POSITIVE (A) NEGATIVE Final   Influenza B by PCR NEGATIVE NEGATIVE Final    Comment: (NOTE) The Xpert Xpress SARS-CoV-2/FLU/RSV plus assay is intended as an aid in the diagnosis of influenza from Nasopharyngeal swab specimens and should not be used as a sole basis for treatment. Nasal washings and aspirates are unacceptable for Xpert Xpress SARS-CoV-2/FLU/RSV testing.  Fact Sheet for Patients: BloggerCourse.com  Fact Sheet for Healthcare Providers: SeriousBroker.it  This test is not yet approved or cleared by the [place] FDA and has been authorized for detection and/or diagnosis of SARS-CoV-2 by FDA under an Emergency Use Authorization (EUA). This EUA will remain in effect (meaning this test can be used) for the duration of the COVID-19 declaration under Section 564(b)(1) of the Act, 21 U.S.C. section 360bbb-3(b)(1), unless the authorization is terminated or revoked.     Resp Syncytial Virus by PCR NEGATIVE NEGATIVE Final    Comment: (NOTE) Fact Sheet for Patients: BloggerCourse.com  Fact Sheet for Healthcare  Providers: SeriousBroker.it  This test is not yet approved or cleared by the [place] FDA and has been authorized for detection and/or diagnosis of SARS-CoV-2 by FDA under an Emergency Use Authorization (EUA). This EUA will remain in effect (meaning this test can be used) for the duration of the COVID-19 declaration under Section 564(b)(1) of the Act, 21 U.S.C. section 360bbb-3(b)(1), unless the authorization is terminated or revoked.  Performed at [facility], [address] [place], [place] [address]   Blood Culture (routine x 2)     Status: None   Collection Time: 07/12/23  8:12 AM   Specimen: BLOOD  Result Value Ref Range Status   Specimen Description BLOOD BLOOD LEFT FOREARM  Final   Special Requests   Final    BOTTLES DRAWN AEROBIC ONLY Blood Culture results may not be optimal due to an inadequate volume of blood received in culture bottles   Culture   Final    NO GROWTH 5 DAYS Performed at [facility], [address] [place], [place] [address]    Report Status 07/17/2023 FINAL  Final  Blood Culture (routine x 2)     Status: None   Collection Time: 07/12/23  8:12 AM   Specimen: BLOOD  Result Value Ref Range Status   Specimen Description BLOOD BLOOD LEFT ARM  Final   Special Requests   Final    BOTTLES DRAWN AEROBIC ONLY Blood Culture results may not be optimal due to an inadequate volume of blood received in culture bottles   Culture   Final    NO GROWTH 5 DAYS Performed at [facility], [address], [place], [place] [address]    Report Status 07/17/2023 FINAL  Final  MRSA Next Gen by PCR, Nasal     Status: None   Collection Time: 07/12/23  2:47 PM   Specimen: Nasal Mucosa; Nasal Swab  Result Value Ref Range Status   MRSA by PCR Next Gen NOT DETECTED NOT DETECTED Final    Comment: (NOTE) The GeneXpert MRSA Assay (FDA approved for NASAL specimens only), is one component of a comprehensive MRSA  colonization surveillance program. It is not intended to diagnose MRSA infection nor to guide or monitor treatment for MRSA infections. Test performance is not FDA approved in patients less than 2 years  old. Performed at [facility], [address], [place], [place] [address]   Culture, Respiratory w Gram Stain     Status: None   Collection Time: 07/14/23 11:25 AM   Specimen: Tracheal Aspirate; Respiratory  Result Value Ref Range Status   Specimen Description   Final    TRACHEAL ASPIRATE Performed at [facility], [address] [place], [place] [address]    Special Requests   Final    NONE Performed at [facility], [address] [place], [place] [address]    Gram Stain   Final    RARE WBC SEEN RARE SQUAMOUS EPITHELIAL CELLS PRESENT FEW YEAST RARE GRAM POSITIVE RODS Performed at [facility], [address], [place], [place] [address]    Culture FEW CANDIDA ALBICANS  Final   Report Status 07/16/2023 FINAL  Final  Respiratory (~20 pathogens) panel by PCR     Status: None   Collection Time: 07/28/23 10:39 AM   Specimen: Nasopharyngeal Swab; Respiratory  Result Value Ref Range Status   Adenovirus NOT DETECTED NOT DETECTED Final   Coronavirus 229E NOT DETECTED NOT DETECTED Final    Comment: (NOTE) The Coronavirus on the Respiratory Panel, DOES NOT test for the novel  Coronavirus (2019 nCoV)    Coronavirus HKU1 NOT DETECTED NOT DETECTED Final   Coronavirus NL63 NOT DETECTED NOT DETECTED Final   Coronavirus OC43 NOT DETECTED NOT DETECTED Final   Metapneumovirus NOT DETECTED NOT DETECTED Final   Rhinovirus / Enterovirus NOT DETECTED NOT DETECTED Final   Influenza A NOT DETECTED NOT DETECTED Final   Influenza B NOT DETECTED NOT DETECTED Final   Parainfluenza Virus 1 NOT DETECTED NOT DETECTED Final   Parainfluenza Virus 2 NOT DETECTED NOT DETECTED Final   Parainfluenza Virus 3 NOT DETECTED NOT DETECTED Final   Parainfluenza Virus 4 NOT  DETECTED NOT DETECTED Final   Respiratory Syncytial Virus NOT DETECTED NOT DETECTED Final   Bordetella pertussis NOT DETECTED NOT DETECTED Final   Bordetella Parapertussis NOT DETECTED NOT DETECTED Final   Chlamydophila pneumoniae NOT DETECTED NOT DETECTED Final   Mycoplasma pneumoniae NOT DETECTED NOT DETECTED Final    Comment: Performed at [facility], [address], [place], [place] [address]  SARS Coronavirus 2 by RT PCR (hospital order, performed in [facility] hospital lab) *cepheid single result test* Anterior Nasal Swab     Status: None   Collection Time: 07/28/23 10:39 AM   Specimen: Anterior Nasal Swab  Result Value Ref Range Status   SARS Coronavirus 2 by RT PCR NEGATIVE NEGATIVE Final    Comment: (NOTE) SARS-CoV-2 target nucleic acids are NOT DETECTED.  The SARS-CoV-2 RNA is generally detectable in upper and lower respiratory specimens during the acute phase of infection. The lowest concentration of SARS-CoV-2 viral copies this assay can detect is 250 copies / mL. A negative result does not preclude SARS-CoV-2 infection and should not be used as the sole basis for treatment or other patient management decisions.  A negative result may occur with improper specimen collection / handling, submission of specimen other than nasopharyngeal swab, presence of viral mutation(s) within the areas targeted by this assay, and inadequate number of viral copies (<250 copies / mL). A negative result must be combined with clinical observations, patient history, and epidemiological information.  Fact Sheet for Patients:   RoadLapTop.co.za  Fact Sheet for Healthcare Providers: http://kim-miller.com/  This test is not yet approved or  cleared by the [place] and  has been authorized for detection and/or diagnosis of SARS-CoV-2 by FDA under an Emergency Use Authorization (EUA).  This EUA will remain in effect (meaning this test  can be used) for the duration of the COVID-19 declaration under Section 564(b)(1) of the Act, 21 U.S.C. section 360bbb-3(b)(1), unless the authorization is terminated or revoked sooner.  Performed at [facility], [address] [place], [place] [address]   MRSA Next Gen by PCR, Nasal     Status: None   Collection Time: 08/05/23  5:30 PM   Specimen: Nasal Mucosa; Nasal Swab  Result Value Ref Range Status   MRSA by PCR Next Gen NOT DETECTED NOT DETECTED Final    Comment: (NOTE) The GeneXpert MRSA Assay (FDA approved for NASAL specimens only), is one component of a comprehensive MRSA colonization surveillance program. It is not intended to diagnose MRSA infection nor to guide or monitor treatment for MRSA infections. Test performance is not FDA approved in patients less than 78 years old. Performed at [facility], [address] [place], [place] [address]     Labs: CBC: Recent Labs  Lab 08/06/23 0435  WBC 10.1  HGB 11.8*  HCT 35.7*  MCV 89.7  PLT 260   Basic Metabolic Panel: Recent Labs  Lab 08/06/23 0435  NA 139  K 3.6  CL 102  CO2 30  GLUCOSE 82  BUN 19  CREATININE 0.96  CALCIUM 9.1   Liver Function Tests: No results for input(s): "AST", "ALT", "ALKPHOS", "BILITOT", "PROT", "ALBUMIN" in the last 168 hours. CBG: No results for input(s): "GLUCAP" in the last 168 hours.  Discharge time spent: greater than 30 minutes.  Signed: Pennie Banter, DO [facility] 08/12/2023

## 2023-08-13 ENCOUNTER — Other Ambulatory Visit (HOSPITAL_COMMUNITY)

## 2023-08-13 ENCOUNTER — Ambulatory Visit: Payer: 59 | Admitting: Cardiovascular Disease

## 2023-08-13 DIAGNOSIS — J449 Chronic obstructive pulmonary disease, unspecified: Secondary | ICD-10-CM

## 2023-08-13 DIAGNOSIS — J9621 Acute and chronic respiratory failure with hypoxia: Secondary | ICD-10-CM

## 2023-08-13 DIAGNOSIS — J181 Lobar pneumonia, unspecified organism: Secondary | ICD-10-CM

## 2023-08-13 DIAGNOSIS — G40909 Epilepsy, unspecified, not intractable, without status epilepticus: Secondary | ICD-10-CM

## 2023-08-13 LAB — COMPREHENSIVE METABOLIC PANEL
ALT: 22 U/L (ref 0–44)
AST: 17 U/L (ref 15–41)
Albumin: 2.6 g/dL — ABNORMAL LOW (ref 3.5–5.0)
Alkaline Phosphatase: 65 U/L (ref 38–126)
Anion gap: 7 (ref 5–15)
BUN: 17 mg/dL (ref 8–23)
CO2: 36 mmol/L — ABNORMAL HIGH (ref 22–32)
Calcium: 9.6 mg/dL (ref 8.9–10.3)
Chloride: 99 mmol/L (ref 98–111)
Creatinine, Ser: 1 mg/dL (ref 0.61–1.24)
GFR, Estimated: >60 mL/min (ref >60)
Glucose, Bld: 106 mg/dL — ABNORMAL HIGH (ref 70–99)
Potassium: 4 mmol/L (ref 3.5–5.1)
Sodium: 142 mmol/L (ref 135–145)
Total Bilirubin: 0.4 mg/dL (ref 0.0–1.2)
Total Protein: 5.4 g/dL — ABNORMAL LOW (ref 6.5–8.1)

## 2023-08-13 LAB — CBC
HCT: 37.6 % — ABNORMAL LOW (ref 39.0–52.0)
Hemoglobin: 12.3 g/dL — ABNORMAL LOW (ref 13.0–17.0)
MCH: 29.9 pg (ref 26.0–34.0)
MCHC: 32.7 g/dL (ref 30.0–36.0)
MCV: 91.3 fL (ref 80.0–100.0)
Platelets: 238 10*3/uL (ref 150–400)
RBC: 4.12 MIL/uL — ABNORMAL LOW (ref 4.22–5.81)
RDW: 15.8 % — ABNORMAL HIGH (ref 11.5–15.5)
WBC: 14.5 10*3/uL — ABNORMAL HIGH (ref 4.0–10.5)
nRBC: 0 % (ref 0.0–0.2)

## 2023-08-14 DIAGNOSIS — J181 Lobar pneumonia, unspecified organism: Secondary | ICD-10-CM

## 2023-08-14 DIAGNOSIS — G40909 Epilepsy, unspecified, not intractable, without status epilepticus: Secondary | ICD-10-CM

## 2023-08-14 DIAGNOSIS — J9621 Acute and chronic respiratory failure with hypoxia: Secondary | ICD-10-CM

## 2023-08-14 DIAGNOSIS — J449 Chronic obstructive pulmonary disease, unspecified: Secondary | ICD-10-CM

## 2023-08-15 DIAGNOSIS — J449 Chronic obstructive pulmonary disease, unspecified: Secondary | ICD-10-CM

## 2023-08-15 DIAGNOSIS — J9621 Acute and chronic respiratory failure with hypoxia: Secondary | ICD-10-CM

## 2023-08-15 DIAGNOSIS — G40909 Epilepsy, unspecified, not intractable, without status epilepticus: Secondary | ICD-10-CM

## 2023-08-15 DIAGNOSIS — J181 Lobar pneumonia, unspecified organism: Secondary | ICD-10-CM

## 2023-08-16 DIAGNOSIS — J449 Chronic obstructive pulmonary disease, unspecified: Secondary | ICD-10-CM

## 2023-08-16 DIAGNOSIS — G40909 Epilepsy, unspecified, not intractable, without status epilepticus: Secondary | ICD-10-CM

## 2023-08-16 DIAGNOSIS — J181 Lobar pneumonia, unspecified organism: Secondary | ICD-10-CM

## 2023-08-16 DIAGNOSIS — J9621 Acute and chronic respiratory failure with hypoxia: Secondary | ICD-10-CM

## 2023-08-16 LAB — BASIC METABOLIC PANEL WITH GFR
Anion gap: 10 (ref 5–15)
BUN: 19 mg/dL (ref 8–23)
CO2: 30 mmol/L (ref 22–32)
Calcium: 9.2 mg/dL (ref 8.9–10.3)
Chloride: 100 mmol/L (ref 98–111)
Creatinine, Ser: 1.05 mg/dL (ref 0.61–1.24)
GFR, Estimated: >60 mL/min (ref >60)
Glucose, Bld: 95 mg/dL (ref 70–99)
Potassium: 3.8 mmol/L (ref 3.5–5.1)
Sodium: 140 mmol/L (ref 135–145)

## 2023-08-16 LAB — CBC
HCT: 38.3 % — ABNORMAL LOW (ref 39.0–52.0)
Hemoglobin: 12.7 g/dL — ABNORMAL LOW (ref 13.0–17.0)
MCH: 30.2 pg (ref 26.0–34.0)
MCHC: 33.2 g/dL (ref 30.0–36.0)
MCV: 91 fL (ref 80.0–100.0)
Platelets: 224 10*3/uL (ref 150–400)
RBC: 4.21 MIL/uL — ABNORMAL LOW (ref 4.22–5.81)
RDW: 15.8 % — ABNORMAL HIGH (ref 11.5–15.5)
WBC: 6.6 10*3/uL (ref 4.0–10.5)
nRBC: 0 % (ref 0.0–0.2)

## 2023-08-16 LAB — MAGNESIUM: Magnesium: 1.9 mg/dL (ref 1.7–2.4)

## 2023-08-17 DIAGNOSIS — G40909 Epilepsy, unspecified, not intractable, without status epilepticus: Secondary | ICD-10-CM

## 2023-08-17 DIAGNOSIS — J181 Lobar pneumonia, unspecified organism: Secondary | ICD-10-CM

## 2023-08-17 DIAGNOSIS — J449 Chronic obstructive pulmonary disease, unspecified: Secondary | ICD-10-CM

## 2023-08-17 DIAGNOSIS — J9621 Acute and chronic respiratory failure with hypoxia: Secondary | ICD-10-CM

## 2023-08-18 DIAGNOSIS — J181 Lobar pneumonia, unspecified organism: Secondary | ICD-10-CM

## 2023-08-18 DIAGNOSIS — J449 Chronic obstructive pulmonary disease, unspecified: Secondary | ICD-10-CM

## 2023-08-18 DIAGNOSIS — G40909 Epilepsy, unspecified, not intractable, without status epilepticus: Secondary | ICD-10-CM

## 2023-08-18 DIAGNOSIS — J9621 Acute and chronic respiratory failure with hypoxia: Secondary | ICD-10-CM

## 2023-08-19 DIAGNOSIS — G40909 Epilepsy, unspecified, not intractable, without status epilepticus: Secondary | ICD-10-CM

## 2023-08-19 DIAGNOSIS — J449 Chronic obstructive pulmonary disease, unspecified: Secondary | ICD-10-CM

## 2023-08-19 DIAGNOSIS — J9621 Acute and chronic respiratory failure with hypoxia: Secondary | ICD-10-CM

## 2023-08-19 DIAGNOSIS — J181 Lobar pneumonia, unspecified organism: Secondary | ICD-10-CM

## 2023-08-19 LAB — CBC WITH DIFFERENTIAL/PLATELET
Abs Immature Granulocytes: 0.04 10*3/uL (ref 0.00–0.07)
Basophils Absolute: 0.1 10*3/uL (ref 0.0–0.1)
Basophils Relative: 1 %
Eosinophils Absolute: 0.1 10*3/uL (ref 0.0–0.5)
Eosinophils Relative: 2 %
HCT: 39.5 % (ref 39.0–52.0)
Hemoglobin: 12.9 g/dL — ABNORMAL LOW (ref 13.0–17.0)
Immature Granulocytes: 1 %
Lymphocytes Relative: 28 %
Lymphs Abs: 1.8 10*3/uL (ref 0.7–4.0)
MCH: 29.4 pg (ref 26.0–34.0)
MCHC: 32.7 g/dL (ref 30.0–36.0)
MCV: 90 fL (ref 80.0–100.0)
Monocytes Absolute: 0.7 10*3/uL (ref 0.1–1.0)
Monocytes Relative: 11 %
Neutro Abs: 3.7 10*3/uL (ref 1.7–7.7)
Neutrophils Relative %: 57 %
Platelets: 229 10*3/uL (ref 150–400)
RBC: 4.39 MIL/uL (ref 4.22–5.81)
RDW: 15.8 % — ABNORMAL HIGH (ref 11.5–15.5)
WBC: 6.5 10*3/uL (ref 4.0–10.5)
nRBC: 0 % (ref 0.0–0.2)

## 2023-08-19 LAB — BASIC METABOLIC PANEL WITH GFR
Anion gap: 9 (ref 5–15)
BUN: 17 mg/dL (ref 8–23)
CO2: 29 mmol/L (ref 22–32)
Calcium: 9.6 mg/dL (ref 8.9–10.3)
Chloride: 103 mmol/L (ref 98–111)
Creatinine, Ser: 0.99 mg/dL (ref 0.61–1.24)
GFR, Estimated: >60 mL/min (ref >60)
Glucose, Bld: 116 mg/dL — ABNORMAL HIGH (ref 70–99)
Potassium: 3.9 mmol/L (ref 3.5–5.1)
Sodium: 141 mmol/L (ref 135–145)

## 2023-08-20 DIAGNOSIS — J181 Lobar pneumonia, unspecified organism: Secondary | ICD-10-CM

## 2023-08-20 DIAGNOSIS — J449 Chronic obstructive pulmonary disease, unspecified: Secondary | ICD-10-CM

## 2023-08-20 DIAGNOSIS — J9621 Acute and chronic respiratory failure with hypoxia: Secondary | ICD-10-CM

## 2023-08-20 DIAGNOSIS — G40909 Epilepsy, unspecified, not intractable, without status epilepticus: Secondary | ICD-10-CM

## 2023-08-21 DIAGNOSIS — G40909 Epilepsy, unspecified, not intractable, without status epilepticus: Secondary | ICD-10-CM

## 2023-08-21 DIAGNOSIS — J181 Lobar pneumonia, unspecified organism: Secondary | ICD-10-CM

## 2023-08-21 DIAGNOSIS — J9621 Acute and chronic respiratory failure with hypoxia: Secondary | ICD-10-CM

## 2023-08-21 DIAGNOSIS — J449 Chronic obstructive pulmonary disease, unspecified: Secondary | ICD-10-CM

## 2023-08-22 DIAGNOSIS — J449 Chronic obstructive pulmonary disease, unspecified: Secondary | ICD-10-CM

## 2023-08-22 DIAGNOSIS — J9621 Acute and chronic respiratory failure with hypoxia: Secondary | ICD-10-CM

## 2023-08-22 DIAGNOSIS — J181 Lobar pneumonia, unspecified organism: Secondary | ICD-10-CM

## 2023-08-22 DIAGNOSIS — G40909 Epilepsy, unspecified, not intractable, without status epilepticus: Secondary | ICD-10-CM

## 2023-08-23 DIAGNOSIS — J449 Chronic obstructive pulmonary disease, unspecified: Secondary | ICD-10-CM

## 2023-08-23 DIAGNOSIS — J9621 Acute and chronic respiratory failure with hypoxia: Secondary | ICD-10-CM

## 2023-08-23 DIAGNOSIS — J181 Lobar pneumonia, unspecified organism: Secondary | ICD-10-CM

## 2023-08-23 DIAGNOSIS — G40909 Epilepsy, unspecified, not intractable, without status epilepticus: Secondary | ICD-10-CM

## 2023-08-24 LAB — CBC
HCT: 37.6 % — ABNORMAL LOW (ref 39.0–52.0)
Hemoglobin: 12.4 g/dL — ABNORMAL LOW (ref 13.0–17.0)
MCH: 29.7 pg (ref 26.0–34.0)
MCHC: 33 g/dL (ref 30.0–36.0)
MCV: 90 fL (ref 80.0–100.0)
Platelets: 262 10*3/uL (ref 150–400)
RBC: 4.18 MIL/uL — ABNORMAL LOW (ref 4.22–5.81)
RDW: 16.1 % — ABNORMAL HIGH (ref 11.5–15.5)
WBC: 5.6 10*3/uL (ref 4.0–10.5)
nRBC: 0 % (ref 0.0–0.2)

## 2023-08-24 LAB — BASIC METABOLIC PANEL WITH GFR
Anion gap: 10 (ref 5–15)
BUN: 13 mg/dL (ref 8–23)
CO2: 28 mmol/L (ref 22–32)
Calcium: 9.7 mg/dL (ref 8.9–10.3)
Chloride: 104 mmol/L (ref 98–111)
Creatinine, Ser: 1.1 mg/dL (ref 0.61–1.24)
GFR, Estimated: >60 mL/min (ref >60)
Glucose, Bld: 106 mg/dL — ABNORMAL HIGH (ref 70–99)
Potassium: 3.7 mmol/L (ref 3.5–5.1)
Sodium: 142 mmol/L (ref 135–145)

## 2023-08-24 LAB — MAGNESIUM: Magnesium: 1.9 mg/dL (ref 1.7–2.4)

## 2023-08-25 LAB — BLOOD GAS, ARTERIAL
Acid-Base Excess: 6.6 mmol/L — ABNORMAL HIGH (ref 0.0–2.0)
Bicarbonate: 31.3 mmol/L — ABNORMAL HIGH (ref 20.0–28.0)
O2 Saturation: 95.8 %
Patient temperature: 37.1
pCO2 arterial: 44 mmHg (ref 32–48)
pH, Arterial: 7.46 — ABNORMAL HIGH (ref 7.35–7.45)
pO2, Arterial: 61 mmHg — ABNORMAL LOW (ref 83–108)

## 2023-09-13 ENCOUNTER — Emergency Department

## 2023-09-13 ENCOUNTER — Inpatient Hospital Stay
Admission: EM | Admit: 2023-09-13 | Discharge: 2023-09-26 | DRG: 177 | Disposition: A | Discharge status: Home Health | Attending: Student | Admitting: Student

## 2023-09-13 ENCOUNTER — Other Ambulatory Visit: Payer: Self-pay

## 2023-09-13 DIAGNOSIS — Z7951 Long term (current) use of inhaled steroids: Secondary | ICD-10-CM

## 2023-09-13 DIAGNOSIS — F17210 Nicotine dependence, cigarettes, uncomplicated: Secondary | ICD-10-CM | POA: Diagnosis present

## 2023-09-13 DIAGNOSIS — J439 Emphysema, unspecified: Secondary | ICD-10-CM | POA: Diagnosis present

## 2023-09-13 DIAGNOSIS — R569 Unspecified convulsions: Secondary | ICD-10-CM

## 2023-09-13 DIAGNOSIS — G4730 Sleep apnea, unspecified: Secondary | ICD-10-CM | POA: Diagnosis present

## 2023-09-13 DIAGNOSIS — G40909 Epilepsy, unspecified, not intractable, without status epilepticus: Secondary | ICD-10-CM | POA: Diagnosis present

## 2023-09-13 DIAGNOSIS — R1310 Dysphagia, unspecified: Secondary | ICD-10-CM | POA: Diagnosis present

## 2023-09-13 DIAGNOSIS — Z7982 Long term (current) use of aspirin: Secondary | ICD-10-CM | POA: Diagnosis not present

## 2023-09-13 DIAGNOSIS — R0602 Shortness of breath: Principal | ICD-10-CM

## 2023-09-13 DIAGNOSIS — Z1152 Encounter for screening for COVID-19: Secondary | ICD-10-CM

## 2023-09-13 DIAGNOSIS — R5381 Other malaise: Secondary | ICD-10-CM | POA: Diagnosis present

## 2023-09-13 DIAGNOSIS — F39 Unspecified mood [affective] disorder: Secondary | ICD-10-CM | POA: Diagnosis present

## 2023-09-13 DIAGNOSIS — Z7984 Long term (current) use of oral hypoglycemic drugs: Secondary | ICD-10-CM | POA: Diagnosis not present

## 2023-09-13 DIAGNOSIS — F1010 Alcohol abuse, uncomplicated: Secondary | ICD-10-CM | POA: Diagnosis not present

## 2023-09-13 DIAGNOSIS — K209 Esophagitis, unspecified without bleeding: Secondary | ICD-10-CM | POA: Diagnosis not present

## 2023-09-13 DIAGNOSIS — Z79899 Other long term (current) drug therapy: Secondary | ICD-10-CM | POA: Diagnosis not present

## 2023-09-13 DIAGNOSIS — Z7952 Long term (current) use of systemic steroids: Secondary | ICD-10-CM

## 2023-09-13 DIAGNOSIS — Z716 Tobacco abuse counseling: Secondary | ICD-10-CM | POA: Diagnosis not present

## 2023-09-13 DIAGNOSIS — Z8701 Personal history of pneumonia (recurrent): Secondary | ICD-10-CM

## 2023-09-13 DIAGNOSIS — J189 Pneumonia, unspecified organism: Secondary | ICD-10-CM

## 2023-09-13 DIAGNOSIS — K449 Diaphragmatic hernia without obstruction or gangrene: Secondary | ICD-10-CM | POA: Diagnosis present

## 2023-09-13 DIAGNOSIS — Z8659 Personal history of other mental and behavioral disorders: Secondary | ICD-10-CM | POA: Diagnosis not present

## 2023-09-13 DIAGNOSIS — Z8619 Personal history of other infectious and parasitic diseases: Secondary | ICD-10-CM | POA: Diagnosis not present

## 2023-09-13 DIAGNOSIS — J9621 Acute and chronic respiratory failure with hypoxia: Secondary | ICD-10-CM | POA: Diagnosis present

## 2023-09-13 DIAGNOSIS — J690 Pneumonitis due to inhalation of food and vomit: Secondary | ICD-10-CM | POA: Diagnosis present

## 2023-09-13 DIAGNOSIS — I10 Essential (primary) hypertension: Secondary | ICD-10-CM | POA: Diagnosis present

## 2023-09-13 DIAGNOSIS — E119 Type 2 diabetes mellitus without complications: Secondary | ICD-10-CM | POA: Diagnosis present

## 2023-09-13 DIAGNOSIS — K2100 Gastro-esophageal reflux disease with esophagitis, without bleeding: Secondary | ICD-10-CM | POA: Diagnosis present

## 2023-09-13 DIAGNOSIS — R933 Abnormal findings on diagnostic imaging of other parts of digestive tract: Secondary | ICD-10-CM

## 2023-09-13 DIAGNOSIS — R54 Age-related physical debility: Secondary | ICD-10-CM

## 2023-09-13 LAB — BLOOD GAS, VENOUS
Acid-Base Excess: 7.3 mmol/L — ABNORMAL HIGH (ref 0.0–2.0)
Bicarbonate: 33.6 mmol/L — ABNORMAL HIGH (ref 20.0–28.0)
O2 Saturation: 48.4 %
Patient temperature: 37
pCO2, Ven: 53 mmHg (ref 44–60)
pH, Ven: 7.41 (ref 7.25–7.43)
pO2, Ven: 32 mmHg (ref 32–45)

## 2023-09-13 LAB — CBC WITH DIFFERENTIAL/PLATELET
Abs Immature Granulocytes: 0.02 10*3/uL (ref 0.00–0.07)
Basophils Absolute: 0.1 10*3/uL (ref 0.0–0.1)
Basophils Relative: 2 %
Eosinophils Absolute: 0.2 10*3/uL (ref 0.0–0.5)
Eosinophils Relative: 4 %
HCT: 41.1 % (ref 39.0–52.0)
Hemoglobin: 13.3 g/dL (ref 13.0–17.0)
Immature Granulocytes: 0 %
Lymphocytes Relative: 43 %
Lymphs Abs: 2.7 10*3/uL (ref 0.7–4.0)
MCH: 30.2 pg (ref 26.0–34.0)
MCHC: 32.4 g/dL (ref 30.0–36.0)
MCV: 93.2 fL (ref 80.0–100.0)
Monocytes Absolute: 0.8 10*3/uL (ref 0.1–1.0)
Monocytes Relative: 12 %
Neutro Abs: 2.5 10*3/uL (ref 1.7–7.7)
Neutrophils Relative %: 39 %
Platelets: 278 10*3/uL (ref 150–400)
RBC: 4.41 MIL/uL (ref 4.22–5.81)
RDW: 16.6 % — ABNORMAL HIGH (ref 11.5–15.5)
WBC: 6.3 10*3/uL (ref 4.0–10.5)
nRBC: 0 % (ref 0.0–0.2)

## 2023-09-13 LAB — COMPREHENSIVE METABOLIC PANEL WITH GFR
ALT: 11 U/L (ref 0–44)
AST: 33 U/L (ref 15–41)
Albumin: 3.5 g/dL (ref 3.5–5.0)
Alkaline Phosphatase: 61 U/L (ref 38–126)
Anion gap: 7 (ref 5–15)
BUN: 10 mg/dL (ref 8–23)
CO2: 28 mmol/L (ref 22–32)
Calcium: 9.7 mg/dL (ref 8.9–10.3)
Chloride: 106 mmol/L (ref 98–111)
Creatinine, Ser: 1.09 mg/dL (ref 0.61–1.24)
GFR, Estimated: >60 mL/min (ref >60)
Glucose, Bld: 122 mg/dL — ABNORMAL HIGH (ref 70–99)
Potassium: 4 mmol/L (ref 3.5–5.1)
Sodium: 141 mmol/L (ref 135–145)
Total Bilirubin: 0.9 mg/dL (ref 0.0–1.2)
Total Protein: 6.7 g/dL (ref 6.5–8.1)

## 2023-09-13 LAB — LACTIC ACID, PLASMA
Lactic Acid, Venous: 2.2 mmol/L (ref 0.5–1.9)
Lactic Acid, Venous: 1.6 mmol/L (ref 0.5–1.9)

## 2023-09-13 LAB — RESP PANEL BY RT-PCR (RSV, FLU A&B, COVID)  RVPGX2
Influenza A by PCR: NEGATIVE
Influenza B by PCR: NEGATIVE
Resp Syncytial Virus by PCR: NEGATIVE
SARS Coronavirus 2 by RT PCR: NEGATIVE

## 2023-09-13 LAB — TROPONIN I (HIGH SENSITIVITY)
Troponin I (High Sensitivity): 14 ng/L (ref <18)
Troponin I (High Sensitivity): 7 ng/L (ref <18)

## 2023-09-13 LAB — BRAIN NATRIURETIC PEPTIDE: B Natriuretic Peptide: 23.5 pg/mL (ref 0.0–100.0)

## 2023-09-13 MED ORDER — SODIUM CHLORIDE 0.9 % IV SOLN
2.0000 g | Freq: Once | INTRAVENOUS | Status: AC
  Administered 2023-09-13: 2 g via INTRAVENOUS
  Filled 2023-09-13: qty 12.5

## 2023-09-13 MED ORDER — SODIUM CHLORIDE 0.9 % IV SOLN
500.0000 mg | Freq: Once | INTRAVENOUS | Status: AC
  Administered 2023-09-13: 500 mg via INTRAVENOUS
  Filled 2023-09-13: qty 5

## 2023-09-13 MED ORDER — PANTOPRAZOLE SODIUM 40 MG PO TBEC
40.0000 mg | DELAYED_RELEASE_TABLET | Freq: Two times a day (BID) | ORAL | Status: DC
  Administered 2023-09-13 – 2023-09-26 (×25): 40 mg via ORAL
  Filled 2023-09-13 (×25): qty 1

## 2023-09-13 MED ORDER — GUAIFENESIN ER 600 MG PO TB12
600.0000 mg | ORAL_TABLET | Freq: Two times a day (BID) | ORAL | Status: DC
  Administered 2023-09-13 – 2023-09-26 (×24): 600 mg via ORAL
  Filled 2023-09-13 (×25): qty 1

## 2023-09-13 MED ORDER — ONDANSETRON HCL 4 MG/2ML IJ SOLN: 4.0000 mg | Freq: Four times a day (QID) | INTRAMUSCULAR | Status: DC | PRN

## 2023-09-13 MED ORDER — SODIUM CHLORIDE 0.9 % IV SOLN
3.0000 g | Freq: Four times a day (QID) | INTRAVENOUS | Status: DC
  Administered 2023-09-13 – 2023-09-17 (×16): 3 g via INTRAVENOUS
  Filled 2023-09-13 (×22): qty 8

## 2023-09-13 MED ORDER — VANCOMYCIN HCL IN DEXTROSE 1-5 GM/200ML-% IV SOLN
1000.0000 mg | Freq: Once | INTRAVENOUS | Status: AC
  Administered 2023-09-13: 1000 mg via INTRAVENOUS
  Filled 2023-09-13: qty 200

## 2023-09-13 MED ORDER — ONDANSETRON HCL 4 MG PO TABS
4.0000 mg | ORAL_TABLET | Freq: Four times a day (QID) | ORAL | Status: DC | PRN
Start: 2023-09-13 — End: 2023-09-26

## 2023-09-13 MED ORDER — ACETAMINOPHEN 650 MG RE SUPP: 650.0000 mg | Freq: Four times a day (QID) | RECTAL | Status: DC | PRN

## 2023-09-13 MED ORDER — PREDNISONE 20 MG PO TABS
40.0000 mg | ORAL_TABLET | Freq: Every day | ORAL | Status: AC
  Administered 2023-09-14 – 2023-09-17 (×4): 40 mg via ORAL
  Filled 2023-09-13 (×4): qty 2

## 2023-09-13 MED ORDER — METHYLPREDNISOLONE SODIUM SUCC 125 MG IJ SOLR
125.0000 mg | Freq: Once | INTRAMUSCULAR | Status: AC
  Administered 2023-09-13: 125 mg via INTRAVENOUS
  Filled 2023-09-13: qty 2

## 2023-09-13 MED ORDER — BUDESONIDE 0.25 MG/2ML IN SUSP
0.2500 mg | Freq: Two times a day (BID) | RESPIRATORY_TRACT | Status: DC
Start: 2023-09-13 — End: 2023-09-14
  Administered 2023-09-13 – 2023-09-14 (×2): 0.25 mg via RESPIRATORY_TRACT
  Filled 2023-09-13 (×3): qty 2

## 2023-09-13 MED ORDER — LACTATED RINGERS IV BOLUS
1000.0000 mL | Freq: Once | INTRAVENOUS | Status: AC
  Administered 2023-09-13: 1000 mL via INTRAVENOUS

## 2023-09-13 MED ORDER — IPRATROPIUM-ALBUTEROL 0.5-2.5 (3) MG/3ML IN SOLN
3.0000 mL | Freq: Once | RESPIRATORY_TRACT | Status: AC
Start: 2023-09-13 — End: 2023-09-13
  Administered 2023-09-13: 3 mL via RESPIRATORY_TRACT
  Filled 2023-09-13: qty 3

## 2023-09-13 MED ORDER — SODIUM CHLORIDE 0.9% FLUSH
3.0000 mL | Freq: Two times a day (BID) | INTRAVENOUS | Status: DC
Start: 2023-09-13 — End: 2023-09-26
  Administered 2023-09-13 – 2023-09-22 (×12): 3 mL via INTRAVENOUS

## 2023-09-13 MED ORDER — IOHEXOL 350 MG/ML SOLN
75.0000 mL | Freq: Once | INTRAVENOUS | Status: AC | PRN
  Administered 2023-09-13: 75 mL via INTRAVENOUS

## 2023-09-13 MED ORDER — ACETAMINOPHEN 325 MG PO TABS: 650.0000 mg | ORAL_TABLET | Freq: Four times a day (QID) | ORAL | Status: DC | PRN

## 2023-09-13 MED ORDER — POLYETHYLENE GLYCOL 3350 17 G PO PACK: 17.0000 g | PACK | Freq: Every day | ORAL | Status: DC | PRN

## 2023-09-13 MED ORDER — ENOXAPARIN SODIUM 40 MG/0.4ML IJ SOSY
40.0000 mg | PREFILLED_SYRINGE | INTRAMUSCULAR | Status: DC
  Administered 2023-09-13 – 2023-09-25 (×13): 40 mg via SUBCUTANEOUS
  Filled 2023-09-13 (×14): qty 0.4

## 2023-09-13 MED ORDER — IPRATROPIUM-ALBUTEROL 0.5-2.5 (3) MG/3ML IN SOLN
3.0000 mL | Freq: Four times a day (QID) | RESPIRATORY_TRACT | Status: DC
  Administered 2023-09-13 – 2023-09-14 (×6): 3 mL via RESPIRATORY_TRACT
  Filled 2023-09-13 (×6): qty 3

## 2023-09-13 NOTE — ED Notes (Signed)
 Both sets of blood cultures were drawn before first antibiotic was started but sent after.

## 2023-09-13 NOTE — ED Triage Notes (Signed)
 Pt arrives via EMS from [facility] group home for SOB with exertion. Pt was recently hospitalized for a week and was sent home yesterday. Pt was prescribed PRN oxygen but it refusing to use it.Pt satting at 88% on RA for EMS and then 94% on 3L [place].  EMS vitals: 104/70 BP 75 HR

## 2023-09-13 NOTE — Assessment & Plan Note (Signed)
 Given lower lobe appearance of pneumonia once again, concern for aspiration pneumonia as noted above.  He has a distant history of oral phase dysphagia noted in 2019.  - S/p vancomycin , Rocephin , and azithromycin  - Continue with Unasyn  per pharmacy dosing - Guaifenesin  twice daily - Dysphagia 1 diet - SLP evaluation

## 2023-09-13 NOTE — ED Notes (Addendum)
 CCMD called to initiate cardiac monitoring

## 2023-09-13 NOTE — ED Notes (Signed)
 Two RN's attempted for an IV and were unsuccessful. IV team consult put in

## 2023-09-13 NOTE — Assessment & Plan Note (Signed)
 No 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, however given acute hypoxic respiratory failure, will continue with steroids and DuoNebs.  - S/p Solu-Medrol  125 mg once - Start prednisone  40 mg tomorrow to complete a 5-day course - DuoNebs every 6 hours - Pulmicort  twice daily

## 2023-09-13 NOTE — Assessment & Plan Note (Signed)
 Patient is presenting with shortness of breath, for what sounds like for 1 day, after recently being discharged from LTAC after a prolonged hospitalization for acute hypoxic respiratory failure requiring mechanical ventilation secondary to COPD, influenza, and then superimposed bacterial pneumonia.  He was discharged on oxygen as needed, currently requiring 3 L.  Patient has a abnormal appearing esophagus on CT and given bibasilar appearance of pneumonia, as it did before, wonder if this is secondary to aspiration.  No 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  - Continue supplemental oxygen to maintain oxygen saturation above 88% - Wean as tolerated

## 2023-09-13 NOTE — ED Notes (Signed)
 Patient transported to CT

## 2023-09-13 NOTE — ED Notes (Signed)
 Pt assisted with urinal

## 2023-09-13 NOTE — H&P (Signed)
 History and Physical    [name] ZOX:[id number] DOB: [birth year] DOA: 09/13/2023 DOS: the patient was seen and examined on 09/13/2023 PCP: Karron Pagan, MD  Patient coming from: ALF/ILF, Group home  Chief Complaint:  Chief Complaint  Patient presents with   Shortness of Breath   HPI: Jeremiah Nguyen is a 68 y.o. male with medical history significant of COPD, alcohol use disorder, seizure disorder, recent admission for severe hypoxic respiratory failure requiring mechanical ventilation discharged from LTAC yesterday, who presents to the ED due to shortness of breath.  [name] tells me that he has been experiencing shortness of breath for a long time now and is uncertain if it got worse recently.  He denies any cough, rhinorrhea, congestion, fever, chills, chest pain, palpitations, lower extremity swelling.  When asked if he has been wearing his oxygen since arriving back to the group home, he states yes.  Per chart review, patient returned home from LTAC yesterday.  He was prescribed oxygen as needed but had not been wearing it.  EMS was called today after patient was noted to be short of breath with oxygen saturation of 88% on room air.  He was placed on 3 L with improvement to 94%.  [name] states that he occasionally vomits after he eats, however he cannot recall if he has any choking episodes while he eats or drinks.  Poor historian.  ED course: On arrival to the ED, patient was normotensive at 99/59 with heart rate of 58.  He was saturating at 90% on 3 L.  He was afebrile at 98.3.  Initial workup notable for unremarkable CBC, CMP, VBG, BNP and troponin negative.  Lactic acid within normal limits.  Influenza, RSV and COVID-19 negative.  Chest x-ray with bibasilar opacities.  CT head with no acute findings.  CTA of the chest with interval mild to moderate patchy areas of confluence in by lateral lower lobes concerning for pneumonia, and stable emphysematous  changes.  Notable for also diffusely enlarged esophagus with diffuse wall thickening that is stable.  Patient started on Solu-Medrol , vancomycin , azithromycin , cefepime , DuoNebs, and IV fluids.  TR[facility] contacted for admission.   Review of Systems: As mentioned in the history of present illness. All other systems reviewed and are negative.  Past Medical History:  Diagnosis Date   Acute metabolic encephalopathy 07/31/2023   AKI (acute kidney injury) (HCC) 07/31/2023   COPD (chronic obstructive pulmonary disease) (HCC)    Eating disorder    Epistaxis 08/04/2023   GERD (gastroesophageal reflux disease)    Influenza A with pneumonia 07/31/2023   Liver disease    Multifocal pneumonia 08/05/2023   Seizures (HCC)    Septic shock (HCC) 11/25/2014   Sleep apnea    Past Surgical History:  Procedure Laterality Date   COLONOSCOPY WITH PROPOFOL  N/A 12/05/2020   Procedure: COLONOSCOPY WITH PROPOFOL ;  Surgeon: Shane Darling, MD;  Location: [facility] ENDOSCOPY;  Service: Endoscopy;  Laterality: N/A;   ESOPHAGOGASTRODUODENOSCOPY (EGD) WITH PROPOFOL  N/A 02/25/2018   Procedure: ESOPHAGOGASTRODUODENOSCOPY (EGD) WITH PROPOFOL ;  Surgeon: Luke Salaam, MD;  Location[facility] END[facility]COPY;  Service: Gastroenterology;  Laterality: N/A;   LESION EXCISION Left 12-15-13   follicular cyst   SKIN BIOPSY     Social History:  reports that he has quit smoking. His smoking use included cigarettes. He has a 15 pack-year smoking history. He has never used smokeless tobacco. He reports that he does not drink alcohol and does not use drugs.  No Known  Allergies  Family History  Problem Relation Age of Onset   Cancer Mother        breast    Prior to Admission medications   Medication Sig Start Date End Date Taking? Authorizing Provider  acetaminophen  (TYLENOL ) 325 MG tablet Take 2 tablets (650 mg total) by mouth every 4 (four) hours as needed for mild pain (pain score 1-3), moderate pain (pain score 4-6), fever or headache.  08/12/23   Darus Engels A, DO  albuterol  (PROVENTIL ) (2.5 MG/3ML) 0.083% nebulizer solution Take 3 mLs (2.5 mg total) by nebulization every 4 (four) hours as needed for shortness of breath or wheezing. 08/12/23   Darus Engels A, DO  ascorbic acid (VITAMIN C) 500 MG tablet Take 500 mg by mouth daily.    [provider]  aspirin  81 MG chewable tablet Chew by mouth daily.    [provider]  baclofen  (LIORESAL ) 10 MG tablet TAKE ONE TABLET BY MOUTH FOUR TIMES A DAY 12/17/19   Malfi, Judee Norway, FNP  benzonatate  (TESSALON ) 100 MG capsule Take 1 capsule (100 mg total) by mouth 3 (three) times daily. 08/12/23   Montey Apa, DO  BREZTRI AEROSPHERE 160-9-4.8 MCG/ACT AERO Inhale into the lungs. 06/18/22   [provider]  chlorproMAZINE  (THORAZINE ) 50 MG tablet TAKE ONE TABLET BY MOUTH FOUR TIMES A DAY 12/17/19   Malfi, Judee Norway, FNP  Cholecalciferol (VITAMIN D3) 1.25 MG (50000 UT) CAPS Take 50,000 Units by mouth once a week.    [provider]  empagliflozin (JARDIANCE) 25 MG TABS tablet Take 25 mg by mouth daily.    [provider]  feeding supplement (ENSURE ENLIVE / ENSURE PLUS) LIQD Take 237 mLs by mouth 3 (three) times daily between meals. 08/12/23   Montey Apa, DO  ferrous sulfate  325 (65 FE) MG EC tablet Take 1 tablet (325 mg total) by mouth every other day. 03/15/23   Timmy Forbes, MD  fluticasone  furoate-vilanterol (BREO ELLIPTA ) 100-25 MCG/ACT AEPB Inhale 1 puff into the lungs daily. 08/13/23   Montey Apa, DO  folic acid  (FOLVITE ) 1 MG tablet Take 1 mg by mouth daily.    [provider]  guaiFENesin  (ROBITUSSIN) 100 MG/5ML liquid Take 5 mLs by mouth every 4 (four) hours as needed for cough or to loosen phlegm. 08/12/23   Montey Apa, DO  ipratropium-albuterol  (DUONEB) 0.5-2.5 (3) MG/3ML SOLN Take 3 mLs by nebulization 3 (three) times daily. 08/12/23   Montey Apa, DO  lamoTRIgine  (LAMICTAL ) 150 MG tablet Take 150 mg by  mouth 2 (two) times daily. 03/15/22   [provider]  metoprolol  succinate (TOPROL -XL) 25 MG 24 hr tablet Take 1 tablet (25 mg total) by mouth daily. 08/13/23   Montey Apa, DO  Mouthwashes (MOUTH RINSE) LIQD solution 15 mLs by Mouth Rinse route as needed (for oral care). 08/12/23   Montey Apa, DO  Multiple Vitamin (MULTIVITAMIN WITH MINERALS) TABS tablet Take 1 tablet by mouth daily. 08/13/23   Darus Engels A, DO  nicotine  (NICODERM CQ  - DOSED IN MG/24 HOURS) 14 mg/24hr patch Place 1 patch (14 mg total) onto the skin daily. 08/13/23   Montey Apa, DO  nystatin  (MYCOSTATIN ) 100000 UNIT/ML suspension Take 5 mLs (500,000 Units total) by mouth 4 (four) times daily. 08/12/23   Montey Apa, DO  omeprazole  (PRILOSEC) 40 MG capsule Take 1 capsule (40 mg total) by mouth 2 (two) times daily with a meal. 08/12/23   Montey Apa,  DO  ondansetron  (ZOFRAN ) 4 MG/2ML SOLN injection Inject 2 mLs (4 mg total) into the vein every 6 (six) hours as needed for vomiting or nausea. 08/12/23   Montey Apa, DO  polyethylene glycol (MIRALAX  / GLYCOLAX ) 17 g packet Take 17 g by mouth 2 (two) times daily. 08/12/23   Montey Apa, DO  predniSONE  (DELTASONE ) 10 MG tablet Take 3 tablets (30 mg total) by mouth daily with breakfast. 08/13/23   Montey Apa, DO  senna (SENOKOT) 8.6 MG TABS tablet Take 2 tablets (17.2 mg total) by mouth at bedtime. 08/12/23   Montey Apa, DO  sertraline  (ZOLOFT ) 25 MG tablet Take 1 tablet (25 mg total) by mouth daily. 09/02/18   Brenda Calkin, NP  thiamine  (VITAMIN B-1) 100 MG tablet Take 1 tablet (100 mg total) by mouth daily. 08/13/23   Montey Apa, DO  traZODone  (DESYREL ) 50 MG tablet Take 1 tablet (50 mg total) by mouth at bedtime as needed for sleep. 08/12/23   Darus Engels A, DO  umeclidinium bromide  (INCRUSE ELLIPTA ) 62.5 MCG/ACT AEPB Inhale 1 puff into the lungs daily. 08/13/23   Montey Apa, DO    Physical  Exam: Vitals:   09/13/23 1400 09/13/23 1430 09/13/23 1451 09/13/23 1816  BP: 125/68 138/76    Pulse: 70 77    Resp: (!) 23  (!) 22   Temp:    97.9 F (36.6 C)  TempSrc:    Oral  SpO2: 98% 97%     Physical Exam Vitals and nursing note reviewed.  Constitutional:      General: He is not in acute distress.    Appearance: He is normal weight. He is not toxic-appearing.  HENT:     Head: Normocephalic and atraumatic.  Cardiovascular:     Rate and Rhythm: Normal rate and regular rhythm.     Heart sounds: No murmur heard. Pulmonary:     Effort: Pulmonary effort is normal. No accessory muscle usage or respiratory distress.     Breath sounds: Rales (Bibasilar, up to mid lung fieelds) present. No decreased breath sounds, wheezing or rhonchi.  Abdominal:     Palpations: Abdomen is soft.     Tenderness: There is no abdominal tenderness.  Musculoskeletal:     Right lower leg: No tenderness. No edema.     Left lower leg: No tenderness. No edema.  Skin:    General: Skin is warm and dry.  Neurological:     Mental Status: He is alert.     Comments:  Patient is alert and oriented, however a poor historian.  Left arm pill-rolling tremor noted.  No other deficits  Psychiatric:        Mood and Affect: Mood normal.        Behavior: Behavior normal.    Data Reviewed: CBC with WBC at 6.3, hemoglobin of 13.3, platelets of 278 CMP with sodium of 141, potassium 4.1, bicarb 28, glucose 122, PN 10, creat 1.09, AST 33, ALT 11, GFR above 60 VBG with pH of 7.41, PCO2 of 53 Lactic acid 1.6 BNP 23.5 Troponin 7 COVID-19, influenza and RSV PCR negative  EKG personally reviewed.  Sinus rhythm with rate of 67.  No acute ischemic changes.  CT Angio Chest PE W/Cm &/Or Wo Cm Result Date: 09/13/2023 CLINICAL DATA:  Shortness of breath and hypoxia. Increased shortness of breath today. Productive cough for the past 24 hours. No subjective fever. COPD. Ex-smoker. EXAM: CT ANGIOGRAPHY CHEST WITH CONTRAST  TECHNIQUE:  Multidetector CT imaging of the chest was performed using the standard protocol during bolus administration of intravenous contrast. Multiplanar CT image reconstructions and MIPs were obtained to evaluate the vascular anatomy. RADIATION DOSE REDUCTION: This exam was performed according to the departmental dose-optimization program which includes automated exposure control, adjustment of the mA and/or kV according to patient size and/or use of iterative reconstruction technique. CONTRAST:  75mL OMNIPAQUE  IOHEXOL  350 MG/ML SOLN COMPARISON:  Chest radiographs dated 08/11/2023 and 08/05/2023. Chest CTA dated 07/29/2023. FINDINGS: Cardiovascular: Atheromatous calcifications, including the coronary arteries and aorta. Normally opacified pulmonary arteries with no pulmonary arterial filling defects seen. Enlarged heart. No pericardial effusion. Mediastinum/Nodes: Stable diffusely enlarged esophagus with diffuse, concentric wall thickening. Unremarkable thyroid gland and trachea. Interval minimally enlarged right hilar nodes with short axis diameters of 10.4 mm on image number 74/4 9.5 mm on image number 76/4. No other enlarged lymph nodes. Lungs/Pleura: Extensive bilateral centrilobular bullous changes are again demonstrated. A large paraseptal bulla in the medial right middle lobe with adjacent compressive atelectasis or scarring is not changed significantly. Interval mild-to-moderate patchy, and small confluent densities are demonstrated in both lower lobes. No pleural fluid. The previously demonstrated stable 4 mm right apical nodule is less dense and does not have the appearance of the nodule today. Upper Abdomen: No acute abnormality. Musculoskeletal: Minimal thoracic and marked lower cervical spine degenerative changes. Review of the MIP images confirms the above findings. IMPRESSION: 1. No pulmonary embolus. 2. Interval mild-to-moderate patchy, and small areas of confluence densities in both lower lobes,  most consistent with pneumonia. This could also represent an atypical appearance of atelectasis given the degree of emphysema the patient has. 3. Interval minimally enlarged right hilar nodes, most likely reactive. 4. Stable extensive bilateral centrilobular bullous changes. 5. Stable large paraseptal bulla in the medial right middle lobe with adjacent compressive atelectasis or scarring. 6. Stable diffusely enlarged esophagus with diffuse, concentric wall thickening. This is most consistent with chronic reflux esophagitis. 7. Cardiomegaly. 8.  Calcific coronary artery and aortic atherosclerosis. Aortic Atherosclerosis (ICD10-I70.0) and Emphysema (ICD10-J43.9). Electronically Signed   By: Catherin Closs M.D.   On: 09/13/2023 16:56   CT Head Wo Contrast Result Date: 09/13/2023 CLINICAL DATA:  Altered mental status. EXAM: CT HEAD WITHOUT CONTRAST TECHNIQUE: Contiguous axial images were obtained from the base of the skull through the vertex without intravenous contrast. RADIATION DOSE REDUCTION: This exam was performed according to the departmental dose-optimization program which includes automated exposure control, adjustment of the mA and/or kV according to patient size and/or use of iterative reconstruction technique. COMPARISON:  Head CT dated 07/26/2022. FINDINGS: Brain: Mild age-related atrophy and chronic microvascular ischemic changes. There is no acute intracranial hemorrhage. No mass effect or midline shift. No extra-axial fluid collection. Vascular: No hyperdense vessel or unexpected calcification. Skull: Normal. Negative for fracture or focal lesion. Sinuses/Orbits: Mild mucoperiosteal thickening of paranasal sinuses. No air-fluid level. The mastoid air cells are clear. Other: None IMPRESSION: 1. No acute intracranial pathology. 2. Mild age-related atrophy and chronic microvascular ischemic changes. Electronically Signed   By: Angus Bark M.D.   On: 09/13/2023 14:10   DG Chest Portable 1  View Result Date: 09/13/2023 CLINICAL DATA:  Shortness of breath with exertion. EXAM: PORTABLE CHEST 1 VIEW COMPARISON:  08/11/2023 FINDINGS: Stable cardiomediastinal contours. Aortic atherosclerosis. Chronic changes of COPD/emphysema. Bibasilar opacities within both lung bases may reflect atelectasis or infiltrate. No significant pleural effusion or pneumothorax. Visualized osseous structures appear normal. IMPRESSION: 1. Bibasilar opacities may reflect atelectasis  or infiltrate. 2. Chronic changes of COPD/emphysema. Electronically Signed   By: Kimberley Penman M.D.   On: 09/13/2023 09:05   There are no new results to review at this time.  Assessment and Plan:  * Acute on chronic hypoxic respiratory failure (HCC) Patient is presenting with shortness of breath, for what sounds like for 1 day, after recently being discharged from LTAC after a prolonged hospitalization for acute hypoxic respiratory failure requiring mechanical ventilation secondary to COPD, influenza, and then superimposed bacterial pneumonia.  He was discharged on oxygen as needed, currently requiring 3 L.  Patient has a abnormal appearing esophagus on CT and given bibasilar appearance of pneumonia, as it did before, wonder if this is secondary to aspiration.  No 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  - Continue supplemental oxygen to maintain oxygen saturation above 88% - Wean as tolerated  Aspiration pneumonia (HCC) Given lower lobe appearance of pneumonia once again, concern for aspiration pneumonia as noted above.  He has a distant history of oral phase dysphagia noted in 2019.  - S/p vancomycin , Rocephin , and azithromycin  - Continue with Unasyn  per pharmacy dosing - Guaifenesin  twice daily - Dysphagia 1 diet - SLP evaluation  Bullous emphysema (HCC) No 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, however given acute hypoxic respiratory failure, will continue with steroids and DuoNebs.  - S/p Solu-Medrol  125 mg once - Start prednisone  40 mg tomorrow to  complete a 5-day course - DuoNebs every 6 hours - Pulmicort  twice daily  Seizures (HCC) - Continue home regimen  Advance Care Planning:   Code Status: Full Code   Consults: None  Family Communication: No family at bedside  Severity of Illness: The appropriate patient status for this patient is INPATIENT. Inpatient status is judged to be reasonable and necessary in order to provide the required intensity of service to ensure the patient's safety. The patient's presenting symptoms, physical exam findings, and initial radiographic and laboratory data in the context of their chronic comorbidities is felt to place them at high risk for further clinical deterioration. Furthermore, it is not anticipated that the patient will be medically stable for discharge from the hospital within 2 midnights of admission.   * I certify that at the point of admission it is my clinical judgment that the patient will require inpatient hospital care spanning beyond 2 midnights from the point of admission due to high intensity of service, high risk for further deterioration and high frequency of surveillance required.*  Author: Avi Body, MD 09/13/2023 6:20 PM  For on call review [email or web address].

## 2023-09-13 NOTE — Sepsis Progress Note (Signed)
 eLink is following this Code Sepsis.

## 2023-09-13 NOTE — Assessment & Plan Note (Signed)
 -  Continue home regimen

## 2023-09-13 NOTE — ED Provider Notes (Signed)
 [facility] Provider Note    Event Date/Time   First MD Initiated Contact with Patient 09/13/23 [phone]     (approximate)   History   Chief Complaint Shortness of Breath   HPI  Jeremiah Nguyen is a 68 y.o. male with past medical history of alcohol abuse, seizures, and COPD who presents to the ED complaining of shortness of breath.  Per EMS, patient was recently hospitalized for a week for difficulty breathing and returned back to group home yesterday.  He has been prescribed oxygen to use as needed, but has been refusing to use it with some increasing difficulty breathing today.  Patient noted to have oxygen saturations of 88% on room air, improved to 94% on 3 L.  Patient states that he has had a productive cough over the past 24 hours, is not aware of any fevers and denies any pain in his chest.  He has not noticed any pain or swelling in his legs.  Patient did have extended admission to the hospital last month for influenza and pneumonia with prolonged respiratory failure.     Physical Exam   Triage Vital Signs: ED Triage Vitals [09/13/23 0822]  Encounter Vitals Group     BP (!) 99/59     Systolic BP Percentile      Diastolic BP Percentile      Pulse Rate 79     Resp (!) 32     Temp 98.3 F (36.8 C)     Temp Source Oral     SpO2 90 %     Weight      Height      Head Circumference      [facility] Flow      Pain Score 0     Pain Loc      Pain Education      Exclude from Growth Chart     Most recent vital signs: Vitals:   09/13/23 1430 09/13/23 1451  BP: 138/76   Pulse: 77   Resp:  (!) 22  Temp:    SpO2: 97%     Constitutional: Alert and oriented. Eyes: Conjunctivae are normal. Head: Atraumatic. Nose: No congestion/rhinnorhea. Mouth/Throat: Mucous membranes are moist.  Cardiovascular: Normal rate, regular rhythm. Grossly normal heart sounds.  2+ radial pulses bilaterally. Respiratory: Tachypneic with increased respiratory effort.  No  retractions. Lungs with faint end expiratory wheezing. Gastrointestinal: Soft and nontender. No distention. Musculoskeletal: No lower extremity tenderness nor edema.  Neurologic:  Normal speech and language. No gross focal neurologic deficits are appreciated.    ED Results / Procedures / Treatments   Labs (all labs ordered are listed, but only abnormal results are displayed) Labs Reviewed  COMPREHENSIVE METABOLIC PANEL WITH GFR - Abnormal; Notable for the following components:      Result Value   Glucose, Bld 122 (*)    All other components within normal limits  CBC WITH DIFFERENTIAL/PLATELET - Abnormal; Notable for the following components:   RDW 16.6 (*)    All other components within normal limits  LACTIC ACID, PLASMA - Abnormal; Notable for the following components:   Lactic Acid, Venous 2.2 (*)    All other components within normal limits  BLOOD GAS, VENOUS - Abnormal; Notable for the following components:   Bicarbonate 33.6 (*)    Acid-Base Excess 7.3 (*)    All other components within normal limits  RESP PANEL BY RT-PCR (RSV, FLU A&B, COVID)  RVPGX2  CULTURE, BLOOD (ROUTINE X 2)  CULTURE, BLOOD (ROUTINE X 2)  BRAIN NATRIURETIC PEPTIDE  LACTIC ACID, PLASMA  CBC WITH DIFFERENTIAL/PLATELET  TROPONIN I (HIGH SENSITIVITY)  TROPONIN I (HIGH SENSITIVITY)     EKG  ED ECG REPORT I, Twilla Galea, the attending physician, personally viewed and interpreted this ECG.   Date: 09/13/2023  EKG Time: 8:54  Rate: 67  Rhythm: normal sinus rhythm  Axis: LAD  Intervals:none  ST&T Change: None  RADIOLOGY Chest x-ray reviewed and interpreted by me with bilateral lower lobe infiltrates, no edema or effusion noted.  PROCEDURES:  Critical Care performed: No  Procedures   MEDICATIONS ORDERED IN ED: Medications  ipratropium-albuterol  (DUONEB) 0.5-2.5 (3) MG/3ML nebulizer solution 3 mL (3 mLs Nebulization Given 09/13/23 0911)  methylPREDNISolone  sodium succinate  (SOLU-MEDROL ) 125 mg/2 mL injection 125 mg (125 mg Intravenous Given 09/13/23 0946)  lactated ringers bolus 1,000 mL (0 mLs Intravenous Stopped 09/13/23 1153)  vancomycin  (VANCOCIN ) IVPB 1000 mg/200 mL premix (1,000 mg Intravenous New Bag/Given 09/13/23 1428)  ceFEPIme  (MAXIPIME ) 2 g in sodium chloride  0.9 % 100 mL IVPB (0 g Intravenous Stopped 09/13/23 1150)  azithromycin  (ZITHROMAX ) 500 mg in sodium chloride  0.9 % 250 mL IVPB (0 mg Intravenous Stopped 09/13/23 1428)     IMPRESSION / MDM / ASSESSMENT AND PLAN / ED COURSE  I reviewed the triage vital signs and the nursing notes.                              68 y.o. male with past medical history of alcohol abuse, seizures, and COPD who presents to the ED with 24 hours of increasing difficulty breathing with a productive cough.  Patient's presentation is most consistent with acute presentation with potential threat to life or bodily function.  Differential diagnosis includes, but is not limited to, COPD exacerbation, CHF, pneumonia, ACS, PE, anemia, electrolyte abnormality, AKI, influenza, COVID-19.  Patient chronically ill-appearing with increased work of breathing but no obvious respiratory distress, maintaining oxygen saturations on 2 L nasal cannula.  He does have some faint wheezing on exam and will treat with IV Solu-Medrol  and DuoNeb.  Chest x-ray concerning for bilateral lobe lower lobe infiltrate, appears slightly progressed from x-ray last month by my read.  I am concerned for recurrent pneumonia, labs are pending at this time but we will initiate sepsis workup and start on IV antibiotics.  EKG shows no evidence of arrhythmia or ischemia.  Labs without significant anemia, leukocytosis, lecture abnormality, or AKI.  Troponin and BNP are within normal limits, doubt CHF.  On reassessment, patient appears more disoriented and has pulled out his IV.  VBG is unremarkable and CT head is negative for acute process.  Case discussed with hospitalist  for admission for further management of recurrent pneumonia.      FINAL CLINICAL IMPRESSION(S) / ED DIAGNOSES   Final diagnoses:  Shortness of breath  Recurrent pneumonia     Rx / DC Orders   ED Discharge Orders     None        Note:  This document was prepared using Dragon voice recognition software and may include unintentional dictation errors.   Twilla Galea, MD 09/13/23 [phone]

## 2023-09-13 NOTE — Consult Note (Signed)
 CODE SEPSIS - PHARMACY COMMUNICATION  **Broad Spectrum Antibiotics should be administered within 1 hour of Sepsis diagnosis**  Time Code Sepsis Called/Page Received: 0926  Antibiotics Ordered: cefepime , azithromycin  and vancomycin   Time of 1st antibiotic administration: 1020  Additional action taken by pharmacy: N/A  Ramonita Burow ,PharmD Clinical Pharmacist  09/13/2023  9:37 AM

## 2023-09-13 NOTE — ED Notes (Signed)
 Patient and the patient's bed were changed due to patient being wet. Patient placed in a hospital gown.

## 2023-09-14 DIAGNOSIS — J9621 Acute and chronic respiratory failure with hypoxia: Secondary | ICD-10-CM

## 2023-09-14 LAB — BASIC METABOLIC PANEL WITH GFR
Anion gap: 12 (ref 5–15)
BUN: 10 mg/dL (ref 8–23)
CO2: 24 mmol/L (ref 22–32)
Calcium: 9.4 mg/dL (ref 8.9–10.3)
Chloride: 106 mmol/L (ref 98–111)
Creatinine, Ser: 0.96 mg/dL (ref 0.61–1.24)
GFR, Estimated: >60 mL/min (ref >60)
Glucose, Bld: 80 mg/dL (ref 70–99)
Potassium: 4.2 mmol/L (ref 3.5–5.1)
Sodium: 142 mmol/L (ref 135–145)

## 2023-09-14 LAB — HIV ANTIBODY (ROUTINE TESTING W REFLEX): HIV Screen 4th Generation wRfx: NONREACTIVE

## 2023-09-14 LAB — CBC
HCT: 39.9 % (ref 39.0–52.0)
Hemoglobin: 13.2 g/dL (ref 13.0–17.0)
MCH: 30.3 pg (ref 26.0–34.0)
MCHC: 33.1 g/dL (ref 30.0–36.0)
MCV: 91.7 fL (ref 80.0–100.0)
Platelets: 259 10*3/uL (ref 150–400)
RBC: 4.35 MIL/uL (ref 4.22–5.81)
RDW: 16.7 % — ABNORMAL HIGH (ref 11.5–15.5)
WBC: 9.6 10*3/uL (ref 4.0–10.5)
nRBC: 0 % (ref 0.0–0.2)

## 2023-09-14 MED ORDER — FLUTICASONE FUROATE-VILANTEROL 100-25 MCG/ACT IN AEPB
1.0000 | INHALATION_SPRAY | Freq: Every day | RESPIRATORY_TRACT | Status: DC
  Administered 2023-09-14 – 2023-09-26 (×13): 1 via RESPIRATORY_TRACT
  Filled 2023-09-14: qty 28

## 2023-09-14 MED ORDER — BENZONATATE 100 MG PO CAPS
100.0000 mg | ORAL_CAPSULE | Freq: Three times a day (TID) | ORAL | Status: DC
  Administered 2023-09-14 – 2023-09-26 (×34): 100 mg via ORAL
  Filled 2023-09-14 (×34): qty 1

## 2023-09-14 MED ORDER — SERTRALINE HCL 50 MG PO TABS
25.0000 mg | ORAL_TABLET | Freq: Every day | ORAL | Status: DC
  Administered 2023-09-14 – 2023-09-26 (×13): 25 mg via ORAL
  Filled 2023-09-14 (×13): qty 1

## 2023-09-14 MED ORDER — METOPROLOL SUCCINATE ER 25 MG PO TB24
25.0000 mg | ORAL_TABLET | Freq: Every day | ORAL | Status: DC
  Administered 2023-09-15 – 2023-09-26 (×12): 25 mg via ORAL
  Filled 2023-09-14 (×12): qty 1

## 2023-09-14 MED ORDER — LAMOTRIGINE 25 MG PO TABS
150.0000 mg | ORAL_TABLET | Freq: Two times a day (BID) | ORAL | Status: DC
  Administered 2023-09-14 – 2023-09-26 (×24): 150 mg via ORAL
  Filled 2023-09-14 (×18): qty 6
  Filled 2023-09-14: qty 2
  Filled 2023-09-14 (×3): qty 6
  Filled 2023-09-14: qty 2
  Filled 2023-09-14: qty 6

## 2023-09-14 MED ORDER — TRAZODONE HCL 50 MG PO TABS
50.0000 mg | ORAL_TABLET | Freq: Every evening | ORAL | Status: DC | PRN
  Administered 2023-09-25: 50 mg via ORAL
  Filled 2023-09-14: qty 1

## 2023-09-14 MED ORDER — ASPIRIN 81 MG PO CHEW
81.0000 mg | CHEWABLE_TABLET | Freq: Every day | ORAL | Status: DC
Start: 2023-09-15 — End: 2023-09-26
  Administered 2023-09-15 – 2023-09-26 (×12): 81 mg via ORAL
  Filled 2023-09-14 (×12): qty 1

## 2023-09-14 MED ORDER — BACLOFEN 10 MG PO TABS
10.0000 mg | ORAL_TABLET | Freq: Four times a day (QID) | ORAL | Status: DC
  Administered 2023-09-14 – 2023-09-26 (×46): 10 mg via ORAL
  Filled 2023-09-14 (×46): qty 1

## 2023-09-14 MED ORDER — CHLORPROMAZINE HCL 50 MG PO TABS
50.0000 mg | ORAL_TABLET | Freq: Four times a day (QID) | ORAL | Status: DC
  Administered 2023-09-14 – 2023-09-26 (×46): 50 mg via ORAL
  Filled 2023-09-14 (×53): qty 1

## 2023-09-14 MED ORDER — FAMOTIDINE 20 MG PO TABS
20.0000 mg | ORAL_TABLET | Freq: Every day | ORAL | Status: DC
  Administered 2023-09-14 – 2023-09-26 (×13): 20 mg via ORAL
  Filled 2023-09-14 (×13): qty 1

## 2023-09-14 MED ORDER — POLYETHYLENE GLYCOL 3350 17 G PO PACK
17.0000 g | PACK | Freq: Two times a day (BID) | ORAL | Status: DC
  Administered 2023-09-16 – 2023-09-26 (×10): 17 g via ORAL
  Filled 2023-09-14 (×17): qty 1

## 2023-09-14 MED ORDER — IPRATROPIUM-ALBUTEROL 0.5-2.5 (3) MG/3ML IN SOLN
3.0000 mL | Freq: Two times a day (BID) | RESPIRATORY_TRACT | Status: DC
  Administered 2023-09-15 – 2023-09-21 (×14): 3 mL via RESPIRATORY_TRACT
  Filled 2023-09-14 (×14): qty 3

## 2023-09-14 MED ORDER — SENNA 8.6 MG PO TABS
2.0000 | ORAL_TABLET | Freq: Every day | ORAL | Status: DC
  Administered 2023-09-14 – 2023-09-25 (×9): 17.2 mg via ORAL
  Filled 2023-09-14 (×10): qty 2

## 2023-09-14 MED ORDER — UMECLIDINIUM BROMIDE 62.5 MCG/ACT IN AEPB
1.0000 | INHALATION_SPRAY | Freq: Every day | RESPIRATORY_TRACT | Status: DC
  Administered 2023-09-16 – 2023-09-26 (×11): 1 via RESPIRATORY_TRACT
  Filled 2023-09-14 (×3): qty 7

## 2023-09-14 NOTE — Hospital Course (Signed)
 68yo with h/o COPD, ETOH use d/o, seizure d/o, and recent admission for severe hypoxic respiratory failure requiring mechanical ventilation discharged from LTAC 1 day prior on prn home O2 who presented on 4/25 with SOB.  Concern for possible aspiration PNA, started antibiotics with plan for SLP evaluation.  SLP recommending GI consult.

## 2023-09-14 NOTE — Progress Notes (Signed)
 Progress Note   [name]:[id number] DOB: [birth year] DOA: 09/13/2023     1 DOS: the patient was seen and examined on 09/14/2023   Brief hospital course: 67yo with h/o COPD, ETOH use d/o, seizure d/o, and recent admission for severe hypoxic respiratory failure requiring mechanical ventilation discharged from LTAC 1 day prior on prn home O2 who presented on 4/25 with SOB.  Concern for possible aspiration PNA, started antibiotics with plan for SLP evaluation.  Assessment and Plan:  Acute on chronic hypoxic respiratory failure  Shortness of breath occurring 1 day after being discharged from LTAC after a prolonged hospitalization for acute hypoxic respiratory failure requiring mechanical ventilation secondary to COPD, influenza, and then superimposed bacterial pneumonia Discharged on oxygen as needed, currently requiring 3 L   Abnormal appearing esophagus on CT -? 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 Continue with steroids and DuoNebs for now Solu-Medrol  125 mg -> prednisone  40 mg daily to complete a 5-day course DuoNebs every 6 hours Continue Breo, Incruse   Seizures  Continue lamotrigine   DM Recent A1c 6.0, good control Hold Jardiance  HTN Continue Toprol  XL  Mood d/o Continue sertraline , trazodone     Consultants: GI SLP PT OT TOC team  Procedures: None  Antibiotics: Azithromycin  x  1 Vancomycin  x 1 Cefepime  x 1 Unasyn  4/25-  30 Day Unplanned Readmission Risk Score    Flowsheet Row ED to Hosp-Admission (Current) from 09/13/2023 in [facility]  30 Day Unplanned Readmission Risk Score (%) 19.55 Filed at 09/14/2023 0800       This score is the patient's risk of an unplanned readmission within 30 days of being discharged (0 -100%). The score is based on dignosis, age, lab data, medications, orders, and past utilization.   Low:  0-14.9   Medium: 15-21.9   High: 22-29.9   Extreme: 30 and above          Subjective: Somewhat confused, poor historian.  I called to speak with his sister.  She reports that she lives in a family care home (owned by family members).  He was smoking up to 3 ppd.  Her cousin started working with him and cut him to 6 cigs/day.  A couple of months ago, his O2 dropped very low.  The aide called 911 and he was admitted.  He was ventilated for several days and finally came off.  He was doing better but dropped again and went back to ICU for [facility]NC O2.  He was discharged to [facility] and was discharged to [facility] about 3 weeks ago.  He was doing better, getting out of bed independently.  Friday, his O2 level was good and was ready for dc to his family care home 2 days ago.  On arrival back home, he was on prn O2.  He was home one night and they were concerned that his O2 level dropped.  He was not on oxygen at that time, he felt that he didn't need it anymore.  She thinks it was related to not being on O2, does not think he smoked.  His sister thinks he has a problem with his swallowing - he threw his teeth away and when he is eating he seems to hold the food in his mouth and it takes him a while to get it down.  He has had hiccups for years, better with recent medications..    Physical Exam: Vitals:   09/14/23 1130 09/14/23 1200 09/14/23 1242 09/14/23 1259  BP: 126/74 120/71 (!) 142/75   Pulse: 85 77 95   Resp:  (!)  28 20   Temp:  98.1 F (36.7 C) 98.1 F (36.7 C) 98.1 F (36.7 C)  TempSrc:  Oral  Oral  SpO2: 91% 93% 92%   Weight:    60.8 kg  Height:    5\' 6"  (1.676 m)      Intake/Output Summary (Last 24 hours) at 09/14/2023 1355 Last data filed at 09/14/2023 0715 Gross per 24 hour  Intake 200 ml  Output 650 ml  Net -450 ml   Filed Weights   09/14/23 1259  Weight: 60.8 kg    Exam:  General:  Appears calm and comfortable and is in NAD, on [place] O2 Eyes:   normal lids, iris ENT:  grossly normal hearing, lips & tongue, mmm Cardiovascular:  RRR, no m/r/g. No LE edema.  Respiratory:   CTA bilaterally with no wheezes/rales/rhonchi.  Normal respiratory effort. Abdomen:  soft, NT, ND Skin:  no rash or induration seen on limited exam Musculoskeletal:  generalized weakness, no bony abnormality Psychiatric:  blunted mood and affect, speech sparse and difficulty to hear/understand Neurologic:  nonfocal exam  Data Reviewed: I have reviewed the patient's lab results since admission.  Pertinent labs for today include:  VBG unremarkable Labs on admission generally unremarkable Blood cultures pending     Family Communication: None present; I spoke with his sister by telephone  Disposition: Status is: Inpatient Remains inpatient appropriate because: ongoing management  Planned Discharge Destination:  family care home    Time spent: 50 minutes  Author: Lorita Rosa, MD 09/14/2023 1:55 PM  For on call review [email or web address].

## 2023-09-14 NOTE — Plan of Care (Signed)

## 2023-09-14 NOTE — ED Notes (Signed)
 This RN spoke to [facility]ont from lab. Asked if they can draw morning routine labs due to pt being difficult stick. States they will get to it when they can.

## 2023-09-14 NOTE — Consult Note (Signed)
 Consultation  Referring Provider: Hospitalist  Admit date: 09/13/2023 Consult date        09/14/2023 Reason for Consultation:  Abnormal imaging            HPI:   Jeremiah Nguyen is a 68 y.o. gentleman with history of COPD, alcohol use, seizure disorder, and mental disability who is here with concerns of pneumonia, possibly aspiration. He was recently admitted with respiratory failure, possibly due to aspiration but had high oxygen requirements so EGD was not performed. He was discharged from [facility] two days ago but represents with shortness of breath that has improved with 3 L of oxygen. CT imaging suggest a new pneumonia. Patient is very poor historian. He lives in a group home. Has history of tobacco abuse as well.   Past Medical History:  Diagnosis Date   Acute metabolic encephalopathy 07/31/2023   AKI (acute kidney injury) (HCC) 07/31/2023   COPD (chronic obstructive pulmonary disease) (HCC)    Eating disorder    Epistaxis 08/04/2023   GERD (gastroesophageal reflux disease)    Influenza A with pneumonia 07/31/2023   Liver disease    Multifocal pneumonia 08/05/2023   Seizures (HCC)    Septic shock (HCC) 11/25/2014   Sleep apnea     Past Surgical History:  Procedure Laterality Date   COLONOSCOPY WITH PROPOFOL  N/A 12/05/2020   Procedure: COLONOSCOPY WITH PROPOFOL ;  Surgeon: Shane Darling, MD;  Location: [facility] ENDOSCOPY;  Service: Endoscopy;  Laterality: N/A;   ESOPHAGOGASTRODUODENOSCOPY (EGD) WITH PROPOFOL  N/A 02/25/2018   Procedure: ESOPHAGOGASTRODUODENOSCOPY (EGD) WITH PROPOFOL ;  Surgeon: Luke Salaam, MD;  Location: [facility] ENDOSCOPY;  Service: Gastroenterology;  Laterality: N/A;   LESION EXCISION Left 12-15-13   follicular cyst   SKIN BIOPSY      Family History  Problem Relation Age of Onset   Cancer Mother        breast    Social History   Tobacco Use   Smoking status: Former    Current packs/day: 0.50    Average packs/day: 0.5 packs/day for 30.0 years (15.0 ttl  pk-yrs)    Types: Cigarettes   Smokeless tobacco: Never  Vaping Use   Vaping status: Never Used  Substance Use Topics   Alcohol use: No   Drug use: No    Prior to Admission medications   Medication Sig Start Date End Date Taking? Authorizing Provider  famotidine (PEPCID) 20 MG tablet Take 20 mg by mouth daily.   Yes [provider]  SYMBICORT 80-4.5 MCG/ACT inhaler Inhale into the lungs.   Yes [provider]  umeclidinium bromide  (INCRUSE ELLIPTA ) 62.5 MCG/ACT AEPB Inhale 1 puff into the lungs daily. 08/13/23  Yes Montey Apa, DO  acetaminophen  (TYLENOL ) 325 MG tablet Take 2 tablets (650 mg total) by mouth every 4 (four) hours as needed for mild pain (pain score 1-3), moderate pain (pain score 4-6), fever or headache. 08/12/23   Montey Apa, DO  albuterol  (PROVENTIL ) (2.5 MG/3ML) 0.083% nebulizer solution Take 3 mLs (2.5 mg total) by nebulization every 4 (four) hours as needed for shortness of breath or wheezing. 08/12/23   Darus Engels A, DO  ascorbic acid (VITAMIN C) 500 MG tablet Take 500 mg by mouth daily.    [provider]  aspirin  81 MG chewable tablet Chew by mouth daily.    [provider]  baclofen  (LIORESAL ) 10 MG tablet TAKE ONE TABLET BY MOUTH FOUR TIMES A DAY 12/17/19   Malfi, Judee Norway, FNP  benzonatate  (TESSALON ) 100  MG capsule Take 1 capsule (100 mg total) by mouth 3 (three) times daily. 08/12/23   Montey Apa, DO  BREZTRI AEROSPHERE 160-9-4.8 MCG/ACT AERO Inhale into the lungs. 06/18/22   [provider]  chlorproMAZINE  (THORAZINE ) 50 MG tablet TAKE ONE TABLET BY MOUTH FOUR TIMES A DAY 12/17/19   Malfi, Judee Norway, FNP  Cholecalciferol (VITAMIN D3) 1.25 MG (50000 UT) CAPS Take 50,000 Units by mouth once a week.    [provider]  empagliflozin (JARDIANCE) 25 MG TABS tablet Take 25 mg by mouth daily.    [provider]  feeding supplement (ENSURE ENLIVE / ENSURE PLUS) LIQD Take 237 mLs by mouth 3  (three) times daily between meals. 08/12/23   Montey Apa, DO  ferrous sulfate  325 (65 FE) MG EC tablet Take 1 tablet (325 mg total) by mouth every other day. 03/15/23   Timmy Forbes, MD  fluticasone  furoate-vilanterol (BREO ELLIPTA ) 100-25 MCG/ACT AEPB Inhale 1 puff into the lungs daily. 08/13/23   Montey Apa, DO  folic acid  (FOLVITE ) 1 MG tablet Take 1 mg by mouth daily.    [provider]  guaiFENesin  (ROBITUSSIN) 100 MG/5ML liquid Take 5 mLs by mouth every 4 (four) hours as needed for cough or to loosen phlegm. 08/12/23   Montey Apa, DO  ipratropium-albuterol  (DUONEB) 0.5-2.5 (3) MG/3ML SOLN Take 3 mLs by nebulization 3 (three) times daily. 08/12/23   Montey Apa, DO  lamoTRIgine  (LAMICTAL ) 150 MG tablet Take 150 mg by mouth 2 (two) times daily. 03/15/22   [provider]  metoprolol  succinate (TOPROL -XL) 25 MG 24 hr tablet Take 1 tablet (25 mg total) by mouth daily. 08/13/23   Montey Apa, DO  Mouthwashes (MOUTH RINSE) LIQD solution 15 mLs by Mouth Rinse route as needed (for oral care). 08/12/23   Montey Apa, DO  Multiple Vitamin (MULTIVITAMIN WITH MINERALS) TABS tablet Take 1 tablet by mouth daily. 08/13/23   Montey Apa, DO  nicotine  (NICODERM CQ  - DOSED IN MG/24 HOURS) 14 mg/24hr patch Place 1 patch (14 mg total) onto the skin daily. 08/13/23   Montey Apa, DO  nystatin  (MYCOSTATIN ) 100000 UNIT/ML suspension Take 5 mLs (500,000 Units total) by mouth 4 (four) times daily. 08/12/23   Montey Apa, DO  omeprazole  (PRILOSEC) 40 MG capsule Take 1 capsule (40 mg total) by mouth 2 (two) times daily with a meal. 08/12/23   Darus Engels A, DO  ondansetron  (ZOFRAN ) 4 MG/2ML SOLN injection Inject 2 mLs (4 mg total) into the vein every 6 (six) hours as needed for vomiting or nausea. 08/12/23   Montey Apa, DO  polyethylene glycol (MIRALAX  / GLYCOLAX ) 17 g packet Take 17 g by mouth 2 (two) times daily. 08/12/23   Montey Apa, DO   predniSONE  (DELTASONE ) 10 MG tablet Take 3 tablets (30 mg total) by mouth daily with breakfast. 08/13/23   Montey Apa, DO  senna (SENOKOT) 8.6 MG TABS tablet Take 2 tablets (17.2 mg total) by mouth at bedtime. 08/12/23   Montey Apa, DO  sertraline  (ZOLOFT ) 25 MG tablet Take 1 tablet (25 mg total) by mouth daily. 09/02/18   Brenda Calkin, NP  thiamine  (VITAMIN B-1) 100 MG tablet Take 1 tablet (100 mg total) by mouth daily. 08/13/23   Montey Apa, DO  traZODone  (DESYREL ) 50 MG tablet Take 1 tablet (50 mg total) by mouth at bedtime as needed for sleep. 08/12/23   Montey Apa,  DO    Current Facility-Administered Medications  Medication Dose Route Frequency Provider Last Rate Last Admin   acetaminophen  (TYLENOL ) tablet 650 mg  650 mg Oral Q6H PRN Avi Body, MD       Or   acetaminophen  (TYLENOL ) suppository 650 mg  650 mg Rectal Q6H PRN Avi Body, MD       Ampicillin -Sulbactam (UNASYN ) 3 g in sodium chloride  0.9 % 100 mL IVPB  3 g Intravenous Q6H Alice Innocent, RPH   Stopped at 09/14/23 2841   budesonide  (PULMICORT ) nebulizer solution 0.25 mg  0.25 mg Nebulization BID Basaraba, Iulia, MD   0.25 mg at 09/14/23 0940   enoxaparin  (LOVENOX ) injection 40 mg  40 mg Subcutaneous Q24H Basaraba, Iulia, MD   40 mg at 09/13/23 2117   guaiFENesin  (MUCINEX ) 12 hr tablet 600 mg  600 mg Oral BID Basaraba, Iulia, MD   600 mg at 09/14/23 0840   ipratropium-albuterol  (DUONEB) 0.5-2.5 (3) MG/3ML nebulizer solution 3 mL  3 mL Nebulization Q6H Avi Body, MD   3 mL at 09/14/23 3244   ondansetron  (ZOFRAN ) tablet 4 mg  4 mg Oral Q6H PRN Avi Body, MD       Or   ondansetron  (ZOFRAN ) injection 4 mg  4 mg Intravenous Q6H PRN Avi Body, MD       pantoprazole  (PROTONIX ) EC tablet 40 mg  40 mg Oral BID Basaraba, Iulia, MD   40 mg at 09/14/23 0840   polyethylene glycol (MIRALAX  / GLYCOLAX ) packet 17 g  17 g Oral Daily PRN Avi Body, MD       predniSONE   (DELTASONE ) tablet 40 mg  40 mg Oral Q breakfast Basaraba, Iulia, MD   40 mg at 09/14/23 0840   sodium chloride  flush (NS) 0.9 % injection 3 mL  3 mL Intravenous Q12H Avi Body, MD   3 mL at 09/14/23 0841   Current Outpatient Medications  Medication Sig Dispense Refill   famotidine (PEPCID) 20 MG tablet Take 20 mg by mouth daily.     SYMBICORT 80-4.5 MCG/ACT inhaler Inhale into the lungs.     umeclidinium bromide  (INCRUSE ELLIPTA ) 62.5 MCG/ACT AEPB Inhale 1 puff into the lungs daily.     acetaminophen  (TYLENOL ) 325 MG tablet Take 2 tablets (650 mg total) by mouth every 4 (four) hours as needed for mild pain (pain score 1-3), moderate pain (pain score 4-6), fever or headache.     albuterol  (PROVENTIL ) (2.5 MG/3ML) 0.083% nebulizer solution Take 3 mLs (2.5 mg total) by nebulization every 4 (four) hours as needed for shortness of breath or wheezing.     ascorbic acid (VITAMIN C) 500 MG tablet Take 500 mg by mouth daily.     aspirin  81 MG chewable tablet Chew by mouth daily.     baclofen  (LIORESAL ) 10 MG tablet TAKE ONE TABLET BY MOUTH FOUR TIMES A DAY 120 each 0   benzonatate  (TESSALON ) 100 MG capsule Take 1 capsule (100 mg total) by mouth 3 (three) times daily.     [Paused] BREZTRI AEROSPHERE 160-9-4.8 MCG/ACT AERO Inhale into the lungs.     chlorproMAZINE  (THORAZINE ) 50 MG tablet TAKE ONE TABLET BY MOUTH FOUR TIMES A DAY 120 tablet 0   Cholecalciferol (VITAMIN D3) 1.25 MG (50000 UT) CAPS Take 50,000 Units by mouth once a week.     empagliflozin (JARDIANCE) 25 MG TABS tablet Take 25 mg by mouth daily.     feeding supplement (ENSURE ENLIVE / ENSURE PLUS) LIQD Take 237 mLs by mouth  3 (three) times daily between meals.     ferrous sulfate  325 (65 FE) MG EC tablet Take 1 tablet (325 mg total) by mouth every other day. 45 tablet 0   fluticasone  furoate-vilanterol (BREO ELLIPTA ) 100-25 MCG/ACT AEPB Inhale 1 puff into the lungs daily.     folic acid  (FOLVITE ) 1 MG tablet Take 1 mg by mouth daily.      guaiFENesin  (ROBITUSSIN) 100 MG/5ML liquid Take 5 mLs by mouth every 4 (four) hours as needed for cough or to loosen phlegm.     ipratropium-albuterol  (DUONEB) 0.5-2.5 (3) MG/3ML SOLN Take 3 mLs by nebulization 3 (three) times daily.     lamoTRIgine  (LAMICTAL ) 150 MG tablet Take 150 mg by mouth 2 (two) times daily.     metoprolol  succinate (TOPROL -XL) 25 MG 24 hr tablet Take 1 tablet (25 mg total) by mouth daily.     Mouthwashes (MOUTH RINSE) LIQD solution 15 mLs by Mouth Rinse route as needed (for oral care).     Multiple Vitamin (MULTIVITAMIN WITH MINERALS) TABS tablet Take 1 tablet by mouth daily.     nicotine  (NICODERM CQ  - DOSED IN MG/24 HOURS) 14 mg/24hr patch Place 1 patch (14 mg total) onto the skin daily. 28 patch 0   nystatin  (MYCOSTATIN ) 100000 UNIT/ML suspension Take 5 mLs (500,000 Units total) by mouth 4 (four) times daily. 60 mL 0   omeprazole  (PRILOSEC) 40 MG capsule Take 1 capsule (40 mg total) by mouth 2 (two) times daily with a meal.     ondansetron  (ZOFRAN ) 4 MG/2ML SOLN injection Inject 2 mLs (4 mg total) into the vein every 6 (six) hours as needed for vomiting or nausea. 2 mL 0   polyethylene glycol (MIRALAX  / GLYCOLAX ) 17 g packet Take 17 g by mouth 2 (two) times daily. 14 each 0   predniSONE  (DELTASONE ) 10 MG tablet Take 3 tablets (30 mg total) by mouth daily with breakfast.     senna (SENOKOT) 8.6 MG TABS tablet Take 2 tablets (17.2 mg total) by mouth at bedtime. 120 tablet 0   sertraline  (ZOLOFT ) 25 MG tablet Take 1 tablet (25 mg total) by mouth daily. 30 tablet 1   thiamine  (VITAMIN B-1) 100 MG tablet Take 1 tablet (100 mg total) by mouth daily.     traZODone  (DESYREL ) 50 MG tablet Take 1 tablet (50 mg total) by mouth at bedtime as needed for sleep.      Allergies as of 09/13/2023   (No Known Allergies)     Review of Systems:    All systems reviewed and negative except where noted in HPI.  Review of Systems  Reason unable to perform ROS: Mental status.       Physical Exam:  Vital signs in last 24 hours: Temp:  [97.9 F (36.6 C)-98.2 F (36.8 C)] 98.1 F (36.7 C) (04/26 1200) Pulse Rate:  [63-93] 77 (04/26 1200) Resp:  [12-28] 28 (04/26 1200) BP: (116-144)/(58-81) 120/71 (04/26 1200) SpO2:  [91 %-100 %] 93 % (04/26 1200)   General:   No acute distress Head:  Normocephalic and atraumatic. Eyes:   No icterus.   Conjunctiva pink. Ears:  Normal auditory acuity. Mouth: Mucosa pink moist, no lesions. Neck:  Supple; no masses felt Lungs: mild respiratory distress Abdomen:   Flat, soft, nondistended, nontender Rectal:  Not performed.  Msk:  Symmetrical without gross deformities. Neurologic:  Pill rolling tremor in left hand Skin:  Warm, dry, pink without significant lesions or rashes. Psych:  Flat affect  LAB  RESULTS: Recent Labs    09/13/23 0954 09/14/23 0910  WBC 6.3 9.6  HGB 13.3 13.2  HCT 41.1 39.9  PLT 278 259   BMET Recent Labs    09/13/23 0836 09/14/23 0910  NA 141 142  K 4.0 4.2  CL 106 106  CO2 28 24  GLUCOSE 122* 80  BUN 10 10  CREATININE 1.09 0.96  CALCIUM 9.7 9.4   LFT Recent Labs    09/13/23 0836  PROT 6.7  ALBUMIN 3.5  AST 33  ALT 11  ALKPHOS 61  BILITOT 0.9   PT/INR No results for input(s): "LABPROT", "INR" in the last 72 hours.  STUDIES: CT Angio Chest PE W/Cm &/Or Wo Cm Result Date: 09/13/2023 CLINICAL DATA:  Shortness of breath and hypoxia. Increased shortness of breath today. Productive cough for the past 24 hours. No subjective fever. COPD. Ex-smoker. EXAM: CT ANGIOGRAPHY CHEST WITH CONTRAST TECHNIQUE: Multidetector CT imaging of the chest was performed using the standard protocol during bolus administration of intravenous contrast. Multiplanar CT image reconstructions and MIPs were obtained to evaluate the vascular anatomy. RADIATION DOSE REDUCTION: This exam was performed according to the departmental dose-optimization program which includes automated exposure control, adjustment of the mA  and/or kV according to patient size and/or use of iterative reconstruction technique. CONTRAST:  75mL OMNIPAQUE  IOHEXOL  350 MG/ML SOLN COMPARISON:  Chest radiographs dated 08/11/2023 and 08/05/2023. Chest CTA dated 07/29/2023. FINDINGS: Cardiovascular: Atheromatous calcifications, including the coronary arteries and aorta. Normally opacified pulmonary arteries with no pulmonary arterial filling defects seen. Enlarged heart. No pericardial effusion. Mediastinum/Nodes: Stable diffusely enlarged esophagus with diffuse, concentric wall thickening. Unremarkable thyroid gland and trachea. Interval minimally enlarged right hilar nodes with short axis diameters of 10.4 mm on image number 74/4 9.5 mm on image number 76/4. No other enlarged lymph nodes. Lungs/Pleura: Extensive bilateral centrilobular bullous changes are again demonstrated. A large paraseptal bulla in the medial right middle lobe with adjacent compressive atelectasis or scarring is not changed significantly. Interval mild-to-moderate patchy, and small confluent densities are demonstrated in both lower lobes. No pleural fluid. The previously demonstrated stable 4 mm right apical nodule is less dense and does not have the appearance of the nodule today. Upper Abdomen: No acute abnormality. Musculoskeletal: Minimal thoracic and marked lower cervical spine degenerative changes. Review of the MIP images confirms the above findings. IMPRESSION: 1. No pulmonary embolus. 2. Interval mild-to-moderate patchy, and small areas of confluence densities in both lower lobes, most consistent with pneumonia. This could also represent an atypical appearance of atelectasis given the degree of emphysema the patient has. 3. Interval minimally enlarged right hilar nodes, most likely reactive. 4. Stable extensive bilateral centrilobular bullous changes. 5. Stable large paraseptal bulla in the medial right middle lobe with adjacent compressive atelectasis or scarring. 6. Stable  diffusely enlarged esophagus with diffuse, concentric wall thickening. This is most consistent with chronic reflux esophagitis. 7. Cardiomegaly. 8.  Calcific coronary artery and aortic atherosclerosis. Aortic Atherosclerosis (ICD10-I70.0) and Emphysema (ICD10-J43.9). Electronically Signed   By: Catherin Closs M.D.   On: 09/13/2023 16:56   CT Head Wo Contrast Result Date: 09/13/2023 CLINICAL DATA:  Altered mental status. EXAM: CT HEAD WITHOUT CONTRAST TECHNIQUE: Contiguous axial images were obtained from the base of the skull through the vertex without intravenous contrast. RADIATION DOSE REDUCTION: This exam was performed according to the departmental dose-optimization program which includes automated exposure control, adjustment of the mA and/or kV according to patient size and/or use of iterative reconstruction technique. COMPARISON:  Head CT dated 07/26/2022. FINDINGS: Brain: Mild age-related atrophy and chronic microvascular ischemic changes. There is no acute intracranial hemorrhage. No mass effect or midline shift. No extra-axial fluid collection. Vascular: No hyperdense vessel or unexpected calcification. Skull: Normal. Negative for fracture or focal lesion. Sinuses/Orbits: Mild mucoperiosteal thickening of paranasal sinuses. No air-fluid level. The mastoid air cells are clear. Other: None IMPRESSION: 1. No acute intracranial pathology. 2. Mild age-related atrophy and chronic microvascular ischemic changes. Electronically Signed   By: Angus Bark M.D.   On: 09/13/2023 14:10   DG Chest Portable 1 View Result Date: 09/13/2023 CLINICAL DATA:  Shortness of breath with exertion. EXAM: PORTABLE CHEST 1 VIEW COMPARISON:  08/11/2023 FINDINGS: Stable cardiomediastinal contours. Aortic atherosclerosis. Chronic changes of COPD/emphysema. Bibasilar opacities within both lung bases may reflect atelectasis or infiltrate. No significant pleural effusion or pneumothorax. Visualized osseous structures appear normal.  IMPRESSION: 1. Bibasilar opacities may reflect atelectasis or infiltrate. 2. Chronic changes of COPD/emphysema. Electronically Signed   By: Kimberley Penman M.D.   On: 09/13/2023 09:05       Impression / Plan:   68 y.o. gentleman with history of COPD, alcohol use, seizure disorder, and mental disability who is here with concerns of pneumonia, possibly aspiration with CT showing likely esophagitis and dilated esophagus.   - would benefit from EGD at some point, but would treat his pneumonia for a few days first before consider it - diet per speech - will likely make NPO Sunday night for consideration of EGD on Monday depending on evaluation - PPI daily  Will continue to follow, please call with any questions or concerns.  Olivia Bevel MD, MPH [facility]

## 2023-09-14 NOTE — Evaluation (Addendum)
 Clinical/Bedside Swallow Evaluation Patient Details  Name: Jeremiah Nguyen MRN: [id number] Date of Birth: 1955-12-22  Today's Date: 09/14/2023 Time: SLP Start Time (ACUTE ONLY): 1015 SLP Stop Time (ACUTE ONLY): 1050 SLP Time Calculation (min) (ACUTE ONLY): 35 min  Past Medical History:  Past Medical History:  Diagnosis Date   Acute metabolic encephalopathy 07/31/2023   AKI (acute kidney injury) (HCC) 07/31/2023   COPD (chronic obstructive pulmonary disease) (HCC)    Eating disorder    Epistaxis 08/04/2023   GERD (gastroesophageal reflux disease)    Influenza A with pneumonia 07/31/2023   Liver disease    Multifocal pneumonia 08/05/2023   Seizures (HCC)    Septic shock (HCC) 11/25/2014   Sleep apnea    Past Surgical History:  Past Surgical History:  Procedure Laterality Date   COLONOSCOPY WITH PROPOFOL  N/A 12/05/2020   Procedure: COLONOSCOPY WITH PROPOFOL ;  Surgeon: Shane Darling, MD;  Location: [facility] ENDOSCOPY;  Service: Endoscopy;  Laterality: N/A;   ESOPHAGOGASTRODUODENOSCOPY (EGD) WITH PROPOFOL  N/A 02/25/2018   Procedure: ESOPHAGOGASTRODUODENOSCOPY (EGD) WITH PROPOFOL ;  Surgeon: Luke Salaam, MD;  Location: [facility] ENDOSCOPY;  Service: Gastroenterology;  Laterality: N/A;   LESION EXCISION Left 12-15-13   follicular cyst   SKIN BIOPSY     HPI:  Per H&P: "Jeremiah Nguyen is a 68 y.o. male with medical history significant of COPD, alcohol use disorder, seizure disorder, recent admission for severe hypoxic respiratory failure requiring mechanical ventilation discharged from LTAC yesterday, who presents to the ED due to shortness of breath.     Jeremiah Nguyen tells me that he has been experiencing shortness of breath for a long time now and is uncertain if it got worse recently.  He denies any cough, rhinorrhea, congestion, fever, chills, chest pain, palpitations, lower extremity swelling.  When asked if he has been wearing his oxygen since arriving back to the group home, he states yes.      Per chart review, patient returned home from LTAC yesterday.  He was prescribed oxygen as needed but had not been wearing it.  EMS was called today after patient was noted to be short of breath with oxygen saturation of 88% on room air.  He was placed on 3 L with improvement to 94%.     Jeremiah Nguyen states that he occasionally vomits after he eats, however he cannot recall if he has any choking episodes while he eats or drinks.     Poor historian." CTA Chest:  No pulmonary embolus.  2. Interval mild-to-moderate patchy, and small areas of confluence  densities in both lower lobes, most consistent with pneumonia. This  could also represent an atypical appearance of atelectasis given the  degree of emphysema the patient has.  3. Interval minimally enlarged right hilar nodes, most likely  reactive.  4. Stable extensive bilateral centrilobular bullous changes.  5. Stable large paraseptal bulla in the medial right middle lobe  with adjacent compressive atelectasis or scarring.  6. Stable diffusely enlarged esophagus with diffuse, concentric wall  thickening. This is most consistent with chronic reflux esophagitis.  7. Cardiomegaly.  8.  Calcific coronary artery and aortic atherosclerosis.    Assessment / Plan / Recommendation  Clinical Impression  Pt seen for bedside swallow assessment in the setting of concern for aspiration PNA. 5L nasal canula placed at time of therapist arrival, with O2 saturations maintained at 94 t/o session. Pt seen with trials of thin liquids (via straw- single sip and consecutive), puree, and regular solids. No overt or subtle  s/sx pharyngeal dysphagia noted. No change to vocal quality across trials. Vitals stable for duration of trials (O2 remained at 94). Oral phase directly impacted by lack of dentition/dentures; however, pt able to manipulate and clear chopped consistent solids with aid of liquid wash and extended time. Belching noted intermittently during PO trials- suspect related to known  esophageal concerns (refer to CTA Chest results). Pt unable to clarify reported episodes of vomiting or consistency of reflux/globus sensation.     Based on age and current debility, respiratory/pulmonary status, suspected esophageal concerns- pt is at increased risk of aspiration, therefore recommend aspiration/esophageal precautions (slow rate, small bites, elevated HOB, and alert for PO intake). Maintain upright positioning at least 30 min following a meal. Allow extended time and provided liquid wash for solid clearance from oral cavity. Recommend Dys 2, chopped, and thin liquids. SLP will monitor for safety with current diet. Recommend GI/esophogeal assessment. MD and RN aware of recommendations.  SLP Visit Diagnosis: Dysphagia, unspecified (R13.10) (suspect esophageal impact; oral phase impacted by dentition)    Aspiration Risk  Moderate aspiration risk    Diet Recommendation   Dysphagia 2 (chopped);Thin  Medication Administration: Whole meds with puree    Other  Recommendations Recommended Consults: Consider GI evaluation;Consider esophageal assessment Oral Care Recommendations: Oral care BID;Staff/trained caregiver to provide oral care    Recommendations for follow up therapy are one component of a multi-disciplinary discharge planning process, led by the attending physician.  Recommendations may be updated based on patient status, additional functional criteria and insurance authorization.  Follow up Recommendations Follow physician's recommendations for discharge plan and follow up therapies      Assistance Recommended at Discharge  Intermittent supervision for compliance with aspiration precautions and upright positioning.   Functional Status Assessment Patient has had a recent decline in their functional status and demonstrates the ability to make significant improvements in function in a reasonable and predictable amount of time.  Frequency and Duration min 1 x/week  1 week        Prognosis Prognosis for improved oropharyngeal function: Fair Barriers to Reach Goals: Time post onset (re: esophageal concerns)      Swallow Study   General Date of Onset: 09/14/23 HPI: Per H&P: "Jeremiah Nguyen is a 68 y.o. male with medical history significant of COPD, alcohol use disorder, seizure disorder, recent admission for severe hypoxic respiratory failure requiring mechanical ventilation discharged from LTAC yesterday, who presents to the ED due to shortness of breath.     Jeremiah Nguyen tells me that he has been experiencing shortness of breath for a long time now and is uncertain if it got worse recently.  He denies any cough, rhinorrhea, congestion, fever, chills, chest pain, palpitations, lower extremity swelling.  When asked if he has been wearing his oxygen since arriving back to the group home, he states yes.     Per chart review, patient returned home from LTAC yesterday.  He was prescribed oxygen as needed but had not been wearing it.  EMS was called today after patient was noted to be short of breath with oxygen saturation of 88% on room air.  He was placed on 3 L with improvement to 94%.     Jeremiah Nguyen states that he occasionally vomits after he eats, however he cannot recall if he has any choking episodes while he eats or drinks.     Poor historian." CTA Chest:  No pulmonary embolus.  2. Interval mild-to-moderate patchy, and small areas of confluence  densities in both lower lobes, most consistent with pneumonia. This  could also represent an atypical appearance of atelectasis given the  degree of emphysema the patient has.  3. Interval minimally enlarged right hilar nodes, most likely  reactive.  4. Stable extensive bilateral centrilobular bullous changes.  5. Stable large paraseptal bulla in the medial right middle lobe  with adjacent compressive atelectasis or scarring.  6. Stable diffusely enlarged esophagus with diffuse, concentric wall  thickening. This is most consistent with chronic  reflux esophagitis.  7. Cardiomegaly.  8.  Calcific coronary artery and aortic atherosclerosis. Type of Study: Bedside Swallow Evaluation Previous Swallow Assessment: none in chart Diet Prior to this Study: Dysphagia 1 (pureed);Thin liquids (Level 0) Temperature Spikes Noted: No (Temp 98.2; WBC 9.6) Respiratory Status: Nasal cannula (5L) History of Recent Intubation: No (during last admission) Behavior/Cognition: Alert;Cooperative;Requires cueing Oral Cavity Assessment: Within Functional Limits Oral Care Completed by SLP: Yes Oral Cavity - Dentition: Edentulous (dentures not present for assessment) Vision: Functional for self-feeding Self-Feeding Abilities: Able to feed self;Needs set up (with extended time d/t tremor- set up provided) Patient Positioning: Upright in bed Baseline Vocal Quality: Normal Volitional Cough: Strong Volitional Swallow: Unable to elicit    Oral/Motor/Sensory Function Overall Oral Motor/Sensory Function: Within functional limits   Ice Chips Ice chips: Not tested   Thin Liquid Thin Liquid: Within functional limits Presentation: Straw;Self Fed (single and consecutive sips)    Nectar Thick Nectar Thick Liquid: Not tested   Honey Thick Honey Thick Liquid: Not tested   Puree Puree: Within functional limits Presentation: Spoon;Self Fed   Solid     Solid: Within functional limits (chopped consistent) Presentation: Spoon;Self Fed     [place] Adrena Nakamura Clapp, MS, CCC-SLP Speech Language Pathologist Rehab Services; [facility] [place] [phone] (ascom)   [place] J Clapp 09/14/2023,11:58 AM

## 2023-09-15 DIAGNOSIS — J9621 Acute and chronic respiratory failure with hypoxia: Secondary | ICD-10-CM | POA: Diagnosis not present

## 2023-09-15 LAB — BASIC METABOLIC PANEL WITH GFR
Anion gap: 7 (ref 5–15)
BUN: 8 mg/dL (ref 8–23)
CO2: 28 mmol/L (ref 22–32)
Calcium: 8.9 mg/dL (ref 8.9–10.3)
Chloride: 106 mmol/L (ref 98–111)
Creatinine, Ser: 0.89 mg/dL (ref 0.61–1.24)
GFR, Estimated: >60 mL/min (ref >60)
Glucose, Bld: 85 mg/dL (ref 70–99)
Potassium: 3.4 mmol/L — ABNORMAL LOW (ref 3.5–5.1)
Sodium: 141 mmol/L (ref 135–145)

## 2023-09-15 LAB — CBC WITH DIFFERENTIAL/PLATELET
Abs Immature Granulocytes: 0.03 10*3/uL (ref 0.00–0.07)
Basophils Absolute: 0.1 10*3/uL (ref 0.0–0.1)
Basophils Relative: 1 %
Eosinophils Absolute: 0 10*3/uL (ref 0.0–0.5)
Eosinophils Relative: 0 %
HCT: 36.6 % — ABNORMAL LOW (ref 39.0–52.0)
Hemoglobin: 12.3 g/dL — ABNORMAL LOW (ref 13.0–17.0)
Immature Granulocytes: 0 %
Lymphocytes Relative: 33 %
Lymphs Abs: 2.4 10*3/uL (ref 0.7–4.0)
MCH: 30.6 pg (ref 26.0–34.0)
MCHC: 33.6 g/dL (ref 30.0–36.0)
MCV: 91 fL (ref 80.0–100.0)
Monocytes Absolute: 0.7 10*3/uL (ref 0.1–1.0)
Monocytes Relative: 9 %
Neutro Abs: 4.1 10*3/uL (ref 1.7–7.7)
Neutrophils Relative %: 57 %
Platelets: 234 10*3/uL (ref 150–400)
RBC: 4.02 MIL/uL — ABNORMAL LOW (ref 4.22–5.81)
RDW: 16.8 % — ABNORMAL HIGH (ref 11.5–15.5)
WBC: 7.3 10*3/uL (ref 4.0–10.5)
nRBC: 0 % (ref 0.0–0.2)

## 2023-09-15 NOTE — Progress Notes (Signed)
 Progress Note   [name] ZOX:[id number] DOB: [birth year] DOA: 09/13/2023     2 DOS: the patient was seen and examined on 09/15/2023   Brief hospital course: 67yo with h/o COPD, ETOH use d/o, seizure d/o, and recent admission for severe hypoxic respiratory failure requiring mechanical ventilation discharged from LTAC -> SNF 1 day prior on prn home O2 who presented on 4/25 with SOB.  Concern for possible aspiration PNA, started antibiotics with plan for SLP evaluation.  SLP recommending GI consult.  GI planning EGD on 4/28.  Assessment and Plan:  Acute on chronic hypoxic respiratory failure  Shortness of breath occurring 1 day after being discharged from LTAC after a prolonged hospitalization for acute hypoxic respiratory failure requiring mechanical ventilation secondary to COPD, influenza, and then superimposed bacterial pneumonia Discharged on oxygen as needed, currently requiring 3 L   Abnormal appearing esophagus on CT -? secondary to aspiration; likely to have EGD on 4/28 Continue supplemental oxygen to maintain oxygen saturation above 88% Wean as tolerated   Aspiration pneumonia Distant history of oral phase dysphagia noted in 2019 Vancomycin , Rocephin , and azithromycin  x 1 -> Unasyn  Guaifenesin  twice daily SLP evaluation -> Dysphagia 2 diet, chopped, thin liquids Also recommending GI consult (requested) Continue PPI and H2 blocker Continue thorazine  and baclofen  (hiccoughs) It sounds like his respiratory issues can be controlled at the family care home once the esophageal issues are addressed   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 Continue with steroids and DuoNebs for now Solu-Medrol  125 mg -> prednisone  40 mg daily to complete a 5-day course DuoNebs every 6 hours Continue Breo, Incruse   Seizures  Continue lamotrigine    DM Recent A1c 6.0, good control Hold Jardiance   HTN Continue Toprol  XL   Mood d/o Continue sertraline , trazodone         Consultants: GI SLP PT OT TOC team   Procedures: None   Antibiotics: Azithromycin  x 1 Vancomycin  x 1 Cefepime  x 1 Unasyn  4/25-  30 Day Unplanned Readmission Risk Score    Flowsheet Row ED to Hosp-Admission (Current) from 09/13/2023 in [facility]  30 Day Unplanned Readmission Risk Score (%) 21.85 Filed at 09/15/2023 0401       This score is the patient's risk of an unplanned readmission within 30 days of being discharged (0 -100%). The score is based on dignosis, age, lab data, medications, orders, and past utilization.   Low:  0-14.9   Medium: 15-21.9   High: 22-29.9   Extreme: 30 and above           Subjective: Not wearing O2 at the time of our encounter and appeared comfortable.  When this was brought to his attention, he placed it back on.  No specific complaints.   Objective: Vitals:   09/15/23 0630 09/15/23 0801  BP:  127/72  Pulse:  (!) 116  Resp:    Temp:  98.6 F (37 C)  SpO2: 92% 92%    Intake/Output Summary (Last 24 hours) at 09/15/2023 1330 Last data filed at 09/15/2023 1046 Gross per 24 hour  Intake 120 ml  Output 300 ml  Net -180 ml   Filed Weights   09/14/23 1259  Weight: 60.8 kg    Exam:  General:  Appears calm and comfortable and is in NAD, on [place] O2 Eyes:   normal lids, iris ENT:  grossly normal hearing, lips & tongue, mmm Cardiovascular:  RRR, no m/r/g. No LE edema.  Respiratory:   CTA bilaterally  with no wheezes/rales/rhonchi.  Normal respiratory effort. Abdomen:  soft, NT, ND Skin:  no rash or induration seen on limited exam Musculoskeletal:  generalized weakness, no bony abnormality Psychiatric:  blunted mood and affect, speech sparse and difficulty to hear/understand Neurologic:  nonfocal exam  Data Reviewed: I have reviewed the patient's lab results since admission.  Pertinent labs for today include:   K+ 3.4 WBC 7.3 Hgb 12.3     Family Communication: None present  Disposition: Status is:  Inpatient Remains inpatient appropriate because: ongoing monitoring     Time spent: 35 minutes  Unresulted Labs (From admission, onward)     Start     Ordered   09/16/23 0500  CBC with Differential/Platelet  Tomorrow morning,   R       Question:  Specimen collection method  Answer:  IV Team=IV Team collect   09/15/23 1329   09/16/23 0500  Procalcitonin  Tomorrow morning,   R       References:    Procalcitonin Lower Respiratory Tract Infection AND Sepsis Procalcitonin Algorithm  Question:  Specimen collection method  Answer:  IV Team=IV Team collect   09/15/23 1329   09/16/23 0500  Basic metabolic panel with GFR  Tomorrow morning,   R       Question:  Specimen collection method  Answer:  IV Team=IV Team collect   09/15/23 1329   09/13/23 0836  CBC with Differential  Once,   STAT        09/13/23 1610             Author: Lorita Rosa, MD 09/15/2023 1:30 PM  For on call review [email or web address].

## 2023-09-15 NOTE — Evaluation (Signed)
 Occupational Therapy Evaluation Patient Details Name: Jeremiah Nguyen MRN: [id number] DOB: 01/17/1956 Today's Date: 09/15/2023   History of Present Illness   Pt is a 68 y/o M admitted on  09/13/23 after presenting with c/o SOB. Pt is being treated for acute on chronic hypoxic respiratory failure, aspiration PNA. PMH: COPD, EtOH use, seizure disorder, recent admission for severe hypoxic respiratory failure requiring mechanical ventilation & d/c from LTAC 1 day prior to presenting to ED     Clinical Impressions Patient presenting with decreased Ind in self care,balance, functional mobility/transfers, endurance, and safety awareness. Patient reports being Mod I at baseline and living at group home. Cognitive deficits at baseline and not family or staff present to confirm baseline. Patient currently functioning at min A overall for mobility with 1 LOB needing mod - max A to correct for safety during side steps along EOB with RW.  Patient will benefit from acute OT to increase overall independence in the areas of ADLs, functional mobility, and safety awareness in order to safely discharge.      If plan is discharge home, recommend the following:   A lot of help with walking and/or transfers;A lot of help with bathing/dressing/bathroom;Assistance with cooking/housework;Assist for transportation;Help with stairs or ramp for entrance     Functional Status Assessment   Patient has had a recent decline in their functional status and demonstrates the ability to make significant improvements in function in a reasonable and predictable amount of time.     Equipment Recommendations   None recommended by OT      Precautions/Restrictions   Precautions Precautions: Fall     Mobility Bed Mobility Overal bed mobility: Needs Assistance Bed Mobility: Supine to Sit     Supine to sit: Supervision, HOB elevated, Used rails          Transfers Overall transfer level: Needs  assistance Equipment used: Rolling walker (2 wheels) Transfers: Sit to/from Stand Sit to Stand: Min assist           General transfer comment: cuing for hand placement with improvement during sit>stand but pt still does not reach back for surface during stand>sit      Balance Overall balance assessment: Needs assistance Sitting-balance support: Feet supported Sitting balance-Leahy Scale: Fair     Standing balance support: Bilateral upper extremity supported, During functional activity, Reliant on assistive device for balance Standing balance-Leahy Scale: Poor                             ADL either performed or assessed with clinical judgement   ADL Overall ADL's : Needs assistance/impaired                         Toilet Transfer: Maximal assistance                   Vision Patient Visual Report: No change from baseline              Pertinent Vitals/Pain Pain Assessment Pain Assessment: No/denies pain     Extremity/Trunk Assessment Upper Extremity Assessment Upper Extremity Assessment:  (L UE tremor at rest)   Lower Extremity Assessment Lower Extremity Assessment:  (only able to achieve BLE dorsiflexion to neutral, unable to perform PROM 2/2 tightness in joints)       Communication Communication Communication: Impaired Factors Affecting Communication: Difficulty expressing self   Cognition Arousal: Alert Behavior During Therapy: [facility] for tasks  assessed/performed Cognition: History of cognitive impairments                               Following commands: Intact       Cueing  General Comments   Cueing Techniques: Verbal cues  Pt received with nasal cannula out of nose, pt attempting to take it off again during session. PT educated pt on importance of wearing supplemental O2 at all times (pt asking about taking it off during gait). SpO2 >/= 90% with 3L/min.           Home Living Family/patient expects to  be discharged to:: Group home Living Arrangements: Group Home Available Help at Discharge: Available 24 hours/day Type of Home: Group Home       Home Layout: One level                   Additional Comments: Pt is a poor historian      Prior Functioning/Environment Prior Level of Function : Independent/Modified Independent;Patient poor historian/Family not available             Mobility Comments: Pt is a poor historian, reports he doesn't use AD then states he does use RW. No one in room to confirm. ADLs Comments: Per chart review, pt was recently at LTAC and returned to group home. No one present to confirm. He endorses being Ind in self care and mobility.    OT Problem List: Decreased strength;Decreased activity tolerance;Decreased safety awareness;Impaired balance (sitting and/or standing);Decreased knowledge of use of DME or AE   OT Treatment/Interventions: Self-care/ADL training;Therapeutic exercise;Therapeutic activities;Energy conservation;DME and/or AE instruction;Patient/family education;Balance training      OT Goals(Current goals can be found in the care plan section)   Acute Rehab OT Goals Patient Stated Goal: to go home OT Goal Formulation: With patient Time For Goal Achievement: 09/29/23 Potential to Achieve Goals: Fair ADL Goals Pt Will Perform Grooming: with modified independence;standing Pt Will Perform Lower Body Dressing: with modified independence;sit to/from stand Pt Will Transfer to Toilet: with modified independence Pt Will Perform Toileting - Clothing Manipulation and hygiene: with modified independence   OT Frequency:  Min 2X/week       AM-PAC OT "6 Clicks" Daily Activity     Outcome Measure Help from another person eating meals?: None Help from another person taking care of personal grooming?: A Little Help from another person toileting, which includes using toliet, bedpan, or urinal?: A Lot Help from another person bathing  (including washing, rinsing, drying)?: A Lot Help from another person to put on and taking off regular upper body clothing?: A Little Help from another person to put on and taking off regular lower body clothing?: A Lot 6 Click Score: 16   End of Session Equipment Utilized During Treatment: Rolling walker (2 wheels) Nurse Communication: Mobility status  Activity Tolerance: Patient tolerated treatment well Patient left: in bed;with call bell/phone within reach;with bed alarm set  OT Visit Diagnosis: Unsteadiness on feet (R26.81);Repeated falls (R29.6);Muscle weakness (generalized) (M62.81)                Time: 7829-5621 OT Time Calculation (min): 18 min Charges:  OT General Charges $OT Visit: 1 Visit OT Evaluation $OT Eval Moderate Complexity: 1 Mod OT Treatments $Self Care/Home Management : 8-22 mins  George Kinder, MS, OTR/L , CBIS ascom [phone]  09/15/23, 3:36 PM

## 2023-09-15 NOTE — Progress Notes (Signed)
 GI Inpatient Follow-up Note  Subjective:  Patient seen and is stable. Denies any trouble swallowing. Appears somewhat short of breath.  Scheduled Inpatient Medications:   aspirin   81 mg Oral Daily   baclofen   10 mg Oral QID   benzonatate   100 mg Oral TID   chlorproMAZINE   50 mg Oral QID   enoxaparin  (LOVENOX ) injection  40 mg Subcutaneous Q24H   famotidine  20 mg Oral Daily   fluticasone  furoate-vilanterol  1 puff Inhalation Daily   guaiFENesin   600 mg Oral BID   ipratropium-albuterol   3 mL Nebulization BID   lamoTRIgine   150 mg Oral BID   metoprolol  succinate  25 mg Oral Daily   pantoprazole   40 mg Oral BID   polyethylene glycol  17 g Oral BID   predniSONE   40 mg Oral Q breakfast   senna  2 tablet Oral QHS   sertraline   25 mg Oral Daily   sodium chloride  flush  3 mL Intravenous Q12H   [START ON 09/16/2023] umeclidinium bromide   1 puff Inhalation Daily    Continuous Inpatient Infusions:    ampicillin -sulbactam (UNASYN ) IV 3 g (09/15/23 0855)    PRN Inpatient Medications:  acetaminophen  **OR** acetaminophen , ondansetron  **OR** ondansetron  (ZOFRAN ) IV, polyethylene glycol, traZODone   Review of Systems:  Review of Systems  Constitutional:  Negative for chills and fever.  Respiratory:  Negative for shortness of breath.   Cardiovascular:  Negative for chest pain.  Gastrointestinal:  Negative for blood in stool.  Musculoskeletal:  Negative for joint pain.  Skin:  Negative for rash.  Neurological:  Negative for focal weakness.  Psychiatric/Behavioral:  Negative for substance abuse.   All other systems reviewed and are negative.     Physical Examination: BP 127/72 (BP Location: Right Arm)   Pulse (!) 116   Temp 98.6 F (37 C) (Oral)   Resp 17   Ht 5\' 6"  (1.676 m)   Wt 60.8 kg   SpO2 92%   BMI 21.63 kg/m  Gen: NAD, appears HEENT: PEERLA, EOMI, Neck: supple, no JVD or thyromegaly Chest: appears mildly short of breath Abd: soft, non-tender, non-distended Ext: no  edema, well perfused with 2+ pulses, Skin: no rash or lesions noted Lymph: no LAD  Data: Lab Results  Component Value Date   WBC 7.3 09/15/2023   HGB 12.3 (L) 09/15/2023   HCT 36.6 (L) 09/15/2023   MCV 91.0 09/15/2023   PLT 234 09/15/2023   Recent Labs  Lab 09/13/23 0954 09/14/23 0910 09/15/23 0420  HGB 13.3 13.2 12.3*   Lab Results  Component Value Date   NA 141 09/15/2023   K 3.4 (L) 09/15/2023   CL 106 09/15/2023   CO2 28 09/15/2023   BUN 8 09/15/2023   CREATININE 0.89 09/15/2023   Lab Results  Component Value Date   ALT 11 09/13/2023   AST 33 09/13/2023   GGT 60 (H) 10/31/2018   ALKPHOS 61 09/13/2023   BILITOT 0.9 09/13/2023   No results for input(s): "APTT", "INR", "PTT" in the last 168 hours. Assessment/Plan: Jeremiah Nguyen is a 68 y.o. gentleman with history of COPD, alcohol use, seizure disorder, and mental disability who is here with concerns of pneumonia, possibly aspiration with CT showing likely esophagitis and dilated esophagus.   Recommendations:  - would benefit from EGD, but would treat his pneumonia for a few days first  - diet per speech - will re-evaluate tomorrow to best determine timing as he appears short of breath today - PPI daily  Dr. Ole Berkeley will start care tomorrow. Please call with any questions or concerns.  Jeremiah Bevel MD, MPH [facility] GI

## 2023-09-15 NOTE — Plan of Care (Signed)

## 2023-09-15 NOTE — Evaluation (Signed)
 Physical Therapy Evaluation Patient Details Name: Jeremiah Nguyen MRN: [id number] DOB: 1956/03/05 Today's Date: 09/15/2023  History of Present Illness  Pt is a 68 y/o M admitted on  09/13/23 after presenting with c/o SOB. Pt is being treated for acute on chronic hypoxic respiratory failure, aspiration PNA. PMH: COPD, EtOH use, seizure disorder, recent admission for severe hypoxic respiratory failure requiring mechanical ventilation & d/c from LTAC 1 day prior to presenting to ED  Clinical Impression  Pt seen for PT evaluation with pt agreeable. Pt AxOx3, but with decreased awareness & memory as pt with O2 out of nose & again removing it during PT session. PT educated pt on importance of nasal cannula in nose at all times. Pt is able to complete bed mobility with supervision with use of bed rails. Pt requires cuing re: hand placement during transfers with fair improvement. Pt ambulates in room with RW & min assist with impaired gait pattern as noted below, requiring cuing re: proper positioning with RW. Pt presents with LUE tremor & BLE ataxia during gait. Pt would benefit from ongoing PT treatment to focus on balance, safety & gait with LRAD.      If plan is discharge home, recommend the following: A little help with walking and/or transfers;A little help with bathing/dressing/bathroom;Assistance with cooking/housework;Assist for transportation;Help with stairs or ramp for entrance   Can travel by private vehicle   Yes    Equipment Recommendations Rolling walker (2 wheels)  Recommendations for Other Services       Functional Status Assessment Patient has had a recent decline in their functional status and demonstrates the ability to make significant improvements in function in a reasonable and predictable amount of time.     Precautions / Restrictions Precautions Precautions: Fall Restrictions Weight Bearing Restrictions Per Provider Order: No      Mobility  Bed Mobility Overal bed  mobility: Needs Assistance Bed Mobility: Supine to Sit     Supine to sit: Supervision, HOB elevated, Used rails (exits R side of bed)          Transfers Overall transfer level: Needs assistance Equipment used: Rolling walker (2 wheels) Transfers: Sit to/from Stand Sit to Stand: Min assist           General transfer comment: cuing for hand placement with improvement during sit>stand but pt still does not reach back for surface during stand>sit    Ambulation/Gait Ambulation/Gait assistance: Min assist Gait Distance (Feet): 10 Feet (+ 22 ft) Assistive device: Rolling walker (2 wheels) Gait Pattern/deviations: Decreased step length - right, Decreased stride length, Decreased step length - left, Step-through pattern, Ataxic Gait velocity: decreased     General Gait Details: Increased RLE hip/knee flexion during swing phase, decreased heel strike LLE, absent heel strike RLE, cuing but poor return demo to ambulate within base of AD when turning & to not push it too far out in front  Stairs            Wheelchair Mobility     Tilt Bed    Modified Rankin (Stroke Patients Only)       Balance Overall balance assessment: Needs assistance Sitting-balance support: Feet supported Sitting balance-Leahy Scale: Fair Sitting balance - Comments: supervision static sitting   Standing balance support: Bilateral upper extremity supported, During functional activity, Reliant on assistive device for balance Standing balance-Leahy Scale: Poor  Pertinent Vitals/Pain Pain Assessment Pain Assessment: No/denies pain    Home Living Family/patient expects to be discharged to:: Group home                   Additional Comments: Pt is a poor historian, reports someone is there at all times.    Prior Function               Mobility Comments: Pt is a poor historian, reports he doesn't use AD then states he does use RW. No one in  room to confirm.       Extremity/Trunk Assessment   Upper Extremity Assessment Upper Extremity Assessment:  (LUE tremor noted at rest)    Lower Extremity Assessment Lower Extremity Assessment:  (only able to achieve BLE dorsiflexion to neutral, unable to perform PROM 2/2 tightness in joints)       Communication   Communication Communication: Impaired Factors Affecting Communication: Difficulty expressing self    Cognition Arousal: Alert Behavior During Therapy: WFL for tasks assessed/performed   PT - Cognitive impairments: Difficult to assess                       PT - Cognition Comments: Pt is oriented to self, time, place. Poor historian. Following commands: Intact       Cueing Cueing Techniques: Verbal cues     General Comments General comments (skin integrity, edema, etc.): Pt received with nasal cannula out of nose, pt attempting to take it off again during session. PT educated pt on importance of wearing supplemental O2 at all times (pt asking about taking it off during gait). SpO2 >/= 90% with 3L/min.    Exercises     Assessment/Plan    PT Assessment Patient needs continued PT services  PT Problem List Decreased strength       PT Treatment Interventions DME instruction;Balance training;Gait training;Neuromuscular re-education;Stair training;Therapeutic activities;Functional mobility training;Patient/family education;Modalities;Therapeutic exercise    PT Goals (Current goals can be found in the Care Plan section)  Acute Rehab PT Goals Patient Stated Goal: none stated PT Goal Formulation: With patient Time For Goal Achievement: 09/29/23 Potential to Achieve Goals: Good    Frequency Min 2X/week     Co-evaluation               AM-PAC PT "6 Clicks" Mobility  Outcome Measure Help needed turning from your back to your side while in a flat bed without using bedrails?: None Help needed moving from lying on your back to sitting on the side  of a flat bed without using bedrails?: A Little Help needed moving to and from a bed to a chair (including a wheelchair)?: A Little Help needed standing up from a chair using your arms (e.g., wheelchair or bedside chair)?: A Little Help needed to walk in hospital room?: A Little Help needed climbing 3-5 steps with a railing? : A Lot 6 Click Score: 18    End of Session Equipment Utilized During Treatment: Oxygen Activity Tolerance: Patient tolerated treatment well Patient left: in chair;with call bell/phone within reach;with chair alarm set Nurse Communication: Mobility status PT Visit Diagnosis: Ataxic gait (R26.0);Other abnormalities of gait and mobility (R26.89);Muscle weakness (generalized) (M62.81);Unsteadiness on feet (R26.81);Difficulty in walking, not elsewhere classified (R26.2)    Time: 0981-1914 PT Time Calculation (min) (ACUTE ONLY): 14 min   Charges:   PT Evaluation $PT Eval Low Complexity: 1 Low   PT General Charges $$ ACUTE PT VISIT: 1 Visit  Emaline Handsome, PT, DPT 09/15/23, 2:52 PM   Venetta Gill 09/15/2023, 2:47 PM

## 2023-09-16 DIAGNOSIS — F1010 Alcohol abuse, uncomplicated: Secondary | ICD-10-CM

## 2023-09-16 DIAGNOSIS — R933 Abnormal findings on diagnostic imaging of other parts of digestive tract: Secondary | ICD-10-CM | POA: Diagnosis not present

## 2023-09-16 DIAGNOSIS — J9621 Acute and chronic respiratory failure with hypoxia: Secondary | ICD-10-CM | POA: Diagnosis not present

## 2023-09-16 LAB — CBC WITH DIFFERENTIAL/PLATELET
Abs Immature Granulocytes: 0.03 10*3/uL (ref 0.00–0.07)
Basophils Absolute: 0.1 10*3/uL (ref 0.0–0.1)
Basophils Relative: 1 %
Eosinophils Absolute: 0 10*3/uL (ref 0.0–0.5)
Eosinophils Relative: 0 %
HCT: 38.6 % — ABNORMAL LOW (ref 39.0–52.0)
Hemoglobin: 13 g/dL (ref 13.0–17.0)
Immature Granulocytes: 0 %
Lymphocytes Relative: 26 %
Lymphs Abs: 2.5 10*3/uL (ref 0.7–4.0)
MCH: 30.1 pg (ref 26.0–34.0)
MCHC: 33.7 g/dL (ref 30.0–36.0)
MCV: 89.4 fL (ref 80.0–100.0)
Monocytes Absolute: 0.9 10*3/uL (ref 0.1–1.0)
Monocytes Relative: 10 %
Neutro Abs: 6.1 10*3/uL (ref 1.7–7.7)
Neutrophils Relative %: 63 %
Platelets: 241 10*3/uL (ref 150–400)
RBC: 4.32 MIL/uL (ref 4.22–5.81)
RDW: 16.7 % — ABNORMAL HIGH (ref 11.5–15.5)
WBC: 9.6 10*3/uL (ref 4.0–10.5)
nRBC: 0 % (ref 0.0–0.2)

## 2023-09-16 LAB — PROCALCITONIN: Procalcitonin: <0.1 ng/mL

## 2023-09-16 LAB — BASIC METABOLIC PANEL WITH GFR
Anion gap: 11 (ref 5–15)
BUN: 13 mg/dL (ref 8–23)
CO2: 30 mmol/L (ref 22–32)
Calcium: 9.2 mg/dL (ref 8.9–10.3)
Chloride: 105 mmol/L (ref 98–111)
Creatinine, Ser: 1.01 mg/dL (ref 0.61–1.24)
GFR, Estimated: >60 mL/min (ref >60)
Glucose, Bld: 88 mg/dL (ref 70–99)
Potassium: 3.5 mmol/L (ref 3.5–5.1)
Sodium: 146 mmol/L — ABNORMAL HIGH (ref 135–145)

## 2023-09-16 NOTE — Plan of Care (Signed)
  Problem: Clinical Measurements: Goal: Diagnostic test results will improve Outcome: Progressing   Problem: Activity: Goal: Risk for activity intolerance will decrease Outcome: Progressing   Problem: Coping: Goal: Level of anxiety will decrease Outcome: Progressing   Problem: Safety: Goal: Ability to remain free from injury will improve Outcome: Progressing   

## 2023-09-16 NOTE — Progress Notes (Signed)
 Jeremiah Sink, MD [facility]   [address], [address] [place], [place] [address] Phone: [phone] Fax : [phone]   Subjective: The patient was sitting up in bed being helped by the nurse when I went to see the patient this morning he is not able to answer many questions and is still requiring oxygen.  The patient's hemoglobin yesterday was 12.3 and today is 13.0.  The patient was found to have stable diffusely enlarged esophagus with diffuse concentric wall thickening most consistent with chronic reflux esophagitis.  It was thought that he may have his present issues due to aspiration.   Objective: Vital signs in last 24 hours: Vitals:   09/15/23 1940 09/16/23 0422 09/16/23 0801 09/16/23 0831  BP: 134/77 (!) 156/78  132/71  Pulse: 95 96  92  Resp: 18 16    Temp: 98.4 F (36.9 C) 98.2 F (36.8 C)  98 F (36.7 C)  TempSrc:  Oral    SpO2: 95% 96% 92% (!) 89%  Weight:      Height:       Weight change:   Intake/Output Summary (Last 24 hours) at 09/16/2023 6962 Last data filed at 09/16/2023 0200 Gross per 24 hour  Intake 120 ml  Output 300 ml  Net -180 ml     Exam: Heart:: Regular rate and rhythm or without murmur or extra heart sounds Lungs: normal and clear to auscultation and percussion Abdomen: soft, nontender, normal bowel sounds   Lab Results: @LABTEST2 @ Micro Results: Recent Results (from the past 240 hours)  Resp panel by RT-PCR (RSV, Flu A&B, Covid) Anterior Nasal Swab     Status: None   Collection Time: 09/13/23  9:13 AM   Specimen: Anterior Nasal Swab  Result Value Ref Range Status   SARS Coronavirus 2 by RT PCR NEGATIVE NEGATIVE Final    Comment: (NOTE) SARS-CoV-2 target nucleic acids are NOT DETECTED.  The SARS-CoV-2 RNA is generally detectable in upper respiratory specimens during the acute phase of infection. The lowest concentration of SARS-CoV-2 viral copies this assay can detect is 138 copies/mL. A negative result does not preclude  SARS-Cov-2 infection and should not be used as the sole basis for treatment or other patient management decisions. A negative result may occur with  improper specimen collection/handling, submission of specimen other than nasopharyngeal swab, presence of viral mutation(s) within the areas targeted by this assay, and inadequate number of viral copies(<138 copies/mL). A negative result must be combined with clinical observations, patient history, and epidemiological information. The expected result is Negative.  Fact Sheet for Patients:  BloggerCourse.com  Fact Sheet for Healthcare Providers:  SeriousBroker.it  This test is no t yet approved or cleared by the [place]  FDA and  has been authorized for detection and/or diagnosis of SARS-CoV-2 by FDA under an Emergency Use Authorization (EUA). This EUA will remain  in effect (meaning this test can be used) for the duration of the COVID-19 declaration under Section 564(b)(1) of the Act, 21 U.S.C.section 360bbb-3(b)(1), unless the authorization is terminated  or revoked sooner.       Influenza A by PCR NEGATIVE NEGATIVE Final   Influenza B by PCR NEGATIVE NEGATIVE Final    Comment: (NOTE) The Xpert Xpress SARS-CoV-2/FLU/RSV plus assay is intended as an aid in the diagnosis of influenza from Nasopharyngeal swab specimens and should not be used as a sole basis for treatment. Nasal washings and aspirates are unacceptable for Xpert Xpress SARS-CoV-2/FLU/RSV testing.  Fact Sheet for Patients: BloggerCourse.com  Fact  Sheet for Healthcare Providers: SeriousBroker.it  This test is not yet approved or cleared by the [place]  FDA and has been authorized for detection and/or diagnosis of SARS-CoV-2 by FDA under an Emergency Use Authorization (EUA). This EUA will remain in effect (meaning this test can be used) for the duration of  the COVID-19 declaration under Section 564(b)(1) of the Act, 21 U.S.C. section 360bbb-3(b)(1), unless the authorization is terminated or revoked.     Resp Syncytial Virus by PCR NEGATIVE NEGATIVE Final    Comment: (NOTE) Fact Sheet for Patients: BloggerCourse.com  Fact Sheet for Healthcare Providers: SeriousBroker.it  This test is not yet approved or cleared by the [place]  FDA and has been authorized for detection and/or diagnosis of SARS-CoV-2 by FDA under an Emergency Use Authorization (EUA). This EUA will remain in effect (meaning this test can be used) for the duration of the COVID-19 declaration under Section 564(b)(1) of the Act, 21 U.S.C. section 360bbb-3(b)(1), unless the authorization is terminated or revoked.  Performed at [facility], [address] [place], [place] [address]   Culture, blood (routine x 2)     Status: None (Preliminary result)   Collection Time: 09/13/23  9:55 AM   Specimen: BLOOD  Result Value Ref Range Status   Specimen Description BLOOD RIGHT ANTECUBITAL  Final   Special Requests   Final    BOTTLES DRAWN AEROBIC AND ANAEROBIC Blood Culture results may not be optimal due to an inadequate volume of blood received in culture bottles   Culture   Final    NO GROWTH 3 DAYS Performed at [facility], [address] [place], [place] [address]    Report Status PENDING  Incomplete  Culture, blood (routine x 2)     Status: None (Preliminary result)   Collection Time: 09/13/23  9:55 AM   Specimen: BLOOD RIGHT ARM  Result Value Ref Range Status   Specimen Description BLOOD RIGHT ARM  Final   Special Requests   Final    BOTTLES DRAWN AEROBIC AND ANAEROBIC Blood Culture results may not be optimal due to an inadequate volume of blood received in culture bottles   Culture   Final    NO GROWTH 3 DAYS Performed at [facility], [address] [place], [place] [address]     Report Status PENDING  Incomplete   Studies/Results: No results found. Medications: I have reviewed the patient's current medications. Scheduled Meds:  aspirin   81 mg Oral Daily   baclofen   10 mg Oral QID   benzonatate   100 mg Oral TID   chlorproMAZINE   50 mg Oral QID   enoxaparin  (LOVENOX ) injection  40 mg Subcutaneous Q24H   famotidine  20 mg Oral Daily   fluticasone  furoate-vilanterol  1 puff Inhalation Daily   guaiFENesin   600 mg Oral BID   ipratropium-albuterol   3 mL Nebulization BID   lamoTRIgine   150 mg Oral BID   metoprolol  succinate  25 mg Oral Daily   pantoprazole   40 mg Oral BID   polyethylene glycol  17 g Oral BID   predniSONE   40 mg Oral Q breakfast   senna  2 tablet Oral QHS   sertraline   25 mg Oral Daily   sodium chloride  flush  3 mL Intravenous Q12H   umeclidinium bromide   1 puff Inhalation Daily   Continuous Infusions:  ampicillin -sulbactam (UNASYN ) IV 3 g (09/16/23 0231)   PRN Meds:.acetaminophen  **OR** acetaminophen , ondansetron  **OR** ondansetron  (ZOFRAN ) IV, polyethylene glycol, traZODone    Assessment: Principal  Problem:   Acute on chronic hypoxic respiratory failure (HCC) Active Problems:   Seizures (HCC)   Aspiration pneumonia (HCC)   Bullous emphysema (HCC)    Plan: This patient has a history of COPD with seizures and alcohol abuse who came in with possible aspiration on CT scan and esophagitis with a dilated esophagus.  The plan would be to proceed with an upper endoscopy when the patient's respiratory status allows.  He appears improved today although not able to answer many questions.  I will contact his sister today and plan for the optimal time for his upper endoscopy.   LOS: 3 days   Jeremiah Sink, MD.FACG 09/16/2023, 9:22 AM Pager [phone] 7am-5pm  Check AMION for 5pm -7am coverage and on weekends

## 2023-09-16 NOTE — Care Management Important Message (Signed)
 Important Message  Patient Details  Name: Jeremiah Nguyen MRN: [id number] Date of Birth: Aug 26, 1955   Important Message Given:  Yes - Medicare IM     Woody Kronberg W, CMA 09/16/2023, 11:31 AM

## 2023-09-16 NOTE — Progress Notes (Signed)
 Progress Note   [name] [id number]:[id number] DOB: 1955-09-28 DOA: 09/13/2023     3 DOS: the patient was seen and examined on 09/16/2023   Brief hospital course: 67yo with h/o COPD, ETOH use d/o, seizure d/o, and recent admission for severe hypoxic respiratory failure requiring mechanical ventilation discharged from LTAC -> SNF 1 day prior on prn home O2 who presented on 4/25 with SOB.  Concern for possible aspiration PNA, started antibiotics with plan for SLP evaluation.  SLP recommending GI consult.  GI planning EGD on 4/29.  Assessment and Plan:  Acute on chronic hypoxic respiratory failure  Shortness of breath occurring 1 day after being discharged from LTAC -> SNF after a prolonged hospitalization for acute hypoxic respiratory failure requiring mechanical ventilation secondary to COPD, influenza, and then superimposed bacterial pneumonia Discharged on oxygen as needed, currently requiring 3 L   Abnormal appearing esophagus on CT -? secondary to aspiration; likely to have EGD on 4/28 Continue supplemental oxygen to maintain oxygen saturation above 88% Wean as tolerated   Aspiration pneumonia Distant history of oral phase dysphagia noted in 2019 Vancomycin , Rocephin , and azithromycin  x 1 -> Unasyn  Guaifenesin  twice daily SLP evaluation -> Dysphagia 2 diet, chopped, thin liquids Also recommending GI consult (requested) Continue PPI and H2 blocker Continue thorazine  and baclofen  (hiccoughs) It sounds like his respiratory issues can be controlled at the family care home once the esophageal issues are addressed This issue is stable but is likely to continue to be an issue until the esophageal issue is addressed so awaiting GI to perform EGD (scheduled for 4/29)   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 Continue with steroids and DuoNebs for now Solu-Medrol  125 mg -> prednisone  40 mg daily to complete a 5-day course DuoNebs every 6 hours Continue Breo, Incruse   Seizures   Continue lamotrigine    DM Recent A1c 6.0, good control Hold Jardiance   HTN Continue Toprol  XL   Mood d/o Continue sertraline , trazodone        Consultants: GI SLP PT OT TOC team   Procedures: None   Antibiotics: Azithromycin  x 1 Vancomycin  x 1 Cefepime  x 1 Unasyn  4/25-    30 Day Unplanned Readmission Risk Score    Flowsheet Row ED to Hosp-Admission (Current) from 09/13/2023 in [facility] (1A)  30 Day Unplanned Readmission Risk Score (%) 22.2 Filed at 09/16/2023 0801       This score is the patient's risk of an unplanned readmission within 30 days of being discharged (0 -100%). The score is based on dignosis, age, lab data, medications, orders, and past utilization.   Low:  0-14.9   Medium: 15-21.9   High: 22-29.9   Extreme: 30 and above           Subjective: Feeling better.  No complaints.  Not wearing his O2.  Denies respiratory complaints.   Objective: Vitals:   09/16/23 0801 09/16/23 0831  BP:  132/71  Pulse:  92  Resp:    Temp:  98 F (36.7 C)  SpO2: 92% (!) 89%    Intake/Output Summary (Last 24 hours) at 09/16/2023 1411 Last data filed at 09/16/2023 0954 Gross per 24 hour  Intake --  Output 750 ml  Net -750 ml   Filed Weights   09/14/23 1259  Weight: 60.8 kg    Exam:  General:  Appears calm and comfortable and is in NAD, on [place] O2 Eyes:   normal lids, iris ENT:  grossly normal hearing, lips &  tongue, mmm Cardiovascular:  RRR, no m/r/g. No LE edema.  Respiratory:   CTA bilaterally with no wheezes/rales/rhonchi.  Normal respiratory effort. 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  Pertinent labs for today  include:   Na++ 146 Stable CBC     Family Communication: None present; I spoke with his sister by telephone  Disposition: Status is: Inpatient Remains inpatient appropriate because: ongoing monitoring     Time spent: 50 minutes  Unresulted Labs (From admission, onward)     Start     Ordered   09/17/23 0500  Basic metabolic panel with GFR  Tomorrow morning,   R       Question:  Specimen collection method  Answer:  IV Team=IV Team collect   09/16/23 1411   09/13/23 0836  CBC with Differential  Once,   STAT        09/13/23 4098             Author: Lorita Rosa, MD 09/16/2023 2:11 PM  For on call review [email or web address].

## 2023-09-16 NOTE — Progress Notes (Signed)
 Physical Therapy Treatment Patient Details Name: Jeremiah Nguyen MRN: [id number] DOB: 1955/10/18 Today's Date: 09/16/2023   History of Present Illness Pt is a 68 y/o M admitted on  09/13/23 after presenting with c/o SOB. Pt is being treated for acute on chronic hypoxic respiratory failure, aspiration PNA. PMH: COPD, EtOH use, seizure disorder, recent admission for severe hypoxic respiratory failure requiring mechanical ventilation & d/c from LTAC 1 day prior to presenting to ED    PT Comments  Pt resting in bed upon PT arrival with his nasal cannula off; pt's SpO2 sats 81% on room air; therapist had pt put nasal cannula back on and pt's SpO2 sats 93% on 1 L O2 via nasal cannula end of session at rest (nurse notified).  During session pt was SBA with bed mobility; min assist standing from bed (x2 trials) up to RW; min assist to steady taking steps next to bed with RW use (max assist to prevent loss of balance backwards); deferred further mobility d/t safety concerns.  Will continue to focus on strengthening, balance, and progressive functional mobility during hospitalization.   If plan is discharge home, recommend the following: A little help with walking and/or transfers;A little help with bathing/dressing/bathroom;Assistance with cooking/housework;Assist for transportation;Help with stairs or ramp for entrance   Can travel by private vehicle        Equipment Recommendations  Rolling walker (2 wheels)    Recommendations for Other Services       Precautions / Restrictions Precautions Precautions: Fall Restrictions Weight Bearing Restrictions Per Provider Order: No     Mobility  Bed Mobility Overal bed mobility: Needs Assistance Bed Mobility: Supine to Sit, Sit to Supine     Supine to sit: Supervision, HOB elevated, Used rails Sit to supine: Supervision, HOB elevated, Used rails   General bed mobility comments: SBA for safety    Transfers Overall transfer level: Needs  assistance Equipment used: Rolling walker (2 wheels) Transfers: Sit to/from Stand Sit to Stand: Min assist           General transfer comment: vc's for UE placement and safety    Ambulation/Gait   Gait Distance (Feet):  (pt took a couple steps next to bed with min assist but limited d/t loss of balance backwards requiring max assist to correct) Assistive device: Rolling walker (2 wheels) Gait Pattern/deviations: Ataxic Gait velocity: decreased     General Gait Details: unsteady; min assist to steady; limited d/t loss of balance backwards requiring max assist to correct   Stairs             Wheelchair Mobility     Tilt Bed    Modified Rankin (Stroke Patients Only)       Balance Overall balance assessment: Needs assistance Sitting-balance support: No upper extremity supported, Feet supported Sitting balance-Leahy Scale: Good Sitting balance - Comments: steady reaching within BOS   Standing balance support: Bilateral upper extremity supported, During functional activity, Reliant on assistive device for balance Standing balance-Leahy Scale: Poor Standing balance comment: assist for balance with standing activities                            Communication Communication Communication: Impaired Factors Affecting Communication: Difficulty expressing self  Cognition Arousal: Alert Behavior During Therapy: WFL for tasks assessed/performed   PT - Cognitive impairments: Difficult to assess  PT - Cognition Comments: A&O to person, place, and general situation.  Generalized confusion noted. Following commands: Intact      Cueing Cueing Techniques: Verbal cues  Exercises      General Comments  Nursing cleared pt for participation in physical therapy.  Pt agreeable to PT session.      Pertinent Vitals/Pain Pain Assessment Pain Assessment: No/denies pain HR 92-100 bpm during sessions activities.    Home Living                           Prior Function            PT Goals (current goals can now be found in the care plan section) Acute Rehab PT Goals Patient Stated Goal: none stated PT Goal Formulation: With patient Time For Goal Achievement: 09/29/23 Potential to Achieve Goals: Good Progress towards PT goals: Progressing toward goals    Frequency    Min 2X/week      PT Plan      Co-evaluation              AM-PAC PT "6 Clicks" Mobility   Outcome Measure  Help needed turning from your back to your side while in a flat bed without using bedrails?: None Help needed moving from lying on your back to sitting on the side of a flat bed without using bedrails?: A Little Help needed moving to and from a bed to a chair (including a wheelchair)?: A Little Help needed standing up from a chair using your arms (e.g., wheelchair or bedside chair)?: A Little Help needed to walk in hospital room?: A Lot Help needed climbing 3-5 steps with a railing? : A Lot 6 Click Score: 17    End of Session Equipment Utilized During Treatment: Gait belt;Oxygen Activity Tolerance: Patient tolerated treatment well Patient left: in bed;with call bell/phone within reach;with bed alarm set Nurse Communication: Mobility status;Precautions PT Visit Diagnosis: Ataxic gait (R26.0);Other abnormalities of gait and mobility (R26.89);Muscle weakness (generalized) (M62.81);Unsteadiness on feet (R26.81);Difficulty in walking, not elsewhere classified (R26.2)     Time: 1610-9604 PT Time Calculation (min) (ACUTE ONLY): 17 min  Charges:    $Therapeutic Activity: 8-22 mins PT General Charges $$ ACUTE PT VISIT: 1 Visit                     Amador Junes, PT 09/16/23, 3:57 PM

## 2023-09-16 NOTE — Plan of Care (Signed)
   Problem: Health Behavior/Discharge Planning: Goal: Ability to manage health-related needs will improve Outcome: Progressing   Problem: Clinical Measurements: Goal: Ability to maintain clinical measurements within normal limits will improve Outcome: Progressing

## 2023-09-16 NOTE — Progress Notes (Addendum)
 Speech Language Pathology Treatment: Dysphagia  Patient Details Name: Jeremiah Nguyen MRN: [id number] DOB: Jan 29, 1956 Today's Date: 09/16/2023 Time: 1000-1030 SLP Time Calculation (min) (ACUTE ONLY): 30 min  Assessment / Plan / Recommendation Clinical Impression  Pt seen for dysphagia intervention, targeting trials of advanced solids and compensatory strategy training. Pt seen with trials of thin liquids (via straw), mech soft solids, and puree solids. Pt able to self feed with set up provided. Verbal cues required for rationale for upright positioning during and after PO intake. Single cough noted following consecutive bites of puree, not repeated with single bites/sips. Verbal cues/removing cup/spoon required to slow intake. Belching continued following solids- GI is on board. Oral phase mildly prolonged with mech soft solids, likely impacted by edentulous status. Verbal cues required for use of liquid wash to aid oral clearance.   Recommend continuation of chopped solids and thin liquids. Given current respiratory/pulmonary status, concern for esophageal component, deconditioning, and impulsivity, recommend aspiration/esophogeal precautions, including slow rate, small bites, elevated HOB during/after intake, and alert for intake. Pt is able to self feed (with extended time d/t tremor) with supervision recommended secondary to impulsivity.     HPI HPI: Per H&P: "THANOS CAPAN is a 68 y.o. male with medical history significant of COPD, alcohol use disorder, seizure disorder, recent admission for severe hypoxic respiratory failure requiring mechanical ventilation discharged from LTAC yesterday, who presents to the ED due to shortness of breath.     Mr. Dingledine tells me that he has been experiencing shortness of breath for a long time now and is uncertain if it got worse recently.  He denies any cough, rhinorrhea, congestion, fever, chills, chest pain, palpitations, lower extremity swelling.  When asked if  he has been wearing his oxygen since arriving back to the group home, he states yes.     Per chart review, patient returned home from LTAC yesterday.  He was prescribed oxygen as needed but had not been wearing it.  EMS was called today after patient was noted to be short of breath with oxygen saturation of 88% on room air.  He was placed on 3 L with improvement to 94%.     Mr. Sahr states that he occasionally vomits after he eats, however he cannot recall if he has any choking episodes while he eats or drinks.     Poor historian." CTA Chest:  No pulmonary embolus.  2. Interval mild-to-moderate patchy, and small areas of confluence  densities in both lower lobes, most consistent with pneumonia. This  could also represent an atypical appearance of atelectasis given the  degree of emphysema the patient has.  3. Interval minimally enlarged right hilar nodes, most likely  reactive.  4. Stable extensive bilateral centrilobular bullous changes.  5. Stable large paraseptal bulla in the medial right middle lobe  with adjacent compressive atelectasis or scarring.  6. Stable diffusely enlarged esophagus with diffuse, concentric wall  thickening. This is most consistent with chronic reflux esophagitis.  7. Cardiomegaly.  8.  Calcific coronary artery and aortic atherosclerosis.      SLP Plan  Continue with current plan of care      Recommendations for follow up therapy are one component of a multi-disciplinary discharge planning process, led by the attending physician.  Recommendations may be updated based on patient status, additional functional criteria and insurance authorization.    Recommendations  Diet recommendations: Dysphagia 2 (fine chop);Thin liquid Liquids provided via: Straw;Cup Medication Administration: Whole meds with puree Supervision: Patient able  to self feed;Full supervision/cueing for compensatory strategies Compensations: Minimize environmental distractions;Slow rate;Small sips/bites;Follow  solids with liquid Postural Changes and/or Swallow Maneuvers: Seated upright 90 degrees;Upright 30-60 min after meal                  Oral care BID;Staff/trained caregiver to provide oral care   Frequent or constant Supervision/Assistance Dysphagia, unspecified (R13.10) (suspect esophageal impact; oral phase impacted by dentition)     Continue with current plan of care    [place] Mairany Bruno Clapp, MS, CCC-SLP Speech Language Pathologist Rehab Services; [facility] [phone] (ascom)   [place] J Clapp  09/16/2023, 11:08 AM

## 2023-09-17 ENCOUNTER — Inpatient Hospital Stay: Admitting: Anesthesiology

## 2023-09-17 ENCOUNTER — Encounter
Admission: EM | Disposition: A | Discharge status: Home Health | Payer: Self-pay | Source: Home / Self Care | Attending: Internal Medicine

## 2023-09-17 ENCOUNTER — Encounter: Payer: Self-pay | Admitting: Internal Medicine

## 2023-09-17 DIAGNOSIS — J9621 Acute and chronic respiratory failure with hypoxia: Secondary | ICD-10-CM | POA: Diagnosis not present

## 2023-09-17 DIAGNOSIS — K449 Diaphragmatic hernia without obstruction or gangrene: Secondary | ICD-10-CM

## 2023-09-17 DIAGNOSIS — K209 Esophagitis, unspecified without bleeding: Secondary | ICD-10-CM | POA: Diagnosis not present

## 2023-09-17 LAB — BASIC METABOLIC PANEL WITH GFR
Anion gap: 9 (ref 5–15)
BUN: 14 mg/dL (ref 8–23)
CO2: 27 mmol/L (ref 22–32)
Calcium: 9.4 mg/dL (ref 8.9–10.3)
Chloride: 105 mmol/L (ref 98–111)
Creatinine, Ser: 0.91 mg/dL (ref 0.61–1.24)
GFR, Estimated: >60 mL/min (ref >60)
Glucose, Bld: 99 mg/dL (ref 70–99)
Potassium: 3.5 mmol/L (ref 3.5–5.1)
Sodium: 141 mmol/L (ref 135–145)

## 2023-09-17 SURGERY — EGD (ESOPHAGOGASTRODUODENOSCOPY)
Anesthesia: General

## 2023-09-17 MED ORDER — PROPOFOL 10 MG/ML IV BOLUS
INTRAVENOUS | Status: DC | PRN
  Administered 2023-09-17: 20 mg via INTRAVENOUS
  Administered 2023-09-17: 60 mg via INTRAVENOUS

## 2023-09-17 MED ORDER — AMOXICILLIN-POT CLAVULANATE 875-125 MG PO TABS
1.0000 | ORAL_TABLET | Freq: Two times a day (BID) | ORAL | Status: AC
  Administered 2023-09-17 – 2023-09-19 (×5): 1 via ORAL
  Filled 2023-09-17 (×5): qty 1

## 2023-09-17 MED ORDER — LIDOCAINE HCL (CARDIAC) PF 100 MG/5ML IV SOSY
PREFILLED_SYRINGE | INTRAVENOUS | Status: DC | PRN
  Administered 2023-09-17: 60 mg via INTRAVENOUS

## 2023-09-17 MED ORDER — SODIUM CHLORIDE 0.9 % IV SOLN: INTRAVENOUS | Status: DC | PRN

## 2023-09-17 MED ORDER — PROPOFOL 500 MG/50ML IV EMUL
INTRAVENOUS | Status: DC | PRN
  Administered 2023-09-17: 145 ug/kg/min via INTRAVENOUS

## 2023-09-17 MED ORDER — GLYCOPYRROLATE 0.2 MG/ML IJ SOLN
INTRAMUSCULAR | Status: DC | PRN
  Administered 2023-09-17: .2 mg via INTRAVENOUS

## 2023-09-17 NOTE — Anesthesia Preprocedure Evaluation (Addendum)
 Anesthesia Evaluation  Patient identified by MRN, date of birth, ID band Patient awake  General Assessment Comment:Pt was confused as to what procedure was planned for today. The patient has suspected dementia.  Reviewed: Allergy & Precautions, H&P , NPO status , Patient's Chart, lab work & pertinent test results  Airway Mallampati: III  TM Distance: >3 FB Neck ROM: full    Dental  (+) Edentulous Upper, Edentulous Lower   Pulmonary sleep apnea , pneumonia, COPD, former smoker CT ANGIO: 4/25: IMPRESSION: 1. No pulmonary embolus. 2. Interval mild-to-moderate patchy, and small areas of confluence densities in both lower lobes, most consistent with pneumonia. This could also represent an atypical appearance of atelectasis given the degree of emphysema the patient has. 3. Interval minimally enlarged right hilar nodes, most likely reactive. 4. Stable extensive bilateral centrilobular bullous changes. 5. Stable large paraseptal bulla in the medial right middle lobe with adjacent compressive atelectasis or scarring. 6. Stable diffusely enlarged esophagus with diffuse, concentric wall thickening. This is most consistent with chronic reflux esophagitis. 7. Cardiomegaly. 8.  Calcific coronary artery and aortic atherosclerosis.   Carteret  + rhonchi        Cardiovascular negative cardio ROS Normal cardiovascular exam     Neuro/Psych Seizures -, Well Controlled,   negative psych ROS   GI/Hepatic ,GERD  ,,(+)     substance abuse  alcohol use  Endo/Other  negative endocrine ROS    Renal/GU   negative genitourinary   Musculoskeletal   Abdominal Normal abdominal exam  (+)   Peds  Hematology negative hematology ROS (+)   Anesthesia Other Findings PER IM NOTE: Acute on chronic hypoxic respiratory failure  Shortness of breath occurring 1 day after being discharged from LTAC -> SNF after a prolonged hospitalization for acute  hypoxic respiratory failure requiring mechanical ventilation secondary to COPD, influenza, and then superimposed bacterial pneumonia Discharged on oxygen as needed, currently requiring 3 L   Abnormal appearing esophagus on CT -? secondary to aspiration; likely to have EGD on 4/28 Continue supplemental oxygen to maintain oxygen saturation above 88% Wean as tolerated He may need home O2 at the time of dc   Aspiration pneumonia Distant history of oral phase dysphagia noted in 2019 Vancomycin , Rocephin , and azithromycin  x 1 -> Unasyn  Guaifenesin  twice daily SLP evaluation -> Dysphagia 2 diet, chopped, thin liquids Also recommending GI consult (requested) Continue PPI and H2 blocker Continue thorazine  and baclofen  (hiccoughs) It sounds like his respiratory issues can be controlled at the family care home once the esophageal issues are addressed This issue is stable but is likely to continue to be an issue until the esophageal issue is addressed so awaiting GI to perform EGD (scheduled for 4/29)   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 Continue with steroids and DuoNebs for now Solu-Medrol  125 mg -> prednisone  40 mg daily to complete a 5-day course DuoNebs every 6 hours Continue Breo, Incruse   Seizures  Continue lamotrigine    DM Recent A1c 6.0, good control Hold Jardiance   HTN Continue Toprol  XL      Past Medical History: 07/31/2023: Acute metabolic encephalopathy 07/31/2023: AKI (acute kidney injury) (HCC) No date: COPD (chronic obstructive pulmonary disease) (HCC) No date: Eating disorder 08/04/2023: Epistaxis No date: GERD (gastroesophageal reflux disease) 07/31/2023: Influenza A with pneumonia No date: Liver disease 08/05/2023: Multifocal pneumonia No date: Seizures (HCC) 11/25/2014: Septic shock (HCC) No date: Sleep apnea  Past Surgical History: 12/05/2020: COLONOSCOPY WITH PROPOFOL ; N/A     Comment:  Procedure:  COLONOSCOPY WITH PROPOFOL ;  Surgeon:                Shane Darling, MD;  Location: [facility] ENDOSCOPY;                Service: Endoscopy;  Laterality: N/A; 02/25/2018: ESOPHAGOGASTRODUODENOSCOPY (EGD) WITH PROPOFOL ; N/A     Comment:  Procedure: ESOPHAGOGASTRODUODENOSCOPY (EGD) WITH               PROPOFOL ;  Surgeon: Luke Salaam, MD;  Location: [facility]               ENDOSCOPY;  Service: Gastroenterology;  Laterality: N/A; 12-15-13: LESION EXCISION; Left     Comment:  follicular cyst No date: SKIN BIOPSY  BMI    Body Mass Index: 21.63 kg/m      Reproductive/Obstetrics negative OB ROS                             Anesthesia Physical Anesthesia Plan  ASA: 4  Anesthesia Plan: General   Post-op Pain Management: Minimal or no pain anticipated   Induction: Intravenous  PONV Risk Score and Plan: Propofol  infusion and TIVA  Airway Management Planned: Natural Airway  Additional Equipment:   Intra-op Plan:   Post-operative Plan:   Informed Consent: I have reviewed the patients History and Physical, chart, labs and discussed the procedure including the risks, benefits and alternatives for the proposed anesthesia with the patient or authorized representative who has indicated his/her understanding and acceptance.     Dental Advisory Given  Plan Discussed with: CRNA and Surgeon  Anesthesia Plan Comments:         Anesthesia Quick Evaluation

## 2023-09-17 NOTE — Plan of Care (Signed)
  Problem: Clinical Measurements: Goal: Diagnostic test results will improve Outcome: Progressing   Problem: Activity: Goal: Risk for activity intolerance will decrease Outcome: Progressing   Problem: Coping: Goal: Level of anxiety will decrease Outcome: Progressing   Problem: Safety: Goal: Ability to remain free from injury will improve Outcome: Progressing   

## 2023-09-17 NOTE — Anesthesia Procedure Notes (Signed)
 Procedure Name: General with mask airway Date/Time: 09/17/2023 12:50 PM  Performed by: Niki Barter, CRNAPre-anesthesia Checklist: Patient identified, Emergency Drugs available, Suction available and Patient being monitored Patient Re-evaluated:Patient Re-evaluated prior to induction Oxygen Delivery Method: Simple face mask Induction Type: IV induction Placement Confirmation: positive ETCO2 and breath sounds checked- equal and bilateral Dental Injury: Teeth and Oropharynx as per pre-operative assessment

## 2023-09-17 NOTE — Progress Notes (Signed)
 This patient had an EGD today for a dilated esophagus with abnormal findings on the CT scan.  The patient's esophagus had some distal esophagitis but no narrowing strictures or dilation.  The patient's stomach and small bowel were also normal.  The patient did have a hiatal hernia.  Nothing further to do from a GI point of view except treat him for esophagitis with a PPI daily for the next 8 weeks.  I will sign off.  Please call if any further GI concerns or questions.  We would like to thank you for the opportunity to participate in the care of Jeremiah Nguyen.

## 2023-09-17 NOTE — Progress Notes (Signed)
 OT Cancellation Note  Patient Details Name: ARMANNI HADAWAY MRN: [id number] DOB: 1955/07/18   Cancelled Treatment:    Reason Eval/Treat Not Completed: Patient at procedure or test/ unavailable. Pt out of the room. Per chart, scheduled for EGD. Will re-attempt at later date/time as pt is available.   Tania Perrott R., MPH, MS, OTR/L ascom [phone] 09/17/23, 1:31 PM

## 2023-09-17 NOTE — Transfer of Care (Signed)
 Immediate Anesthesia Transfer of Care Note  [name]  Procedure(s) Performed: EGD (ESOPHAGOGASTRODUODENOSCOPY)  Patient Location: Endoscopy Unit  Anesthesia Type:General  Level of Consciousness: drowsy and patient cooperative  Airway & Oxygen Therapy: Patient Spontanous Breathing and Patient connected to face mask oxygen  Post-op Assessment: Report given to RN and Post -op Vital signs reviewed and stable  Post vital signs: Reviewed and stable  Last Vitals:  Vitals Value Taken Time  BP 112/63 09/17/23 1259  Temp 36.4 C 09/17/23 1259  Pulse 106 09/17/23 1306  Resp 27 09/17/23 1306  SpO2 96 % 09/17/23 1306  Vitals shown include unfiled device data.  Last Pain:  Vitals:   09/17/23 1259  TempSrc: Temporal  PainSc: 0-No pain      Patients Stated Pain Goal: 0 (09/17/23 0747)  Complications: No notable events documented.

## 2023-09-17 NOTE — Progress Notes (Signed)
 Progress Note   [name] NFA:[id number] DOB: November 17, 1955 DOA: 09/13/2023     4 DOS: the patient was seen and examined on 09/17/2023   Brief hospital course: 67yo with h/o COPD, ETOH use d/o, seizure d/o, and recent admission for severe hypoxic respiratory failure requiring mechanical ventilation discharged from LTAC -> SNF 1 day prior on prn home O2 who presented on 4/25 with SOB.  Concern for possible aspiration PNA, started antibiotics with plan for SLP evaluation.  SLP recommending GI consult.  GI planning EGD on 4/29.  Assessment and Plan:  Acute on chronic hypoxic respiratory failure  Shortness of breath occurring 1 day after being discharged from LTAC -> SNF after a prolonged hospitalization for acute hypoxic respiratory failure requiring mechanical ventilation secondary to COPD, influenza, and then superimposed bacterial pneumonia Discharged on oxygen as needed, currently requiring 3 L   Abnormal appearing esophagus on CT -? secondary to aspiration; likely to have EGD on 4/28 Continue supplemental oxygen to maintain oxygen saturation above 88% Wean as tolerated He is likely to need home O2 at the time of dc He is medically stable for dc but is recommended for SNF rehab; given his recent LTAC -> SNF rehab, I have asked TOC team to see if he has remaining days Anticipate dc back to family care home tomorrow vs. Waiting for SNF rehab placement   Aspiration pneumonia Distant history of oral phase dysphagia noted in 2019 Vancomycin , Rocephin , and azithromycin  x 1 -> Unasyn  -> Augmentin Guaifenesin  twice daily Continue thorazine  and baclofen  (hiccoughs) It sounds like his respiratory issues can be controlled at the family care home once the esophageal issues are addressed  Dysphagia SLP evaluation -> Dysphagia 2 diet, chopped, thin liquids Also recommending GI consult (requested) Continue PPI and H2 blocker EGD on 4/39 showed distal esophagitis and a hiatal hernia He is  recommended for PPI daily x 8 weeks GI is signed off   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 Continue with steroids and DuoNebs for now Solu-Medrol  125 mg -> prednisone  40 mg daily to complete a 5-day course DuoNebs every 6 hours Continue Breo, Incruse Ambulatory pulse ox scheduled for 4/30   Seizures  Continue lamotrigine    DM Recent A1c 6.0, good control Hold Jardiance   HTN Continue Toprol  XL   Mood d/o Continue sertraline , trazodone        Consultants: GI SLP PT OT TOC team   Procedures: None   Antibiotics: Azithromycin  x 1 Vancomycin  x 1 Cefepime  x 1 Unasyn  4/25-29 Augmentin 4/29-5/2     30 Day Unplanned Readmission Risk Score    Flowsheet Row ED to Hosp-Admission (Current) from 09/13/2023 in [facility] (1A)  30 Day Unplanned Readmission Risk Score (%) 22.5 Filed at 09/17/2023 0801       This score is the patient's risk of an unplanned readmission within 30 days of being discharged (0 -100%). The score is based on dignosis, age, lab data, medications, orders, and past utilization.   Low:  0-14.9   Medium: 15-21.9   High: 22-29.9   Extreme: 30 and above           Subjective: Feeling ok.  Seen prior to EGD, no concerns.   Objective: Vitals:   09/17/23 1400 09/17/23 1500  BP: (!) 148/82 134/77  Pulse: 96 90  Resp: 18 18  Temp: 97.6 F (36.4 C) 97.7 F (36.5 C)  SpO2: 90% 90%    Intake/Output Summary (Last 24 hours) at 09/17/2023 1709  Last data filed at 09/17/2023 1440 Gross per 24 hour  Intake 2019.39 ml  Output 1250 ml  Net 769.39 ml   Filed Weights   09/14/23 1259  Weight: 60.8 kg    Exam:   General:  Appears calm and comfortable and is in NAD, on Ali Molina O2 Eyes:   normal lids, iris ENT:  grossly normal hearing, lips & tongue, mmm Cardiovascular:  RRR, no m/r/g. No LE edema.  Respiratory:   CTA bilaterally with no wheezes/rales/rhonchi.  Normal respiratory effort. Abdomen:  soft, NT,  ND Skin:  no rash or induration seen on limited exam Musculoskeletal:  generalized weakness, no bony abnormality Psychiatric:  blunted mood and affect, speech appropriate but limited, has underlying MCI Neurologic:  nonfocal exam  Data Reviewed: I have reviewed the patient's lab results since admission.  Pertinent labs for today include:   Normal BMP     Family Communication: None present  Disposition: Status is: Inpatient Remains inpatient appropriate because: ongoing management     Time spent: 35 minutes  Unresulted Labs (From admission, onward)     Start     Ordered   09/13/23 0836  CBC with Differential  Once,   STAT        09/13/23 7829             Author: Lorita Rosa, MD 09/17/2023 5:09 PM  For on call review [email or web address].

## 2023-09-17 NOTE — Op Note (Signed)
 [facility] Patient Name: Jeremiah Nguyen Procedure Date: 09/17/2023 12:47 PM MRN: [id number] Account #: [id number] Date of Birth: 20-Sep-1955 Admit Type: Outpatient Age: 68 Room: [facility] ENDO ROOM 4 Gender: Male Note Status: Finalized Instrument Name: Upper Endoscope [id number] Procedure:             Upper GI endoscopy Indications:           Abnormal CT of the GI tract Providers:             Marnee Sink MD, MD Medicines:             Propofol  per Anesthesia Complications:         No immediate complications. Procedure:             Pre-Anesthesia Assessment:                        - Prior to the procedure, a History and Physical was                         performed, and patient medications and allergies were                         reviewed. The patient's tolerance of previous                         anesthesia was also reviewed. The risks and benefits                         of the procedure and the sedation options and risks                         were discussed with the patient. All questions were                         answered, and informed consent was obtained. Prior                         Anticoagulants: The patient has taken no anticoagulant                         or antiplatelet agents. ASA Grade Assessment: II - A                         patient with mild systemic disease. After reviewing                         the risks and benefits, the patient was deemed in                         satisfactory condition to undergo the procedure.                        After obtaining informed consent, the endoscope was                         passed under direct vision. Throughout the procedure,  the patient's blood pressure, pulse, and oxygen                         saturations were monitored continuously. The Endoscope                         was introduced through the mouth, and advanced to the                         second part  of duodenum. The upper GI endoscopy was                         accomplished without difficulty. The patient tolerated                         the procedure well. Findings:      LA Grade C (one or more mucosal breaks continuous between tops of 2 or       more mucosal folds, less than 75% circumference) esophagitis with no       bleeding was found in the lower third of the esophagus.      A medium-sized hiatal hernia was present.      The stomach was normal.      The examined duodenum was normal. Impression:            - LA Grade C esophagitis with no bleeding.                        - Medium-sized hiatal hernia.                        - Normal stomach.                        - Normal examined duodenum.                        - No specimens collected. Recommendation:        - Return patient to hospital ward for ongoing care.                        - Resume previous diet.                        - Continue present medications. Procedure Code(s):     --- Professional ---                        [phone], Esophagogastroduodenoscopy, flexible,                         transoral; diagnostic, including collection of                         specimen(s) by brushing or washing, when performed                         (separate procedure) Diagnosis Code(s):     --- Professional ---                        R93.3, Abnormal findings on diagnostic imaging  of                         other parts of digestive tract                        K20.90, Esophagitis, unspecified without bleeding CPT copyright 2022 American Medical Association. All rights reserved. The codes documented in this report are preliminary and upon coder review may  be revised to meet current compliance requirements. Marnee Sink MD, MD 09/17/2023 12:56:22 PM This report has been signed electronically. Number of Addenda: 0 Note Initiated On: 09/17/2023 12:47 PM Estimated Blood Loss:  Estimated blood loss: none.      [facility]

## 2023-09-18 DIAGNOSIS — J9621 Acute and chronic respiratory failure with hypoxia: Secondary | ICD-10-CM | POA: Diagnosis not present

## 2023-09-18 LAB — CULTURE, BLOOD (ROUTINE X 2)
Culture: NO GROWTH
Culture: NO GROWTH

## 2023-09-18 NOTE — Progress Notes (Addendum)
 Speech Language Pathology Treatment: Dysphagia  Patient Details Name: Jeremiah Nguyen MRN: [id number] DOB: [birth year] Today's Date: 09/18/2023 Time: 0930-0950 SLP Time Calculation (min) (ACUTE ONLY): 20 min  Assessment / Plan / Recommendation Clinical Impression  Jeremiah Nguyen appears to be tolerating modified diet (dysphagia 2 chopped/ thin liquids) with application of compensatory strategy use focused on slow rate, small bolus volume and use of liquid wash following every few bites. Currently requiring mod A cues (verbal, tactile) to employ. Reduced retention of strategies at end of session with teach back and unable to utilize visual written aid d/t reported inability to read. Ongoing training warranted to increase successful application and reduced caregiver cues.   EGD findings noted grade 3 esophagitis and moderate hiatal hernia. Clinical signs of delayed coughing and frequent belching also present during PO intake this date. Extensive education on behavioral refluxing/ esophageal precautions provided; however, unable to retain and written visual aid ineffective. Continues to benefit from re-education.   Jeremiah Nguyen is noted to have a baseline history of cognitive impairment (per EMR neurology note) which will negatively impact his success with new learning retention and carryover. As such, he will require ongoing direct supervision and assistance both within current hospitalization and upon discharge to maximize safety with application of strategy use.  Jeremiah Nguyen continues to be at an increased aspiration risk, especially post-prandially due to known esophageal dysfunction. Strongly encourage application of both aspiration precautions and refluxing precautions (see below). Ensure setup assist and supervision during meals to provide cues to employ strategy application. SLP to continue to follow to address above with focus on education of caregivers/staff.   Aspiration precautions: small bolus volume,  slow rate of intake, liquid wash  Esophageal precautions: slow rate of intake, smaller more frequent meals, follow bites of food with liquids, remain upright for 30-60 minutes after eating, do not eat/drink 2-3 hours before bedtime except water , elevate head of bed at least 30 degrees     HPI HPI: Per H&P: "Jeremiah Nguyen is a 68 y.o. male with medical history significant of COPD, alcohol use disorder, seizure disorder, recent admission for severe hypoxic respiratory failure requiring mechanical ventilation discharged from LTAC yesterday, who presents to the ED due to shortness of breath.     Jeremiah Nguyen tells me that he has been experiencing shortness of breath for a long time now and is uncertain if it got worse recently.  He denies any cough, rhinorrhea, congestion, fever, chills, chest pain, palpitations, lower extremity swelling.  When asked if he has been wearing his oxygen since arriving back to the group home, he states yes.     Per chart review, patient returned home from LTAC yesterday.  He was prescribed oxygen as needed but had not been wearing it.  EMS was called today after patient was noted to be short of breath with oxygen saturation of 88% on room air.  He was placed on 3 L with improvement to 94%.     Jeremiah Nguyen states that he occasionally vomits after he eats, however he cannot recall if he has any choking episodes while he eats or drinks.     Poor historian." CTA Chest:  No pulmonary embolus.  2. Interval mild-to-moderate patchy, and small areas of confluence  densities in both lower lobes, most consistent with pneumonia. This  could also represent an atypical appearance of atelectasis given the  degree of emphysema the patient has.  3. Interval minimally enlarged right hilar nodes, most likely  reactive.  4. Stable extensive bilateral centrilobular bullous changes.  5. Stable large paraseptal bulla in the medial right middle lobe  with adjacent compressive atelectasis or scarring.  6. Stable  diffusely enlarged esophagus with diffuse, concentric wall  thickening. This is most consistent with chronic reflux esophagitis.  7. Cardiomegaly.  8.  Calcific coronary artery and aortic atherosclerosis.   Clinical swallow evaluation 09/16/23: Pt seen for bedside swallow assessment in the setting of concern for aspiration PNA. 5L nasal canula placed at time of therapist arrival, with O2 saturations maintained at 94 t/o session. Pt seen with trials of thin liquids (via straw- single sip and consecutive), puree, and regular solids. No overt or subtle s/sx pharyngeal dysphagia noted. No change to vocal quality across trials. Vitals stable for duration of trials (O2 remained at 94). Oral phase directly impacted by lack of dentition/dentures; however, pt able to manipulate and clear chopped consistent solids with aid of liquid wash and extended time. Belching noted intermittently during PO trials- suspect related to known esophageal concerns (refer to CTA Chest results). Pt unable to clarify reported episodes of vomiting or consistency of reflux/globus sensation.      Based on age and current debility, respiratory/pulmonary status, suspected esophageal concerns- pt is at increased risk of aspiration, therefore recommend aspiration/esophageal precautions (slow rate, small bites, elevated HOB, and alert for PO intake). Maintain upright positioning at least 30 min following a meal. Allow extended time and provided liquid wash for solid clearance from oral cavity. Recommend Dys 2, chopped, and thin liquids. SLP will monitor for safety with current diet. Recommend GI/esophogeal assessment. MD and RN aware of recommendations.   SLP Visit Diagnosis: Dysphagia, unspecified (R13.10) (suspect esophageal impact; oral phase impacted by dentition)     SLP Plan  Continue with current plan of care - focus on education and strategy training      Recommendations for follow up therapy are one component of a multi-disciplinary  discharge planning process, led by the attending physician.  Recommendations may be updated based on patient status, additional functional criteria and insurance authorization.    Recommendations  Diet recommendations: Dysphagia 2 (fine chop);Thin liquid Liquids provided via: Straw;Cup Medication Administration: Whole meds with puree Supervision: Full supervision/cueing for compensatory strategies;Patient able to self feed Compensations: Minimize environmental distractions;Slow rate;Small sips/bites;Follow solids with liquid Postural Changes and/or Swallow Maneuvers: Seated upright 90 degrees;Upright 30-60 min after meal                  Oral care BID;Staff/trained caregiver to provide oral care   Frequent or constant Supervision/Assistance Dysphagia, oropharyngeal phase (R13.12);Dysphagia, pharyngoesophageal phase (R13.14)     Continue with current plan of care   Leilani Punter. Dorthea Gauze, CCC-SLP Speech Language Pathologist [place] [facility]   Asberry Lav  09/18/2023, 9:53 AM

## 2023-09-18 NOTE — Progress Notes (Signed)
 PROGRESS NOTE    Jeremiah Nguyen  ZOX:[id number] DOB: 07-11-1955 DOA: 09/13/2023 PCP: Karron Pagan, MD   Assessment & Plan:   Principal Problem:   Acute on chronic hypoxic respiratory failure (HCC) Active Problems:   Aspiration pneumonia (HCC)   Bullous emphysema (HCC)   Seizures (HCC)   Abnormal CT scan, esophagus  Assessment and Plan: Acute on chronic hypoxic respiratory failure: likely secondary to aspiration pneumonia. Continue on augmentin, bronchodilators & encourage incentive spirometry. Recent prolonged hospitalization for respiratory failure secondary to COPD, influenza & superimposed bacterial pneumonia requiring intubation & ventilation which was d/c to LTAC then SNF. PT/OT recs SNF but pt refuses wants to go back to his group home    Aspiration pneumonia: continue on augmentin, bronchodilators & encourage incentive spirometry    Dysphagia: continue on dysphagia II diet as per speech  GERD: w/ esophagitis. Continue on PPI & H2 blocker. Recent EGD showed distal esophagitis and a hiatal hernia.    Bullous emphysema: completed steroid course. Continue on bronchodilators & encourage incentive spirometry    Seizures: continue on home dose of lamotrigine     DM2: good controlled, HbA1c 6.0. Diet controlled    HTN: continue on metoprolol     Mood disorder: unknown type or severity. Continue on home dose of sertraline , trazodone        DVT prophylaxis: lovenox   Code Status: full  Family Communication: Disposition Plan: likely d/c back to group home and waiting on CM to get back to me in regards to whether or not group home will take the pt back   Level of care: Med-Surg  Status is: Inpatient Remains inpatient appropriate because: waiting on CM to get back to me in regards to whether or not group home will take the pt back     Consultants:    Procedures:   Antimicrobials:    Subjective: Pt c/o fatigue  Objective: Vitals:   09/18/23 0324  09/18/23 0713 09/18/23 0809 09/18/23 0810  BP: 114/64  (!) 111/58   Pulse: 63  70 76  Resp: 18  17   Temp: 98.5 F (36.9 C)  98.2 F (36.8 C)   TempSrc:      SpO2: 92% 97% (!) 89% 90%  Weight:      Height:        Intake/Output Summary (Last 24 hours) at 09/18/2023 0824 Last data filed at 09/18/2023 0323 Gross per 24 hour  Intake 727.18 ml  Output 850 ml  Net -122.82 ml   Filed Weights   09/14/23 1259  Weight: 60.8 kg    Examination:  General exam: Appears calm and comfortable  Respiratory system: Clear to auscultation. Respiratory effort normal. Cardiovascular system: S1 & S2+. No rubs, gallops or clicks.  Gastrointestinal system: Abdomen is nondistended, soft and nontender. Normal bowel sounds heard. Central nervous system: Alert and awake. Moves all extremities  Psychiatry: Judgement and insight appears at baseline. Flat mood and affect     Data Reviewed: I have personally reviewed following labs and imaging studies  CBC: Recent Labs  Lab 09/13/23 0954 09/14/23 0910 09/15/23 0420 09/16/23 0559  WBC 6.3 9.6 7.3 9.6  NEUTROABS 2.5  --  4.1 6.1  HGB 13.3 13.2 12.3* 13.0  HCT 41.1 39.9 36.6* 38.6*  MCV 93.2 91.7 91.0 89.4  PLT 278 259 234 241   Basic Metabolic Panel: Recent Labs  Lab 09/13/23 0836 09/14/23 0910 09/15/23 0420 09/16/23 0559 09/17/23 0448  NA 141 142 141 146* 141  K 4.0 4.2 3.4*  3.5 3.5  CL 106 106 106 105 105  CO2 28 24 28 30 27   GLUCOSE 122* 80 85 88 99  BUN 10 10 8 13 14   CREATININE 1.09 0.96 0.89 1.01 0.91  CALCIUM 9.7 9.4 8.9 9.2 9.4   GFR: Estimated Creatinine Clearance: 66.8 mL/min (by C-G formula based on SCr of 0.91 mg/dL). Liver Function Tests: Recent Labs  Lab 09/13/23 0836  AST 33  ALT 11  ALKPHOS 61  BILITOT 0.9  PROT 6.7  ALBUMIN 3.5   No results for input(s): "LIPASE", "AMYLASE" in the last 168 hours. No results for input(s): "AMMONIA" in the last 168 hours. Coagulation Profile: No results for input(s):  "INR", "PROTIME" in the last 168 hours. Cardiac Enzymes: No results for input(s): "CKTOTAL", "CKMB", "CKMBINDEX", "TROPONINI" in the last 168 hours. BNP (last 3 results) No results for input(s): "PROBNP" in the last 8760 hours. HbA1C: No results for input(s): "HGBA1C" in the last 72 hours. CBG: No results for input(s): "GLUCAP" in the last 168 hours. Lipid Profile: No results for input(s): "CHOL", "HDL", "LDLCALC", "TRIG", "CHOLHDL", "LDLDIRECT" in the last 72 hours. Thyroid Function Tests: No results for input(s): "TSH", "T4TOTAL", "FREET4", "T3FREE", "THYROIDAB" in the last 72 hours. Anemia Panel: No results for input(s): "VITAMINB12", "FOLATE", "FERRITIN", "TIBC", "IRON", "RETICCTPCT" in the last 72 hours. Sepsis Labs: Recent Labs  Lab 09/13/23 0836 09/13/23 1206 09/16/23 0559  PROCALCITON  --   --  <0.10  LATICACIDVEN 2.2* 1.6  --     Recent Results (from the past 240 hours)  Resp panel by RT-PCR (RSV, Flu A&B, Covid) Anterior Nasal Swab     Status: None   Collection Time: 09/13/23  9:13 AM   Specimen: Anterior Nasal Swab  Result Value Ref Range Status   SARS Coronavirus 2 by RT PCR NEGATIVE NEGATIVE Final    Comment: (NOTE) SARS-CoV-2 target nucleic acids are NOT DETECTED.  The SARS-CoV-2 RNA is generally detectable in upper respiratory specimens during the acute phase of infection. The lowest concentration of SARS-CoV-2 viral copies this assay can detect is 138 copies/mL. A negative result does not preclude SARS-Cov-2 infection and should not be used as the sole basis for treatment or other patient management decisions. A negative result may occur with  improper specimen collection/handling, submission of specimen other than nasopharyngeal swab, presence of viral mutation(s) within the areas targeted by this assay, and inadequate number of viral copies(<138 copies/mL). A negative result must be combined with clinical observations, patient history, and  epidemiological information. The expected result is Negative.  Fact Sheet for Patients:  BloggerCourse.com  Fact Sheet for Healthcare Providers:  SeriousBroker.it  This test is no t yet approved or cleared by the [place]  FDA and  has been authorized for detection and/or diagnosis of SARS-CoV-2 by FDA under an Emergency Use Authorization (EUA). This EUA will remain  in effect (meaning this test can be used) for the duration of the COVID-19 declaration under Section 564(b)(1) of the Act, 21 U.S.C.section 360bbb-3(b)(1), unless the authorization is terminated  or revoked sooner.       Influenza A by PCR NEGATIVE NEGATIVE Final   Influenza B by PCR NEGATIVE NEGATIVE Final    Comment: (NOTE) The Xpert Xpress SARS-CoV-2/FLU/RSV plus assay is intended as an aid in the diagnosis of influenza from Nasopharyngeal swab specimens and should not be used as a sole basis for treatment. Nasal washings and aspirates are unacceptable for Xpert Xpress SARS-CoV-2/FLU/RSV testing.  Fact Sheet for Patients: BloggerCourse.com  Fact  Sheet for Healthcare Providers: SeriousBroker.it  This test is not yet approved or cleared by the [place]  FDA and has been authorized for detection and/or diagnosis of SARS-CoV-2 by FDA under an Emergency Use Authorization (EUA). This EUA will remain in effect (meaning this test can be used) for the duration of the COVID-19 declaration under Section 564(b)(1) of the Act, 21 U.S.C. section 360bbb-3(b)(1), unless the authorization is terminated or revoked.     Resp Syncytial Virus by PCR NEGATIVE NEGATIVE Final    Comment: (NOTE) Fact Sheet for Patients: BloggerCourse.com  Fact Sheet for Healthcare Providers: SeriousBroker.it  This test is not yet approved or cleared by the [place]  FDA and has been  authorized for detection and/or diagnosis of SARS-CoV-2 by FDA under an Emergency Use Authorization (EUA). This EUA will remain in effect (meaning this test can be used) for the duration of the COVID-19 declaration under Section 564(b)(1) of the Act, 21 U.S.C. section 360bbb-3(b)(1), unless the authorization is terminated or revoked.  Performed at [facility], [address] [place], [place] [address]   Culture, blood (routine x 2)     Status: None   Collection Time: 09/13/23  9:55 AM   Specimen: BLOOD  Result Value Ref Range Status   Specimen Description BLOOD RIGHT ANTECUBITAL  Final   Special Requests   Final    BOTTLES DRAWN AEROBIC AND ANAEROBIC Blood Culture results may not be optimal due to an inadequate volume of blood received in culture bottles   Culture   Final    NO GROWTH 5 DAYS Performed at [facility], [address] [place], [place] [address]    Report Status 09/18/2023 FINAL  Final  Culture, blood (routine x 2)     Status: None   Collection Time: 09/13/23  9:55 AM   Specimen: BLOOD RIGHT ARM  Result Value Ref Range Status   Specimen Description BLOOD RIGHT ARM  Final   Special Requests   Final    BOTTLES DRAWN AEROBIC AND ANAEROBIC Blood Culture results may not be optimal due to an inadequate volume of blood received in culture bottles   Culture   Final    NO GROWTH 5 DAYS Performed at [facility], [address], [place], [place] [address]    Report Status 09/18/2023 FINAL  Final         Radiology Studies: No results found.      Scheduled Meds:  amoxicillin-clavulanate  1 tablet Oral Q12H   aspirin   81 mg Oral Daily   baclofen   10 mg Oral QID   benzonatate   100 mg Oral TID   chlorproMAZINE   50 mg Oral QID   enoxaparin  (LOVENOX ) injection  40 mg Subcutaneous Q24H   famotidine  20 mg Oral Daily   fluticasone  furoate-vilanterol  1 puff Inhalation Daily   guaiFENesin   600 mg Oral BID   ipratropium-albuterol   3  mL Nebulization BID   lamoTRIgine   150 mg Oral BID   metoprolol  succinate  25 mg Oral Daily   pantoprazole   40 mg Oral BID   polyethylene glycol  17 g Oral BID   senna  2 tablet Oral QHS   sertraline   25 mg Oral Daily   sodium chloride  flush  3 mL Intravenous Q12H   umeclidinium bromide   1 puff Inhalation Daily   Continuous Infusions:   LOS: 5 days      Alphonsus Jeans, MD [facility] Pager [phone]  If 7PM-7AM, please contact night-coverage  [email or web address] 09/18/2023, 8:24 AM

## 2023-09-18 NOTE — Progress Notes (Signed)
 Physical Therapy Treatment Patient Details Name: Jeremiah Nguyen MRN: [id number] DOB: 1955-12-17 Today's Date: 09/18/2023   History of Present Illness Pt is a 68 y/o M admitted on  09/13/23 after presenting with c/o SOB. Pt is being treated for acute on chronic hypoxic respiratory failure, aspiration PNA. PMH: COPD, EtOH use, seizure disorder, recent admission for severe hypoxic respiratory failure requiring mechanical ventilation & d/c from LTAC 1 day prior to presenting to ED    PT Comments  Pt received in Semi-Fowler's position and agreeable to therapy.  Pt stating he did not want to walk, but eventually agreed with maximal encouragement.  Pt perseverated on the use of the nasal cannula, however due to being at 89% SpO2 at rest on 3L of O2, it was best for him to keep the O2.  Pt maintained >89% throughout with use of 3L of supplemental O2 and use of pursed lip breathing.  Pt able to demonstrate improved stability with ambulation today and greater endurance, being able to ambulate the full 160' loop around the nursing station.  Pt then returned to bed and was left with all needs met and call bell within reach.   If plan is discharge home, recommend the following: A little help with walking and/or transfers;A little help with bathing/dressing/bathroom;Assistance with cooking/housework;Assist for transportation;Help with stairs or ramp for entrance   Can travel by private vehicle     Yes  Equipment Recommendations  Rolling walker (2 wheels)    Recommendations for Other Services       Precautions / Restrictions Precautions Precautions: Fall Restrictions Weight Bearing Restrictions Per Provider Order: No     Mobility  Bed Mobility Overal bed mobility: Needs Assistance Bed Mobility: Supine to Sit, Sit to Supine     Supine to sit: Supervision, HOB elevated, Used rails Sit to supine: Supervision, HOB elevated, Used rails        Transfers Overall transfer level: Needs  assistance Equipment used: Rolling walker (2 wheels) Transfers: Sit to/from Stand Sit to Stand: Contact guard assist           General transfer comment: vc's for UE placement and safety    Ambulation/Gait Ambulation/Gait assistance: Contact guard assist Gait Distance (Feet): 160 Feet Assistive device: Rolling walker (2 wheels) Gait Pattern/deviations: Step-through pattern Gait velocity: decreased     General Gait Details: pt with improved steadiness throughout the ambulation attempt around the nursing station.   Stairs             Wheelchair Mobility     Tilt Bed    Modified Rankin (Stroke Patients Only)       Balance Overall balance assessment: Needs assistance Sitting-balance support: No upper extremity supported, Feet supported Sitting balance-Leahy Scale: Good     Standing balance support: Bilateral upper extremity supported, During functional activity, Reliant on assistive device for balance Standing balance-Leahy Scale: Fair                              Hotel manager: No apparent difficulties  Cognition Arousal: Alert Behavior During Therapy: WFL for tasks assessed/performed                             Following commands: Intact      Cueing Cueing Techniques: Verbal cues  Exercises      General Comments        Pertinent Vitals/Pain Pain Assessment  Pain Assessment: No/denies pain    Home Living                          Prior Function            PT Goals (current goals can now be found in the care plan section) Acute Rehab PT Goals Patient Stated Goal: none stated PT Goal Formulation: With patient Time For Goal Achievement: 09/29/23 Potential to Achieve Goals: Good Progress towards PT goals: Progressing toward goals    Frequency    Min 2X/week      PT Plan      Co-evaluation              AM-PAC PT "6 Clicks" Mobility   Outcome Measure  Help  needed turning from your back to your side while in a flat bed without using bedrails?: None Help needed moving from lying on your back to sitting on the side of a flat bed without using bedrails?: A Little Help needed moving to and from a bed to a chair (including a wheelchair)?: A Little Help needed standing up from a chair using your arms (e.g., wheelchair or bedside chair)?: A Little Help needed to walk in hospital room?: A Little Help needed climbing 3-5 steps with a railing? : A Lot 6 Click Score: 18    End of Session Equipment Utilized During Treatment: Gait belt;Oxygen Activity Tolerance: Patient tolerated treatment well Patient left: in bed;with call bell/phone within reach;with bed alarm set Nurse Communication: Mobility status;Precautions PT Visit Diagnosis: Ataxic gait (R26.0);Other abnormalities of gait and mobility (R26.89);Muscle weakness (generalized) (M62.81);Unsteadiness on feet (R26.81);Difficulty in walking, not elsewhere classified (R26.2)     Time: 8295-6213 PT Time Calculation (min) (ACUTE ONLY): 16 min  Charges:    $Therapeutic Activity: 8-22 mins PT General Charges $$ ACUTE PT VISIT: 1 Visit                     Rozanna Corner, PT, DPT Physical Therapist - [facility]  09/18/23, 4:53 PM

## 2023-09-18 NOTE — TOC Progression Note (Signed)
 Transition of Care [facility]) - Progression Note    Patient Details  Name: Jeremiah Nguyen MRN: [id number] Date of Birth: [birth year]  Transition of Care [facility]) CM/SW Contact  Alexandra Ice, RN Phone Number: 09/18/2023, 4:41 PM  Clinical Narrative:    Met with patient at bedside, discussed discharge plan. Patient states he does not have equipment at group home, but he wants to return. He does not want to go to SNF for rehab. Patient will work with PT this afternoon, will see if patient will need any DME.         Expected Discharge Plan and Services                                               Social Determinants of Health (SDOH) Interventions SDOH Screenings   Food Insecurity: No Food Insecurity (09/14/2023)  Housing: Low Risk  (09/14/2023)  Transportation Needs: No Transportation Needs (09/14/2023)  Utilities: Not At Risk (09/14/2023)  Depression (PHQ2-9): Low Risk  (02/25/2019)  Financial Resource Strain: Low Risk  (08/27/2023)   Received from [facility]  Physical Activity: Inactive (06/30/2018)  Social Connections: Socially Isolated (09/14/2023)  Stress: No Stress Concern Present (08/27/2023)   Received from [facility]  Tobacco Use: Medium Risk (09/17/2023)    Readmission Risk Interventions     No data to display

## 2023-09-18 NOTE — Progress Notes (Signed)
 Occupational Therapy Treatment Patient Details Name: Jeremiah Nguyen MRN: [id number] DOB: 03-09-1956 Today's Date: 09/18/2023   History of present illness Pt is a 68 y/o M admitted on  09/13/23 after presenting with c/o SOB. Pt is being treated for acute on chronic hypoxic respiratory failure, aspiration PNA. PMH: COPD, EtOH use, seizure disorder, recent admission for severe hypoxic respiratory failure requiring mechanical ventilation & d/c from LTAC 1 day prior to presenting to ED   OT comments  Jeremiah Nguyen was seen for OT treatment on this date. Upon arrival to room pt seated in chair, repeatedly requesting to return EOB for breakfast. Pt requires CGA + RW for ADL t/f ~15 ft, cues for line/RW mgmt, no LOBs noted. SpO2 84% on 2L [place] with activity, resolved to 94% on 3L [place] with activity. SETUP self-feeding in sitting. Pt making good progress toward goals, will continue to follow POC. Discharge recommendation remains appropriate.       If plan is discharge home, recommend the following:  A lot of help with walking and/or transfers;A lot of help with bathing/dressing/bathroom;Assistance with cooking/housework;Assist for transportation;Help with stairs or ramp for entrance   Equipment Recommendations  None recommended by OT    Recommendations for Other Services      Precautions / Restrictions Precautions Precautions: Fall Restrictions Weight Bearing Restrictions Per Provider Order: No       Mobility Bed Mobility               General bed mobility comments: not tested    Transfers Overall transfer level: Needs assistance Equipment used: Rolling walker (2 wheels) Transfers: Sit to/from Stand Sit to Stand: Contact guard assist                 Balance Overall balance assessment: Needs assistance Sitting-balance support: No upper extremity supported, Feet supported Sitting balance-Leahy Scale: Good     Standing balance support: Bilateral upper extremity supported, During  functional activity, Reliant on assistive device for balance Standing balance-Leahy Scale: Fair                             ADL either performed or assessed with clinical judgement   ADL Overall ADL's : Needs assistance/impaired                                       General ADL Comments: CGA + RW for ADL t/f, cues for line/RW mgmt. SETUP self-feeding in sitting.     Communication Communication Communication: No apparent difficulties   Cognition Arousal: Alert Behavior During Therapy: WFL for tasks assessed/performed Cognition: History of cognitive impairments                               Following commands: Intact        Cueing   Cueing Techniques: Verbal cues             Pertinent Vitals/ Pain       Pain Assessment Pain Assessment: No/denies pain   Frequency  Min 2X/week        Progress Toward Goals  OT Goals(current goals can now be found in the care plan section)  Progress towards OT goals: Progressing toward goals  Acute Rehab OT Goals OT Goal Formulation: With patient Time For Goal Achievement: 09/29/23 Potential to Achieve Goals:  Fair ADL Goals Pt Will Perform Grooming: with modified independence;standing Pt Will Perform Lower Body Dressing: with modified independence;sit to/from stand Pt Will Transfer to Toilet: with modified independence Pt Will Perform Toileting - Clothing Manipulation and hygiene: with modified independence  Plan      Co-evaluation                 AM-PAC OT "6 Clicks" Daily Activity     Outcome Measure   Help from another person eating meals?: None Help from another person taking care of personal grooming?: A Little Help from another person toileting, which includes using toliet, bedpan, or urinal?: A Lot Help from another person bathing (including washing, rinsing, drying)?: A Lot Help from another person to put on and taking off regular upper body clothing?: A  Little Help from another person to put on and taking off regular lower body clothing?: A Lot 6 Click Score: 16    End of Session Equipment Utilized During Treatment: Rolling walker (2 wheels)  OT Visit Diagnosis: Unsteadiness on feet (R26.81);Repeated falls (R29.6);Muscle weakness (generalized) (M62.81)   Activity Tolerance Patient tolerated treatment well   Patient Left in bed;with call bell/phone within reach;with bed alarm set;with nursing/sitter in room   Nurse Communication Mobility status        Time: 7829-5621 OT Time Calculation (min): 11 min  Charges: OT General Charges $OT Visit: 1 Visit OT Treatments $Self Care/Home Management : 8-22 mins  Gordan Latina, M.S. OTR/L  09/18/23, 10:40 AM  ascom [phone]

## 2023-09-18 NOTE — Plan of Care (Signed)

## 2023-09-18 NOTE — Anesthesia Postprocedure Evaluation (Signed)
 Anesthesia Post Note  [name]  Procedure(s) Performed: EGD (ESOPHAGOGASTRODUODENOSCOPY)  Patient location during evaluation: PACU Anesthesia Type: General Level of consciousness: awake and confused Pain management: pain level controlled Vital Signs Assessment: post-procedure vital signs reviewed and stable Respiratory status: spontaneous breathing, nonlabored ventilation, respiratory function stable and patient connected to nasal cannula oxygen Cardiovascular status: blood pressure returned to baseline and stable Postop Assessment: no apparent nausea or vomiting Anesthetic complications: no   No notable events documented.   Last Vitals:  Vitals:   09/18/23 0809 09/18/23 0810  BP: (!) 111/58   Pulse: 70 76  Resp: 17   Temp: 36.8 C   SpO2: (!) 89% 90%    Last Pain:  Vitals:   09/17/23 2015  TempSrc:   PainSc: 0-No pain                 Baltazar Bonier

## 2023-09-18 NOTE — Plan of Care (Signed)
  Problem: Clinical Measurements: Goal: Diagnostic test results will improve Outcome: Progressing   Problem: Activity: Goal: Risk for activity intolerance will decrease Outcome: Progressing   Problem: Nutrition: Goal: Adequate nutrition will be maintained Outcome: Progressing   Problem: Coping: Goal: Level of anxiety will decrease Outcome: Progressing   Problem: Safety: Goal: Ability to remain free from injury will improve Outcome: Progressing

## 2023-09-19 DIAGNOSIS — J9621 Acute and chronic respiratory failure with hypoxia: Secondary | ICD-10-CM | POA: Diagnosis not present

## 2023-09-19 NOTE — TOC Progression Note (Signed)
 Transition of Care [facility]) - Progression Note    Patient Details  Name: DUWAN BLANEY MRN: [id number] Date of Birth: [birth year]  [facility] CM/SW Contact  Alexandra Ice, RN Phone Number: 09/19/2023, 11:02 AM  Clinical Narrative:    Spoke with group home, and she stated she will have to speak with Myrtie Atkinson and call TOC back. Received call back and was told that Nate the manager will have to come do an assessment on patient to see can return to the group home. Provided information to MD. Awaiting call back from Nate the group home manager.         Expected Discharge Plan and Services                                               Social Determinants of Health (SDOH) Interventions SDOH Screenings   Food Insecurity: No Food Insecurity (09/14/2023)  Housing: Low Risk  (09/14/2023)  Transportation Needs: No Transportation Needs (09/14/2023)  Utilities: Not At Risk (09/14/2023)  Depression (PHQ2-9): Low Risk  (02/25/2019)  Financial Resource Strain: Low Risk  (08/27/2023)   Received from [facility]  Physical Activity: Inactive (06/30/2018)  Social Connections: Socially Isolated (09/14/2023)  Stress: No Stress Concern Present (08/27/2023)   Received from [facility]  Tobacco Use: Medium Risk (09/17/2023)    Readmission Risk Interventions     No data to display

## 2023-09-19 NOTE — Progress Notes (Signed)
 SLP Cancellation Note  Patient Details Name: Jeremiah Nguyen MRN: [id number] DOB: 01-19-1956   Cancelled treatment:       Reason Eval/Treat Not Completed: Other (comment) SLP spoke with RN, who reported gross tolerance of PO intake with meals and medication administration. RN reporting continued impulsivity with intake (specifically liquids). Recommendations reinforced for supervision for monitoring impulsivity and esophageal precautions in the setting of known esophageal concerns. Baseline cognitive impairment limits carryover for precautions- pt to require supervision/assistance for PO intake at discharge to reduce risk for aspiration.   Esophageal precautions: slow rate of intake, smaller more frequent meals, follow bites of food with liquids, remain upright for 30-60 minutes after eating, do not eat/drink 2-3 hours before bedtime except water , elevate head of bed at least 30 degrees   Given gross stability on current diet recommendations, no further acute SLP services indicated at this time.   [place] Lilliahna Schubring Clapp, MS, CCC-SLP Speech Language Pathologist Rehab Services; [facility] [phone] (ascom)    [place] J Clapp 09/19/2023, 11:31 AM

## 2023-09-19 NOTE — Progress Notes (Signed)
 PROGRESS NOTE    Jeremiah Nguyen  OVF:[id number] DOB: 1956/05/01 DOA: 09/13/2023 PCP: Karron Pagan, MD   Assessment & Plan:   Principal Problem:   Acute on chronic hypoxic respiratory failure (HCC) Active Problems:   Aspiration pneumonia (HCC)   Bullous emphysema (HCC)   Seizures (HCC)   Abnormal CT scan, esophagus  Assessment and Plan: Acute on chronic hypoxic respiratory failure: likely secondary to aspiration pneumonia. Continue on augmentin , bronchodilators & encourage incentive spirometry. Recent prolonged hospitalization for respiratory failure secondary to COPD, influenza & superimposed bacterial pneumonia requiring intubation & ventilation which was d/c to LTAC then SNF. PT/OT recs SNF but pt refuses wants to go back to his group home    Aspiration pneumonia: continue on augmentin , bronchodilators & encourage incentive spirometry   continue on augmentin , bronchodilators & encourage incentive spirometry    Dysphagia: continue on dysphagia II diet   GERD: w/ esophagitis.Continue on PPI & H2 blocker. Recent EGD showed distal esophagitis and a hiatal hernia.    Bullous emphysema: completed steroid course. Continue on bronchodilators & encourage incentive spirometry    Seizures: continue on home dose of lamotrigine     DM2: good controlled, HbA1c 6.0. Diet controlled    HTN: continue on metoprolol     Mood disorder: unknown type or severity. Continue on home dose of sertraline , trazodone         DVT prophylaxis: lovenox   Code Status: full  Family Communication: Disposition Plan: medically stable. Someone from the pt's group home has to do an assessment before it is determined whether or not the pt can return to group home as per CM. Unknown date and time of when that will happen  Level of care: Med-Surg  Status is: Inpatient Remains inpatient appropriate because: medically stable. Someone from the pt's group home has to an assessment before it is  determined whether or not the pt can return to group home as per CM. Unknown date and time of when that will happen    Consultants:    Procedures:   Antimicrobials:    Subjective: Pt c/o malaise   Objective: Vitals:   09/18/23 2019 09/19/23 0359 09/19/23 0736 09/19/23 0801  BP:  100/60 (!) 106/57   Pulse:  65 78   Resp:  18 18   Temp:   97.9 F (36.6 C)   TempSrc:      SpO2: 93% 93% 94% 94%  Weight:      Height:        Intake/Output Summary (Last 24 hours) at 09/19/2023 0844 Last data filed at 09/19/2023 0749 Gross per 24 hour  Intake 240 ml  Output 2100 ml  Net -1860 ml   Filed Weights   09/14/23 1259  Weight: 60.8 kg    Examination:  General exam: Appears comfortable  Respiratory system: clear breath sounds b/l  Cardiovascular system: S1/S2+. No rubs or clicks   Gastrointestinal system: Abd is soft, NT, ND & hypoactive bowel sounds  Central nervous system: alert & awake. Moves all extremities  Psychiatry: Judgement and insight appears at baseline. Flat mood and affect     Data Reviewed: I have personally reviewed following labs and imaging studies  CBC: Recent Labs  Lab 09/13/23 0954 09/14/23 0910 09/15/23 0420 09/16/23 0559  WBC 6.3 9.6 7.3 9.6  NEUTROABS 2.5  --  4.1 6.1  HGB 13.3 13.2 12.3* 13.0  HCT 41.1 39.9 36.6* 38.6*  MCV 93.2 91.7 91.0 89.4  PLT 278 259 234 241   Basic Metabolic Panel:  Recent Labs  Lab 09/13/23 0836 09/14/23 0910 09/15/23 0420 09/16/23 0559 09/17/23 0448  NA 141 142 141 146* 141  K 4.0 4.2 3.4* 3.5 3.5  CL 106 106 106 105 105  CO2 28 24 28 30 27   GLUCOSE 122* 80 85 88 99  BUN 10 10 8 13 14   CREATININE 1.09 0.96 0.89 1.01 0.91  CALCIUM 9.7 9.4 8.9 9.2 9.4   GFR: Estimated Creatinine Clearance: 66.8 mL/min (by C-G formula based on SCr of 0.91 mg/dL). Liver Function Tests: Recent Labs  Lab 09/13/23 0836  AST 33  ALT 11  ALKPHOS 61  BILITOT 0.9  PROT 6.7  ALBUMIN 3.5   No results for input(s):  "LIPASE", "AMYLASE" in the last 168 hours. No results for input(s): "AMMONIA" in the last 168 hours. Coagulation Profile: No results for input(s): "INR", "PROTIME" in the last 168 hours. Cardiac Enzymes: No results for input(s): "CKTOTAL", "CKMB", "CKMBINDEX", "TROPONINI" in the last 168 hours. BNP (last 3 results) No results for input(s): "PROBNP" in the last 8760 hours. HbA1C: No results for input(s): "HGBA1C" in the last 72 hours. CBG: No results for input(s): "GLUCAP" in the last 168 hours. Lipid Profile: No results for input(s): "CHOL", "HDL", "LDLCALC", "TRIG", "CHOLHDL", "LDLDIRECT" in the last 72 hours. Thyroid Function Tests: No results for input(s): "TSH", "T4TOTAL", "FREET4", "T3FREE", "THYROIDAB" in the last 72 hours. Anemia Panel: No results for input(s): "VITAMINB12", "FOLATE", "FERRITIN", "TIBC", "IRON", "RETICCTPCT" in the last 72 hours. Sepsis Labs: Recent Labs  Lab 09/13/23 0836 09/13/23 1206 09/16/23 0559  PROCALCITON  --   --  <0.10  LATICACIDVEN 2.2* 1.6  --     Recent Results (from the past 240 hours)  Resp panel by RT-PCR (RSV, Flu A&B, Covid) Anterior Nasal Swab     Status: None   Collection Time: 09/13/23  9:13 AM   Specimen: Anterior Nasal Swab  Result Value Ref Range Status   SARS Coronavirus 2 by RT PCR NEGATIVE NEGATIVE Final    Comment: (NOTE) SARS-CoV-2 target nucleic acids are NOT DETECTED.  The SARS-CoV-2 RNA is generally detectable in upper respiratory specimens during the acute phase of infection. The lowest concentration of SARS-CoV-2 viral copies this assay can detect is 138 copies/mL. A negative result does not preclude SARS-Cov-2 infection and should not be used as the sole basis for treatment or other patient management decisions. A negative result may occur with  improper specimen collection/handling, submission of specimen other than nasopharyngeal swab, presence of viral mutation(s) within the areas targeted by this assay, and  inadequate number of viral copies(<138 copies/mL). A negative result must be combined with clinical observations, patient history, and epidemiological information. The expected result is Negative.  Fact Sheet for Patients:  BloggerCourse.com  Fact Sheet for Healthcare Providers:  SeriousBroker.it  This test is no t yet approved or cleared by the [place]  FDA and  has been authorized for detection and/or diagnosis of SARS-CoV-2 by FDA under an Emergency Use Authorization (EUA). This EUA will remain  in effect (meaning this test can be used) for the duration of the COVID-19 declaration under Section 564(b)(1) of the Act, 21 U.S.C.section 360bbb-3(b)(1), unless the authorization is terminated  or revoked sooner.       Influenza A by PCR NEGATIVE NEGATIVE Final   Influenza B by PCR NEGATIVE NEGATIVE Final    Comment: (NOTE) The Xpert Xpress SARS-CoV-2/FLU/RSV plus assay is intended as an aid in the diagnosis of influenza from Nasopharyngeal swab specimens and should not be  used as a sole basis for treatment. Nasal washings and aspirates are unacceptable for Xpert Xpress SARS-CoV-2/FLU/RSV testing.  Fact Sheet for Patients: BloggerCourse.com  Fact Sheet for Healthcare Providers: SeriousBroker.it  This test is not yet approved or cleared by the [place]  FDA and has been authorized for detection and/or diagnosis of SARS-CoV-2 by FDA under an Emergency Use Authorization (EUA). This EUA will remain in effect (meaning this test can be used) for the duration of the COVID-19 declaration under Section 564(b)(1) of the Act, 21 U.S.C. section 360bbb-3(b)(1), unless the authorization is terminated or revoked.     Resp Syncytial Virus by PCR NEGATIVE NEGATIVE Final    Comment: (NOTE) Fact Sheet for Patients: BloggerCourse.com  Fact Sheet for Healthcare  Providers: SeriousBroker.it  This test is not yet approved or cleared by the [place]  FDA and has been authorized for detection and/or diagnosis of SARS-CoV-2 by FDA under an Emergency Use Authorization (EUA). This EUA will remain in effect (meaning this test can be used) for the duration of the COVID-19 declaration under Section 564(b)(1) of the Act, 21 U.S.C. section 360bbb-3(b)(1), unless the authorization is terminated or revoked.  Performed at [facility], [address] [place], [place] [address]   Culture, blood (routine x 2)     Status: None   Collection Time: 09/13/23  9:55 AM   Specimen: BLOOD  Result Value Ref Range Status   Specimen Description BLOOD RIGHT ANTECUBITAL  Final   Special Requests   Final    BOTTLES DRAWN AEROBIC AND ANAEROBIC Blood Culture results may not be optimal due to an inadequate volume of blood received in culture bottles   Culture   Final    NO GROWTH 5 DAYS Performed at [facility], [address] [place], [place] [address]    Report Status 09/18/2023 FINAL  Final  Culture, blood (routine x 2)     Status: None   Collection Time: 09/13/23  9:55 AM   Specimen: BLOOD RIGHT ARM  Result Value Ref Range Status   Specimen Description BLOOD RIGHT ARM  Final   Special Requests   Final    BOTTLES DRAWN AEROBIC AND ANAEROBIC Blood Culture results may not be optimal due to an inadequate volume of blood received in culture bottles   Culture   Final    NO GROWTH 5 DAYS Performed at [facility], [address], [place], [place] [address]    Report Status 09/18/2023 FINAL  Final         Radiology Studies: No results found.      Scheduled Meds:  amoxicillin -clavulanate  1 tablet Oral Q12H   aspirin   81 mg Oral Daily   baclofen   10 mg Oral QID   benzonatate   100 mg Oral TID   chlorproMAZINE   50 mg Oral QID   enoxaparin  (LOVENOX ) injection  40 mg Subcutaneous Q24H   famotidine    20 mg Oral Daily   fluticasone  furoate-vilanterol  1 puff Inhalation Daily   guaiFENesin   600 mg Oral BID   ipratropium-albuterol   3 mL Nebulization BID   lamoTRIgine   150 mg Oral BID   metoprolol  succinate  25 mg Oral Daily   pantoprazole   40 mg Oral BID   polyethylene glycol  17 g Oral BID   senna  2 tablet Oral QHS   sertraline   25 mg Oral Daily   sodium chloride  flush  3 mL Intravenous Q12H   umeclidinium bromide   1 puff Inhalation Daily   Continuous  Infusions:   LOS: 6 days      Alphonsus Jeans, MD [facility] Pager [phone]  If 7PM-7AM, please contact night-coverage [email or web address] 09/19/2023, 8:44 AM

## 2023-09-19 NOTE — Progress Notes (Signed)
 Physical Therapy Treatment Patient Details Name: Jeremiah Nguyen MRN: [id number] DOB: 02-20-1956 Today's Date: 09/19/2023   History of Present Illness Pt is a 68 y/o M admitted on  09/13/23 after presenting with c/o SOB. Pt is being treated for acute on chronic hypoxic respiratory failure, aspiration PNA. PMH: COPD, EtOH use, seizure disorder, recent admission for severe hypoxic respiratory failure requiring mechanical ventilation & d/c from LTAC 1 day prior to presenting to ED    PT Comments  Pt resting in bed upon PT arrival; pt agreeable to therapy and walking.  During session pt was SBA with bed mobility; CGA with transfer from bed; and CGA to ambulate 190 feet with RW use.  Pt's SpO2 sats 88% or greater on 3 L O2 via nasal cannula during sessions activities.  Impaired gait mechanics noted but no loss of balance noted ambulating with RW use.  Will continue to focus on strengthening, balance, and progressive functional mobility during hospitalization.    If plan is discharge home, recommend the following: A little help with walking and/or transfers;A little help with bathing/dressing/bathroom;Assistance with cooking/housework;Assist for transportation;Help with stairs or ramp for entrance   Can travel by private vehicle     Yes  Equipment Recommendations  Rolling walker (2 wheels)    Recommendations for Other Services       Precautions / Restrictions Precautions Precautions: Fall Restrictions Weight Bearing Restrictions Per Provider Order: No     Mobility  Bed Mobility Overal bed mobility: Needs Assistance Bed Mobility: Supine to Sit, Sit to Supine     Supine to sit: Supervision, HOB elevated, Used rails Sit to supine: Supervision, HOB elevated, Used rails   General bed mobility comments: mild increased effort to perform on own    Transfers Overall transfer level: Needs assistance Equipment used: Rolling walker (2 wheels) Transfers: Sit to/from Stand Sit to Stand: Contact  guard assist           General transfer comment: initial vc's for UE placement    Ambulation/Gait Ambulation/Gait assistance: Contact guard assist Gait Distance (Feet): 190 Feet Assistive device: Rolling walker (2 wheels) Gait Pattern/deviations: Step-through pattern Gait velocity: decreased     General Gait Details: pt demonstrating more narrow BOS towards end of ambulation but then corrected it on own; mild unsteadiness at times but no loss of balance noted   Stairs             Wheelchair Mobility     Tilt Bed    Modified Rankin (Stroke Patients Only)       Balance Overall balance assessment: Needs assistance Sitting-balance support: No upper extremity supported, Feet supported Sitting balance-Leahy Scale: Good Sitting balance - Comments: steady reaching within BOS   Standing balance support: Bilateral upper extremity supported, During functional activity, Reliant on assistive device for balance Standing balance-Leahy Scale: Good Standing balance comment: no loss of balance noted with ambulation using RW                            Communication Communication Communication: No apparent difficulties  Cognition Arousal: Alert Behavior During Therapy: WFL for tasks assessed/performed                           PT - Cognition Comments: A&O to person, place, and general situation.  Generalized confusion noted. Following commands: Intact      Cueing Cueing Techniques: Verbal cues  Exercises  General Comments  Pt agreeable to PT session.      Pertinent Vitals/Pain Pain Assessment Pain Assessment: No/denies pain    Home Living                          Prior Function            PT Goals (current goals can now be found in the care plan section) Acute Rehab PT Goals Patient Stated Goal: none stated PT Goal Formulation: With patient Time For Goal Achievement: 09/29/23 Potential to Achieve Goals: Good Progress  towards PT goals: Progressing toward goals    Frequency    Min 2X/week      PT Plan      Co-evaluation              AM-PAC PT "6 Clicks" Mobility   Outcome Measure  Help needed turning from your back to your side while in a flat bed without using bedrails?: None Help needed moving from lying on your back to sitting on the side of a flat bed without using bedrails?: A Little Help needed moving to and from a bed to a chair (including a wheelchair)?: A Little Help needed standing up from a chair using your arms (e.g., wheelchair or bedside chair)?: A Little Help needed to walk in hospital room?: A Little Help needed climbing 3-5 steps with a railing? : A Little 6 Click Score: 19    End of Session Equipment Utilized During Treatment: Gait belt;Oxygen Activity Tolerance: Patient tolerated treatment well Patient left: in bed;with call bell/phone within reach;with bed alarm set Nurse Communication: Mobility status;Precautions PT Visit Diagnosis: Ataxic gait (R26.0);Other abnormalities of gait and mobility (R26.89);Muscle weakness (generalized) (M62.81);Unsteadiness on feet (R26.81);Difficulty in walking, not elsewhere classified (R26.2)     Time: 1610-9604 PT Time Calculation (min) (ACUTE ONLY): 14 min  Charges:    $Therapeutic Activity: 8-22 mins PT General Charges $$ ACUTE PT VISIT: 1 Visit                     Amador Junes, PT 09/19/23, 3:56 PM

## 2023-09-20 DIAGNOSIS — J9621 Acute and chronic respiratory failure with hypoxia: Secondary | ICD-10-CM | POA: Diagnosis not present

## 2023-09-20 MED ORDER — ALPRAZOLAM 0.25 MG PO TABS
0.2500 mg | ORAL_TABLET | Freq: Once | ORAL | Status: AC
  Administered 2023-09-20: 0.25 mg via ORAL
  Filled 2023-09-20: qty 1

## 2023-09-20 NOTE — Progress Notes (Signed)
 Physical Therapy Treatment Patient Details Name: Jeremiah Nguyen MRN: [id number] DOB: February 18, 1956 Today's Date: 09/20/2023   History of Present Illness Pt is a 68 y/o M admitted on  09/13/23 after presenting with c/o SOB. Pt is being treated for acute on chronic hypoxic respiratory failure, aspiration PNA. PMH: COPD, EtOH use, seizure disorder, recent admission for severe hypoxic respiratory failure requiring mechanical ventilation & d/c from LTAC 1 day prior to presenting to ED.    PT Comments  Pt resting in bed upon PT arrival; pt appearing to perseverate on needing to call his sister regarding discharge (therapist attempted to assist pt but call did not go through; notified pt's nurse who reported they would assist pt).  During session pt was SBA with bed mobility; CGA with transfer from bed using RW; and CGA to ambulate around nursing station with RW use.  Pt's SpO2 sats 91% or greater on 4 L O2 via nasal cannula during sessions activities (93% at rest end of session).  Will continue to focus on strengthening, balance, and progressive functional mobility during hospitalization.   If plan is discharge home, recommend the following: A little help with walking and/or transfers;A little help with bathing/dressing/bathroom;Assistance with cooking/housework;Assist for transportation;Help with stairs or ramp for entrance   Can travel by private vehicle     Yes  Equipment Recommendations  Rolling walker (2 wheels)    Recommendations for Other Services       Precautions / Restrictions Precautions Precautions: Fall Restrictions Weight Bearing Restrictions Per Provider Order: No     Mobility  Bed Mobility Overal bed mobility: Needs Assistance Bed Mobility: Supine to Sit, Sit to Supine     Supine to sit: Supervision, HOB elevated, Used rails Sit to supine: Supervision, HOB elevated, Used rails   General bed mobility comments: mild increased effort to perform on own    Transfers Overall  transfer level: Needs assistance Equipment used: Rolling walker (2 wheels) Transfers: Sit to/from Stand Sit to Stand: Contact guard assist           General transfer comment: initial vc's for UE placement    Ambulation/Gait Ambulation/Gait assistance: Contact guard assist Gait Distance (Feet): 190 Feet Assistive device: Rolling walker (2 wheels) Gait Pattern/deviations: Step-through pattern Gait velocity: decreased     General Gait Details: mild decreased stance time R LE; mild unsteadiness at times but no loss of balance noted   Stairs             Wheelchair Mobility     Tilt Bed    Modified Rankin (Stroke Patients Only)       Balance Overall balance assessment: Needs assistance Sitting-balance support: No upper extremity supported, Feet supported Sitting balance-Leahy Scale: Good Sitting balance - Comments: steady reaching within BOS   Standing balance support: Bilateral upper extremity supported, During functional activity, Reliant on assistive device for balance Standing balance-Leahy Scale: Good Standing balance comment: no loss of balance noted with ambulation using RW                            Communication Communication Communication: No apparent difficulties  Cognition Arousal: Alert Behavior During Therapy: Impulsive   PT - Cognitive impairments: No family/caregiver present to determine baseline                       PT - Cognition Comments: A&O to person, place, and general situation.  Generalized confusion noted.  Pt  perseverating on calling his sister for discharge (nurse notified and reported she would assist pt). Following commands: Intact      Cueing Cueing Techniques: Verbal cues  Exercises      General Comments        Pertinent Vitals/Pain Pain Assessment Pain Assessment: No/denies pain HR 93 bpm post ambulation    Home Living                          Prior Function            PT  Goals (current goals can now be found in the care plan section) Acute Rehab PT Goals Patient Stated Goal: none stated PT Goal Formulation: With patient Time For Goal Achievement: 09/29/23 Potential to Achieve Goals: Good Progress towards PT goals: Progressing toward goals    Frequency    Min 2X/week      PT Plan      Co-evaluation              AM-PAC PT "6 Clicks" Mobility   Outcome Measure  Help needed turning from your back to your side while in a flat bed without using bedrails?: None Help needed moving from lying on your back to sitting on the side of a flat bed without using bedrails?: A Little Help needed moving to and from a bed to a chair (including a wheelchair)?: A Little Help needed standing up from a chair using your arms (e.g., wheelchair or bedside chair)?: A Little Help needed to walk in hospital room?: A Little Help needed climbing 3-5 steps with a railing? : A Little 6 Click Score: 19    End of Session Equipment Utilized During Treatment: Gait belt;Oxygen Activity Tolerance: Patient tolerated treatment well Patient left: in bed;with call bell/phone within reach;with bed alarm set;with nursing/sitter in room Nurse Communication: Mobility status;Precautions;Other (comment) (Pt requesting assist with calling his sister) PT Visit Diagnosis: Ataxic gait (R26.0);Other abnormalities of gait and mobility (R26.89);Muscle weakness (generalized) (M62.81);Unsteadiness on feet (R26.81);Difficulty in walking, not elsewhere classified (R26.2)     Time: 9147-8295 PT Time Calculation (min) (ACUTE ONLY): 15 min  Charges:    $Gait Training: 8-22 mins PT General Charges $$ ACUTE PT VISIT: 1 Visit                     Amador Junes, PT 09/20/23, 4:12 PM

## 2023-09-20 NOTE — TOC Progression Note (Signed)
 Transition of Care [facility]) - Progression Note    Patient Details  Name: Jeremiah Nguyen MRN: [id number] Date of Birth: [birth year]  [facility] CM/SW Contact  Alexandra Ice, RN Phone Number: 09/20/2023, 2:28 PM  Clinical Narrative:    Contacted group home, spoke with Saunders Curio. Stated he was not aware Nate was to come see patient, as he just returned from vacation. He will contact Nate and follow-up. Provided TOC contact number.         Expected Discharge Plan and Services                                               Social Determinants of Health (SDOH) Interventions SDOH Screenings   Food Insecurity: No Food Insecurity (09/14/2023)  Housing: Low Risk  (09/14/2023)  Transportation Needs: No Transportation Needs (09/14/2023)  Utilities: Not At Risk (09/14/2023)  Depression (PHQ2-9): Low Risk  (02/25/2019)  Financial Resource Strain: Low Risk  (08/27/2023)   Received from [facility]  Physical Activity: Inactive (06/30/2018)  Social Connections: Socially Isolated (09/14/2023)  Stress: No Stress Concern Present (08/27/2023)   Received from [facility]  Tobacco Use: Medium Risk (09/17/2023)    Readmission Risk Interventions     No data to display

## 2023-09-20 NOTE — Progress Notes (Signed)
 PROGRESS NOTE    Jeremiah Nguyen  VHQ:[id number] DOB: 1955/07/24 DOA: 09/13/2023 PCP: Karron Pagan, MD   Assessment & Plan:   Principal Problem:   Acute on chronic hypoxic respiratory failure (HCC) Active Problems:   Aspiration pneumonia (HCC)   Bullous emphysema (HCC)   Seizures (HCC)   Abnormal CT scan, esophagus  Assessment and Plan: Acute on chronic hypoxic respiratory failure: likely secondary to aspiration pneumonia. Completed abx course. Continue on bronchodilators & encourage incentive spirometry. Recent prolonged hospitalization for respiratory failure secondary to COPD, influenza & superimposed bacterial pneumonia requiring intubation & ventilation which was d/c to LTAC then SNF. PT/OT recs SNF but pt refuses wants to go back to his group home    Aspiration pneumonia: completed abx course. Continue on bronchodilators & encourage incentive spirometry    Dysphagia: continue on dysphagia II diet   GERD: w/ esophagitis. Continue on H2 blocker & PPI. Recent EGD showed distal esophagitis and a hiatal hernia.    Bullous emphysema: completed steroid course. Continue on bronchodilators. Encourage incentive spirometry    Seizures: continue on home dose of lamotrigine     DM2: good controlled, HbA1c 6.0. Diet controlled    HTN: continue on metoprolol     Mood disorder: unknown type or severity. Continue on home dose of sertraline , trazodone         DVT prophylaxis: lovenox   Code Status: full  Family Communication: Disposition Plan: medically stable. Someone from the pt's group home has to do an assessment before it is determined whether or not the pt can return to group home as per CM. Still unknown date and time when this will happen   Level of care: Med-Surg  Status is: Inpatient Remains inpatient appropriate because: medically stable. Someone from the pt's group home has to an assessment before it is determined whether or not the pt can return to group home  as per CM. Still unknown date & time when this will happen    Consultants:    Procedures:   Antimicrobials:    Subjective: Pt c/o fatigue    Objective: Vitals:   09/19/23 1616 09/19/23 1918 09/19/23 2006 09/20/23 0758  BP: 115/62 125/64  115/69  Pulse: 79 68  85  Resp: 16 17  17   Temp: 98.2 F (36.8 C) 98.9 F (37.2 C)  98.3 F (36.8 C)  TempSrc:    Oral  SpO2: 98% 93% 93% 98%  Weight:      Height:        Intake/Output Summary (Last 24 hours) at 09/20/2023 0817 Last data filed at 09/20/2023 0700 Gross per 24 hour  Intake --  Output 1850 ml  Net -1850 ml   Filed Weights   09/14/23 1259  Weight: 60.8 kg    Examination:  General exam: Appears calm & comfortable  Respiratory system: clear breath sounds b/l  Cardiovascular system: S1 & S2+. No rubs or clicks  Gastrointestinal system: abd is soft, NT, ND & hypoactive bowel sounds   Central nervous system: alert & awake. Moves all extremities  Psychiatry: Judgement and insight appears at baseline. Flat mood and affect    Data Reviewed: I have personally reviewed following labs and imaging studies  CBC: Recent Labs  Lab 09/13/23 0954 09/14/23 0910 09/15/23 0420 09/16/23 0559  WBC 6.3 9.6 7.3 9.6  NEUTROABS 2.5  --  4.1 6.1  HGB 13.3 13.2 12.3* 13.0  HCT 41.1 39.9 36.6* 38.6*  MCV 93.2 91.7 91.0 89.4  PLT 278 259 234 241  Basic Metabolic Panel: Recent Labs  Lab 09/13/23 0836 09/14/23 0910 09/15/23 0420 09/16/23 0559 09/17/23 0448  NA 141 142 141 146* 141  K 4.0 4.2 3.4* 3.5 3.5  CL 106 106 106 105 105  CO2 28 24 28 30 27   GLUCOSE 122* 80 85 88 99  BUN 10 10 8 13 14   CREATININE 1.09 0.96 0.89 1.01 0.91  CALCIUM 9.7 9.4 8.9 9.2 9.4   GFR: Estimated Creatinine Clearance: 66.8 mL/min (by C-G formula based on SCr of 0.91 mg/dL). Liver Function Tests: Recent Labs  Lab 09/13/23 0836  AST 33  ALT 11  ALKPHOS 61  BILITOT 0.9  PROT 6.7  ALBUMIN 3.5   No results for input(s): "LIPASE",  "AMYLASE" in the last 168 hours. No results for input(s): "AMMONIA" in the last 168 hours. Coagulation Profile: No results for input(s): "INR", "PROTIME" in the last 168 hours. Cardiac Enzymes: No results for input(s): "CKTOTAL", "CKMB", "CKMBINDEX", "TROPONINI" in the last 168 hours. BNP (last 3 results) No results for input(s): "PROBNP" in the last 8760 hours. HbA1C: No results for input(s): "HGBA1C" in the last 72 hours. CBG: No results for input(s): "GLUCAP" in the last 168 hours. Lipid Profile: No results for input(s): "CHOL", "HDL", "LDLCALC", "TRIG", "CHOLHDL", "LDLDIRECT" in the last 72 hours. Thyroid Function Tests: No results for input(s): "TSH", "T4TOTAL", "FREET4", "T3FREE", "THYROIDAB" in the last 72 hours. Anemia Panel: No results for input(s): "VITAMINB12", "FOLATE", "FERRITIN", "TIBC", "IRON", "RETICCTPCT" in the last 72 hours. Sepsis Labs: Recent Labs  Lab 09/13/23 0836 09/13/23 1206 09/16/23 0559  PROCALCITON  --   --  <0.10  LATICACIDVEN 2.2* 1.6  --     Recent Results (from the past 240 hours)  Resp panel by RT-PCR (RSV, Flu A&B, Covid) Anterior Nasal Swab     Status: None   Collection Time: 09/13/23  9:13 AM   Specimen: Anterior Nasal Swab  Result Value Ref Range Status   SARS Coronavirus 2 by RT PCR NEGATIVE NEGATIVE Final    Comment: (NOTE) SARS-CoV-2 target nucleic acids are NOT DETECTED.  The SARS-CoV-2 RNA is generally detectable in upper respiratory specimens during the acute phase of infection. The lowest concentration of SARS-CoV-2 viral copies this assay can detect is 138 copies/mL. A negative result does not preclude SARS-Cov-2 infection and should not be used as the sole basis for treatment or other patient management decisions. A negative result may occur with  improper specimen collection/handling, submission of specimen other than nasopharyngeal swab, presence of viral mutation(s) within the areas targeted by this assay, and inadequate  number of viral copies(<138 copies/mL). A negative result must be combined with clinical observations, patient history, and epidemiological information. The expected result is Negative.  Fact Sheet for Patients:  BloggerCourse.com  Fact Sheet for Healthcare Providers:  SeriousBroker.it  This test is no t yet approved or cleared by the [place]  FDA and  has been authorized for detection and/or diagnosis of SARS-CoV-2 by FDA under an Emergency Use Authorization (EUA). This EUA will remain  in effect (meaning this test can be used) for the duration of the COVID-19 declaration under Section 564(b)(1) of the Act, 21 U.S.C.section 360bbb-3(b)(1), unless the authorization is terminated  or revoked sooner.       Influenza A by PCR NEGATIVE NEGATIVE Final   Influenza B by PCR NEGATIVE NEGATIVE Final    Comment: (NOTE) The Xpert Xpress SARS-CoV-2/FLU/RSV plus assay is intended as an aid in the diagnosis of influenza from Nasopharyngeal swab specimens and  should not be used as a sole basis for treatment. Nasal washings and aspirates are unacceptable for Xpert Xpress SARS-CoV-2/FLU/RSV testing.  Fact Sheet for Patients: BloggerCourse.com  Fact Sheet for Healthcare Providers: SeriousBroker.it  This test is not yet approved or cleared by the [place]  FDA and has been authorized for detection and/or diagnosis of SARS-CoV-2 by FDA under an Emergency Use Authorization (EUA). This EUA will remain in effect (meaning this test can be used) for the duration of the COVID-19 declaration under Section 564(b)(1) of the Act, 21 U.S.C. section 360bbb-3(b)(1), unless the authorization is terminated or revoked.     Resp Syncytial Virus by PCR NEGATIVE NEGATIVE Final    Comment: (NOTE) Fact Sheet for Patients: BloggerCourse.com  Fact Sheet for Healthcare  Providers: SeriousBroker.it  This test is not yet approved or cleared by the [place]  FDA and has been authorized for detection and/or diagnosis of SARS-CoV-2 by FDA under an Emergency Use Authorization (EUA). This EUA will remain in effect (meaning this test can be used) for the duration of the COVID-19 declaration under Section 564(b)(1) of the Act, 21 U.S.C. section 360bbb-3(b)(1), unless the authorization is terminated or revoked.  Performed at [facility], [address] [place], [place] [address]   Culture, blood (routine x 2)     Status: None   Collection Time: 09/13/23  9:55 AM   Specimen: BLOOD  Result Value Ref Range Status   Specimen Description BLOOD RIGHT ANTECUBITAL  Final   Special Requests   Final    BOTTLES DRAWN AEROBIC AND ANAEROBIC Blood Culture results may not be optimal due to an inadequate volume of blood received in culture bottles   Culture   Final    NO GROWTH 5 DAYS Performed at [facility], [address] [place], [place] [address]    Report Status 09/18/2023 FINAL  Final  Culture, blood (routine x 2)     Status: None   Collection Time: 09/13/23  9:55 AM   Specimen: BLOOD RIGHT ARM  Result Value Ref Range Status   Specimen Description BLOOD RIGHT ARM  Final   Special Requests   Final    BOTTLES DRAWN AEROBIC AND ANAEROBIC Blood Culture results may not be optimal due to an inadequate volume of blood received in culture bottles   Culture   Final    NO GROWTH 5 DAYS Performed at [facility], [address], [place], [place] [address]    Report Status 09/18/2023 FINAL  Final         Radiology Studies: No results found.      Scheduled Meds:  aspirin   81 mg Oral Daily   baclofen   10 mg Oral QID   benzonatate   100 mg Oral TID   chlorproMAZINE   50 mg Oral QID   enoxaparin  (LOVENOX ) injection  40 mg Subcutaneous Q24H   famotidine   20 mg Oral Daily   fluticasone   furoate-vilanterol  1 puff Inhalation Daily   guaiFENesin   600 mg Oral BID   ipratropium-albuterol   3 mL Nebulization BID   lamoTRIgine   150 mg Oral BID   metoprolol  succinate  25 mg Oral Daily   pantoprazole   40 mg Oral BID   polyethylene glycol  17 g Oral BID   senna  2 tablet Oral QHS   sertraline   25 mg Oral Daily   sodium chloride  flush  3 mL Intravenous Q12H   umeclidinium bromide   1 puff Inhalation Daily   Continuous Infusions:   LOS: 7  days      Alphonsus Jeans, MD [facility] Pager [phone]  If 7PM-7AM, please contact night-coverage [email or web address] 09/20/2023, 8:17 AM

## 2023-09-21 DIAGNOSIS — J9621 Acute and chronic respiratory failure with hypoxia: Secondary | ICD-10-CM | POA: Diagnosis not present

## 2023-09-21 NOTE — Plan of Care (Signed)
  Problem: Education: Goal: Knowledge of General Education information will improve Description: Including pain rating scale, medication(s)/side effects and non-pharmacologic comfort measures Outcome: Progressing   Problem: Clinical Measurements: Goal: Ability to maintain clinical measurements within normal limits will improve Outcome: Progressing   Problem: Elimination: Goal: Will not experience complications related to bowel motility Outcome: Progressing Goal: Will not experience complications related to urinary retention Outcome: Progressing   Problem: Pain Managment: Goal: General experience of comfort will improve and/or be controlled Outcome: Progressing   Problem: Safety: Goal: Ability to remain free from injury will improve Outcome: Progressing   Problem: Skin Integrity: Goal: Risk for impaired skin integrity will decrease Outcome: Progressing

## 2023-09-21 NOTE — Progress Notes (Signed)
 PROGRESS NOTE    Jeremiah Nguyen  BMW:[id number] DOB: 04-06-1956 DOA: 09/13/2023 PCP: Karron Pagan, MD   Assessment & Plan:   Principal Problem:   Acute on chronic hypoxic respiratory failure (HCC) Active Problems:   Aspiration pneumonia (HCC)   Bullous emphysema (HCC)   Seizures (HCC)   Abnormal CT scan, esophagus  Assessment and Plan: Acute on chronic hypoxic respiratory failure: likely secondary to aspiration pneumonia. Completed abx course. Continue on bronchodilators & encourage incentive spirometry. Recent prolonged hospitalization for respiratory failure secondary to COPD, influenza & superimposed bacterial pneumonia requiring intubation & ventilation which was d/c to LTAC then SNF. PT/OT recs SNF but pt refuses wants to go back to his group home    Aspiration pneumonia: completed abx course. Continue on bronchodilators & encourage incentive spirometry    Dysphagia: continue on dysphagia II diet   GERD: w/ esophagitis. Continue on PPI and H2 blocker.. Recent EGD showed distal esophagitis and a hiatal hernia.    Bullous emphysema: completed steroid course. Continue on bronchodilators and encourage incentive spirometry    Seizures: continue on home dose of lamotrigine     DM2: good controlled, HbA1c 6.0. Diet controlled    HTN: continue on metoprolol     Mood disorder: unknown type or severity. Continue on home dose of trazodone , sertraline          DVT prophylaxis: lovenox   Code Status: full  Family Communication: Disposition Plan: medically stable. Someone from the pt's group home has to do an assessment before it is determined whether or not the pt can return to group home as per CM. Still unknown date and time when this will happen   Level of care: Med-Surg  Status is: Inpatient Remains inpatient appropriate because: medically stable. Someone from the pt's group home has to do an assessment before it is determined whether or not the pt can return to  group home as per CM. Still unknown date & time when this will happen    Consultants:    Procedures:   Antimicrobials:    Subjective: Pt c/o malaise   Objective: Vitals:   09/20/23 1954 09/21/23 0507 09/21/23 0744 09/21/23 0755  BP: (!) 98/55 96/60  (!) 108/58  Pulse: 79 70  78  Resp: 16 16  17   Temp: 98.7 F (37.1 C) 97.7 F (36.5 C)  98.6 F (37 C)  TempSrc: Oral Axillary    SpO2: 94% 97% 91% 95%  Weight:      Height:        Intake/Output Summary (Last 24 hours) at 09/21/2023 0805 Last data filed at 09/21/2023 0500 Gross per 24 hour  Intake 240 ml  Output 825 ml  Net -585 ml   Filed Weights   09/14/23 1259  Weight: 60.8 kg    Examination:  General exam: appears comfortable  Respiratory system: clear breath sounds b/l  Cardiovascular system: S1/S2+. No rubs or clicks   Gastrointestinal system: abd is soft, NT, ND & hypoactive bowel sounds    Central nervous system: alert & awake. Moves all extremities  Psychiatry: Judgement and insight appears at baseline. Flat mood and affect     Data Reviewed: I have personally reviewed following labs and imaging studies  CBC: Recent Labs  Lab 09/14/23 0910 09/15/23 0420 09/16/23 0559  WBC 9.6 7.3 9.6  NEUTROABS  --  4.1 6.1  HGB 13.2 12.3* 13.0  HCT 39.9 36.6* 38.6*  MCV 91.7 91.0 89.4  PLT 259 234 241   Basic Metabolic Panel: Recent  Labs  Lab 09/14/23 0910 09/15/23 0420 09/16/23 0559 09/17/23 0448  NA 142 141 146* 141  K 4.2 3.4* 3.5 3.5  CL 106 106 105 105  CO2 24 28 30 27   GLUCOSE 80 85 88 99  BUN 10 8 13 14   CREATININE 0.96 0.89 1.01 0.91  CALCIUM 9.4 8.9 9.2 9.4   GFR: Estimated Creatinine Clearance: 66.8 mL/min (by C-G formula based on SCr of 0.91 mg/dL). Liver Function Tests: No results for input(s): "AST", "ALT", "ALKPHOS", "BILITOT", "PROT", "ALBUMIN" in the last 168 hours.  No results for input(s): "LIPASE", "AMYLASE" in the last 168 hours. No results for input(s): "AMMONIA" in the  last 168 hours. Coagulation Profile: No results for input(s): "INR", "PROTIME" in the last 168 hours. Cardiac Enzymes: No results for input(s): "CKTOTAL", "CKMB", "CKMBINDEX", "TROPONINI" in the last 168 hours. BNP (last 3 results) No results for input(s): "PROBNP" in the last 8760 hours. HbA1C: No results for input(s): "HGBA1C" in the last 72 hours. CBG: No results for input(s): "GLUCAP" in the last 168 hours. Lipid Profile: No results for input(s): "CHOL", "HDL", "LDLCALC", "TRIG", "CHOLHDL", "LDLDIRECT" in the last 72 hours. Thyroid Function Tests: No results for input(s): "TSH", "T4TOTAL", "FREET4", "T3FREE", "THYROIDAB" in the last 72 hours. Anemia Panel: No results for input(s): "VITAMINB12", "FOLATE", "FERRITIN", "TIBC", "IRON", "RETICCTPCT" in the last 72 hours. Sepsis Labs: Recent Labs  Lab 09/16/23 0559  PROCALCITON <0.10    Recent Results (from the past 240 hours)  Resp panel by RT-PCR (RSV, Flu A&B, Covid) Anterior Nasal Swab     Status: None   Collection Time: 09/13/23  9:13 AM   Specimen: Anterior Nasal Swab  Result Value Ref Range Status   SARS Coronavirus 2 by RT PCR NEGATIVE NEGATIVE Final    Comment: (NOTE) SARS-CoV-2 target nucleic acids are NOT DETECTED.  The SARS-CoV-2 RNA is generally detectable in upper respiratory specimens during the acute phase of infection. The lowest concentration of SARS-CoV-2 viral copies this assay can detect is 138 copies/mL. A negative result does not preclude SARS-Cov-2 infection and should not be used as the sole basis for treatment or other patient management decisions. A negative result may occur with  improper specimen collection/handling, submission of specimen other than nasopharyngeal swab, presence of viral mutation(s) within the areas targeted by this assay, and inadequate number of viral copies(<138 copies/mL). A negative result must be combined with clinical observations, patient history, and  epidemiological information. The expected result is Negative.  Fact Sheet for Patients:  BloggerCourse.com  Fact Sheet for Healthcare Providers:  SeriousBroker.it  This test is no t yet approved or cleared by the [place]  FDA and  has been authorized for detection and/or diagnosis of SARS-CoV-2 by FDA under an Emergency Use Authorization (EUA). This EUA will remain  in effect (meaning this test can be used) for the duration of the COVID-19 declaration under Section 564(b)(1) of the Act, 21 U.S.C.section 360bbb-3(b)(1), unless the authorization is terminated  or revoked sooner.       Influenza A by PCR NEGATIVE NEGATIVE Final   Influenza B by PCR NEGATIVE NEGATIVE Final    Comment: (NOTE) The Xpert Xpress SARS-CoV-2/FLU/RSV plus assay is intended as an aid in the diagnosis of influenza from Nasopharyngeal swab specimens and should not be used as a sole basis for treatment. Nasal washings and aspirates are unacceptable for Xpert Xpress SARS-CoV-2/FLU/RSV testing.  Fact Sheet for Patients: BloggerCourse.com  Fact Sheet for Healthcare Providers: SeriousBroker.it  This test is not yet approved  or cleared by the [place]  FDA and has been authorized for detection and/or diagnosis of SARS-CoV-2 by FDA under an Emergency Use Authorization (EUA). This EUA will remain in effect (meaning this test can be used) for the duration of the COVID-19 declaration under Section 564(b)(1) of the Act, 21 U.S.C. section 360bbb-3(b)(1), unless the authorization is terminated or revoked.     Resp Syncytial Virus by PCR NEGATIVE NEGATIVE Final    Comment: (NOTE) Fact Sheet for Patients: BloggerCourse.com  Fact Sheet for Healthcare Providers: SeriousBroker.it  This test is not yet approved or cleared by the [place]  FDA and has been  authorized for detection and/or diagnosis of SARS-CoV-2 by FDA under an Emergency Use Authorization (EUA). This EUA will remain in effect (meaning this test can be used) for the duration of the COVID-19 declaration under Section 564(b)(1) of the Act, 21 U.S.C. section 360bbb-3(b)(1), unless the authorization is terminated or revoked.  Performed at [facility], [address] [place], [place] [address]   Culture, blood (routine x 2)     Status: None   Collection Time: 09/13/23  9:55 AM   Specimen: BLOOD  Result Value Ref Range Status   Specimen Description BLOOD RIGHT ANTECUBITAL  Final   Special Requests   Final    BOTTLES DRAWN AEROBIC AND ANAEROBIC Blood Culture results may not be optimal due to an inadequate volume of blood received in culture bottles   Culture   Final    NO GROWTH 5 DAYS Performed at [facility], [address] [place], [place] [address]    Report Status 09/18/2023 FINAL  Final  Culture, blood (routine x 2)     Status: None   Collection Time: 09/13/23  9:55 AM   Specimen: BLOOD RIGHT ARM  Result Value Ref Range Status   Specimen Description BLOOD RIGHT ARM  Final   Special Requests   Final    BOTTLES DRAWN AEROBIC AND ANAEROBIC Blood Culture results may not be optimal due to an inadequate volume of blood received in culture bottles   Culture   Final    NO GROWTH 5 DAYS Performed at [facility], [address] [place], [place] [address]    Report Status 09/18/2023 FINAL  Final         Radiology Studies: No results found.      Scheduled Meds:  aspirin   81 mg Oral Daily   baclofen   10 mg Oral QID   benzonatate   100 mg Oral TID   chlorproMAZINE   50 mg Oral QID   enoxaparin  (LOVENOX ) injection  40 mg Subcutaneous Q24H   famotidine   20 mg Oral Daily   fluticasone  furoate-vilanterol  1 puff Inhalation Daily   guaiFENesin   600 mg Oral BID   ipratropium-albuterol   3 mL Nebulization BID   lamoTRIgine   150 mg Oral  BID   metoprolol  succinate  25 mg Oral Daily   pantoprazole   40 mg Oral BID   polyethylene glycol  17 g Oral BID   senna  2 tablet Oral QHS   sertraline   25 mg Oral Daily   sodium chloride  flush  3 mL Intravenous Q12H   umeclidinium bromide   1 puff Inhalation Daily   Continuous Infusions:   LOS: 8 days      Alphonsus Jeans, MD [facility] Pager [phone]  If 7PM-7AM, please contact night-coverage [email or web address] 09/21/2023, 8:05 AM

## 2023-09-22 DIAGNOSIS — J9621 Acute and chronic respiratory failure with hypoxia: Secondary | ICD-10-CM | POA: Diagnosis not present

## 2023-09-22 MED ORDER — IPRATROPIUM-ALBUTEROL 0.5-2.5 (3) MG/3ML IN SOLN: 3.0000 mL | Freq: Four times a day (QID) | RESPIRATORY_TRACT | Status: DC | PRN

## 2023-09-22 NOTE — Plan of Care (Signed)
  Problem: Education: Goal: Knowledge of General Education information will improve Description: Including pain rating scale, medication(s)/side effects and non-pharmacologic comfort measures Outcome: Progressing   Problem: Clinical Measurements: Goal: Ability to maintain clinical measurements within normal limits will improve Outcome: Progressing   Problem: Activity: Goal: Risk for activity intolerance will decrease Outcome: Progressing   Problem: Nutrition: Goal: Adequate nutrition will be maintained Outcome: Progressing   Problem: Elimination: Goal: Will not experience complications related to bowel motility Outcome: Progressing Goal: Will not experience complications related to urinary retention Outcome: Progressing   Problem: Pain Managment: Goal: General experience of comfort will improve and/or be controlled Outcome: Progressing   Problem: Safety: Goal: Ability to remain free from injury will improve Outcome: Progressing   Problem: Skin Integrity: Goal: Risk for impaired skin integrity will decrease Outcome: Progressing

## 2023-09-22 NOTE — TOC Progression Note (Signed)
 Transition of Care [facility]) - Progression Note    Patient Details  Name: Jeremiah Nguyen MRN: [id number] Date of Birth: 27-Apr-1956  [facility] CM/SW Contact  Alexandra Ice, RN Phone Number: 09/22/2023, 10:02 AM  Clinical Narrative:    Contacted group home and spoke with Lenette Quick. He stated someone will not be available until tomorrow to come and see patient tomorrow. Explained that patient has been medically ready for discharge for several days and he is just waiting for this visit from the group home to determine if he can return. He confirmed patient has oxygen and RW at group home. Patient refused to use oxygen at group home, which is why he came back to hospital. Updated MD on status of group home visit.         Expected Discharge Plan and Services                                               Social Determinants of Health (SDOH) Interventions SDOH Screenings   Food Insecurity: No Food Insecurity (09/14/2023)  Housing: Low Risk  (09/14/2023)  Transportation Needs: No Transportation Needs (09/14/2023)  Utilities: Not At Risk (09/14/2023)  Depression (PHQ2-9): Low Risk  (02/25/2019)  Financial Resource Strain: Low Risk  (08/27/2023)   Received from [facility]  Physical Activity: Inactive (06/30/2018)  Social Connections: Socially Isolated (09/14/2023)  Stress: No Stress Concern Present (08/27/2023)   Received from [facility]  Tobacco Use: Medium Risk (09/17/2023)    Readmission Risk Interventions     No data to display

## 2023-09-22 NOTE — Progress Notes (Signed)
 PROGRESS NOTE    Jeremiah Nguyen  ZOX:[id number] DOB: Aug 31, 1955 DOA: 09/13/2023 PCP: Karron Pagan, MD   Assessment & Plan:   Principal Problem:   Acute on chronic hypoxic respiratory failure (HCC) Active Problems:   Aspiration pneumonia (HCC)   Bullous emphysema (HCC)   Seizures (HCC)   Abnormal CT scan, esophagus  Assessment and Plan: Acute on chronic hypoxic respiratory failure: likely secondary to aspiration pneumonia. Completed abx course. Continue on bronchodilators & encourage incentive spirometry. Recent prolonged hospitalization for respiratory failure secondary to COPD, influenza & superimposed bacterial pneumonia requiring intubation & ventilation which was d/c to LTAC then SNF. PT/OT recs SNF but pt refuses wants to go back to his group home    Aspiration pneumonia: completed abx course.    Dysphagia: continue on dysphagia II diet   GERD: w/ esophagitis. Continue on H2 blocker, PPI. Recent EGD showed distal esophagitis and a hiatal hernia.    Bullous emphysema: completed steroid course. Continue on bronchodilators & encourage incentive spirometry    Seizures: continue on home dose of lamotrigine     DM2: good controlled, HbA1c 6.0. Diet controlled    HTN: continue on metoprolol     Mood disorder: unknown type or severity. Continue on home dose of sertraline , trazodone           DVT prophylaxis: lovenox   Code Status: full  Family Communication: Disposition Plan: medically stable. Someone from the pt's group home will come evaluate pt tomorrow 09/23/23 and hopefully pt can be d/c back to his group home tomorrow   Level of care: Med-Surg  Status is: Inpatient Remains inpatient appropriate because: medically stable. Someone from the pt's group home will come evaluate pt tomorrow 09/23/23 and hopefully pt can be d/c back to his group home tomorrow    Consultants:    Procedures:   Antimicrobials:    Subjective: Pt c/o fatigue    Objective: Vitals:   09/21/23 1633 09/21/23 1850 09/21/23 2038 09/22/23 0352  BP: (!) 105/56  99/66 106/66  Pulse: 80  95 90  Resp: 16  17 18   Temp: 98.3 F (36.8 C)  98 F (36.7 C) 97.9 F (36.6 C)  TempSrc: Oral     SpO2: 93% 92% 93% 97%  Weight:      Height:        Intake/Output Summary (Last 24 hours) at 09/22/2023 0809 Last data filed at 09/22/2023 0630 Gross per 24 hour  Intake 480 ml  Output 1675 ml  Net -1195 ml   Filed Weights   09/14/23 1259  Weight: 60.8 kg    Examination:  General exam: appears calm & comfortable  Respiratory system: clear breath sounds b/l  Cardiovascular system: S1 & S2+. No rubs or clicks  Gastrointestinal system: abd is soft, NT, ND & hypoactive bowel sounds  Central nervous system: alert & awake. Moves all extremities  Psychiatry: Judgement and insight appears at baseline. Flat mood and affect    Data Reviewed: I have personally reviewed following labs and imaging studies  CBC: Recent Labs  Lab 09/16/23 0559  WBC 9.6  NEUTROABS 6.1  HGB 13.0  HCT 38.6*  MCV 89.4  PLT 241   Basic Metabolic Panel: Recent Labs  Lab 09/16/23 0559 09/17/23 0448  NA 146* 141  K 3.5 3.5  CL 105 105  CO2 30 27  GLUCOSE 88 99  BUN 13 14  CREATININE 1.01 0.91  CALCIUM 9.2 9.4   GFR: Estimated Creatinine Clearance: 66.8 mL/min (by C-G formula based on  SCr of 0.91 mg/dL). Liver Function Tests: No results for input(s): "AST", "ALT", "ALKPHOS", "BILITOT", "PROT", "ALBUMIN" in the last 168 hours.  No results for input(s): "LIPASE", "AMYLASE" in the last 168 hours. No results for input(s): "AMMONIA" in the last 168 hours. Coagulation Profile: No results for input(s): "INR", "PROTIME" in the last 168 hours. Cardiac Enzymes: No results for input(s): "CKTOTAL", "CKMB", "CKMBINDEX", "TROPONINI" in the last 168 hours. BNP (last 3 results) No results for input(s): "PROBNP" in the last 8760 hours. HbA1C: No results for input(s): "HGBA1C" in  the last 72 hours. CBG: No results for input(s): "GLUCAP" in the last 168 hours. Lipid Profile: No results for input(s): "CHOL", "HDL", "LDLCALC", "TRIG", "CHOLHDL", "LDLDIRECT" in the last 72 hours. Thyroid Function Tests: No results for input(s): "TSH", "T4TOTAL", "FREET4", "T3FREE", "THYROIDAB" in the last 72 hours. Anemia Panel: No results for input(s): "VITAMINB12", "FOLATE", "FERRITIN", "TIBC", "IRON", "RETICCTPCT" in the last 72 hours. Sepsis Labs: Recent Labs  Lab 09/16/23 0559  PROCALCITON <0.10    Recent Results (from the past 240 hours)  Resp panel by RT-PCR (RSV, Flu A&B, Covid) Anterior Nasal Swab     Status: None   Collection Time: 09/13/23  9:13 AM   Specimen: Anterior Nasal Swab  Result Value Ref Range Status   SARS Coronavirus 2 by RT PCR NEGATIVE NEGATIVE Final    Comment: (NOTE) SARS-CoV-2 target nucleic acids are NOT DETECTED.  The SARS-CoV-2 RNA is generally detectable in upper respiratory specimens during the acute phase of infection. The lowest concentration of SARS-CoV-2 viral copies this assay can detect is 138 copies/mL. A negative result does not preclude SARS-Cov-2 infection and should not be used as the sole basis for treatment or other patient management decisions. A negative result may occur with  improper specimen collection/handling, submission of specimen other than nasopharyngeal swab, presence of viral mutation(s) within the areas targeted by this assay, and inadequate number of viral copies(<138 copies/mL). A negative result must be combined with clinical observations, patient history, and epidemiological information. The expected result is Negative.  Fact Sheet for Patients:  BloggerCourse.com  Fact Sheet for Healthcare Providers:  SeriousBroker.it  This test is no t yet approved or cleared by the [place]  FDA and  has been authorized for detection and/or diagnosis of SARS-CoV-2  by FDA under an Emergency Use Authorization (EUA). This EUA will remain  in effect (meaning this test can be used) for the duration of the COVID-19 declaration under Section 564(b)(1) of the Act, 21 U.S.C.section 360bbb-3(b)(1), unless the authorization is terminated  or revoked sooner.       Influenza A by PCR NEGATIVE NEGATIVE Final   Influenza B by PCR NEGATIVE NEGATIVE Final    Comment: (NOTE) The Xpert Xpress SARS-CoV-2/FLU/RSV plus assay is intended as an aid in the diagnosis of influenza from Nasopharyngeal swab specimens and should not be used as a sole basis for treatment. Nasal washings and aspirates are unacceptable for Xpert Xpress SARS-CoV-2/FLU/RSV testing.  Fact Sheet for Patients: BloggerCourse.com  Fact Sheet for Healthcare Providers: SeriousBroker.it  This test is not yet approved or cleared by the [place]  FDA and has been authorized for detection and/or diagnosis of SARS-CoV-2 by FDA under an Emergency Use Authorization (EUA). This EUA will remain in effect (meaning this test can be used) for the duration of the COVID-19 declaration under Section 564(b)(1) of the Act, 21 U.S.C. section 360bbb-3(b)(1), unless the authorization is terminated or revoked.     Resp Syncytial Virus by PCR NEGATIVE NEGATIVE  Final    Comment: (NOTE) Fact Sheet for Patients: BloggerCourse.com  Fact Sheet for Healthcare Providers: SeriousBroker.it  This test is not yet approved or cleared by the [place]  FDA and has been authorized for detection and/or diagnosis of SARS-CoV-2 by FDA under an Emergency Use Authorization (EUA). This EUA will remain in effect (meaning this test can be used) for the duration of the COVID-19 declaration under Section 564(b)(1) of the Act, 21 U.S.C. section 360bbb-3(b)(1), unless the authorization is terminated or revoked.  Performed at  [facility], [address] [place], [place] [address]   Culture, blood (routine x 2)     Status: None   Collection Time: 09/13/23  9:55 AM   Specimen: BLOOD  Result Value Ref Range Status   Specimen Description BLOOD RIGHT ANTECUBITAL  Final   Special Requests   Final    BOTTLES DRAWN AEROBIC AND ANAEROBIC Blood Culture results may not be optimal due to an inadequate volume of blood received in culture bottles   Culture   Final    NO GROWTH 5 DAYS Performed at [facility], [address] [place], [place] [address]    Report Status 09/18/2023 FINAL  Final  Culture, blood (routine x 2)     Status: None   Collection Time: 09/13/23  9:55 AM   Specimen: BLOOD RIGHT ARM  Result Value Ref Range Status   Specimen Description BLOOD RIGHT ARM  Final   Special Requests   Final    BOTTLES DRAWN AEROBIC AND ANAEROBIC Blood Culture results may not be optimal due to an inadequate volume of blood received in culture bottles   Culture   Final    NO GROWTH 5 DAYS Performed at [facility], [address] [place], [place] [address]    Report Status 09/18/2023 FINAL  Final         Radiology Studies: No results found.      Scheduled Meds:  aspirin   81 mg Oral Daily   baclofen   10 mg Oral QID   benzonatate   100 mg Oral TID   chlorproMAZINE   50 mg Oral QID   enoxaparin  (LOVENOX ) injection  40 mg Subcutaneous Q24H   famotidine   20 mg Oral Daily   fluticasone  furoate-vilanterol  1 puff Inhalation Daily   guaiFENesin   600 mg Oral BID   lamoTRIgine   150 mg Oral BID   metoprolol  succinate  25 mg Oral Daily   pantoprazole   40 mg Oral BID   polyethylene glycol  17 g Oral BID   senna  2 tablet Oral QHS   sertraline   25 mg Oral Daily   sodium chloride  flush  3 mL Intravenous Q12H   umeclidinium bromide   1 puff Inhalation Daily   Continuous Infusions:   LOS: 9 days      Alphonsus Jeans, MD [facility] Pager [phone]  If 7PM-7AM,  please contact night-coverage [email or web address] 09/22/2023, 8:09 AM

## 2023-09-23 DIAGNOSIS — J9621 Acute and chronic respiratory failure with hypoxia: Secondary | ICD-10-CM | POA: Diagnosis not present

## 2023-09-23 NOTE — Progress Notes (Signed)
 Occupational Therapy Treatment Patient Details Name: Jeremiah Nguyen MRN: [id number] DOB: 10/30/1955 Today's Date: 09/23/2023   History of present illness Pt is a 68 y/o M admitted on  09/13/23 after presenting with c/o SOB. Pt is being treated for acute on chronic hypoxic respiratory failure, aspiration PNA. PMH: COPD, EtOH use, seizure disorder, recent admission for severe hypoxic respiratory failure requiring mechanical ventilation & d/c from LTAC 1 day prior to presenting to ED   OT comments  Jeremiah Nguyen was seen for OT treatment on this date. Upon arrival to room pt in bed, agreeable to tx. Pt requires MIN cues don/doff gown in sitting. IND don/doff B shoes in sitting. SBA + RW for ADL t/f ~160, CGA in room mobility ~20 ft no AD use, noted to furnioture walk. SpO2 91% on 4L Jeremiah Nguyen with activity. Pt making good progress toward goals, will continue to follow POC. Discharge recommendation updated to reflect pt progress.      If plan is discharge home, recommend the following:  Supervision due to cognitive status;Assistance with cooking/housework;A little help with bathing/dressing/bathroom   Equipment Recommendations  None recommended by OT    Recommendations for Other Services      Precautions / Restrictions Precautions Precautions: Fall Recall of Precautions/Restrictions: Impaired Restrictions Weight Bearing Restrictions Per Provider Order: No       Mobility Bed Mobility Overal bed mobility: Modified Independent                  Transfers Overall transfer level: Needs assistance Equipment used: None Transfers: Sit to/from Stand Sit to Stand: Supervision                 Balance Overall balance assessment: Needs assistance Sitting-balance support: No upper extremity supported, Feet supported Sitting balance-Leahy Scale: Good     Standing balance support: No upper extremity supported, During functional activity Standing balance-Leahy Scale: Fair                              ADL either performed or assessed with clinical judgement   ADL Overall ADL's : Needs assistance/impaired                                       General ADL Comments: MIN cues don/doff gown in sitting. IND don/doff B shoes in sitting. SBA + RW for ADL t/f ~160, CGA in room mobility ~20 ft no AD use, noted to furnioture walk.     Communication Communication Communication: No apparent difficulties   Cognition Arousal: Alert Behavior During Therapy: WFL for tasks assessed/performed Cognition: History of cognitive impairments                               Following commands: Intact        Cueing   Cueing Techniques: Verbal cues  Exercises         General Comments SpO2 91% on 4L Jeremiah Nguyen    Pertinent Vitals/ Pain       Pain Assessment Pain Assessment: No/denies pain   Frequency  Min 2X/week        Progress Toward Goals  OT Goals(current goals can now be found in the care plan section)  Progress towards OT goals: Progressing toward goals  Acute Rehab OT Goals OT Goal Formulation: With patient Time  For Goal Achievement: 09/29/23 Potential to Achieve Goals: Fair ADL Goals Pt Will Perform Grooming: with modified independence;standing Pt Will Perform Lower Body Dressing: with modified independence;sit to/from stand Pt Will Transfer to Toilet: with modified independence Pt Will Perform Toileting - Clothing Manipulation and hygiene: with modified independence  Plan      Co-evaluation                 AM-PAC OT "6 Clicks" Daily Activity     Outcome Measure   Help from another person eating meals?: None Help from another person taking care of personal grooming?: A Little Help from another person toileting, which includes using toliet, bedpan, or urinal?: A Little Help from another person bathing (including washing, rinsing, drying)?: A Little Help from another person to put on and taking off regular upper body  clothing?: A Little Help from another person to put on and taking off regular lower body clothing?: None 6 Click Score: 20    End of Session Equipment Utilized During Treatment: Rolling walker (2 wheels)  OT Visit Diagnosis: Unsteadiness on feet (R26.81);Repeated falls (R29.6);Muscle weakness (generalized) (M62.81)   Activity Tolerance Patient tolerated treatment well   Patient Left in bed;with call bell/phone within reach;with bed alarm set (4 rails up per pt request)   Nurse Communication          Time: 1610-9604 OT Time Calculation (min): 17 min  Charges: OT General Charges $OT Visit: 1 Visit OT Treatments $Self Care/Home Management : 8-22 mins  Gordan Latina, M.S. OTR/L  09/23/23, 12:29 PM  ascom [phone]

## 2023-09-23 NOTE — Progress Notes (Signed)
 Physical Therapy Treatment Patient Details Name: Jeremiah Nguyen MRN: [id number] DOB: Jul 02, 1955 Today's Date: 09/23/2023   History of Present Illness Pt is a 68 y/o M admitted on  09/13/23 after presenting with c/o SOB. Pt is being treated for acute on chronic hypoxic respiratory failure, aspiration PNA. PMH: COPD, EtOH use, seizure disorder, recent admission for severe hypoxic respiratory failure requiring mechanical ventilation & d/c from LTAC 1 day prior to presenting to ED    PT Comments  Pt is making good progress towards goals with ability to ambulate around RN station using RW. All mobility performed with O2. Will continue to progress.Pt is hopeful for discharge either today or tomorrow.   If plan is discharge home, recommend the following: A little help with walking and/or transfers;A little help with bathing/dressing/bathroom;Assistance with cooking/housework;Assist for transportation;Help with stairs or ramp for entrance   Can travel by private vehicle     Yes  Equipment Recommendations  Rolling walker (2 wheels)    Recommendations for Other Services       Precautions / Restrictions Precautions Precautions: Fall Recall of Precautions/Restrictions: Impaired Restrictions Weight Bearing Restrictions Per Provider Order: No     Mobility  Bed Mobility Overal bed mobility: Modified Independent Bed Mobility: Supine to Sit, Sit to Supine     Supine to sit: Modified independent (Device/Increase time) Sit to supine: Modified independent (Device/Increase time)   General bed mobility comments: safe technique with upright posture.    Transfers Overall transfer level: Needs assistance Equipment used: Rolling walker (2 wheels) Transfers: Sit to/from Stand Sit to Stand: Supervision           General transfer comment: slightly impulsive and needs cues to wait for therapist. RW used and 4L of O2.    Ambulation/Gait Ambulation/Gait assistance: Contact guard assist Gait  Distance (Feet): 200 Feet Assistive device: Rolling walker (2 wheels) Gait Pattern/deviations: Step-through pattern       General Gait Details: ambulated around RN station with reciprocal gait pattern. Ataxic, however no LOB noted. O2 sats WNL with exertion   Stairs             Wheelchair Mobility     Tilt Bed    Modified Rankin (Stroke Patients Only)       Balance Overall balance assessment: Needs assistance Sitting-balance support: No upper extremity supported, Feet supported Sitting balance-Leahy Scale: Good     Standing balance support: No upper extremity supported, During functional activity Standing balance-Leahy Scale: Fair                              Hotel manager: No apparent difficulties Factors Affecting Communication: Difficulty expressing self  Cognition Arousal: Alert Behavior During Therapy: WFL for tasks assessed/performed   PT - Cognitive impairments: No family/caregiver present to determine baseline                       PT - Cognition Comments: pleasant and agreeable to session Following commands: Intact      Cueing Cueing Techniques: Verbal cues  Exercises      General Comments General comments (skin integrity, edema, etc.): SpO2 91% on 4L [place]      Pertinent Vitals/Pain Pain Assessment Pain Assessment: No/denies pain    Home Living                          Prior Function  PT Goals (current goals can now be found in the care plan section) Acute Rehab PT Goals Patient Stated Goal: none stated PT Goal Formulation: With patient Time For Goal Achievement: 09/29/23 Potential to Achieve Goals: Good Progress towards PT goals: Progressing toward goals    Frequency    Min 2X/week      PT Plan      Co-evaluation              AM-PAC PT "6 Clicks" Mobility   Outcome Measure  Help needed turning from your back to your side while in a flat bed  without using bedrails?: None Help needed moving from lying on your back to sitting on the side of a flat bed without using bedrails?: A Little Help needed moving to and from a bed to a chair (including a wheelchair)?: A Little Help needed standing up from a chair using your arms (e.g., wheelchair or bedside chair)?: A Little Help needed to walk in hospital room?: A Little Help needed climbing 3-5 steps with a railing? : A Little 6 Click Score: 19    End of Session Equipment Utilized During Treatment: Gait belt;Oxygen Activity Tolerance: Patient tolerated treatment well Patient left: in bed;with call bell/phone within reach;with bed alarm set Nurse Communication: Mobility status PT Visit Diagnosis: Ataxic gait (R26.0);Other abnormalities of gait and mobility (R26.89);Muscle weakness (generalized) (M62.81);Unsteadiness on feet (R26.81);Difficulty in walking, not elsewhere classified (R26.2)     Time: 3244-0102 PT Time Calculation (min) (ACUTE ONLY): 9 min  Charges:    $Gait Training: 8-22 mins PT General Charges $$ ACUTE PT VISIT: 1 Visit                     Amparo Balk, PT, DPT, GCS [phone]    Shiraz Bastyr 09/23/2023, 4:08 PM

## 2023-09-23 NOTE — Progress Notes (Addendum)
 PROGRESS NOTE    Jeremiah Nguyen  ZOX:[id number] DOB: Sep 10, 1955 DOA: 09/13/2023 PCP: Karron Pagan, MD   Assessment & Plan:   Principal Problem:   Acute on chronic hypoxic respiratory failure (HCC) Active Problems:   Aspiration pneumonia (HCC)   Bullous emphysema (HCC)   Seizures (HCC)   Abnormal CT scan, esophagus  Assessment and Plan: Acute on chronic hypoxic respiratory failure: likely secondary to aspiration pneumonia. Completed abx course. Continue on bronchodilators & encourage incentive spirometry. Recent prolonged hospitalization for respiratory failure secondary to COPD, influenza & superimposed bacterial pneumonia requiring intubation & ventilation which was d/c to LTAC then SNF. PT recs HH now   Aspiration pneumonia: completed abx course.    Dysphagia: continue on dysphagia II diet   GERD: w/ esophagitis. Continue on PPI, H2 blocker. Recent EGD showed distal esophagitis and a hiatal hernia.    Bullous emphysema: completed steroid course. Continue on bronchodilators & encourage incentive spirometry    Seizures: continue on home dose of lamotrigine     DM2: good controlled, HbA1c 6.0.  Diet controlled   HTN: continue on metoprolol     Mood disorder: unknown type or severity. Continue on home dose of sertraline , trazodone           DVT prophylaxis: lovenox   Code Status: full  Family Communication: Disposition Plan: medically stable >48 hrs. A group home rep came to see pt today but still have yet to have a final decision of whether or not they will take pt back today.    Level of care: Med-Surg  Status is: Inpatient Remains inpatient appropriate because: medically stable >48 hrs. A group home rep came to see pt today 09/23/23 but still have yet to have a final decision of whether or not they will take pt back today.   Consultants:    Procedures:   Antimicrobials:    Subjective: Pt c/o wanting to back to his group home  Objective: Vitals:    09/22/23 1437 09/22/23 2050 09/23/23 0615 09/23/23 0850  BP: (!) 122/59 120/69 133/74 101/60  Pulse: 85  93 100  Resp: 16 18 20 17   Temp: 98.1 F (36.7 C) 97.7 F (36.5 C) 98 F (36.7 C) 97.9 F (36.6 C)  TempSrc:  Oral Oral   SpO2: 90% 94% 94% 93%  Weight:      Height:        Intake/Output Summary (Last 24 hours) at 09/23/2023 0853 Last data filed at 09/23/2023 0600 Gross per 24 hour  Intake 240 ml  Output 1300 ml  Net -1060 ml   Filed Weights   09/14/23 1259  Weight: 60.8 kg    Examination:  General exam: appears comfortable  Respiratory system: clear breath sounds b/l Cardiovascular system: S1/S2+. No rubs or clicks  Gastrointestinal system: abd is soft, NT, ND & hypoactive bowel sounds  Central nervous system: alert & awake. Moves all extremities  Psychiatry: Judgement and insight appears at baseline. Frustrated mood     Data Reviewed: I have personally reviewed following labs and imaging studies  CBC: No results for input(s): "WBC", "NEUTROABS", "HGB", "HCT", "MCV", "PLT" in the last 168 hours.  Basic Metabolic Panel: Recent Labs  Lab 09/17/23 0448  NA 141  K 3.5  CL 105  CO2 27  GLUCOSE 99  BUN 14  CREATININE 0.91  CALCIUM 9.4   GFR: Estimated Creatinine Clearance: 66.8 mL/min (by C-G formula based on SCr of 0.91 mg/dL). Liver Function Tests: No results for input(s): "AST", "ALT", "ALKPHOS", "BILITOT", "PROT", "  ALBUMIN" in the last 168 hours.  No results for input(s): "LIPASE", "AMYLASE" in the last 168 hours. No results for input(s): "AMMONIA" in the last 168 hours. Coagulation Profile: No results for input(s): "INR", "PROTIME" in the last 168 hours. Cardiac Enzymes: No results for input(s): "CKTOTAL", "CKMB", "CKMBINDEX", "TROPONINI" in the last 168 hours. BNP (last 3 results) No results for input(s): "PROBNP" in the last 8760 hours. HbA1C: No results for input(s): "HGBA1C" in the last 72 hours. CBG: No results for input(s): "GLUCAP" in  the last 168 hours. Lipid Profile: No results for input(s): "CHOL", "HDL", "LDLCALC", "TRIG", "CHOLHDL", "LDLDIRECT" in the last 72 hours. Thyroid Function Tests: No results for input(s): "TSH", "T4TOTAL", "FREET4", "T3FREE", "THYROIDAB" in the last 72 hours. Anemia Panel: No results for input(s): "VITAMINB12", "FOLATE", "FERRITIN", "TIBC", "IRON", "RETICCTPCT" in the last 72 hours. Sepsis Labs: No results for input(s): "PROCALCITON", "LATICACIDVEN" in the last 168 hours.   Recent Results (from the past 240 hours)  Resp panel by RT-PCR (RSV, Flu A&B, Covid) Anterior Nasal Swab     Status: None   Collection Time: 09/13/23  9:13 AM   Specimen: Anterior Nasal Swab  Result Value Ref Range Status   SARS Coronavirus 2 by RT PCR NEGATIVE NEGATIVE Final    Comment: (NOTE) SARS-CoV-2 target nucleic acids are NOT DETECTED.  The SARS-CoV-2 RNA is generally detectable in upper respiratory specimens during the acute phase of infection. The lowest concentration of SARS-CoV-2 viral copies this assay can detect is 138 copies/mL. A negative result does not preclude SARS-Cov-2 infection and should not be used as the sole basis for treatment or other patient management decisions. A negative result may occur with  improper specimen collection/handling, submission of specimen other than nasopharyngeal swab, presence of viral mutation(s) within the areas targeted by this assay, and inadequate number of viral copies(<138 copies/mL). A negative result must be combined with clinical observations, patient history, and epidemiological information. The expected result is Negative.  Fact Sheet for Patients:  BloggerCourse.com  Fact Sheet for Healthcare Providers:  SeriousBroker.it  This test is no t yet approved or cleared by the [place]  FDA and  has been authorized for detection and/or diagnosis of SARS-CoV-2 by FDA under an Emergency Use  Authorization (EUA). This EUA will remain  in effect (meaning this test can be used) for the duration of the COVID-19 declaration under Section 564(b)(1) of the Act, 21 U.S.C.section 360bbb-3(b)(1), unless the authorization is terminated  or revoked sooner.       Influenza A by PCR NEGATIVE NEGATIVE Final   Influenza B by PCR NEGATIVE NEGATIVE Final    Comment: (NOTE) The Xpert Xpress SARS-CoV-2/FLU/RSV plus assay is intended as an aid in the diagnosis of influenza from Nasopharyngeal swab specimens and should not be used as a sole basis for treatment. Nasal washings and aspirates are unacceptable for Xpert Xpress SARS-CoV-2/FLU/RSV testing.  Fact Sheet for Patients: BloggerCourse.com  Fact Sheet for Healthcare Providers: SeriousBroker.it  This test is not yet approved or cleared by the [place]  FDA and has been authorized for detection and/or diagnosis of SARS-CoV-2 by FDA under an Emergency Use Authorization (EUA). This EUA will remain in effect (meaning this test can be used) for the duration of the COVID-19 declaration under Section 564(b)(1) of the Act, 21 U.S.C. section 360bbb-3(b)(1), unless the authorization is terminated or revoked.     Resp Syncytial Virus by PCR NEGATIVE NEGATIVE Final    Comment: (NOTE) Fact Sheet for Patients: BloggerCourse.com  Fact Sheet for  Healthcare Providers: SeriousBroker.it  This test is not yet approved or cleared by the [place]  FDA and has been authorized for detection and/or diagnosis of SARS-CoV-2 by FDA under an Emergency Use Authorization (EUA). This EUA will remain in effect (meaning this test can be used) for the duration of the COVID-19 declaration under Section 564(b)(1) of the Act, 21 U.S.C. section 360bbb-3(b)(1), unless the authorization is terminated or revoked.  Performed at [facility], [address] [place], [place] [address]   Culture, blood (routine x 2)     Status: None   Collection Time: 09/13/23  9:55 AM   Specimen: BLOOD  Result Value Ref Range Status   Specimen Description BLOOD RIGHT ANTECUBITAL  Final   Special Requests   Final    BOTTLES DRAWN AEROBIC AND ANAEROBIC Blood Culture results may not be optimal due to an inadequate volume of blood received in culture bottles   Culture   Final    NO GROWTH 5 DAYS Performed at [facility], [address] [place], [place] [address]    Report Status 09/18/2023 FINAL  Final  Culture, blood (routine x 2)     Status: None   Collection Time: 09/13/23  9:55 AM   Specimen: BLOOD RIGHT ARM  Result Value Ref Range Status   Specimen Description BLOOD RIGHT ARM  Final   Special Requests   Final    BOTTLES DRAWN AEROBIC AND ANAEROBIC Blood Culture results may not be optimal due to an inadequate volume of blood received in culture bottles   Culture   Final    NO GROWTH 5 DAYS Performed at [facility], [address], [place], [place] [address]    Report Status 09/18/2023 FINAL  Final         Radiology Studies: No results found.      Scheduled Meds:  aspirin   81 mg Oral Daily   baclofen   10 mg Oral QID   benzonatate   100 mg Oral TID   chlorproMAZINE   50 mg Oral QID   enoxaparin  (LOVENOX ) injection  40 mg Subcutaneous Q24H   famotidine   20 mg Oral Daily   fluticasone  furoate-vilanterol  1 puff Inhalation Daily   guaiFENesin   600 mg Oral BID   lamoTRIgine   150 mg Oral BID   metoprolol  succinate  25 mg Oral Daily   pantoprazole   40 mg Oral BID   polyethylene glycol  17 g Oral BID   senna  2 tablet Oral QHS   sertraline   25 mg Oral Daily   sodium chloride  flush  3 mL Intravenous Q12H   umeclidinium bromide   1 puff Inhalation Daily   Continuous Infusions:   LOS: 10 days      Alphonsus Jeans, MD [facility] Pager [phone]  If 7PM-7AM, please contact  night-coverage [email or web address] 09/23/2023, 8:53 AM

## 2023-09-23 NOTE — TOC Progression Note (Signed)
 Transition of Care [facility]) - Progression Note    Patient Details  Name: Jeremiah Nguyen MRN: [id number] Date of Birth: [birth year]  Transition of Care [facility]) CM/SW Contact  Alexandra Ice, RN Phone Number: 09/23/2023, 1:33 PM  Clinical Narrative:     Attempted to contact Anne Barrios, [phone], to discuss if patient today to group home. No answer, left message on voicemail, requesting call back. Provided TOC contact number.         Expected Discharge Plan and Services                                               Social Determinants of Health (SDOH) Interventions SDOH Screenings   Food Insecurity: No Food Insecurity (09/14/2023)  Housing: Low Risk  (09/14/2023)  Transportation Needs: No Transportation Needs (09/14/2023)  Utilities: Not At Risk (09/14/2023)  Depression (PHQ2-9): Low Risk  (02/25/2019)  Financial Resource Strain: Low Risk  (08/27/2023)   Received from [facility]  Physical Activity: Inactive (06/30/2018)  Social Connections: Socially Isolated (09/14/2023)  Stress: No Stress Concern Present (08/27/2023)   Received from [facility]  Tobacco Use: Medium Risk (09/17/2023)    Readmission Risk Interventions     No data to display

## 2023-09-24 NOTE — Plan of Care (Signed)

## 2023-09-24 NOTE — TOC Progression Note (Signed)
 Transition of Care [facility]) - Progression Note    Patient Details  Name: Jeremiah Nguyen MRN: [id number] Date of Birth: April 05, 1956  Transition of Care [facility]) CM/SW Contact  Alexandra Ice, RN Phone Number: 09/24/2023, 8:43 AM  Clinical Narrative:    Suzy Esters to get update on if patient can return to group home. She stated that she told bedside nurse yesterday that since patient is adamant about continue to smoke, he is not able to return to group home. He will need to find another group home to go home. Notified MD and bedside, TOC will need find another group home for patient to discharge too.         Expected Discharge Plan and Services                                               Social Determinants of Health (SDOH) Interventions SDOH Screenings   Food Insecurity: No Food Insecurity (09/14/2023)  Housing: Low Risk  (09/14/2023)  Transportation Needs: No Transportation Needs (09/14/2023)  Utilities: Not At Risk (09/14/2023)  Depression (PHQ2-9): Low Risk  (02/25/2019)  Financial Resource Strain: Low Risk  (08/27/2023)   Received from [facility]  Physical Activity: Inactive (06/30/2018)  Social Connections: Socially Isolated (09/14/2023)  Stress: No Stress Concern Present (08/27/2023)   Received from [facility]  Tobacco Use: Medium Risk (09/17/2023)    Readmission Risk Interventions     No data to display

## 2023-09-24 NOTE — Progress Notes (Signed)
 PROGRESS NOTE   HPI was taken from Dr. Guss Legacy: Jeremiah Nguyen is a 68 y.o. male with medical history significant of COPD, alcohol use disorder, seizure disorder, recent admission for severe hypoxic respiratory failure requiring mechanical ventilation discharged from LTAC yesterday, who presents to the ED due to shortness of breath.   [name] tells me that he has been experiencing shortness of breath for a long time now and is uncertain if it got worse recently.  He denies any cough, rhinorrhea, congestion, fever, chills, chest pain, palpitations, lower extremity swelling.  When asked if he has been wearing his oxygen since arriving back to the group home, he states yes.   Per chart review, patient returned home from LTAC yesterday.  He was prescribed oxygen as needed but had not been wearing it.  EMS was called today after patient was noted to be short of breath with oxygen saturation of 88% on room air.  He was placed on 3 L with improvement to 94%.   [name] states that he occasionally vomits after he eats, however he cannot recall if he has any choking episodes while he eats or drinks.   Poor historian.   ED course: On arrival to the ED, patient was normotensive at 99/59 with heart rate of 58.  He was saturating at 90% on 3 L.  He was afebrile at 98.3.  Initial workup notable for unremarkable CBC, CMP, VBG, BNP and troponin negative.  Lactic acid within normal limits.  Influenza, RSV and COVID-19 negative.  Chest x-ray with bibasilar opacities.  CT head with no acute findings.  CTA of the chest with interval mild to moderate patchy areas of confluence in by lateral lower lobes concerning for pneumonia, and stable emphysematous changes.  Notable for also diffusely enlarged esophagus with diffuse wall thickening that is stable.  Patient started on Solu-Medrol , vancomycin , azithromycin , cefepime , DuoNebs, and IV fluids.  TR[facility] contacted for admission.   [name]  ZOX:[id number] DOB:  1956/04/25 DOA: 09/13/2023 PCP: Karron Pagan, MD   Assessment & Plan:   Principal Problem:   Acute on chronic hypoxic respiratory failure [facility]) Active Problems:   Aspiration pneumonia (HCC)   Bullous emphysema (HCC)   Seizures (HCC)   Abnormal CT scan, esophagus  Assessment and Plan: Acute on chronic hypoxic respiratory failure: likely secondary to aspiration pneumonia. Completed abx course. Continue on bronchodilators & encourage incentive spirometry. Recent prolonged hospitalization for respiratory failure secondary to COPD, influenza & superimposed bacterial pneumonia requiring intubation & ventilation which was d/c to LTAC then SNF. PT recs HH now   Aspiration pneumonia: completed abx course.    Dysphagia: continue on dysphagia II diet   GERD: w/ esophagitis. Continue on H2 blocker, PPI. Recent EGD showed distal esophagitis and a hiatal hernia.    Bullous emphysema: completed steroid course. Continue on bronchodilators & encourage incentive spirometry     Seizures: continue on home dose of lamotrigine     DM2: good controlled, HbA1c 6.0.  Diet controlled    HTN: continue on metoprolol    Mood disorder: unknown type or severity. Continue on home dose of sertraline , trazodone           DVT prophylaxis: lovenox   Code Status: full  Family Communication: Disposition Plan: medically stable >72 hrs but pt's group refused to take pt back b/c he refuses to quit smoking    Level of care: Med-Surg  Status is: Inpatient Remains inpatient appropriate because: medically stable >72 hrs but pt's group refused to  take pt back b/c he refuses to quit smoking   Consultants:    Procedures:   Antimicrobials:    Subjective: Pt has refused to quit smoking.   Objective: Vitals:   09/23/23 1553 09/23/23 1918 09/24/23 0435 09/24/23 0751  BP: 117/69 (!) 119/59 115/62 91/60  Pulse: (!) 106 88 91 87  Resp: 17 17 17 16   Temp: 98.9 F (37.2 C) 98.7 F (37.1 C) 98.5 F  (36.9 C) 98.4 F (36.9 C)  TempSrc:  Oral Oral Oral  SpO2: 100% 95% 100% 95%  Weight:      Height:        Intake/Output Summary (Last 24 hours) at 09/24/2023 0838 Last data filed at 09/24/2023 0732 Gross per 24 hour  Intake --  Output 2300 ml  Net -2300 ml   Filed Weights   09/14/23 1259  Weight: 60.8 kg    Examination:  General exam: appears calm & comfortable  Respiratory system: decreased breath sounds b/l  Cardiovascular system: S1 & S2+. No rubs or clicks  Gastrointestinal system: abd is soft, NT, ND & hypoactive bowel sounds  Central nervous system: alert & awake. Moves all extremities  Psychiatry: Judgement and insight is poor. Flat mood and affect     Data Reviewed: I have personally reviewed following labs and imaging studies  CBC: No results for input(s): "WBC", "NEUTROABS", "HGB", "HCT", "MCV", "PLT" in the last 168 hours.  Basic Metabolic Panel: No results for input(s): "NA", "K", "CL", "CO2", "GLUCOSE", "BUN", "CREATININE", "CALCIUM", "MG", "PHOS" in the last 168 hours.  GFR: Estimated Creatinine Clearance: 66.8 mL/min (by C-G formula based on SCr of 0.91 mg/dL). Liver Function Tests: No results for input(s): "AST", "ALT", "ALKPHOS", "BILITOT", "PROT", "ALBUMIN" in the last 168 hours.  No results for input(s): "LIPASE", "AMYLASE" in the last 168 hours. No results for input(s): "AMMONIA" in the last 168 hours. Coagulation Profile: No results for input(s): "INR", "PROTIME" in the last 168 hours. Cardiac Enzymes: No results for input(s): "CKTOTAL", "CKMB", "CKMBINDEX", "TROPONINI" in the last 168 hours. BNP (last 3 results) No results for input(s): "PROBNP" in the last 8760 hours. HbA1C: No results for input(s): "HGBA1C" in the last 72 hours. CBG: No results for input(s): "GLUCAP" in the last 168 hours. Lipid Profile: No results for input(s): "CHOL", "HDL", "LDLCALC", "TRIG", "CHOLHDL", "LDLDIRECT" in the last 72 hours. Thyroid Function Tests: No  results for input(s): "TSH", "T4TOTAL", "FREET4", "T3FREE", "THYROIDAB" in the last 72 hours. Anemia Panel: No results for input(s): "VITAMINB12", "FOLATE", "FERRITIN", "TIBC", "IRON", "RETICCTPCT" in the last 72 hours. Sepsis Labs: No results for input(s): "PROCALCITON", "LATICACIDVEN" in the last 168 hours.   No results found for this or any previous visit (from the past 240 hours).        Radiology Studies: No results found.      Scheduled Meds:  aspirin   81 mg Oral Daily   baclofen   10 mg Oral QID   benzonatate   100 mg Oral TID   chlorproMAZINE   50 mg Oral QID   enoxaparin  (LOVENOX ) injection  40 mg Subcutaneous Q24H   famotidine   20 mg Oral Daily   fluticasone  furoate-vilanterol  1 puff Inhalation Daily   guaiFENesin   600 mg Oral BID   lamoTRIgine   150 mg Oral BID   metoprolol  succinate  25 mg Oral Daily   pantoprazole   40 mg Oral BID   polyethylene glycol  17 g Oral BID   senna  2 tablet Oral QHS   sertraline   25 mg  Oral Daily   sodium chloride  flush  3 mL Intravenous Q12H   umeclidinium bromide   1 puff Inhalation Daily   Continuous Infusions:   LOS: 11 days      Alphonsus Jeans, MD [facility] Pager [phone]  If 7PM-7AM, please contact night-coverage [email or web address] 09/24/2023, 8:38 AM

## 2023-09-24 NOTE — Progress Notes (Signed)
 Mobility Specialist - Progress Note   09/24/23 1028  Mobility  Activity Ambulated with assistance in hallway  Level of Assistance Standby assist, set-up cues, supervision of patient - no hands on  Assistive Device Front wheel walker  Distance Ambulated (ft) 160 ft  Activity Response Tolerated well  Mobility visit 1 Mobility  Mobility Specialist Start Time (ACUTE ONLY) 1011  Mobility Specialist Stop Time (ACUTE ONLY) 1023  Mobility Specialist Time Calculation (min) (ACUTE ONLY) 12 min   Pt semi fowler upon entry, utilizing 4L. Pt agreeable to OOB amb this date. Pt completed bed mob ModI (soiled bedding), STS to RW MinG and amb one lap around the NS MinG-SBA--- denied SOB, steady gait. Pt returned to the room, left seated EOB with needs within reach. NT present at bedside.  Versa Gore Mobility Specialist 09/24/23 10:31 AM

## 2023-09-24 NOTE — Plan of Care (Signed)
  Problem: Health Behavior/Discharge Planning: Goal: Ability to manage health-related needs will improve Outcome: Progressing   Problem: Clinical Measurements: Goal: Ability to maintain clinical measurements within normal limits will improve Outcome: Progressing   Problem: Nutrition: Goal: Adequate nutrition will be maintained Outcome: Progressing   Problem: Elimination: Goal: Will not experience complications related to urinary retention Outcome: Progressing

## 2023-09-25 DIAGNOSIS — J9621 Acute and chronic respiratory failure with hypoxia: Secondary | ICD-10-CM | POA: Diagnosis not present

## 2023-09-25 LAB — BASIC METABOLIC PANEL WITH GFR
Anion gap: 8 (ref 5–15)
BUN: 19 mg/dL (ref 8–23)
CO2: 28 mmol/L (ref 22–32)
Calcium: 9.7 mg/dL (ref 8.9–10.3)
Chloride: 101 mmol/L (ref 98–111)
Creatinine, Ser: 1.01 mg/dL (ref 0.61–1.24)
GFR, Estimated: >60 mL/min (ref >60)
Glucose, Bld: 100 mg/dL — ABNORMAL HIGH (ref 70–99)
Potassium: 4 mmol/L (ref 3.5–5.1)
Sodium: 137 mmol/L (ref 135–145)

## 2023-09-25 LAB — CBC
HCT: 42.1 % (ref 39.0–52.0)
Hemoglobin: 13.6 g/dL (ref 13.0–17.0)
MCH: 29.8 pg (ref 26.0–34.0)
MCHC: 32.3 g/dL (ref 30.0–36.0)
MCV: 92.1 fL (ref 80.0–100.0)
Platelets: 292 10*3/uL (ref 150–400)
RBC: 4.57 MIL/uL (ref 4.22–5.81)
RDW: 15.9 % — ABNORMAL HIGH (ref 11.5–15.5)
WBC: 5.8 10*3/uL (ref 4.0–10.5)
nRBC: 0 % (ref 0.0–0.2)

## 2023-09-25 NOTE — Plan of Care (Signed)
   Problem: Health Behavior/Discharge Planning: Goal: Ability to manage health-related needs will improve Outcome: Progressing   Problem: Clinical Measurements: Goal: Will remain free from infection Outcome: Progressing

## 2023-09-25 NOTE — Discharge Summary (Addendum)
 [facility] Discharge Summary   [name] [id number]:[id number]  PCP: Karron Pagan, MD  Date of admission: 09/13/2023   Date of discharge:  09/26/2023     Discharge Diagnoses:  Principal Problem:   Acute on chronic hypoxic respiratory failure (HCC) Active Problems:   Aspiration pneumonia (HCC)   Bullous emphysema (HCC)   Seizures (HCC)   Abnormal CT scan, esophagus   Admitted From: Group home Disposition: Group home  Recommendations for Outpatient Follow-up:  PCP: in 1 wk Follow up LABS/TEST:  CXR in 4 weeks   Diet recommendation: Cardiac and Carb modified diet  Activity: The patient is advised to gradually reintroduce usual activities, as tolerated  Discharge Condition: stable  Code Status: Full code   History of present illness: As per the H and P dictated on admission  Hospital Course:    HPI was taken from Dr. Guss Legacy: Tari Fare is a 68 y.o. male with medical history significant of COPD, alcohol use disorder, seizure disorder, recent admission for severe hypoxic respiratory failure requiring mechanical ventilation discharged from LTAC yesterday, who presents to the ED due to shortness of breath.   Mr. Cheung tells me that he has been experiencing shortness of breath for a long time now and is uncertain if it got worse recently.  He denies any cough, rhinorrhea, congestion, fever, chills, chest pain, palpitations, lower extremity swelling.  When asked if he has been wearing his oxygen since arriving back to the group home, he states yes.   Per chart review, patient returned home from LTAC yesterday.  He was prescribed oxygen as needed but had not been wearing it.  EMS was called today after patient was noted to be short of breath with oxygen saturation of 88% on room air.  He was placed on 3 L with improvement to 94%.   Mr. Hollett states that he occasionally vomits after he eats, however he cannot recall if he has any choking episodes while he  eats or drinks.   Poor historian.   ED course: On arrival to the ED, patient was normotensive at 99/59 with heart rate of 58.  He was saturating at 90% on 3 L.  He was afebrile at 98.3.  Initial workup notable for unremarkable CBC, CMP, VBG, BNP and troponin negative.  Lactic acid within normal limits.  Influenza, RSV and COVID-19 negative.  Chest x-ray with bibasilar opacities.  CT head with no acute findings.  CTA of the chest with interval mild to moderate patchy areas of confluence in by lateral lower lobes concerning for pneumonia, and stable emphysematous changes.  Notable for also diffusely enlarged esophagus with diffuse wall thickening that is stable.  Patient started on Solu-Medrol , vancomycin , azithromycin , cefepime , DuoNebs, and IV fluids.  [facility] contacted for admission.       Assessment and Plan:  # Acute on chronic hypoxic respiratory failure: likely secondary to aspiration pneumonia. Completed abx course. Continue on bronchodilators & encourage incentive spirometry. Recent prolonged hospitalization for respiratory failure secondary to COPD, influenza & superimposed bacterial pneumonia requiring intubation & ventilation which was d/c to LTAC then SNF. PT recs HH now    # Aspiration pneumonia: completed abx course.  # Dysphagia: continue on dysphagia II diet  # GERD: w/ esophagitis. Continue on H2 blocker, PPI. Recent EGD showed distal esophagitis and a hiatal hernia.  # Bullous emphysema: completed steroid course. Continue on bronchodilators & encourage incentive spirometry   # Seizures: continue on home dose of lamotrigine   #  DM2: good controlled, HbA1c 6.0.  Diet controlled  # HTN: continue on metoprolol  # Mood disorder: unknown type or severity. Continue on home dose of sertraline , trazodone   # Nicotine  dependence, patient smokes cigarettes.  Smoking cessation counseling done and patient understanding, he is willing to quit smoking.   Body mass index is 21.63 kg/m.  Nutrition  Interventions:  - Patient was instructed, not to drive, operate heavy machinery, perform activities at heights, swimming or participation in water  activities or provide baby sitting services while on Pain, Sleep and Anxiety Medications; until his outpatient Physician has advised to do so again.  - Also recommended to not to take more than prescribed Pain, Sleep and Anxiety Medications.  Patient was seen by physical therapy, who recommended Home health, which was arranged. On the day of the discharge the patient's vitals were stable, and no other acute medical condition were reported by patient. the patient was felt safe to be discharge at group home with Home health.  Consultants: GI Procedures: s/p EGD  Discharge Exam: General: Appear in no distress, no Rash; Oral Mucosa Clear, moist. Cardiovascular: S1 and S2 Present, no Murmur, Respiratory: normal respiratory effort, Bilateral Air entry present and no Crackles, no wheezes Abdomen: Bowel Sound present, Soft and no tenderness, no hernia Extremities: no Pedal edema, no calf tenderness Neurology: alert and oriented to time, place, and person affect appropriate.  Filed Weights   09/14/23 1259  Weight: 60.8 kg   Vitals:   09/26/23 0425 09/26/23 0819  BP: (!) 112/59 112/71  Pulse: 86 84  Resp: 17 17  Temp: (!) 97.2 F (36.2 C) 97.6 F (36.4 C)  SpO2: 95% 95%    DISCHARGE MEDICATION: Allergies as of 09/26/2023   No Known Allergies      Medication List     STOP taking these medications    ondansetron  4 MG/2ML Soln injection Commonly known as: ZOFRAN    Symbicort 80-4.5 MCG/ACT inhaler Generic drug: budesonide -formoterol       TAKE these medications    acetaminophen  325 MG tablet Commonly known as: TYLENOL  Take 2 tablets (650 mg total) by mouth every 4 (four) hours as needed for mild pain (pain score 1-3), moderate pain (pain score 4-6), fever or headache.   albuterol  (2.5 MG/3ML) 0.083% nebulizer solution Commonly  known as: PROVENTIL  Take 3 mLs (2.5 mg total) by nebulization every 4 (four) hours as needed for shortness of breath or wheezing.   ascorbic acid 500 MG tablet Commonly known as: VITAMIN C Take 500 mg by mouth daily.   aspirin  81 MG chewable tablet Chew by mouth daily.   baclofen  10 MG tablet Commonly known as: LIORESAL  TAKE ONE TABLET BY MOUTH FOUR TIMES A DAY   benzonatate  100 MG capsule Commonly known as: TESSALON  Take 1 capsule (100 mg total) by mouth 3 (three) times daily.   chlorproMAZINE  50 MG tablet Commonly known as: THORAZINE  TAKE ONE TABLET BY MOUTH FOUR TIMES A DAY   empagliflozin 25 MG Tabs tablet Commonly known as: JARDIANCE Take 25 mg by mouth daily.   famotidine  20 MG tablet Commonly known as: PEPCID  Take 20 mg by mouth daily.   feeding supplement Liqd Take 237 mLs by mouth 3 (three) times daily between meals.   ferrous sulfate  325 (65 FE) MG EC tablet Take 1 tablet (325 mg total) by mouth every other day.   fluticasone  furoate-vilanterol 100-25 MCG/ACT Aepb Commonly known as: BREO ELLIPTA  Inhale 1 puff into the lungs daily.   folic acid  1 MG  tablet Commonly known as: FOLVITE  Take 1 mg by mouth daily.   guaiFENesin  100 MG/5ML liquid Commonly known as: ROBITUSSIN Take 5 mLs by mouth every 4 (four) hours as needed for cough or to loosen phlegm.   ipratropium-albuterol  0.5-2.5 (3) MG/3ML Soln Commonly known as: DUONEB Take 3 mLs by nebulization 3 (three) times daily.   lamoTRIgine  150 MG tablet Commonly known as: LAMICTAL  Take 150 mg by mouth 2 (two) times daily.   metoprolol  succinate 25 MG 24 hr tablet Commonly known as: TOPROL -XL Take 1 tablet (25 mg total) by mouth daily.   mouth rinse Liqd solution 15 mLs by Mouth Rinse route as needed (for oral care).   multivitamin with minerals Tabs tablet Take 1 tablet by mouth daily.   nicotine  14 mg/24hr patch Commonly known as: NICODERM CQ  - dosed in mg/24 hours Place 1 patch (14 mg total)  onto the skin daily.   nystatin  100000 UNIT/ML suspension Commonly known as: MYCOSTATIN  Take 5 mLs (500,000 Units total) by mouth 4 (four) times daily.   omeprazole  40 MG capsule Commonly known as: PRILOSEC Take 1 capsule (40 mg total) by mouth 2 (two) times daily with a meal.   polyethylene glycol 17 g packet Commonly known as: MIRALAX  / GLYCOLAX  Take 17 g by mouth 2 (two) times daily.   senna 8.6 MG Tabs tablet Commonly known as: SENOKOT Take 2 tablets (17.2 mg total) by mouth at bedtime.   sertraline  25 MG tablet Commonly known as: ZOLOFT  Take 1 tablet (25 mg total) by mouth daily.   thiamine  100 MG tablet Commonly known as: Vitamin B-1 Take 1 tablet (100 mg total) by mouth daily.   traZODone  50 MG tablet Commonly known as: DESYREL  Take 1 tablet (50 mg total) by mouth at bedtime as needed for sleep.   umeclidinium bromide  62.5 MCG/ACT Aepb Commonly known as: INCRUSE ELLIPTA  Inhale 1 puff into the lungs daily.   Vitamin D3 1.25 MG (50000 UT) Caps Take 50,000 Units by mouth once a week.       No Known Allergies Discharge Instructions     Call MD for:  difficulty breathing, headache or visual disturbances   Complete by: As directed    Call MD for:  extreme fatigue   Complete by: As directed    Call MD for:  persistant dizziness or light-headedness   Complete by: As directed    Call MD for:  persistant nausea and vomiting   Complete by: As directed    Call MD for:  severe uncontrolled pain   Complete by: As directed    Call MD for:  temperature >100.4   Complete by: As directed    Diet - low sodium heart healthy   Complete by: As directed    Discharge instructions   Complete by: As directed    Follow-up with PCP   Face-to-face encounter (required for Medicare/Medicaid patients)   Complete by: As directed    I Althia Atlas certify that this patient is under my care and that I, or a nurse practitioner or physician's assistant working with me, had a  face-to-face encounter that meets the physician face-to-face encounter requirements with this patient on 09/25/2023. The encounter with the patient was in whole, or in part for the following medical condition(s) which is the primary reason for home health care (List medical condition): Physical debility and risk of fall   The encounter with the patient was in whole, or in part, for the following medical condition, which is  the primary reason for home health care: Physical debility and risk of fall   I certify that, based on my findings, the following services are medically necessary home health services: Physical therapy   Reason for Medically Necessary Home Health Services: Therapy- Investment banker, operational, Patent examiner   My clinical findings support the need for the above services: Pain interferes with ambulation/mobility   Further, I certify that my clinical findings support that this patient is homebound due to: Unable to leave home safely without assistance   Home Health   Complete by: As directed    To provide the following care/treatments: PT   Increase activity slowly   Complete by: As directed        The results of significant diagnostics from this hospitalization (including imaging, microbiology, ancillary and laboratory) are listed below for reference.    Significant Diagnostic Studies: CT Angio Chest PE W/Cm &/Or Wo Cm Result Date: 09/13/2023 CLINICAL DATA:  Shortness of breath and hypoxia. Increased shortness of breath today. Productive cough for the past 24 hours. No subjective fever. COPD. Ex-smoker. EXAM: CT ANGIOGRAPHY CHEST WITH CONTRAST TECHNIQUE: Multidetector CT imaging of the chest was performed using the standard protocol during bolus administration of intravenous contrast. Multiplanar CT image reconstructions and MIPs were obtained to evaluate the vascular anatomy. RADIATION DOSE REDUCTION: This exam was performed according to the departmental dose-optimization  program which includes automated exposure control, adjustment of the mA and/or kV according to patient size and/or use of iterative reconstruction technique. CONTRAST:  75mL OMNIPAQUE  IOHEXOL  350 MG/ML SOLN COMPARISON:  Chest radiographs dated 08/11/2023 and 08/05/2023. Chest CTA dated 07/29/2023. FINDINGS: Cardiovascular: Atheromatous calcifications, including the coronary arteries and aorta. Normally opacified pulmonary arteries with no pulmonary arterial filling defects seen. Enlarged heart. No pericardial effusion. Mediastinum/Nodes: Stable diffusely enlarged esophagus with diffuse, concentric wall thickening. Unremarkable thyroid gland and trachea. Interval minimally enlarged right hilar nodes with short axis diameters of 10.4 mm on image number 74/4 9.5 mm on image number 76/4. No other enlarged lymph nodes. Lungs/Pleura: Extensive bilateral centrilobular bullous changes are again demonstrated. A large paraseptal bulla in the medial right middle lobe with adjacent compressive atelectasis or scarring is not changed significantly. Interval mild-to-moderate patchy, and small confluent densities are demonstrated in both lower lobes. No pleural fluid. The previously demonstrated stable 4 mm right apical nodule is less dense and does not have the appearance of the nodule today. Upper Abdomen: No acute abnormality. Musculoskeletal: Minimal thoracic and marked lower cervical spine degenerative changes. Review of the MIP images confirms the above findings. IMPRESSION: 1. No pulmonary embolus. 2. Interval mild-to-moderate patchy, and small areas of confluence densities in both lower lobes, most consistent with pneumonia. This could also represent an atypical appearance of atelectasis given the degree of emphysema the patient has. 3. Interval minimally enlarged right hilar nodes, most likely reactive. 4. Stable extensive bilateral centrilobular bullous changes. 5. Stable large paraseptal bulla in the medial right middle  lobe with adjacent compressive atelectasis or scarring. 6. Stable diffusely enlarged esophagus with diffuse, concentric wall thickening. This is most consistent with chronic reflux esophagitis. 7. Cardiomegaly. 8.  Calcific coronary artery and aortic atherosclerosis. Aortic Atherosclerosis (ICD10-I70.0) and Emphysema (ICD10-J43.9). Electronically Signed   By: Catherin Closs M.D.   On: 09/13/2023 16:56   CT Head Wo Contrast Result Date: 09/13/2023 CLINICAL DATA:  Altered mental status. EXAM: CT HEAD WITHOUT CONTRAST TECHNIQUE: Contiguous axial images were obtained from the base of the skull through the vertex without  intravenous contrast. RADIATION DOSE REDUCTION: This exam was performed according to the departmental dose-optimization program which includes automated exposure control, adjustment of the mA and/or kV according to patient size and/or use of iterative reconstruction technique. COMPARISON:  Head CT dated 07/26/2022. FINDINGS: Brain: Mild age-related atrophy and chronic microvascular ischemic changes. There is no acute intracranial hemorrhage. No mass effect or midline shift. No extra-axial fluid collection. Vascular: No hyperdense vessel or unexpected calcification. Skull: Normal. Negative for fracture or focal lesion. Sinuses/Orbits: Mild mucoperiosteal thickening of paranasal sinuses. No air-fluid level. The mastoid air cells are clear. Other: None IMPRESSION: 1. No acute intracranial pathology. 2. Mild age-related atrophy and chronic microvascular ischemic changes. Electronically Signed   By: Angus Bark M.D.   On: 09/13/2023 14:10   DG Chest Portable 1 View Result Date: 09/13/2023 CLINICAL DATA:  Shortness of breath with exertion. EXAM: PORTABLE CHEST 1 VIEW COMPARISON:  08/11/2023 FINDINGS: Stable cardiomediastinal contours. Aortic atherosclerosis. Chronic changes of COPD/emphysema. Bibasilar opacities within both lung bases may reflect atelectasis or infiltrate. No significant pleural  effusion or pneumothorax. Visualized osseous structures appear normal. IMPRESSION: 1. Bibasilar opacities may reflect atelectasis or infiltrate. 2. Chronic changes of COPD/emphysema. Electronically Signed   By: Kimberley Penman M.D.   On: 09/13/2023 09:05    Microbiology: No results found for this or any previous visit (from the past 240 hours).   Labs: CBC: Recent Labs  Lab 09/25/23 0413  WBC 5.8  HGB 13.6  HCT 42.1  MCV 92.1  PLT 292   Basic Metabolic Panel: Recent Labs  Lab 09/25/23 0413  NA 137  K 4.0  CL 101  CO2 28  GLUCOSE 100*  BUN 19  CREATININE 1.01  CALCIUM 9.7   Liver Function Tests: No results for input(s): "AST", "ALT", "ALKPHOS", "BILITOT", "PROT", "ALBUMIN" in the last 168 hours. No results for input(s): "LIPASE", "AMYLASE" in the last 168 hours. No results for input(s): "AMMONIA" in the last 168 hours. Cardiac Enzymes: No results for input(s): "CKTOTAL", "CKMB", "CKMBINDEX", "TROPONINI" in the last 168 hours. BNP (last 3 results) Recent Labs    07/28/23 0901 09/13/23 0954  BNP 32.8 23.5   CBG: No results for input(s): "GLUCAP" in the last 168 hours.  Time spent: 35 minutes  Signed:  Althia Atlas  [facility] 09/26/2023 10:50 AM

## 2023-09-25 NOTE — TOC Progression Note (Signed)
 Transition of Care [facility]) - Progression Note    Patient Details  Name: Jeremiah Nguyen MRN: [id number] Date of Birth: Sep 13, 1955  Transition of Care [facility]) CM/SW Contact  Alexandra Ice, RN Phone Number: 09/25/2023, 3:31 PM  Clinical Narrative:     Attempted to contact Anne Barrios to get update on pick up time. Received voicemail left message requesting call back.        Expected Discharge Plan and Services         Expected Discharge Date: 09/25/23                                     Social Determinants of Health (SDOH) Interventions SDOH Screenings   Food Insecurity: No Food Insecurity (09/14/2023)  Housing: Low Risk  (09/14/2023)  Transportation Needs: No Transportation Needs (09/14/2023)  Utilities: Not At Risk (09/14/2023)  Depression (PHQ2-9): Low Risk  (02/25/2019)  Financial Resource Strain: Low Risk  (08/27/2023)   Received from [facility]  Physical Activity: Inactive (06/30/2018)  Social Connections: Socially Isolated (09/14/2023)  Stress: No Stress Concern Present (08/27/2023)   Received from [facility]  Tobacco Use: Medium Risk (09/17/2023)    Readmission Risk Interventions     No data to display

## 2023-09-25 NOTE — NC FL2 (Signed)
 [place]  MEDICAID FL2 LEVEL OF CARE FORM     IDENTIFICATION  Patient Name: Jeremiah Nguyen Birthdate: 1956-03-02 Sex: male Admission Date (Current Location): 09/13/2023  [place] and [place] Number:  Chiropodist and Address:  [facility], [address] [place], [place] [address]      Provider Number: [id number]  Attending Physician Name and Address:  Althia Atlas, MD  Relative Name and Phone Number:  Pearlene Bouchard, sister, [phone]    Current Level of Care: Hospital Recommended Level of Care: Other (Comment) ([facility],) Prior Approval Number:    Date Approved/Denied:   PASRR Number: [id number]  Discharge Plan: Other (Comment) (ALF, Group Home)    Current Diagnoses: Patient Active Problem List   Diagnosis Date Noted   Abnormal CT scan, esophagus 09/16/2023   Acute on chronic hypoxic respiratory failure (HCC) 09/13/2023   Bullous emphysema (HCC) 09/13/2023   Aspiration pneumonia (HCC) 08/05/2023   Malnutrition of moderate degree 07/15/2023   Acute respiratory failure with hypoxia and hypercapnia (HCC) 07/12/2023   Abnormal iron saturation 11/12/2022   Pain due to onychomycosis of toenails of both feet 05/23/2020   High risk medication use 11/12/2019   Seizure-like activity (HCC) 11/09/2019   Abnormal brain MRI 07/27/2019   Eating disorder 07/22/2019   Mild protein-calorie malnutrition (HCC) 06/03/2019   Alcohol use disorder, severe, in sustained remission (HCC) 06/03/2019   Person living in residential institution 06/02/2018   Problems related to living in residential institution 06/02/2018   Liver disease, chronic 01/07/2018   Seizures (HCC) 06/10/2017   Gastroesophageal reflux disease 06/10/2017   Tobacco abuse 11/25/2014    Orientation RESPIRATION BLADDER Height & Weight     Self, Time, Situation, Place  Normal, O2 (2LPM via [place] continuous) Continent Weight: 60.8 kg Height:  5\' 6"  (167.6 cm)  BEHAVIORAL  SYMPTOMS/MOOD NEUROLOGICAL BOWEL NUTRITION STATUS      Continent Diet (Dysphagia 2 diet, thin liquid)  AMBULATORY STATUS COMMUNICATION OF NEEDS Skin   Limited Assist Verbally Normal                       Personal Care Assistance Level of Assistance  Bathing, Feeding, Dressing Bathing Assistance: Limited assistance Feeding assistance: Independent Dressing Assistance: Independent     Functional Limitations Info             SPECIAL CARE FACTORS FREQUENCY                       Contractures Contractures Info: Not present    Additional Factors Info  Code Status, Allergies Code Status Info: Full Code Allergies Info: NKDA           Current Medications (09/25/2023):  This is the current hospital active medication list Current Facility-Administered Medications  Medication Dose Route Frequency Provider Last Rate Last Admin   acetaminophen  (TYLENOL ) tablet 650 mg  650 mg Oral Q6H PRN Marnee Sink, MD       Or   acetaminophen  (TYLENOL ) suppository 650 mg  650 mg Rectal Q6H PRN Marnee Sink, MD       aspirin  chewable tablet 81 mg  81 mg Oral Daily Marnee Sink, MD   81 mg at 09/25/23 2725   baclofen  (LIORESAL ) tablet 10 mg  10 mg Oral QID Marnee Sink, MD   10 mg at 09/25/23 3664   benzonatate  (TESSALON ) capsule 100 mg  100 mg Oral TID Marnee Sink, MD   100 mg at  09/25/23 0943   chlorproMAZINE  (THORAZINE ) tablet 50 mg  50 mg Oral QID Marnee Sink, MD   50 mg at 09/25/23 9147   enoxaparin  (LOVENOX ) injection 40 mg  40 mg Subcutaneous Q24H Marnee Sink, MD   40 mg at 09/24/23 2144   famotidine  (PEPCID ) tablet 20 mg  20 mg Oral Daily Marnee Sink, MD   20 mg at 09/25/23 8295   fluticasone  furoate-vilanterol (BREO ELLIPTA ) 100-25 MCG/ACT 1 puff  1 puff Inhalation Daily Marnee Sink, MD   1 puff at 09/25/23 0818   guaiFENesin  (MUCINEX ) 12 hr tablet 600 mg  600 mg Oral BID Marnee Sink, MD   600 mg at 09/25/23 6213   ipratropium-albuterol  (DUONEB) 0.5-2.5 (3) MG/3ML nebulizer  solution 3 mL  3 mL Nebulization Q6H PRN Alphonsus Jeans, MD       lamoTRIgine  (LAMICTAL ) tablet 150 mg  150 mg Oral BID Marnee Sink, MD   150 mg at 09/25/23 0865   metoprolol  succinate (TOPROL -XL) 24 hr tablet 25 mg  25 mg Oral Daily Marnee Sink, MD   25 mg at 09/25/23 7846   ondansetron  (ZOFRAN ) tablet 4 mg  4 mg Oral Q6H PRN Marnee Sink, MD       Or   ondansetron  (ZOFRAN ) injection 4 mg  4 mg Intravenous Q6H PRN Marnee Sink, MD       pantoprazole  (PROTONIX ) EC tablet 40 mg  40 mg Oral BID Wohl, Darren, MD   40 mg at 09/25/23 0943   polyethylene glycol (MIRALAX  / GLYCOLAX ) packet 17 g  17 g Oral Daily PRN Marnee Sink, MD       polyethylene glycol (MIRALAX  / GLYCOLAX ) packet 17 g  17 g Oral BID Marnee Sink, MD   17 g at 09/25/23 9629   senna (SENOKOT) tablet 17.2 mg  2 tablet Oral QHS Marnee Sink, MD   17.2 mg at 09/24/23 2142   sertraline  (ZOLOFT ) tablet 25 mg  25 mg Oral Daily Wohl, Darren, MD   25 mg at 09/25/23 5284   sodium chloride  flush (NS) 0.9 % injection 3 mL  3 mL Intravenous Q12H Marnee Sink, MD   3 mL at 09/22/23 1324   traZODone  (DESYREL ) tablet 50 mg  50 mg Oral QHS PRN Marnee Sink, MD       umeclidinium bromide  (INCRUSE ELLIPTA ) 62.5 MCG/ACT 1 puff  1 puff Inhalation Daily Marnee Sink, MD   1 puff at 09/25/23 0818     Discharge Medications: Please see discharge summary for a list of discharge medications.  Relevant Imaging Results:  Relevant Lab Results:   Additional Information [id number]  Alexandra Ice, RN

## 2023-09-25 NOTE — Plan of Care (Signed)
  Problem: Education: Goal: Knowledge of General Education information will improve Description: Including pain rating scale, medication(s)/side effects and non-pharmacologic comfort measures 09/25/2023 1457 by Abraham Abo D, LPN Outcome: Progressing 09/25/2023 1457 by Abraham Abo D, LPN Outcome: Progressing   Problem: Activity: Goal: Risk for activity intolerance will decrease Outcome: Progressing   Problem: Safety: Goal: Ability to remain free from injury will improve Outcome: Progressing   Problem: Skin Integrity: Goal: Risk for impaired skin integrity will decrease Outcome: Progressing

## 2023-09-25 NOTE — Plan of Care (Signed)
  Problem: Education: Goal: Knowledge of General Education information will improve Description: Including pain rating scale, medication(s)/side effects and non-pharmacologic comfort measures 09/25/2023 1500 by Abraham Abo D, LPN Outcome: Progressing 09/25/2023 1457 by Abraham Abo D, LPN Outcome: Progressing 09/25/2023 1457 by Abraham Abo D, LPN Outcome: Progressing   Problem: Activity: Goal: Risk for activity intolerance will decrease 09/25/2023 1500 by Abraham Abo D, LPN Outcome: Progressing 09/25/2023 1457 by Abraham Abo D, LPN Outcome: Progressing   Problem: Nutrition: Goal: Adequate nutrition will be maintained Outcome: Progressing   Problem: Safety: Goal: Ability to remain free from injury will improve Outcome: Progressing   Problem: Skin Integrity: Goal: Risk for impaired skin integrity will decrease Outcome: Progressing

## 2023-09-25 NOTE — TOC Progression Note (Signed)
 Transition of Care [facility]) - Progression Note    Patient Details  Name: Jeremiah Nguyen MRN: [id number] Date of Birth: 10-25-1955  Transition of Care [facility]) CM/SW Contact  Alexandra Ice, RN Phone Number: 09/25/2023, 12:12 PM  Clinical Narrative:    Received call from Anne Barrios, from group home. They are will to accept patient back as he has agreed to stop smoking. They are needing updated FL2, H&P, and progress note faxed to them. Notified MD, and waiting for FL2 to be signed.         Expected Discharge Plan and Services                                               Social Determinants of Health (SDOH) Interventions SDOH Screenings   Food Insecurity: No Food Insecurity (09/14/2023)  Housing: Low Risk  (09/14/2023)  Transportation Needs: No Transportation Needs (09/14/2023)  Utilities: Not At Risk (09/14/2023)  Depression (PHQ2-9): Low Risk  (02/25/2019)  Financial Resource Strain: Low Risk  (08/27/2023)   Received from [facility]  Physical Activity: Inactive (06/30/2018)  Social Connections: Socially Isolated (09/14/2023)  Stress: No Stress Concern Present (08/27/2023)   Received from [facility]  Tobacco Use: Medium Risk (09/17/2023)    Readmission Risk Interventions     No data to display

## 2023-09-26 DIAGNOSIS — J9621 Acute and chronic respiratory failure with hypoxia: Secondary | ICD-10-CM | POA: Diagnosis not present

## 2023-09-26 NOTE — Progress Notes (Signed)
 Transport here to pick up patient, however, O2 tank is empty, transport going to pick up another tank for patient.

## 2023-09-26 NOTE — Progress Notes (Signed)
 Mobility Specialist - Progress Note  Post-mobility: HR 112, SPO2 92%   09/26/23 1443  Mobility  Activity Ambulated with assistance in hallway;Ambulated with assistance to bathroom  Level of Assistance Standby assist, set-up cues, supervision of patient - no hands on  Assistive Device Front wheel walker  Distance Ambulated (ft) 160 ft  Activity Response Tolerated well  Mobility visit 1 Mobility  Mobility Specialist Start Time (ACUTE ONLY) 1428  Mobility Specialist Stop Time (ACUTE ONLY) 1442  Mobility Specialist Time Calculation (min) (ACUTE ONLY) 14 min   Pt sitting EOB upon entry, utilizing 4L. Pt agreeable to OOB amb this date. Pt STS to RW MinG and amb one lap around the NS with SBA, tolerated well. Pt returned to the room, amb to/from the bathroom and returned EOB. Pt left seated with alarm set and needs within reach.  Versa Gore Mobility Specialist 09/26/23 2:46 PM

## 2023-09-26 NOTE — TOC Progression Note (Signed)
 Transition of Care [facility]) - Progression Note    Patient Details  Name: Jeremiah Nguyen MRN: [id number] Date of Birth: Oct 29, 1955  Transition of Care [facility]) CM/SW Contact  Alexandra Ice, RN Phone Number: 09/26/2023, 10:07 AM  Clinical Narrative:     Received call back from Edwina Gram, stated she will call group home and get a pick up time for patient. She requested discharge orders be faxed to [facility], fax number, [phone].  Notified MD and provided update on discharge for patient.  Awaiting call back on pick up time for patient.        Expected Discharge Plan and Services         Expected Discharge Date: 09/25/23                                     Social Determinants of Health (SDOH) Interventions SDOH Screenings   Food Insecurity: No Food Insecurity (09/14/2023)  Housing: Low Risk  (09/14/2023)  Transportation Needs: No Transportation Needs (09/14/2023)  Utilities: Not At Risk (09/14/2023)  Depression (PHQ2-9): Low Risk  (02/25/2019)  Financial Resource Strain: Low Risk  (08/27/2023)   Received from [facility]  Physical Activity: Inactive (06/30/2018)  Social Connections: Socially Isolated (09/14/2023)  Stress: No Stress Concern Present (08/27/2023)   Received from [facility]  Tobacco Use: Medium Risk (09/17/2023)    Readmission Risk Interventions     No data to display

## 2023-09-26 NOTE — Progress Notes (Signed)
 PROGRESS NOTE   HPI was taken from Dr. Guss Legacy: Jeremiah Nguyen is a 68 y.o. male with medical history significant of COPD, alcohol use disorder, seizure disorder, recent admission for severe hypoxic respiratory failure requiring mechanical ventilation discharged from LTAC yesterday, who presents to the ED due to shortness of breath.   Jeremiah Nguyen tells me that he has been experiencing shortness of breath for a long time now and is uncertain if it got worse recently.  He denies any cough, rhinorrhea, congestion, fever, chills, chest pain, palpitations, lower extremity swelling.  When asked if he has been wearing his oxygen since arriving back to the group home, he states yes.   Per chart review, patient returned home from LTAC yesterday.  He was prescribed oxygen as needed but had not been wearing it.  EMS was called today after patient was noted to be short of breath with oxygen saturation of 88% on room air.  He was placed on 3 L with improvement to 94%.   Jeremiah Nguyen states that he occasionally vomits after he eats, however he cannot recall if he has any choking episodes while he eats or drinks.   Poor historian.   ED course: On arrival to the ED, patient was normotensive at 99/59 with heart rate of 58.  He was saturating at 90% on 3 L.  He was afebrile at 98.3.  Initial workup notable for unremarkable CBC, CMP, VBG, BNP and troponin negative.  Lactic acid within normal limits.  Influenza, RSV and COVID-19 negative.  Chest x-ray with bibasilar opacities.  CT head with no acute findings.  CTA of the chest with interval mild to moderate patchy areas of confluence in by lateral lower lobes concerning for pneumonia, and stable emphysematous changes.  Notable for also diffusely enlarged esophagus with diffuse wall thickening that is stable.  Patient started on Solu-Medrol , vancomycin , azithromycin , cefepime , DuoNebs, and IV fluids.  TRH contacted for admission.   Jeremiah Nguyen  MVH:[id number] DOB:  [birth year] DOA: 09/13/2023 PCP: Karron Pagan, MD   Assessment & Plan:   Principal Problem:   Acute on chronic hypoxic respiratory failure [facility]) Active Problems:   Aspiration pneumonia (HCC)   Bullous emphysema (HCC)   Seizures (HCC)   Abnormal CT scan, esophagus  Assessment and Plan: Acute on chronic hypoxic respiratory failure: likely secondary to aspiration pneumonia. Completed abx course. Continue on bronchodilators & encourage incentive spirometry. Recent prolonged hospitalization for respiratory failure secondary to COPD, influenza & superimposed bacterial pneumonia requiring intubation & ventilation which was d/c to LTAC then SNF. PT recs HH now   Aspiration pneumonia: completed abx course.    Dysphagia: continue on dysphagia II diet   GERD: w/ esophagitis. Continue on H2 blocker, PPI. Recent EGD showed distal esophagitis and a hiatal hernia.    Bullous emphysema: completed steroid course. Continue on bronchodilators & encourage incentive spirometry     Seizures: continue on home dose of lamotrigine     DM2: good controlled, HbA1c 6.0.  Diet controlled    HTN: continue on metoprolol    Mood disorder: unknown type or severity. Continue on home dose of sertraline , trazodone           DVT prophylaxis: lovenox   Code Status: full  Family Communication: Disposition Plan: medically stable >72 hrs but pt's group refused to take pt back b/c he refuses to quit smoking    Level of care: Med-Surg  Status is: Inpatient Remains inpatient appropriate because: medically stable >72 hrs but pt's group refused to  take pt back b/c he refuses to quit smoking   Consultants:    Procedures:   Antimicrobials:    Subjective: Pt has refused to quit smoking.   Objective: Vitals:   09/25/23 1719 09/25/23 1957 09/26/23 0425 09/26/23 0819  BP: (!) 120/58 121/72 (!) 112/59 112/71  Pulse: 85 86 86 84  Resp: 17 18 17 17   Temp: 98 F (36.7 C) 99.4 F (37.4 C) (!) 97.2  F (36.2 C) 97.6 F (36.4 C)  TempSrc:      SpO2: 94% 100% 95% 95%  Weight:      Height:        Intake/Output Summary (Last 24 hours) at 09/26/2023 0910 Last data filed at 09/26/2023 4098 Gross per 24 hour  Intake 240 ml  Output 2100 ml  Net -1860 ml   Filed Weights   09/14/23 1259  Weight: 60.8 kg    Examination:  General exam: appears calm & comfortable  Respiratory system: decreased breath sounds b/l  Cardiovascular system: S1 & S2+. No rubs or clicks  Gastrointestinal system: abd is soft, NT, ND & hypoactive bowel sounds  Central nervous system: alert & awake. Moves all extremities  Psychiatry: Judgement and insight is poor. Flat mood and affect     Data Reviewed: I have personally reviewed following labs and imaging studies  CBC: Recent Labs  Lab 09/25/23 0413  WBC 5.8  HGB 13.6  HCT 42.1  MCV 92.1  PLT 292    Basic Metabolic Panel: Recent Labs  Lab 09/25/23 0413  NA 137  K 4.0  CL 101  CO2 28  GLUCOSE 100*  BUN 19  CREATININE 1.01  CALCIUM 9.7    GFR: Estimated Creatinine Clearance: 60.2 mL/min (by C-G formula based on SCr of 1.01 mg/dL). Liver Function Tests: No results for input(s): "AST", "ALT", "ALKPHOS", "BILITOT", "PROT", "ALBUMIN" in the last 168 hours.  No results for input(s): "LIPASE", "AMYLASE" in the last 168 hours. No results for input(s): "AMMONIA" in the last 168 hours. Coagulation Profile: No results for input(s): "INR", "PROTIME" in the last 168 hours. Cardiac Enzymes: No results for input(s): "CKTOTAL", "CKMB", "CKMBINDEX", "TROPONINI" in the last 168 hours. BNP (last 3 results) No results for input(s): "PROBNP" in the last 8760 hours. HbA1C: No results for input(s): "HGBA1C" in the last 72 hours. CBG: No results for input(s): "GLUCAP" in the last 168 hours. Lipid Profile: No results for input(s): "CHOL", "HDL", "LDLCALC", "TRIG", "CHOLHDL", "LDLDIRECT" in the last 72 hours. Thyroid Function Tests: No results for  input(s): "TSH", "T4TOTAL", "FREET4", "T3FREE", "THYROIDAB" in the last 72 hours. Anemia Panel: No results for input(s): "VITAMINB12", "FOLATE", "FERRITIN", "TIBC", "IRON", "RETICCTPCT" in the last 72 hours. Sepsis Labs: No results for input(s): "PROCALCITON", "LATICACIDVEN" in the last 168 hours.   No results found for this or any previous visit (from the past 240 hours).        Radiology Studies: No results found.      Scheduled Meds:  aspirin   81 mg Oral Daily   baclofen   10 mg Oral QID   benzonatate   100 mg Oral TID   chlorproMAZINE   50 mg Oral QID   enoxaparin  (LOVENOX ) injection  40 mg Subcutaneous Q24H   famotidine   20 mg Oral Daily   fluticasone  furoate-vilanterol  1 puff Inhalation Daily   guaiFENesin   600 mg Oral BID   lamoTRIgine   150 mg Oral BID   metoprolol  succinate  25 mg Oral Daily   pantoprazole   40 mg Oral BID  polyethylene glycol  17 g Oral BID   senna  2 tablet Oral QHS   sertraline   25 mg Oral Daily   sodium chloride  flush  3 mL Intravenous Q12H   umeclidinium bromide   1 puff Inhalation Daily   Continuous Infusions:   LOS: 13 days      Althia Atlas, MD [facility] Pager [phone]  If 7PM-7AM, please contact night-coverage [email or web address] 09/26/2023, 9:10 AM

## 2023-09-26 NOTE — TOC Progression Note (Signed)
 Transition of Care [facility]) - Progression Note    Patient Details  Name: Jeremiah Nguyen MRN: [id number] Date of Birth: 1955/09/13  Transition of Care [facility]) CM/SW Contact  Alexandra Ice, RN Phone Number: 09/26/2023, 9:02 AM  Clinical Narrative:     Attempted to contacted Anne Barrios, [phone], inquiring about discharge yesterday. No answer, left message. Provided contact information.  Attempted to call group home, [phone], inquiring about discharge. No answer, voice mail not set up unable to leave message.        Expected Discharge Plan and Services         Expected Discharge Date: 09/25/23                                     Social Determinants of Health (SDOH) Interventions SDOH Screenings   Food Insecurity: No Food Insecurity (09/14/2023)  Housing: Low Risk  (09/14/2023)  Transportation Needs: No Transportation Needs (09/14/2023)  Utilities: Not At Risk (09/14/2023)  Depression (PHQ2-9): Low Risk  (02/25/2019)  Financial Resource Strain: Low Risk  (08/27/2023)   Received from [facility]  Physical Activity: Inactive (06/30/2018)  Social Connections: Socially Isolated (09/14/2023)  Stress: No Stress Concern Present (08/27/2023)   Received from [facility]  Tobacco Use: Medium Risk (09/17/2023)    Readmission Risk Interventions     No data to display

## 2023-09-26 NOTE — Discharge Instructions (Signed)
 [facility].  [address] , [place], [place], [address]. [phone] They will call you to arrange when they can come to see you

## 2023-09-26 NOTE — TOC Transition Note (Signed)
 Transition of Care [facility]) - Discharge Note   Patient Details  Name: Jeremiah Nguyen MRN: [id number] Date of Birth: 05/19/1956  Transition of Care [facility]) CM/SW Contact:  Alexandra Ice, RN Phone Number: 09/26/2023, 12:21 PM   Clinical Narrative:    Patient to return to group home. Spoke with Anne Barrios, confirmed they will pick up today. Driver will pick up tank and then will come to [facility] to get patient. TOC let her know discharge orders were faxed to [facility] per her request. Also let her know patient is ready. Discharge summary and orders have been in since yesterday.    Final next level of care: Home w Home Health Services Barriers to Discharge: Barriers Resolved   Patient Goals and CMS Choice            Discharge Placement                  Name of family member notified: Group home Patient and family notified of of transfer: 09/26/23  Discharge Plan and Services Additional resources added to the After Visit Summary for                    DME Agency: NA       HH Arranged: PT, OT HH Agency: [facility] Date [facility] Agency Contacted: 09/26/23 Time HH Agency Contacted: 1221 Representative spoke with at [facility] Agency: [place]   Social Drivers of Health (SDOH) Interventions SDOH Screenings   Food Insecurity: No Food Insecurity (09/14/2023)  Housing: Low Risk  (09/14/2023)  Transportation Needs: No Transportation Needs (09/14/2023)  Utilities: Not At Risk (09/14/2023)  Depression (PHQ2-9): Low Risk  (02/25/2019)  Financial Resource Strain: Low Risk  (08/27/2023)   Received from [facility]  Physical Activity: Inactive (06/30/2018)  Social Connections: Socially Isolated (09/14/2023)  Stress: No Stress Concern Present (08/27/2023)   Received from [facility]  Tobacco Use: Medium Risk (09/17/2023)     Readmission Risk Interventions     No data to display

## 2023-11-19 ENCOUNTER — Telehealth: Payer: Self-pay

## 2023-11-19 ENCOUNTER — Other Ambulatory Visit: Payer: Self-pay

## 2023-11-19 DIAGNOSIS — Z1211 Encounter for screening for malignant neoplasm of colon: Secondary | ICD-10-CM

## 2023-11-19 MED ORDER — NA SULFATE-K SULFATE-MG SULF 17.5-3.13-1.6 GM/177ML PO SOLN: 1.0000 | Freq: Once | ORAL | 0 refills | Status: AC

## 2023-11-19 NOTE — Telephone Encounter (Signed)
 Gastroenterology Pre-Procedure Review  Request Date: 02/04/24 Requesting Physician: Dr. Jinny  Pt gave permission to speak with Elijah (Pharmacy tech) and Spurgeon Level (owner of assisted living) to help schedule his procedure.  Jeremiah Nguyen's # [phone]  PATIENT REVIEW QUESTIONS: The patient responded to the following health history questions as indicated:    1. Are you having any GI issues? no 2. Do you have a personal history of Polyps? no 3. Do you have a family history of Colon Cancer or Polyps? Last colonoscopy attempted in 2022 by dr locklear but unsuccessful due to prep 4. Diabetes Mellitus? yes (takes jardiance has been advised to stop 3 days prior to colonoscopy) 5. Joint replacements in the past 12 months?no 6. Major health problems in the past 3 months?no 7. Any artificial heart valves, MVP, or defibrillator?no    MEDICATIONS & ALLERGIES:    Patient reports the following regarding taking any anticoagulation/antiplatelet therapy:   Plavix, Coumadin, Eliquis, Xarelto, Lovenox , Pradaxa, Brilinta, or Effient? no Aspirin ? no  Patient confirms/reports the following medications:  Current Outpatient Medications  Medication Sig Dispense Refill   Na Sulfate-K Sulfate-Mg Sulfate concentrate (SUPREP) 17.5-3.13-1.6 GM/177ML SOLN Take 1 kit (354 mLs total) by mouth once for 1 dose. 354 mL 0   acetaminophen  (TYLENOL ) 325 MG tablet Take 2 tablets (650 mg total) by mouth every 4 (four) hours as needed for mild pain (pain score 1-3), moderate pain (pain score 4-6), fever or headache.     albuterol  (PROVENTIL ) (2.5 MG/3ML) 0.083% nebulizer solution Take 3 mLs (2.5 mg total) by nebulization every 4 (four) hours as needed for shortness of breath or wheezing.     ascorbic acid (VITAMIN C) 500 MG tablet Take 500 mg by mouth daily.     aspirin  81 MG chewable tablet Chew by mouth daily.     baclofen  (LIORESAL ) 10 MG tablet TAKE ONE TABLET BY MOUTH FOUR TIMES A DAY 120 each 0   benzonatate  (TESSALON ) 100  MG capsule Take 1 capsule (100 mg total) by mouth 3 (three) times daily.     chlorproMAZINE  (THORAZINE ) 50 MG tablet TAKE ONE TABLET BY MOUTH FOUR TIMES A DAY 120 tablet 0   Cholecalciferol (VITAMIN D3) 1.25 MG (50000 UT) CAPS Take 50,000 Units by mouth once a week.     empagliflozin (JARDIANCE) 25 MG TABS tablet Take 25 mg by mouth daily.     famotidine  (PEPCID ) 20 MG tablet Take 20 mg by mouth daily.     feeding supplement (ENSURE ENLIVE / ENSURE PLUS) LIQD Take 237 mLs by mouth 3 (three) times daily between meals.     ferrous sulfate  325 (65 FE) MG EC tablet Take 1 tablet (325 mg total) by mouth every other day. 45 tablet 0   fluticasone  furoate-vilanterol (BREO ELLIPTA ) 100-25 MCG/ACT AEPB Inhale 1 puff into the lungs daily.     folic acid  (FOLVITE ) 1 MG tablet Take 1 mg by mouth daily.     guaiFENesin  (ROBITUSSIN) 100 MG/5ML liquid Take 5 mLs by mouth every 4 (four) hours as needed for cough or to loosen phlegm.     ipratropium-albuterol  (DUONEB) 0.5-2.5 (3) MG/3ML SOLN Take 3 mLs by nebulization 3 (three) times daily.     lamoTRIgine  (LAMICTAL ) 150 MG tablet Take 150 mg by mouth 2 (two) times daily.     metoprolol  succinate (TOPROL -XL) 25 MG 24 hr tablet Take 1 tablet (25 mg total) by mouth daily.     Mouthwashes (MOUTH RINSE) LIQD solution 15 mLs by Mouth Rinse route as  needed (for oral care).     Multiple Vitamin (MULTIVITAMIN WITH MINERALS) TABS tablet Take 1 tablet by mouth daily.     nicotine  (NICODERM CQ  - DOSED IN MG/24 HOURS) 14 mg/24hr patch Place 1 patch (14 mg total) onto the skin daily. 28 patch 0   nystatin  (MYCOSTATIN ) 100000 UNIT/ML suspension Take 5 mLs (500,000 Units total) by mouth 4 (four) times daily. 60 mL 0   omeprazole  (PRILOSEC) 40 MG capsule Take 1 capsule (40 mg total) by mouth 2 (two) times daily with a meal.     polyethylene glycol (MIRALAX  / GLYCOLAX ) 17 g packet Take 17 g by mouth 2 (two) times daily. 14 each 0   senna (SENOKOT) 8.6 MG TABS tablet Take 2 tablets  (17.2 mg total) by mouth at bedtime. 120 tablet 0   sertraline  (ZOLOFT ) 25 MG tablet Take 1 tablet (25 mg total) by mouth daily. 30 tablet 1   thiamine  (VITAMIN B-1) 100 MG tablet Take 1 tablet (100 mg total) by mouth daily.     traZODone  (DESYREL ) 50 MG tablet Take 1 tablet (50 mg total) by mouth at bedtime as needed for sleep.     umeclidinium bromide  (INCRUSE ELLIPTA ) 62.5 MCG/ACT AEPB Inhale 1 puff into the lungs daily.     No current facility-administered medications for this visit.    Patient confirms/reports the following allergies:  No Known Allergies  No orders of the defined types were placed in this encounter.   AUTHORIZATION INFORMATION Primary Insurance: 1D#: Group #:  Secondary Insurance: 1D#: Group #:  SCHEDULE INFORMATION: Date: 02/04/24 Time: Location: [facility]

## 2023-12-06 ENCOUNTER — Encounter: Payer: Self-pay | Admitting: Advanced Practice Midwife

## 2024-02-04 ENCOUNTER — Encounter: Payer: Self-pay | Admitting: Gastroenterology

## 2024-02-04 ENCOUNTER — Encounter: Payer: Self-pay | Admitting: Anesthesiology

## 2024-02-04 ENCOUNTER — Encounter
Admission: RE | Disposition: A | Discharge status: Home / Self Care | Payer: Self-pay | Source: Home / Self Care | Attending: Gastroenterology

## 2024-02-04 ENCOUNTER — Ambulatory Visit
Admission: RE | Admit: 2024-02-04 | Discharge: 2024-02-04 | Disposition: A | Discharge status: Home / Self Care | Attending: Gastroenterology | Admitting: Gastroenterology

## 2024-02-04 DIAGNOSIS — Z1211 Encounter for screening for malignant neoplasm of colon: Secondary | ICD-10-CM | POA: Diagnosis present

## 2024-02-04 DIAGNOSIS — Z539 Procedure and treatment not carried out, unspecified reason: Secondary | ICD-10-CM | POA: Diagnosis not present

## 2024-02-04 SURGERY — COLONOSCOPY
Anesthesia: General

## 2024-02-04 MED ORDER — SODIUM CHLORIDE 0.9 % IV SOLN: INTRAVENOUS | Status: DC

## 2024-02-04 NOTE — Anesthesia Preprocedure Evaluation (Signed)
 Anesthesia Evaluation  Patient identified by MRN, date of birth, ID band Patient awake    Reviewed: Allergy & Precautions, NPO status , Patient's Chart, lab work & pertinent test results  History of Anesthesia Complications Negative for: history of anesthetic complications  Airway Mallampati: III  TM Distance: >3 FB Neck ROM: full    Dental  (+) Chipped   Pulmonary sleep apnea , COPD,  COPD inhaler, former smoker   Pulmonary exam normal        Cardiovascular hypertension, Normal cardiovascular exam     Neuro/Psych Seizures -, Well Controlled,   negative psych ROS   GI/Hepatic ,GERD  ,,(+)     substance abuse  alcohol use  Endo/Other  negative endocrine ROS    Renal/GU negative Renal ROS  negative genitourinary   Musculoskeletal   Abdominal   Peds  Hematology negative hematology ROS (+)   Anesthesia Other Findings Past Medical History: 07/31/2023: Acute metabolic encephalopathy 07/31/2023: AKI (acute kidney injury) (HCC) No date: COPD (chronic obstructive pulmonary disease) (HCC) No date: Eating disorder 08/04/2023: Epistaxis No date: GERD (gastroesophageal reflux disease) 07/31/2023: Influenza A with pneumonia No date: Liver disease 08/05/2023: Multifocal pneumonia No date: Seizures (HCC) 11/25/2014: Septic shock (HCC) No date: Sleep apnea  Past Surgical History: 12/05/2020: COLONOSCOPY WITH PROPOFOL ; N/A     Comment:  Procedure: COLONOSCOPY WITH PROPOFOL ;  Surgeon:               Maryruth Ole DASEN, MD;  Location: [facility] ENDOSCOPY;                Service: Endoscopy;  Laterality: N/A; 09/17/2023: ESOPHAGOGASTRODUODENOSCOPY; N/A     Comment:  Procedure: EGD (ESOPHAGOGASTRODUODENOSCOPY);  Surgeon:               Jinny Carmine, MD;  Location: [facility] ENDOSCOPY;  Service:               Endoscopy;  Laterality: N/A; 02/25/2018: ESOPHAGOGASTRODUODENOSCOPY (EGD) WITH PROPOFOL ; N/A     Comment:  Procedure:  ESOPHAGOGASTRODUODENOSCOPY (EGD) WITH               PROPOFOL ;  Surgeon: Therisa Bi, MD;  Location[facility]               ENDOSCOPY;  Service: Gastroenterology;  Laterality: N/A; 12-15-13: LESION EXCISION; Left     Comment:  follicular cyst No date: SKIN BIOPSY     Reproductive/Obstetrics negative OB ROS                              Anesthesia Physical Anesthesia Plan  ASA: 3  Anesthesia Plan: General   Post-op Pain Management: Minimal or no pain anticipated   Induction: Intravenous  PONV Risk Score and Plan: 1 and Propofol  infusion and TIVA  Airway Management Planned: Natural Airway and Nasal Cannula  Additional Equipment:   Intra-op Plan:   Post-operative Plan:   Informed Consent: I have reviewed the patients History and Physical, chart, labs and discussed the procedure including the risks, benefits and alternatives for the proposed anesthesia with the patient or authorized representative who has indicated his/her understanding and acceptance.     Dental Advisory Given  Plan Discussed with: Anesthesiologist, CRNA and Surgeon  Anesthesia Plan Comments: (Patient consented for risks of anesthesia including but not limited to:  - adverse reactions to medications - risk of airway placement if required - damage to eyes, teeth, lips or other oral mucosa - nerve damage due  to positioning  - sore throat or hoarseness - Damage to heart, brain, nerves, lungs, other parts of body or loss of life  Patient voiced understanding and assent.)         Anesthesia Quick Evaluation

## 2024-02-04 NOTE — OR Nursing (Signed)
 Mr. Jeremiah Nguyen reported liquid brown stool. Chaperone Mr. Jeremiah Nguyen called facility to see if any staff had visualized the color of his stool and staff did not. Dr. Jinny was notified and it has been decided to reschedule procedure. The appropriate color of output was discussed with [name] and Mr. Jeremiah Nguyen.

## 2024-06-12 ENCOUNTER — Encounter: Payer: Self-pay | Admitting: Podiatry

## 2024-06-12 ENCOUNTER — Ambulatory Visit: Admitting: Podiatry

## 2024-06-12 DIAGNOSIS — Q828 Other specified congenital malformations of skin: Secondary | ICD-10-CM | POA: Diagnosis not present

## 2024-06-12 DIAGNOSIS — B351 Tinea unguium: Secondary | ICD-10-CM

## 2024-06-12 DIAGNOSIS — I739 Peripheral vascular disease, unspecified: Secondary | ICD-10-CM

## 2024-06-12 DIAGNOSIS — M79674 Pain in right toe(s): Secondary | ICD-10-CM | POA: Diagnosis not present

## 2024-06-12 DIAGNOSIS — M79675 Pain in left toe(s): Secondary | ICD-10-CM | POA: Diagnosis not present

## 2024-06-16 NOTE — Progress Notes (Signed)
"  °  Subjective:  Patient ID: Jeremiah Nguyen, male    DOB: [birth year],  MRN: [id number]  Jeremiah Nguyen presents to clinic today for at risk foot care. Patient has h/o PAD and painful porokeratotic lesion(s) right lower extremity and painful mycotic toenails that limit ambulation. Painful toenails interfere with ambulation. Aggravating factors include wearing enclosed shoe gear. Pain is relieved with periodic professional debridement. Painful porokeratotic lesions are aggravated when weightbearing with and without shoegear. Pain is relieved with periodic professional debridement.  Chief Complaint  Patient presents with   Nail Problem     RFC; toenails curving under. He saw Dr Jeremiah on Monday.   New problem(s): None.   PCP is Jeremiah Corean Pollen, MD.  Allergies[1]  Review of Systems: Negative except as noted in the HPI.  Objective: No changes noted in today's physical examination. There were no vitals filed for this visit. Jeremiah Nguyen is a pleasant 69 y.o. male WD, WN in NAD. AAO x 3.  Vascular Examination: CFT <4 seconds b/l. DP pulses diminished b/l. PT pulses diminished b/l. Digital hair absent. Skin temperature gradient warm to cool b/l. No ischemia or gangrene. No cyanosis or clubbing noted b/l.    Neurological Examination: Sensation grossly intact b/l with 10 gram monofilament. Vibratory sensation intact b/l.   Dermatological Examination: Pedal skin thin, shiny and atrophic b/l. No open wounds. No interdigital macerations.   Toenails 1-5 b/l thick, discolored, elongated with subungual debris and pain on dorsal palpation.   Porokeratotic lesion(s) plantar heel pad of right foot and R 2nd toe. No erythema, no edema, no drainage, no fluctuance.  Musculoskeletal Examination: Muscle strength 5/5 to all lower extremity muscle groups bilaterally. HAV with bunion deformity noted b/l LE. Hammertoe(s) R 2nd toe.  Radiographs: None  Last A1c:      Latest Ref Rng & Units  07/13/2023    4:46 AM  Hemoglobin A1C  Hemoglobin-A1c 4.8 - 5.6 % 6.0    Assessment/Plan: 1. Pain due to onychomycosis of toenails of both feet   2. Porokeratosis   3. PAD (peripheral artery disease)   Consent given for treatment. Patient examined. All patient's and/or POA's questions/concerns addressed on today's visit. Mycotic toenails 1-5 b/l debrided in length and girth without incident. Porokeratotic lesion(s) right heel and dorsal PIPJ of R 2nd toe pared and enucleated with sharp debridement without incident.Continue soft, supportive shoe gear daily. Report any pedal injuries to medical professional. Call office if there are any quesitons/concerns. -Patient/POA to call should there be question/concern in the interim.   Return in about 3 months (around 09/10/2024).  Jeremiah Nguyen, DPM      [facility] LOCATION: [address] [place] [address]                   Office [phone]   [facility] LOCATION: [address] [place], [place] [address] Office [phone]     [1] No Known Allergies  "

## 2024-09-10 ENCOUNTER — Ambulatory Visit: Admitting: Podiatry
